# Patient Record
Sex: Male | Born: 1945 | Race: White | Hispanic: No | Marital: Married | Smoking: Former smoker
Health system: Southern US, Community
[De-identification: ages and names within clinical notes are randomized; demographics above are authoritative.]

## PROBLEM LIST (undated history)

## (undated) DIAGNOSIS — I700 Atherosclerosis of aorta: Secondary | ICD-10-CM

## (undated) DIAGNOSIS — I452 Bifascicular block: Secondary | ICD-10-CM

## (undated) DIAGNOSIS — I10 Essential (primary) hypertension: Secondary | ICD-10-CM

## (undated) DIAGNOSIS — Z83518 Family history of other specified eye disorder: Secondary | ICD-10-CM

## (undated) DIAGNOSIS — L719 Rosacea, unspecified: Secondary | ICD-10-CM

## (undated) DIAGNOSIS — Z7982 Long term (current) use of aspirin: Secondary | ICD-10-CM

## (undated) DIAGNOSIS — N529 Male erectile dysfunction, unspecified: Secondary | ICD-10-CM

## (undated) DIAGNOSIS — I4510 Unspecified right bundle-branch block: Secondary | ICD-10-CM

## (undated) DIAGNOSIS — I2510 Atherosclerotic heart disease of native coronary artery without angina pectoris: Secondary | ICD-10-CM

## (undated) DIAGNOSIS — D472 Monoclonal gammopathy: Secondary | ICD-10-CM

## (undated) DIAGNOSIS — N184 Chronic kidney disease, stage 4 (severe): Secondary | ICD-10-CM

## (undated) DIAGNOSIS — I444 Left anterior fascicular block: Secondary | ICD-10-CM

## (undated) DIAGNOSIS — E7800 Pure hypercholesterolemia, unspecified: Secondary | ICD-10-CM

## (undated) DIAGNOSIS — I209 Angina pectoris, unspecified: Secondary | ICD-10-CM

## (undated) DIAGNOSIS — I723 Aneurysm of iliac artery: Secondary | ICD-10-CM

## (undated) DIAGNOSIS — D631 Anemia in chronic kidney disease: Secondary | ICD-10-CM

## (undated) DIAGNOSIS — C679 Malignant neoplasm of bladder, unspecified: Secondary | ICD-10-CM

## (undated) DIAGNOSIS — Z9841 Cataract extraction status, right eye: Secondary | ICD-10-CM

## (undated) DIAGNOSIS — N400 Enlarged prostate without lower urinary tract symptoms: Secondary | ICD-10-CM

## (undated) DIAGNOSIS — N281 Cyst of kidney, acquired: Secondary | ICD-10-CM

## (undated) DIAGNOSIS — I7143 Infrarenal abdominal aortic aneurysm, without rupture: Secondary | ICD-10-CM

## (undated) DIAGNOSIS — D649 Anemia, unspecified: Secondary | ICD-10-CM

## (undated) DIAGNOSIS — I720 Aneurysm of carotid artery: Secondary | ICD-10-CM

## (undated) DIAGNOSIS — C61 Malignant neoplasm of prostate: Secondary | ICD-10-CM

## (undated) DIAGNOSIS — E785 Hyperlipidemia, unspecified: Secondary | ICD-10-CM

## (undated) HISTORY — PX: CORONARY ANGIOPLASTY: SHX604

## (undated) HISTORY — PX: TONSILLECTOMY: SUR1361

## (undated) HISTORY — PX: CARDIAC CATHETERIZATION: SHX172

## (undated) HISTORY — PX: COLONOSCOPY W/ POLYPECTOMY: SHX1380

## (undated) HISTORY — PX: TRANSURETHRAL RESECTION OF PROSTATE: SHX73

## (undated) HISTORY — DX: Bifascicular block: I45.2

## (undated) HISTORY — PX: EYE SURGERY: SHX253

## (undated) HISTORY — PX: OTHER SURGICAL HISTORY: SHX169

---
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
## 2002-04-13 DIAGNOSIS — Z951 Presence of aortocoronary bypass graft: Secondary | ICD-10-CM

## 2002-04-13 HISTORY — PX: CORONARY ARTERY BYPASS GRAFT: SHX141

## 2002-04-13 HISTORY — PX: CARDIAC CATHETERIZATION: SHX172

## 2002-04-13 HISTORY — DX: Presence of aortocoronary bypass graft: Z95.1

## 2004-01-01 ENCOUNTER — Ambulatory Visit: Payer: Self-pay | Admitting: Gastroenterology

## 2007-02-03 ENCOUNTER — Ambulatory Visit: Payer: Self-pay | Admitting: Gastroenterology

## 2007-02-03 DIAGNOSIS — D126 Benign neoplasm of colon, unspecified: Secondary | ICD-10-CM

## 2007-02-03 HISTORY — DX: Benign neoplasm of colon, unspecified: D12.6

## 2008-06-01 ENCOUNTER — Ambulatory Visit: Payer: Self-pay | Admitting: Internal Medicine

## 2008-06-22 ENCOUNTER — Ambulatory Visit: Payer: Self-pay | Admitting: Urology

## 2008-06-28 ENCOUNTER — Ambulatory Visit: Payer: Self-pay | Admitting: Urology

## 2008-09-25 ENCOUNTER — Ambulatory Visit: Payer: Self-pay | Admitting: Urology

## 2008-10-04 ENCOUNTER — Ambulatory Visit: Payer: Self-pay | Admitting: Urology

## 2009-01-24 ENCOUNTER — Ambulatory Visit: Payer: Self-pay | Admitting: Urology

## 2009-08-22 ENCOUNTER — Ambulatory Visit: Payer: Self-pay | Admitting: Urology

## 2009-09-05 ENCOUNTER — Ambulatory Visit: Payer: Self-pay | Admitting: Urology

## 2009-09-07 LAB — PATHOLOGY REPORT

## 2010-01-03 ENCOUNTER — Ambulatory Visit: Payer: Self-pay | Admitting: Gastroenterology

## 2010-01-04 LAB — PATHOLOGY REPORT

## 2011-05-26 ENCOUNTER — Emergency Department: Payer: Self-pay | Admitting: Emergency Medicine

## 2011-07-24 DIAGNOSIS — T782XXA Anaphylactic shock, unspecified, initial encounter: Secondary | ICD-10-CM | POA: Insufficient documentation

## 2011-11-26 DIAGNOSIS — N281 Cyst of kidney, acquired: Secondary | ICD-10-CM | POA: Insufficient documentation

## 2011-11-26 DIAGNOSIS — N400 Enlarged prostate without lower urinary tract symptoms: Secondary | ICD-10-CM | POA: Insufficient documentation

## 2011-11-26 DIAGNOSIS — N529 Male erectile dysfunction, unspecified: Secondary | ICD-10-CM | POA: Insufficient documentation

## 2011-12-10 ENCOUNTER — Ambulatory Visit: Payer: Self-pay | Admitting: Urology

## 2013-01-24 ENCOUNTER — Ambulatory Visit: Payer: Self-pay | Admitting: Urology

## 2013-01-24 LAB — CBC WITH DIFFERENTIAL/PLATELET
Basophil #: 0.1 10*3/uL (ref 0.0–0.1)
Eosinophil %: 1.9 %
HGB: 16.9 g/dL (ref 13.0–18.0)
MCH: 33.8 pg (ref 26.0–34.0)
MCHC: 34.4 g/dL (ref 32.0–36.0)
Monocyte %: 7.6 %
Neutrophil %: 75.4 %
Platelet: 207 10*3/uL (ref 150–440)
RBC: 5.02 10*6/uL (ref 4.40–5.90)
RDW: 12.5 % (ref 11.5–14.5)

## 2013-01-24 LAB — BASIC METABOLIC PANEL
Anion Gap: 6 — ABNORMAL LOW (ref 7–16)
BUN: 15 mg/dL (ref 7–18)
Co2: 27 mmol/L (ref 21–32)
Creatinine: 1.08 mg/dL (ref 0.60–1.30)
EGFR (African American): >60
Sodium: 137 mmol/L (ref 136–145)

## 2013-01-26 ENCOUNTER — Ambulatory Visit: Payer: Self-pay | Admitting: Urology

## 2013-01-27 LAB — PATHOLOGY REPORT

## 2013-02-02 ENCOUNTER — Other Ambulatory Visit: Payer: Self-pay | Admitting: Cardiology

## 2013-02-02 LAB — CK-MB: CK-MB: 4 ng/mL — ABNORMAL HIGH (ref 0.5–3.6)

## 2013-02-02 LAB — TROPONIN I: Troponin-I: 0.09 ng/mL — ABNORMAL HIGH

## 2013-02-03 ENCOUNTER — Ambulatory Visit: Payer: Self-pay | Admitting: Cardiology

## 2013-02-03 HISTORY — PX: CARDIAC CATHETERIZATION: SHX172

## 2013-02-07 DIAGNOSIS — R350 Frequency of micturition: Secondary | ICD-10-CM | POA: Insufficient documentation

## 2013-10-04 DIAGNOSIS — C679 Malignant neoplasm of bladder, unspecified: Secondary | ICD-10-CM | POA: Insufficient documentation

## 2014-01-03 DIAGNOSIS — I519 Heart disease, unspecified: Secondary | ICD-10-CM | POA: Insufficient documentation

## 2014-01-03 DIAGNOSIS — L719 Rosacea, unspecified: Secondary | ICD-10-CM | POA: Insufficient documentation

## 2014-01-03 DIAGNOSIS — Z9889 Other specified postprocedural states: Secondary | ICD-10-CM | POA: Insufficient documentation

## 2014-01-03 DIAGNOSIS — E7800 Pure hypercholesterolemia, unspecified: Secondary | ICD-10-CM | POA: Insufficient documentation

## 2014-01-16 DIAGNOSIS — Z951 Presence of aortocoronary bypass graft: Secondary | ICD-10-CM | POA: Insufficient documentation

## 2014-06-06 NOTE — Op Note (Signed)
PATIENT NAME:  [name], SZCZESNIAK MR#:  [id number] DATE OF BIRTH:  12-21-1945  DATE OF PROCEDURE:  12/10/2011  PREOPERATIVE DIAGNOSIS: History of transitional cell carcinoma of the bladder.   POSTOPERATIVE DIAGNOSIS: History of transitional cell carcinoma of the bladder.   PROCEDURE: Cystoscopy.  SURGEON: John Giovanni, M.D.   ASSISTANT: None.   ANESTHESIA: None.   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 He has had reactions with office cystoscopy and with his last cystoscopy with an anaphylactic type reaction necessitating emergency department visit. He has been tested for numerous allergies and it is felt he is most likely allergic to Cidex. He presents for cystoscopy and gas sterilized flexible cystoscope. He has requested no anesthesia.   DESCRIPTION OF PROCEDURE: The patient was taken to the Operating Room and placed in the supine position. He was then placed in the low lithotomy position. His external genitalia were draped in the usual fashion. Flexible cystoscope was lubricated and inserted under direct vision. The urethra was normal in caliber without stricture. The prostate was remarkable for moderate lateral lobe enlargement. Bladder mucosa was closely inspected and shows no erythema, solid or papillary lesions. On retroflex the anterior bladder was clear. The ureteral orifices were normal appearing with clear efflux. The cystoscope was then removed. The patient was taken to the PAC-U in stable condition. There were no complications. EBL zero.  ____________________________ Ronda Fairly Bernardo Heater, MD scs:slb D: 12/10/2011 11:52:03 ET     T: 12/10/2011 11:57:43 ET        JOB#: [id number] cc: Nicki Reaper C. Bernardo Heater, MD, <Dictator> Abbie Sons MD ELECTRONICALLY SIGNED 12/11/2011 13:56

## 2014-06-09 NOTE — Op Note (Signed)
PATIENT NAME:  [name], [name] MR#:  [id number] DATE OF BIRTH:  [birth year]  DATE OF PROCEDURE:  01/26/2013  PREOPERATIVE DIAGNOSIS: Transitional cell carcinoma of the bladder.   POSTOPERATIVE DIAGNOSIS:  Transitional cell carcinoma of the bladder.   PROCEDURE:  1.  Transurethral resection of bladder tumor (less than 2 cm).  2.  Instillation of intravesical mitomycin.   SURGEON: John Giovanni, M.D.   ASSISTANT: None.   ANESTHESIA: General.   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 He had been approximately 3 years free of recurrence; however, recent cystoscopy was remarkable for a left lateral wall bladder tumor.    DESCRIPTION OF PROCEDURE: The patient was taken to the cystoscopy suite and placed on the table in the supine position where a general anesthetic was administered via an LMA. He was then placed in the low lithotomy position and his external genitalia were prepped and draped in the usual fashion. A timeout was performed per protocol with all in agreement. The urethral meatus would not accept a 27-French continuous-flow resectoscope sheath and then the distal urethra was dilated with Leander Rams sounds from 24 to 30-French. The resectoscope sheath with visual obturator was then placed. There were numerous wide-caliber bulb strictures present. There was moderate lateral lobe enlargement. Panendoscopy was remarkable for an approximately 1 cm papillary tumor on the left lateral wall. No other lesions were noted. This tumor was resected down to superficial muscle with the [facility] resectoscope. Resection site was then cauterized. The specimen was removed. No other lesions were identified. Hemostasis at the resection site was adequate. There was no evidence of perforation. The resectoscope was removed.   A 20-French Foley catheter was placed and was irrigated with return of clear effluent. Mitomycin 40  mg was prepared in 40 mL of sterile water and instilled in the catheter. The catheter was clamped and the mitomycin will remain indwelling for 60 minutes. The patient was taken to the PACU in stable condition. There were no complications.   EBL: Minimal.     ____________________________ Ronda Fairly. Bernardo Heater, MD scs:cs D: 01/26/2013 15:23:05 ET T: 01/26/2013 15:34:17 ET JOB#: [id number]  cc: Nicki Reaper C. Bernardo Heater, MD, <Dictator> Abbie Sons MD ELECTRONICALLY SIGNED 02/02/2013 7:33

## 2015-04-06 ENCOUNTER — Encounter: Payer: Self-pay | Admitting: *Deleted

## 2015-04-09 ENCOUNTER — Ambulatory Visit: Payer: Medicare Other | Admitting: *Deleted

## 2015-04-09 ENCOUNTER — Ambulatory Visit
Admission: RE | Admit: 2015-04-09 | Discharge: 2015-04-09 | Disposition: A | Discharge status: Home Health | Payer: Medicare Other | Source: Ambulatory Visit | Attending: Gastroenterology | Admitting: Gastroenterology

## 2015-04-09 ENCOUNTER — Encounter: Payer: Self-pay | Admitting: *Deleted

## 2015-04-09 ENCOUNTER — Encounter
Admission: RE | Disposition: A | Discharge status: Home Health | Payer: Self-pay | Source: Ambulatory Visit | Attending: Gastroenterology

## 2015-04-09 DIAGNOSIS — N529 Male erectile dysfunction, unspecified: Secondary | ICD-10-CM | POA: Insufficient documentation

## 2015-04-09 DIAGNOSIS — Z86010 Personal history of colonic polyps: Secondary | ICD-10-CM | POA: Insufficient documentation

## 2015-04-09 DIAGNOSIS — Z79899 Other long term (current) drug therapy: Secondary | ICD-10-CM | POA: Diagnosis not present

## 2015-04-09 DIAGNOSIS — Z8551 Personal history of malignant neoplasm of bladder: Secondary | ICD-10-CM | POA: Diagnosis not present

## 2015-04-09 DIAGNOSIS — Z1211 Encounter for screening for malignant neoplasm of colon: Secondary | ICD-10-CM | POA: Insufficient documentation

## 2015-04-09 DIAGNOSIS — Z7982 Long term (current) use of aspirin: Secondary | ICD-10-CM | POA: Diagnosis not present

## 2015-04-09 DIAGNOSIS — L719 Rosacea, unspecified: Secondary | ICD-10-CM | POA: Insufficient documentation

## 2015-04-09 DIAGNOSIS — Z87891 Personal history of nicotine dependence: Secondary | ICD-10-CM | POA: Insufficient documentation

## 2015-04-09 DIAGNOSIS — I2510 Atherosclerotic heart disease of native coronary artery without angina pectoris: Secondary | ICD-10-CM | POA: Insufficient documentation

## 2015-04-09 DIAGNOSIS — I10 Essential (primary) hypertension: Secondary | ICD-10-CM | POA: Insufficient documentation

## 2015-04-09 DIAGNOSIS — Z951 Presence of aortocoronary bypass graft: Secondary | ICD-10-CM | POA: Insufficient documentation

## 2015-04-09 HISTORY — DX: Essential (primary) hypertension: I10

## 2015-04-09 HISTORY — DX: Rosacea, unspecified: L71.9

## 2015-04-09 HISTORY — DX: Atherosclerotic heart disease of native coronary artery without angina pectoris: I25.10

## 2015-04-09 HISTORY — DX: Male erectile dysfunction, unspecified: N52.9

## 2015-04-09 HISTORY — PX: COLONOSCOPY WITH PROPOFOL: SHX5780

## 2015-04-09 HISTORY — DX: Malignant neoplasm of bladder, unspecified: C67.9

## 2015-04-09 SURGERY — COLONOSCOPY WITH PROPOFOL
Anesthesia: General

## 2015-04-09 MED ORDER — PROPOFOL 500 MG/50ML IV EMUL
INTRAVENOUS | Status: DC | PRN
  Administered 2015-04-09: 100 ug/kg/min via INTRAVENOUS

## 2015-04-09 MED ORDER — SODIUM CHLORIDE 0.9 % IV SOLN
INTRAVENOUS | Status: DC
  Administered 2015-04-09 (×2): via INTRAVENOUS

## 2015-04-09 NOTE — H&P (Signed)
Primary Care Physician:  Madelyn Brunner, MD Primary Gastroenterologist:  Dr. Candace Cruise  Pre-Procedure History & Physical: HPI:  [name] is a 70 y.o. male is here for an colonoscopy.  Past Medical History  Diagnosis Date  . Bladder cancer ([place])   . ED (erectile dysfunction)   . Hypertension   . Rosacea     Past Surgical History  Procedure Laterality Date  . Cardiac catheterization    . Heart disease    . Cholesterol    . Colonoscopy w/ polypectomy      Prior to Admission medications   Medication Sig Start Date End Date Taking? Authorizing Provider  aspirin 81 MG tablet Take 81 mg by mouth daily.   Yes Historical Provider, MD  atorvastatin (LIPITOR) 10 MG tablet Take 10 mg by mouth daily.   Yes Historical Provider, MD  b complex vitamins capsule Take 1 capsule by mouth daily.   Yes Historical Provider, MD  calcium & magnesium carbonates (MYLANTA) [id number] MG tablet Take 1 tablet by mouth daily.   Yes Historical Provider, MD  co-enzyme Q-10 30 MG capsule Take 30 mg by mouth 3 (three) times daily.   Yes Historical Provider, MD  folic acid (FOLVITE) 1 MG tablet Take 1 mg by mouth daily.   Yes Historical Provider, MD  isosorbide mononitrate (IMDUR) 30 MG 24 hr tablet Take 30 mg by mouth daily.   Yes Historical Provider, MD  metoprolol tartrate (LOPRESSOR) 25 MG tablet Take 25 mg by mouth 2 (two) times daily.   Yes Historical Provider, MD  metroNIDAZOLE (METROGEL) 0.75 % gel Apply 1 application topically 2 (two) times daily.   Yes Historical Provider, MD  naproxen sodium (ANAPROX) 220 MG tablet Take 220 mg by mouth 2 (two) times daily with a meal.   Yes Historical Provider, MD  nitroGLYCERIN (NITROSTAT) 0.4 MG SL tablet Place 0.4 mg under the tongue every 5 (five) minutes as needed for chest pain.   Yes Historical Provider, MD  omega-3 acid ethyl esters (LOVAZA) 1 g capsule Take by mouth 2 (two) times daily.   Yes Historical Provider, MD  sildenafil (REVATIO) 20 MG tablet Take  20 mg by mouth 3 (three) times daily.   Yes Historical Provider, MD  triamterene-hydrochlorothiazide (MAXZIDE-25) 37.5-25 MG tablet Take 1 tablet by mouth daily.   Yes Historical Provider, MD  vardenafil (LEVITRA) 20 MG tablet Take 20 mg by mouth daily as needed for erectile dysfunction.   Yes Historical Provider, MD  vitamin E 400 UNIT capsule Take 400 Units by mouth daily.   Yes Historical Provider, MD    Allergies as of 04/04/2015  . (Not on File)    History reviewed. No pertinent family history.  Social History   Social History  . Marital Status: Married    Spouse Name: N/A  . Number of Children: N/A  . Years of Education: N/A   Occupational History  . Not on file.   Social History Main Topics  . Smoking status: Former Research scientist (life sciences)  . Smokeless tobacco: Not on file  . Alcohol Use: Not on file  . Drug Use: Not on file  . Sexual Activity: Not on file   Other Topics Concern  . Not on file   Social History Narrative  . No narrative on file    Review of Systems: See HPI, otherwise negative ROS  Physical Exam: There were no vitals taken for this visit. General:   Alert,  pleasant and cooperative in NAD  Head:  Normocephalic and atraumatic. Neck:  Supple; no masses or thyromegaly. Lungs:  Clear throughout to auscultation.    Heart:  Regular rate and rhythm. Abdomen:  Soft, nontender and nondistended. Normal bowel sounds, without guarding, and without rebound.   Neurologic:  Alert and  oriented x4;  grossly normal neurologically.  Impression/Plan: ABUNDIO LEMPERT is here for an colonoscopy to be performed for screening and personal hx of colon polyps  Risks, benefits, limitations, and alternatives regarding  colonoscopy have been reviewed with the patient.  Questions have been answered.  All parties agreeable.   Otelia Hettinger, Lupita Dawn, MD  04/09/2015, 1:11 PM

## 2015-04-09 NOTE — Op Note (Signed)
[facility] Gastroenterology Patient Name: [name] Procedure Date: 04/09/2015 2:01 PM MRN: [id number] Account #: [id number] Date of Birth: 07/31/1945 Admit Type: Outpatient Age: 70 Room: [facility] ENDO ROOM 4 Gender: Male Note Status: Finalized Procedure:            Colonoscopy Indications:          Personal history of colonic polyps Providers:            Lupita Dawn. Candace Cruise, MD Referring MD:         Hewitt Blade. Sarina Ser, MD (Referring MD) Medicines:            Monitored Anesthesia Care Complications:        No immediate complications. Procedure:            Pre-Anesthesia Assessment:                       - Prior to the procedure, a History and Physical was                        performed, and patient medications, allergies and                        sensitivities were reviewed. The patient's tolerance of                        previous anesthesia was reviewed.                       - The risks and benefits of the procedure and the                        sedation options and risks were discussed with the                        patient. All questions were answered and informed                        consent was obtained.                       - After reviewing the risks and benefits, the patient                        was deemed in satisfactory condition to undergo the                        procedure.                       After obtaining informed consent, the colonoscope was                        passed under direct vision. Throughout the procedure,                        the patient's blood pressure, pulse, and oxygen                        saturations were monitored continuously. The  Colonoscope was introduced through the anus and                        advanced to the the cecum, identified by appendiceal                        orifice and ileocecal valve. The colonoscopy was                        performed without difficulty. The patient  tolerated the                        procedure well. The quality of the bowel preparation                        was good. Findings:      The colon (entire examined portion) appeared normal. Impression:           - The entire examined colon is normal.                       - No specimens collected. Recommendation:       - Discharge patient to home.                       - Repeat colonoscopy in 5 years for surveillance.                       - The findings and recommendations were discussed with                        the patient. Procedure Code(s):    --- Professional ---                       [phone], Colonoscopy, flexible; diagnostic, including                        collection of specimen(s) by brushing or washing, when                        performed (separate procedure) Diagnosis Code(s):    --- Professional ---                       Z86.010, Personal history of colonic polyps CPT copyright 2016 American Medical Association. All rights reserved. The codes documented in this report are preliminary and upon coder review may  be revised to meet current compliance requirements. Hulen Luster, MD 04/09/2015 2:15:55 PM This report has been signed electronically. Number of Addenda: 0 Note Initiated On: 04/09/2015 2:01 PM Scope Withdrawal Time: 0 hours 6 minutes 1 second  Total Procedure Duration: 0 hours 8 minutes 31 seconds       [facility]

## 2015-04-09 NOTE — Anesthesia Preprocedure Evaluation (Signed)
Anesthesia Evaluation  Patient identified by MRN, date of birth, ID band Patient awake    Reviewed: Allergy & Precautions, H&P , NPO status , Patient's Chart, lab work & pertinent test results, reviewed documented beta blocker date and time   History of Anesthesia Complications Negative for: history of anesthetic complications  Airway Mallampati: III  TM Distance: >3 FB Neck ROM: full    Dental no notable dental hx. (+) Caps, Poor Dentition   Pulmonary neg pulmonary ROS, former smoker,    Pulmonary exam normal breath sounds clear to auscultation       Cardiovascular Exercise Tolerance: Good hypertension, (-) angina+ CAD and + CABG (4 vessel in 2004)  (-) Past MI and (-) Cardiac Stents Normal cardiovascular exam(-) dysrhythmias (-) Valvular Problems/Murmurs Rhythm:regular Rate:Normal     Neuro/Psych negative neurological ROS  negative psych ROS   GI/Hepatic negative GI ROS, Neg liver ROS,   Endo/Other  negative endocrine ROS  Renal/GU negative Renal ROS  negative genitourinary   Musculoskeletal   Abdominal   Peds  Hematology negative hematology ROS (+)   Anesthesia Other Findings Past Medical History:   Bladder cancer [facility])                                         ED (erectile dysfunction)                                    Hypertension                                                 Rosacea                                                      Coronary artery disease                                      Reproductive/Obstetrics negative OB ROS                             Anesthesia Physical Anesthesia Plan  ASA: III  Anesthesia Plan: General   Post-op Pain Management:    Induction:   Airway Management Planned:   Additional Equipment:   Intra-op Plan:   Post-operative Plan:   Informed Consent: I have reviewed the patients History and Physical, chart, labs and discussed the  procedure including the risks, benefits and alternatives for the proposed anesthesia with the patient or authorized representative who has indicated his/her understanding and acceptance.   Dental Advisory Given  Plan Discussed with: Anesthesiologist, CRNA and Surgeon  Anesthesia Plan Comments:         Anesthesia Quick Evaluation

## 2015-04-09 NOTE — Transfer of Care (Signed)
Immediate Anesthesia Transfer of Care Note  Patient: [name]  Procedure(s) Performed: Procedure(s): COLONOSCOPY WITH PROPOFOL (N/A)  Patient Location: PACU  Anesthesia Type:General  Level of Consciousness: awake, alert  and oriented  Airway & Oxygen Therapy: Patient Spontanous Breathing and Patient connected to nasal cannula oxygen  Post-op Assessment: Report given to RN and Post -op Vital signs reviewed and stable  Post vital signs: Reviewed and stable  Last Vitals:  Filed Vitals:   04/09/15 1322  BP: 132/82  Pulse: 64  Temp: 36.6 C  Resp: 20    Complications: No apparent anesthesia complications

## 2015-04-09 NOTE — Anesthesia Postprocedure Evaluation (Signed)
Anesthesia Post Note  Patient: [name]  Procedure(s) Performed: Procedure(s) (LRB): COLONOSCOPY WITH PROPOFOL (N/A)  Patient location during evaluation: Endoscopy Anesthesia Type: General Level of consciousness: awake and alert Pain management: pain level controlled Vital Signs Assessment: post-procedure vital signs reviewed and stable Respiratory status: spontaneous breathing, nonlabored ventilation, respiratory function stable and patient connected to nasal cannula oxygen Cardiovascular status: blood pressure returned to baseline and stable Postop Assessment: no signs of nausea or vomiting Anesthetic complications: no    Last Vitals:  Filed Vitals:   04/09/15 1438 04/09/15 1450  BP: 128/85 135/85  Pulse: 57 63  Temp:    Resp: 18 17    Last Pain: There were no vitals filed for this visit.               Martha Clan

## 2015-04-10 ENCOUNTER — Encounter: Payer: Self-pay | Admitting: Gastroenterology

## 2016-01-26 ENCOUNTER — Emergency Department: Payer: Medicare Other

## 2016-01-26 ENCOUNTER — Encounter: Payer: Self-pay | Admitting: Emergency Medicine

## 2016-01-26 ENCOUNTER — Emergency Department
Admission: EM | Admit: 2016-01-26 | Discharge: 2016-01-26 | Disposition: A | Discharge status: Home / Self Care | Payer: Medicare Other | Attending: Emergency Medicine | Admitting: Emergency Medicine

## 2016-01-26 DIAGNOSIS — I2510 Atherosclerotic heart disease of native coronary artery without angina pectoris: Secondary | ICD-10-CM | POA: Insufficient documentation

## 2016-01-26 DIAGNOSIS — R0789 Other chest pain: Secondary | ICD-10-CM

## 2016-01-26 DIAGNOSIS — Z87891 Personal history of nicotine dependence: Secondary | ICD-10-CM | POA: Diagnosis not present

## 2016-01-26 DIAGNOSIS — R079 Chest pain, unspecified: Secondary | ICD-10-CM | POA: Insufficient documentation

## 2016-01-26 DIAGNOSIS — Z79899 Other long term (current) drug therapy: Secondary | ICD-10-CM | POA: Insufficient documentation

## 2016-01-26 DIAGNOSIS — I10 Essential (primary) hypertension: Secondary | ICD-10-CM | POA: Diagnosis not present

## 2016-01-26 LAB — BASIC METABOLIC PANEL
ANION GAP: 9 (ref 5–15)
BUN: 23 mg/dL — AB (ref 6–20)
CO2: 25 mmol/L (ref 22–32)
Calcium: 9.5 mg/dL (ref 8.9–10.3)
Chloride: 103 mmol/L (ref 101–111)
Creatinine, Ser: 1.23 mg/dL (ref 0.61–1.24)
GFR calc Af Amer: >60 mL/min (ref >60)
GFR calc non Af Amer: 57 mL/min — ABNORMAL LOW (ref >60)
GLUCOSE: 133 mg/dL — AB (ref 65–99)
POTASSIUM: 4.3 mmol/L (ref 3.5–5.1)
Sodium: 137 mmol/L (ref 135–145)

## 2016-01-26 LAB — CBC WITH DIFFERENTIAL/PLATELET
Basophils Absolute: 0.3 10*3/uL — ABNORMAL HIGH (ref 0–0.1)
Basophils Relative: 2 %
Eosinophils Absolute: 0.2 10*3/uL (ref 0–0.7)
Eosinophils Relative: 2 %
HEMATOCRIT: 53.5 % — AB (ref 40.0–52.0)
Hemoglobin: 18.4 g/dL — ABNORMAL HIGH (ref 13.0–18.0)
LYMPHS ABS: 1.4 10*3/uL (ref 1.0–3.6)
Lymphocytes Relative: 11 %
MCH: 34.4 pg — AB (ref 26.0–34.0)
MCHC: 34.5 g/dL (ref 32.0–36.0)
MCV: 99.8 fL (ref 80.0–100.0)
MONOS PCT: 8 %
Monocytes Absolute: 0.9 10*3/uL (ref 0.2–1.0)
NEUTROS ABS: 9.4 10*3/uL — AB (ref 1.4–6.5)
NEUTROS PCT: 77 %
Platelets: 215 10*3/uL (ref 150–440)
RBC: 5.36 MIL/uL (ref 4.40–5.90)
RDW: 12.2 % (ref 11.5–14.5)
WBC: 12.2 10*3/uL — ABNORMAL HIGH (ref 3.8–10.6)

## 2016-01-26 LAB — TROPONIN I
Troponin I: 0.03 ng/mL (ref <0.03)
Troponin I: <0.03 ng/mL (ref <0.03)

## 2016-01-26 MED ORDER — NITROGLYCERIN 0.4 MG SL SUBL
0.4000 mg | SUBLINGUAL_TABLET | SUBLINGUAL | Status: DC | PRN
  Administered 2016-01-26 (×2): 0.4 mg via SUBLINGUAL
  Filled 2016-01-26: qty 1

## 2016-01-26 MED ORDER — NITROGLYCERIN 0.4 MG SL SUBL: 0.4000 mg | SUBLINGUAL_TABLET | SUBLINGUAL | Status: DC | PRN

## 2016-01-26 NOTE — ED Notes (Signed)
Patient instructed to place nitroglycerin tablet under tongue and let dissolve. Pt swallowed medication. MD [facility] informed.

## 2016-01-26 NOTE — Discharge Instructions (Signed)

## 2016-01-26 NOTE — ED Triage Notes (Signed)
Pt ambulatory to stat desk, report pain to left chest/shoulder, described as discomfort, EMS was at pt's house and recommended pt come in to be evaluated.  Pt in NAD at this time, taken to rm 26.

## 2016-01-26 NOTE — ED Notes (Signed)
Patient reports that he had some relief of chest pain with home dose of nitroglycerin

## 2016-01-26 NOTE — ED Notes (Signed)
Patient transported to X-ray 

## 2016-01-26 NOTE — ED Notes (Signed)
MD Stafford at bedside. 

## 2016-01-26 NOTE — ED Provider Notes (Signed)
Care assumed from Dr. Joni Fears.  Patient with history of coronary disease, having chest pain over the last 24 hours. Now fully relieved after nitroglycerin. Plan of care is to check a second troponin, 9 a.m. This is stable plan to discharge patient is remaining pain-free as he has follow-up set up with his cardiologist Monday.  ----------------------------------------- 9:39 AM on 01/26/2016 -----------------------------------------  Patient resting comfortably. His second troponin is normal, with no evidence of increasing discomfort or pain, and at present reports history comfortable. I reviewed his case, previous history with Dr. Josefa Half who is no some well. Dr. Josefa Half recommends patient be discharged to follow-up on Monday as planned with him in the office. The patient is wife are both agreeable with this plan. I discussed very careful chest pain return precautions with the patient, and he will continue his Imdur and aspirin at home as well as his normal regimen of medications.  Return precautions and treatment recommendations and follow-up discussed with the patient who is agreeable with the plan.    Delman Kitten, MD 01/26/16 [phone]

## 2016-01-26 NOTE — ED Provider Notes (Signed)
[facility] Emergency Department Provider Note  ____________________________________________  Time seen: Approximately 6:23 AM  I have reviewed the triage vital signs and the nursing notes.   HISTORY  Chief Complaint Chest Pain    HPI [name] is a 70 y.o. male reports constant left-sided chest pain described as aching for the past 24 hours. Somewhat waxing and waning, but no aggravating or alleviating factors. Nonradiating. Not pleuritic, not exertional. No shortness of breath diaphoresis or vomiting. No dizziness or syncope. Has been doing his usual cardio exercises recently without significant exertional symptoms. Follows with Dr. Saralyn Pilar, has an appointment in 2 days.  No recent travel, hospitalization surgery. No recent illness.     Past Medical History:  Diagnosis Date  . Bladder cancer ([place])   . Coronary artery disease   . ED (erectile dysfunction)   . Hypertension   . Rosacea      There are no active problems to display for this patient.    Past Surgical History:  Procedure Laterality Date  . CARDIAC CATHETERIZATION    . cholesterol    . COLONOSCOPY W/ POLYPECTOMY    . COLONOSCOPY WITH PROPOFOL N/A 04/09/2015   Procedure: COLONOSCOPY WITH PROPOFOL;  Surgeon: Hulen Luster, MD;  Location: [facility] ENDOSCOPY;  Service: Gastroenterology;  Laterality: N/A;  . CORONARY ANGIOPLASTY    . heart disease    . TONSILLECTOMY       Prior to Admission medications   Medication Sig Start Date End Date Taking? Authorizing Provider  aspirin 81 MG tablet Take 81 mg by mouth daily.    Historical Provider, MD  atorvastatin (LIPITOR) 10 MG tablet Take 10 mg by mouth daily.    Historical Provider, MD  b complex vitamins capsule Take 1 capsule by mouth daily.    Historical Provider, MD  calcium & magnesium carbonates (MYLANTA) [id number] MG tablet Take 1 tablet by mouth daily.    Historical Provider, MD  co-enzyme Q-10 30 MG capsule Take 30 mg by mouth 3  (three) times daily.    Historical Provider, MD  folic acid (FOLVITE) 1 MG tablet Take 1 mg by mouth daily.    Historical Provider, MD  isosorbide mononitrate (IMDUR) 30 MG 24 hr tablet Take 30 mg by mouth daily.    Historical Provider, MD  metoprolol tartrate (LOPRESSOR) 25 MG tablet Take 25 mg by mouth 2 (two) times daily.    Historical Provider, MD  metroNIDAZOLE (METROGEL) 0.75 % gel Apply 1 application topically 2 (two) times daily.    Historical Provider, MD  naproxen sodium (ANAPROX) 220 MG tablet Take 220 mg by mouth 2 (two) times daily with a meal.    Historical Provider, MD  nitroGLYCERIN (NITROSTAT) 0.4 MG SL tablet Place 0.4 mg under the tongue every 5 (five) minutes as needed for chest pain.    Historical Provider, MD  omega-3 acid ethyl esters (LOVAZA) 1 g capsule Take by mouth 2 (two) times daily.    Historical Provider, MD  sildenafil (REVATIO) 20 MG tablet Take 20 mg by mouth 3 (three) times daily.    Historical Provider, MD  triamterene-hydrochlorothiazide (MAXZIDE-25) 37.5-25 MG tablet Take 1 tablet by mouth daily.    Historical Provider, MD  vardenafil (LEVITRA) 20 MG tablet Take 20 mg by mouth daily as needed for erectile dysfunction.    Historical Provider, MD  vitamin E 400 UNIT capsule Take 400 Units by mouth daily.    Historical Provider, MD     Allergies Cidex [glutaral]  History reviewed. No pertinent family history.  Social History Social History  Substance Use Topics  . Smoking status: Former Research scientist (life sciences)  . Smokeless tobacco: Never Used  . Alcohol use Yes    Review of Systems  Constitutional:   No fever or chills.  ENT:   No sore throat. No rhinorrhea. Cardiovascular:   Positive as above chest pain. Respiratory:   No dyspnea or cough. Gastrointestinal:   Negative for abdominal pain, vomiting and diarrhea.  Genitourinary:   Negative for dysuria or difficulty urinating. Musculoskeletal:   Negative for focal pain or swelling Neurological:   Negative for  headaches 10-point ROS otherwise negative.  ____________________________________________   PHYSICAL EXAM:  VITAL SIGNS: ED Triage Vitals  Enc Vitals Group     BP 01/26/16 0609 (!) 158/90     Pulse Rate 01/26/16 0609 68     Resp 01/26/16 0609 19     Temp 01/26/16 0609 97.9 F (36.6 C)     Temp Source 01/26/16 0609 Oral     SpO2 01/26/16 0609 97 %     Weight 01/26/16 0600 203 lb (92.1 kg)     Height 01/26/16 0600 5\' 6"  (1.676 m)     Head Circumference --      Peak Flow --      Pain Score 01/26/16 0600 5     Pain Loc --      Pain Edu? --      Excl. in [place]? --     Vital signs reviewed, nursing assessments reviewed.   Constitutional:   Alert and oriented. Well appearing and in no distress. Eyes:   No scleral icterus. No conjunctival pallor. PERRL. EOMI.  No nystagmus. ENT   Head:   Normocephalic and atraumatic.   Nose:   No congestion/rhinnorhea. No septal hematoma   Mouth/Throat:   MMM, no pharyngeal erythema. No peritonsillar mass.    Neck:   No stridor. No SubQ emphysema. No meningismus. Hematological/Lymphatic/Immunilogical:   No cervical lymphadenopathy. Cardiovascular:   RRR. Symmetric bilateral radial and DP pulses.  No murmurs.  Respiratory:   Normal respiratory effort without tachypnea nor retractions. Breath sounds are clear and equal bilaterally. No wheezes/rales/rhonchi. Chest wall nontender Gastrointestinal:   Soft and nontender. Non distended. There is no CVA tenderness.  No rebound, rigidity, or guarding. Genitourinary:   deferred Musculoskeletal:   Nontender with normal range of motion in all extremities. No joint effusions.  No lower extremity tenderness.  No edema. Neurologic:   Normal speech and language.  CN 2-10 normal. Motor grossly intact. No gross focal neurologic deficits are appreciated.  Skin:    Skin is warm, dry and intact. No rash noted.  No petechiae, purpura, or bullae.  ____________________________________________    LABS  (pertinent positives/negatives) (all labs ordered are listed, but only abnormal results are displayed) Labs Reviewed  BASIC METABOLIC PANEL  TROPONIN I  CBC WITH DIFFERENTIAL/PLATELET   ____________________________________________   EKG  Interpreted by me Sinus rhythm rate of 71, left axis, normal intervals. Right bundle branch block. Inferior Q waves. Normal ST segments and T waves.  ____________________________________________    RADIOLOGY  cxr unremarkable  ____________________________________________   PROCEDURES Procedures  ____________________________________________   INITIAL IMPRESSION / ASSESSMENT AND PLAN / ED COURSE  Pertinent labs & imaging results that were available during my care of the patient were reviewed by me and considered in my medical decision making (see chart for details).  Patient presents with nonspecific chest pain. Unlikely to be cardiac based on duration  of symptoms and lack of exertional component or associated symptoms. Does have risk factors so we'll check troponin 2. If negative and patient rates pain free in the ED fairly suitable for outpatient follow-up at his scheduled appointment in 2 days with Dr. Saralyn Pilar.Considering the patient's symptoms, medical history, and physical examination today, I have low suspicion for ACS, PE, TAD, pneumothorax, carditis, mediastinitis, pneumonia, CHF, or sepsis.       Clinical Course    ____________________________________________   FINAL CLINICAL IMPRESSION(S) / ED DIAGNOSES  Final diagnoses:  None  nonspecific chest pain    New Prescriptions   No medications on file     Portions of this note were generated with dragon dictation software. Dictation errors may occur despite best attempts at proofreading.    Carrie Mew, MD 01/26/16 [phone]

## 2016-01-26 NOTE — ED Notes (Signed)
Pt c/o left and right sided chest pain described as sharp beginning last night and increasing in severity today. PT denies SOB, N/V, dizziness/lightheadedness/weakness, diaphoresis. Pt reports hx of quadruple bypass.   Pt reports taking 2 325 mg Aspirin 90 minutes ago, and daily dose of metoprolol 60 minutes ago

## 2016-12-22 ENCOUNTER — Telehealth: Payer: Self-pay | Admitting: Urology

## 2016-12-22 NOTE — Telephone Encounter (Signed)
That is correct 

## 2016-12-22 NOTE — Telephone Encounter (Signed)
Patient called today and stated that you told him he would need to follow up in January with a cysto and wanted me to double check with you to make sure that was correct??? I made his app for 02-26-16   [facility]

## 2017-02-25 ENCOUNTER — Encounter: Payer: Self-pay | Admitting: Urology

## 2017-02-25 ENCOUNTER — Ambulatory Visit (INDEPENDENT_AMBULATORY_CARE_PROVIDER_SITE_OTHER): Payer: Medicare Other | Admitting: Urology

## 2017-02-25 VITALS — BP 138/82 | HR 68 | Ht 66.0 in | Wt 202.0 lb

## 2017-02-25 DIAGNOSIS — C679 Malignant neoplasm of bladder, unspecified: Secondary | ICD-10-CM | POA: Diagnosis not present

## 2017-02-25 LAB — MICROSCOPIC EXAMINATION
BACTERIA UA: NONE SEEN
Epithelial Cells (non renal): NONE SEEN /hpf (ref 0–10)
WBC UA: NONE SEEN /HPF (ref 0–?)

## 2017-02-25 LAB — URINALYSIS, COMPLETE
BILIRUBIN UA: NEGATIVE
GLUCOSE, UA: NEGATIVE
Ketones, UA: NEGATIVE
Leukocytes, UA: NEGATIVE
Nitrite, UA: NEGATIVE
SPEC GRAV UA: 1.02 (ref 1.005–1.030)
UUROB: 0.2 mg/dL (ref 0.2–1.0)
pH, UA: 7 (ref 5.0–7.5)

## 2017-02-25 MED ORDER — CIPROFLOXACIN HCL 500 MG PO TABS
500.0000 mg | ORAL_TABLET | Freq: Once | ORAL | Status: AC
  Administered 2017-02-25: 500 mg via ORAL

## 2017-02-25 MED ORDER — LIDOCAINE HCL 2 % EX GEL
1.0000 "application " | Freq: Once | CUTANEOUS | Status: AC
  Administered 2017-02-25: 1 via URETHRAL

## 2017-02-25 NOTE — Progress Notes (Signed)
   02/25/17  CC:  Chief Complaint  Patient presents with  . 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 Completed a six-week course of intravesical gemcitabine 10/08/2015.  Blood pressure 138/82, pulse 68, height 5\' 6"  (1.676 m), weight 202 lb (91.6 kg). NED. A&Ox3.    Cystoscopy Procedure Note  Patient identification was confirmed, informed consent was obtained, and patient was prepped using Betadine solution.  Lidocaine jelly was administered per urethral meatus.    Preoperative abx where received prior to procedure.     Pre-Procedure: - Inspection reveals a normal caliber ureteral meatus.  Procedure: The flexible cystoscope was introduced without difficulty - No urethral strictures/lesions are present. - Enlarged prostate, lateral lobe enlargement  - Elevated bladder neck - Bilateral ureteral orifices identified - Bladder mucosa  reveals no ulcers, tumors, or lesions -Prominent hypervascularity - No bladder stones - No trabeculation  Retroflexion shows no abnormalities   Post-Procedure: - Patient tolerated the procedure well  Assessment/ Plan: No evidence of recurrent disease.  Follow-up surveillance cystoscopy 6 months.

## 2017-03-03 ENCOUNTER — Other Ambulatory Visit: Payer: Self-pay | Admitting: Urology

## 2017-03-04 ENCOUNTER — Telehealth: Payer: Self-pay | Admitting: Urology

## 2017-03-04 NOTE — Telephone Encounter (Signed)
Pt would like to know his cytology results.  He is having problems with MyChart and called to report to help desk.  Please give pt a call.

## 2017-03-05 NOTE — Telephone Encounter (Signed)
Pt called back for cytology results again, (he called yesterday) and wants to know if these will be posted on MyChart.

## 2017-03-06 NOTE — Telephone Encounter (Signed)
LMOM for patient to return call.

## 2017-03-06 NOTE — Telephone Encounter (Signed)
Gave patient results for Cytology over the phone and mailed him a copy  Michelle

## 2017-03-06 NOTE — Telephone Encounter (Signed)
Cytology showed mildly atypical cells which based on cystoscopy are not significant.  Since this test is not performed in the [facility] system it will most likely will not be on My Chart.

## 2017-06-11 ENCOUNTER — Encounter: Payer: Self-pay | Admitting: Urology

## 2017-06-16 NOTE — Telephone Encounter (Signed)
Please have patient come by for a urine cytology.

## 2017-06-23 ENCOUNTER — Ambulatory Visit: Payer: Medicare Other

## 2017-06-30 ENCOUNTER — Other Ambulatory Visit: Payer: Self-pay | Admitting: Urology

## 2017-07-01 ENCOUNTER — Telehealth: Payer: Self-pay

## 2017-07-01 NOTE — Telephone Encounter (Signed)
-----   Message from Abbie Sons, MD sent at 07/01/2017  2:34 PM EDT ----- Urine cytology negative for malignant or abnormal appearing cells.  Keep scheduled cystoscopy.

## 2017-07-01 NOTE — Telephone Encounter (Signed)
lmom for pt call back.

## 2017-07-06 NOTE — Telephone Encounter (Signed)
lmom 

## 2017-07-08 ENCOUNTER — Encounter: Payer: Self-pay | Admitting: Urology

## 2017-08-26 ENCOUNTER — Ambulatory Visit (INDEPENDENT_AMBULATORY_CARE_PROVIDER_SITE_OTHER): Payer: Medicare Other | Admitting: Urology

## 2017-08-26 ENCOUNTER — Encounter: Payer: Self-pay | Admitting: Urology

## 2017-08-26 VITALS — BP 113/67 | HR 76 | Resp 16 | Ht 66.0 in | Wt 202.6 lb

## 2017-08-26 DIAGNOSIS — Z8551 Personal history of malignant neoplasm of bladder: Secondary | ICD-10-CM

## 2017-08-26 DIAGNOSIS — C679 Malignant neoplasm of bladder, unspecified: Secondary | ICD-10-CM

## 2017-08-26 LAB — MICROSCOPIC EXAMINATION
Epithelial Cells (non renal): NONE SEEN /hpf (ref 0–10)
WBC UA: NONE SEEN /HPF (ref 0–5)

## 2017-08-26 LAB — URINALYSIS, COMPLETE
Bilirubin, UA: NEGATIVE
Glucose, UA: NEGATIVE
Ketones, UA: NEGATIVE
Leukocytes, UA: NEGATIVE
Nitrite, UA: NEGATIVE
PH UA: 7 (ref 5.0–7.5)
Specific Gravity, UA: 1.02 (ref 1.005–1.030)
UUROB: 0.2 mg/dL (ref 0.2–1.0)

## 2017-08-26 MED ORDER — CIPROFLOXACIN HCL 500 MG PO TABS
500.0000 mg | ORAL_TABLET | Freq: Once | ORAL | Status: AC
  Administered 2017-08-26: 500 mg via ORAL

## 2017-08-26 MED ORDER — LIDOCAINE HCL URETHRAL/MUCOSAL 2 % EX GEL
1.0000 "application " | Freq: Once | CUTANEOUS | Status: AC
  Administered 2017-08-26: 1 via URETHRAL

## 2017-08-28 ENCOUNTER — Encounter: Payer: Self-pay | Admitting: Urology

## 2017-08-28 DIAGNOSIS — Z8551 Personal history of malignant neoplasm of bladder: Secondary | ICD-10-CM | POA: Insufficient documentation

## 2017-08-28 NOTE — Progress Notes (Signed)
   08/28/17  CC:  Chief Complaint  Patient presents with  . 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 Completed a six-week course of intravesical gemcitabine 10/08/2015. He had an isolated episode of hematuria in late April 2019.  A urine cytology was sent in May 2019 and was negative.  He denies recurrent hematuria.   Blood pressure 113/67, pulse 76, resp. rate 16, height 5\' 6"  (1.676 m), weight 202 lb 9.6 oz (91.9 kg), SpO2 96 %. NED. A&Ox3.   No respiratory distress   Abd soft, NT, ND Normal phallus with bilateral descended testicles  Cystoscopy Procedure Note  Patient identification was confirmed, informed consent was obtained, and patient was prepped using Betadine solution.  Lidocaine jelly was administered per urethral meatus.    Preoperative abx where received prior to procedure.     Pre-Procedure: - Inspection reveals a normal caliber ureteral meatus.  Procedure: The flexible cystoscope was introduced without difficulty - No urethral strictures/lesions are present. - Moderate lateral lobe enlargement prostate  - Mild elevation bladder neck - Bilateral ureteral orifices identified - Bladder mucosa  reveals no ulcers, tumors, or lesions.  Much less prominent hypervascularity - No bladder stones - No trabeculation  Retroflexion shows no significant abnormalities   Post-Procedure: - Patient tolerated the procedure well  Assessment/ Plan: No evidence of recurrent tumor.  Follow-up surveillance cystoscopy in 6 months.

## 2017-09-02 DIAGNOSIS — I2510 Atherosclerotic heart disease of native coronary artery without angina pectoris: Secondary | ICD-10-CM | POA: Insufficient documentation

## 2017-09-21 ENCOUNTER — Encounter: Payer: Self-pay | Admitting: Urology

## 2018-01-11 ENCOUNTER — Encounter: Payer: Self-pay | Admitting: Emergency Medicine

## 2018-01-11 ENCOUNTER — Emergency Department
Admission: EM | Admit: 2018-01-11 | Discharge: 2018-01-11 | Disposition: A | Discharge status: Home / Self Care | Payer: Medicare Other | Attending: Emergency Medicine | Admitting: Emergency Medicine

## 2018-01-11 DIAGNOSIS — Y998 Other external cause status: Secondary | ICD-10-CM | POA: Diagnosis not present

## 2018-01-11 DIAGNOSIS — W06XXXA Fall from bed, initial encounter: Secondary | ICD-10-CM | POA: Diagnosis not present

## 2018-01-11 DIAGNOSIS — I10 Essential (primary) hypertension: Secondary | ICD-10-CM | POA: Insufficient documentation

## 2018-01-11 DIAGNOSIS — S0181XA Laceration without foreign body of other part of head, initial encounter: Secondary | ICD-10-CM

## 2018-01-11 DIAGNOSIS — Y92003 Bedroom of unspecified non-institutional (private) residence as the place of occurrence of the external cause: Secondary | ICD-10-CM | POA: Diagnosis not present

## 2018-01-11 DIAGNOSIS — Z8551 Personal history of malignant neoplasm of bladder: Secondary | ICD-10-CM | POA: Insufficient documentation

## 2018-01-11 DIAGNOSIS — Z87891 Personal history of nicotine dependence: Secondary | ICD-10-CM | POA: Insufficient documentation

## 2018-01-11 DIAGNOSIS — I2510 Atherosclerotic heart disease of native coronary artery without angina pectoris: Secondary | ICD-10-CM | POA: Insufficient documentation

## 2018-01-11 DIAGNOSIS — S01511A Laceration without foreign body of lip, initial encounter: Secondary | ICD-10-CM | POA: Diagnosis not present

## 2018-01-11 DIAGNOSIS — Z23 Encounter for immunization: Secondary | ICD-10-CM | POA: Diagnosis not present

## 2018-01-11 DIAGNOSIS — Y939 Activity, unspecified: Secondary | ICD-10-CM | POA: Diagnosis not present

## 2018-01-11 MED ORDER — LIDOCAINE HCL (PF) 1 % IJ SOLN
INTRAMUSCULAR | Status: AC
  Administered 2018-01-11: 5 mL via INTRADERMAL
  Filled 2018-01-11: qty 5

## 2018-01-11 MED ORDER — TETANUS-DIPHTH-ACELL PERTUSSIS 5-2.5-18.5 LF-MCG/0.5 IM SUSP
0.5000 mL | Freq: Once | INTRAMUSCULAR | Status: AC
  Administered 2018-01-11: 0.5 mL via INTRAMUSCULAR
  Filled 2018-01-11: qty 0.5

## 2018-01-11 MED ORDER — LIDOCAINE VISCOUS HCL 2 % MT SOLN
15.0000 mL | Freq: Once | OROMUCOSAL | Status: AC
  Administered 2018-01-11: 15 mL via OROMUCOSAL
  Filled 2018-01-11: qty 15

## 2018-01-11 MED ORDER — LIDOCAINE HCL (PF) 1 % IJ SOLN
5.0000 mL | Freq: Once | INTRAMUSCULAR | Status: AC
  Administered 2018-01-11: 5 mL via INTRADERMAL
  Filled 2018-01-11: qty 5

## 2018-01-11 NOTE — ED Notes (Signed)
NAD noted at time of D/C. Pt denies questions or concerns. Pt ambulatory to the lobby at this time.  

## 2018-01-11 NOTE — ED Notes (Signed)
ED Provider at bedside. 

## 2018-01-11 NOTE — ED Triage Notes (Signed)
Patient states that he rolled out of the bed. Patient with laceration to top lip with bleeding controlled at this time. Patient denies LOC.

## 2018-01-11 NOTE — ED Notes (Addendum)
Pt presents to ED with c/o lip laceration to R upper lip. Bleeding controlled while this RN and EDP at bedside. Pt states rolled out of bed and hit his lip on nightstand, states he cut his lip with his tooth. Laceration is approx 1in long.

## 2018-01-11 NOTE — ED Provider Notes (Addendum)
[facility] Emergency Department Provider Note       Time seen: ----------------------------------------- 7:04 AM on 01/11/2018 -----------------------------------------   I have reviewed the triage vital signs and the nursing notes.  HISTORY   Chief Complaint Laceration    HPI [name] is a 72 y.o. male with a history of bladder cancer, coronary artery disease, erectile dysfunction, hypertension who presents to the ED for lip laceration.  Patient states he rolled out of bed and hit his lip on a nightstand.  He thinks he may have cut his lip with his tooth.  Laceration was noted through the vermilion border.  Past Medical History:  Diagnosis Date  . Bladder cancer ([place])   . Coronary artery disease   . ED (erectile dysfunction)   . Hypertension   . Rosacea     Patient Active Problem List   Diagnosis Date Noted  . Personal history of malignant neoplasm of bladder 08/28/2017    Past Surgical History:  Procedure Laterality Date  . CARDIAC CATHETERIZATION    . cholesterol    . COLONOSCOPY W/ POLYPECTOMY    . COLONOSCOPY WITH PROPOFOL N/A 04/09/2015   Procedure: COLONOSCOPY WITH PROPOFOL;  Surgeon: Hulen Luster, MD;  Location[facility] ENDOSCOPY;  Service: Gastroenterology;  Laterality: N/A;  . CORONARY ANGIOPLASTY    . heart disease    . TONSILLECTOMY    . TRANSURETHRAL RESECTION OF PROSTATE      Allergies Cidex [glutaral]  Social History Social History   Tobacco Use  . Smoking status: Former Research scientist (life sciences)  . Smokeless tobacco: Never Used  Substance Use Topics  . Alcohol use: Yes  . Drug use: No   Review of Systems Constitutional: Negative for fever. ENT: Positive for lip laceration Skin: Positive for lip laceration Neurological: Negative for headaches, focal weakness or numbness.  All systems negative/normal/unremarkable except as stated in the HPI  ____________________________________________   PHYSICAL EXAM:  VITAL SIGNS: ED  Triage Vitals  Enc Vitals Group     BP 01/11/18 0654 (!) 144/82     Pulse Rate 01/11/18 0654 71     Resp 01/11/18 0654 18     Temp 01/11/18 0654 (!) 97.5 F (36.4 C)     Temp Source 01/11/18 0654 Oral     SpO2 01/11/18 0654 95 %     Weight 01/11/18 0654 201 lb (91.2 kg)     Height 01/11/18 0654 5\' 6"  (1.676 m)     Head Circumference --      Peak Flow --      Pain Score 01/11/18 0653 3     Pain Loc --      Pain Edu? --      Excl. in [place]? --    Constitutional: Alert and oriented. Well appearing and in no distress. ENT   Head: Normocephalic and atraumatic.   Nose: No congestion/rhinnorhea.   Mouth/Throat: 2 cm laceration noted through the vermilion border vertically on the right upper lip   Neck: No stridor. Neurologic:  Normal speech and language. No gross focal neurologic deficits are appreciated.  Skin: 2 cm laceration, orbicularis oris muscle is visible does not appear to be injured.  Laceration does involve the vermilion border Psychiatric: Mood and affect are normal. Speech and behavior are normal.  ____________________________________________  ED COURSE:  As part of my medical decision making, I reviewed the following data within the [place] History obtained from family if available, nursing notes, old chart and ekg, as well  as notes from prior ED visits. Patient presented for lip laceration, we will assess with labs and imaging as indicated at this time.   Marland Kitchen.Laceration Repair Date/Time: 01/11/2018 7:11 AM Performed by: Earleen Newport, MD Authorized by: Earleen Newport, MD   Consent:    Consent obtained:  Verbal   Consent given by:  Patient Anesthesia (see MAR for exact dosages):    Anesthesia method:  Local infiltration and nerve block   Local anesthetic:  Lidocaine 1% w/o epi   Block location:  Infraorbital nerve   Block needle gauge:  27 G   Block anesthetic:  Lidocaine 1% w/o epi   Block injection procedure:   Incremental injection   Block outcome:  Anesthesia achieved Laceration details:    Location:  Lip   Lip location:  Upper exterior lip   Length (cm):  2   Depth (mm):  5 Repair type:    Repair type:  Complex Pre-procedure details:    Preparation:  Patient was prepped and draped in usual sterile fashion Exploration:    Contaminated: no   Treatment:    Area cleansed with:  Betadine   Amount of cleaning:  Standard   Irrigation solution:  Sterile saline   Debridement:  None Subcutaneous repair:    Suture size:  6-0   Suture material:  Fast-absorbing gut   Number of sutures:  2 Skin repair:    Repair method:  Sutures   Suture size:  6-0   Number of sutures:  6 Approximation:    Approximation:  Close   V[place] border: well-aligned   Post-procedure details:    Dressing:  Open (no dressing)   Patient tolerance of procedure:  Tolerated well, no immediate complications   ____________________________________________  DIFFERENTIAL DIAGNOSIS   Laceration, contusion  FINAL ASSESSMENT AND PLAN  Lip laceration   Plan: The patient had presented for lip laceration occurred this morning.  Wound was repaired as dictated above with good cosmesis.  He is cleared for outpatient follow-up for suture removal.   Laurence Aly, MD   Note: This note was generated in part or whole with voice recognition software. Voice recognition is usually quite accurate but there are transcription errors that can and very often do occur. I apologize for any typographical errors that were not detected and corrected.     Earleen Newport, MD 01/11/18 8115    Earleen Newport, MD 01/11/18 [phone]

## 2018-01-16 ENCOUNTER — Encounter: Payer: Self-pay | Admitting: Emergency Medicine

## 2018-01-16 ENCOUNTER — Other Ambulatory Visit: Payer: Self-pay

## 2018-01-16 ENCOUNTER — Emergency Department
Admission: EM | Admit: 2018-01-16 | Discharge: 2018-01-16 | Disposition: A | Discharge status: Home / Self Care | Payer: Medicare Other | Attending: Emergency Medicine | Admitting: Emergency Medicine

## 2018-01-16 DIAGNOSIS — W268XXD Contact with other sharp object(s), not elsewhere classified, subsequent encounter: Secondary | ICD-10-CM | POA: Insufficient documentation

## 2018-01-16 DIAGNOSIS — Z7982 Long term (current) use of aspirin: Secondary | ICD-10-CM | POA: Insufficient documentation

## 2018-01-16 DIAGNOSIS — I10 Essential (primary) hypertension: Secondary | ICD-10-CM | POA: Diagnosis not present

## 2018-01-16 DIAGNOSIS — Z8551 Personal history of malignant neoplasm of bladder: Secondary | ICD-10-CM | POA: Diagnosis not present

## 2018-01-16 DIAGNOSIS — Z87891 Personal history of nicotine dependence: Secondary | ICD-10-CM | POA: Insufficient documentation

## 2018-01-16 DIAGNOSIS — Z79899 Other long term (current) drug therapy: Secondary | ICD-10-CM | POA: Diagnosis not present

## 2018-01-16 DIAGNOSIS — I2510 Atherosclerotic heart disease of native coronary artery without angina pectoris: Secondary | ICD-10-CM | POA: Diagnosis not present

## 2018-01-16 DIAGNOSIS — Z4802 Encounter for removal of sutures: Secondary | ICD-10-CM | POA: Diagnosis present

## 2018-01-16 DIAGNOSIS — S01511D Laceration without foreign body of lip, subsequent encounter: Secondary | ICD-10-CM | POA: Insufficient documentation

## 2018-01-16 NOTE — ED Provider Notes (Signed)
[facility] Emergency Department Provider Note ____________________________________________  Time seen: 1040  I have reviewed the triage vital signs and the nursing notes.  HISTORY  Chief Complaint  Suture / Staple Removal  HPI [name] is a 72 y.o. male who presents to the ED for suture removal.  Patient is about 5 days status post suture repair to the upper lip.  He denies any interim complaints.  Past Medical History:  Diagnosis Date  . Bladder cancer ([place])   . Coronary artery disease   . ED (erectile dysfunction)   . Hypertension   . Rosacea     Patient Active Problem List   Diagnosis Date Noted  . Personal history of malignant neoplasm of bladder 08/28/2017    Past Surgical History:  Procedure Laterality Date  . CARDIAC CATHETERIZATION    . cholesterol    . COLONOSCOPY W/ POLYPECTOMY    . COLONOSCOPY WITH PROPOFOL N/A 04/09/2015   Procedure: COLONOSCOPY WITH PROPOFOL;  Surgeon: Hulen Luster, MD;  Location: [facility] ENDOSCOPY;  Service: Gastroenterology;  Laterality: N/A;  . CORONARY ANGIOPLASTY    . heart disease    . TONSILLECTOMY    . TRANSURETHRAL RESECTION OF PROSTATE      Prior to Admission medications   Medication Sig Start Date End Date Taking? Authorizing Provider  aspirin 81 MG tablet Take 81 mg by mouth daily.    [provider]  atorvastatin (LIPITOR) 10 MG tablet Take 10 mg by mouth daily.    [provider]  b complex vitamins capsule Take 1 capsule by mouth daily.    [provider]  calcium & magnesium carbonates (MYLANTA) 294-765 MG tablet Take 1 tablet by mouth daily.    [provider]  Cholecalciferol (VITAMIN D3 PO) Take 900 mg by mouth daily.    [provider]  co-enzyme Q-10 30 MG capsule Take 30 mg by mouth 3 (three) times daily.    [provider]  glucosamine-chondroitin 500-400 MG tablet Take 1 tablet by mouth 2 (two) times daily.    [provider]   isosorbide mononitrate (IMDUR) 30 MG 24 hr tablet Take 30 mg by mouth daily.    [provider]  MAGNESIUM-ZINC PO Take 1 tablet by mouth daily.    [provider]  metoprolol tartrate (LOPRESSOR) 25 MG tablet Take 25 mg by mouth 2 (two) times daily.    [provider]  metroNIDAZOLE (METROGEL) 0.75 % gel Apply 1 application topically 2 (two) times daily.    [provider]  Multiple Vitamin (MULTIVITAMIN) tablet Take 1 tablet by mouth daily.    [provider]  Multiple Vitamins-Minerals (ZINC PO) Take 35 mg by mouth daily.    [provider]  naproxen sodium (ANAPROX) 220 MG tablet Take 220 mg by mouth 2 (two) times daily with a meal.    [provider]  nitroGLYCERIN (NITROSTAT) 0.4 MG SL tablet Place 0.4 mg under the tongue every 5 (five) minutes as needed for chest pain.    [provider]  omega-3 acid ethyl esters (LOVAZA) 1 g capsule Take by mouth 2 (two) times daily.    [provider]  Potassium Gluconate 595 MG CAPS Take 1 capsule by mouth daily. After HT TR exercise    [provider]  Pyridoxine HCl (B-6 PO) Take 53 mg by mouth daily.    [provider]  sildenafil (REVATIO) 20 MG tablet Take 20 mg by mouth 3 (three) times  daily.    [provider]  triamterene-hydrochlorothiazide (MAXZIDE-25) 37.5-25 MG tablet Take 1 tablet by mouth daily.    [provider]  VITAMIN A PO Take 10,500 Units by mouth daily.    [provider]  vitamin C (ASCORBIC ACID) 250 MG tablet Take 250 mg by mouth daily.    [provider]  vitamin E 400 UNIT capsule Take 400 Units by mouth daily.    [provider]    Allergies Cidex [glutaral]  No family history on file.  Social History Social History   Tobacco Use  . Smoking status: Former Research scientist (life sciences)  . Smokeless tobacco: Never Used  Substance Use Topics  . Alcohol use: Yes  . Drug use: No    Review of  Systems  Constitutional: Negative for fever. Eyes: Negative for visual changes. ENT: Negative for sore throat. Cardiovascular: Negative for chest pain. Respiratory: Negative for shortness of breath. Skin: Negative for rash. Upper lip suture repair as above Neurological: Negative for headaches, focal weakness or numbness. ____________________________________________  PHYSICAL EXAM:  VITAL SIGNS: ED Triage Vitals  Enc Vitals Group     BP 01/16/18 0946 (!) 147/80     Pulse Rate 01/16/18 0946 63     Resp 01/16/18 0946 18     Temp 01/16/18 0946 (!) 97.5 F (36.4 C)     Temp Source 01/16/18 0946 Oral     SpO2 01/16/18 0946 96 %     Weight 01/16/18 0945 201 lb 1 oz (91.2 kg)     Height 01/16/18 0945 5\' 6"  (1.676 m)     Head Circumference --      Peak Flow --      Pain Score 01/16/18 0945 0     Pain Loc --      Pain Edu? --      Excl. in [place]? --     Constitutional: Alert and oriented. Well appearing and in no distress. Head: Normocephalic and atraumatic. Eyes: Conjunctivae are normal. Normal extraocular movements Mouth/Throat: Mucous membranes are moist. Upper lip with suture repair. Good wound healing without signs of infection.  Cardiovascular: Normal rate, regular rhythm.  Respiratory: Normal respiratory effort.  Skin:  Skin is warm, dry and intact. No rash noted. ____________________________________________  PROCEDURES  .Suture Removal Date/Time: 01/16/2018 11:21 AM Performed by: Melvenia Needles, PA-C Authorized by: Melvenia Needles, PA-C   Consent:    Consent obtained:  Verbal   Consent given by:  Patient   Risks discussed:  Pain and bleeding Location:    Location:  Mouth   Mouth location:  Upper exterior lip Procedure details:    Wound appearance:  No signs of infection and good wound healing   Number of sutures removed:  6 Post-procedure details:    Post-removal:  No dressing applied   Patient tolerance of procedure:  Tolerated well, no  immediate complications  ____________________________________________  INITIAL IMPRESSION / ASSESSMENT AND PLAN / ED COURSE  Patient with ED evaluation and return visit for suture removal.  Patient denies any interim complaints for suture repair to the upper lip.  6 nylon sutures are removed without difficulty.  Patient is discharged to follow-up with his primary provider as needed. ____________________________________________  FINAL CLINICAL IMPRESSION(S) / ED DIAGNOSES  Final diagnoses:  Visit for suture removal      Carmie End, Dannielle Karvonen, PA-C 01/16/18 1122    Harvest Dark, MD 01/16/18 1456

## 2018-01-16 NOTE — ED Triage Notes (Signed)
Here for suture removal upper lip.

## 2018-01-16 NOTE — ED Triage Notes (Signed)
Patient to ER for stitch removal from upper lip.

## 2018-01-16 NOTE — Discharge Instructions (Signed)
Keep the wound clean and covered with petroleum jelly as needed.

## 2018-01-16 NOTE — ED Notes (Signed)
See triage note  States he is here for suture removal to lip  Healing well

## 2018-02-02 ENCOUNTER — Encounter: Payer: Self-pay | Admitting: Urology

## 2018-02-05 ENCOUNTER — Encounter: Payer: Self-pay | Admitting: Urology

## 2018-02-08 ENCOUNTER — Encounter: Payer: Self-pay | Admitting: Urology

## 2018-02-11 ENCOUNTER — Telehealth: Payer: Self-pay

## 2018-02-11 NOTE — Telephone Encounter (Signed)
Patient called stating that he has experienced two episodes of gross hematuria in the past few days and wanted to know how we would like to proceed.  Patient denies any other symptoms such as fever, chills, dysuria or urgency. He was instructed that with his history of bladder cancer he should keep his follow up cysto appointment next week. If he develops more urinary symptoms he is to call the office for a UA drop off

## 2018-02-20 ENCOUNTER — Encounter: Payer: Self-pay | Admitting: Urology

## 2018-02-24 ENCOUNTER — Encounter: Payer: Self-pay | Admitting: Urology

## 2018-02-24 ENCOUNTER — Ambulatory Visit: Payer: Medicare Other | Admitting: Urology

## 2018-02-24 VITALS — BP 145/74 | HR 64 | Ht 66.0 in | Wt 203.0 lb

## 2018-02-24 DIAGNOSIS — Z8042 Family history of malignant neoplasm of prostate: Secondary | ICD-10-CM

## 2018-02-24 DIAGNOSIS — Z8551 Personal history of malignant neoplasm of bladder: Secondary | ICD-10-CM

## 2018-02-24 LAB — URINALYSIS, COMPLETE
Bilirubin, UA: NEGATIVE
GLUCOSE, UA: NEGATIVE
KETONES UA: NEGATIVE
Nitrite, UA: NEGATIVE
SPEC GRAV UA: 1.025 (ref 1.005–1.030)
Urobilinogen, Ur: 0.2 mg/dL (ref 0.2–1.0)
pH, UA: 6 (ref 5.0–7.5)

## 2018-02-24 LAB — MICROSCOPIC EXAMINATION: Epithelial Cells (non renal): NONE SEEN /hpf (ref 0–10)

## 2018-02-24 NOTE — Progress Notes (Signed)
Patient scheduled for surveillance cystoscopy today.  Urinalysis was cloudy with 11-30 WBCs.  Cystoscopy was canceled and will be rescheduled.  Urine was sent for culture.

## 2018-02-28 ENCOUNTER — Encounter: Payer: Self-pay | Admitting: Urology

## 2018-02-28 LAB — CULTURE, URINE COMPREHENSIVE

## 2018-03-05 ENCOUNTER — Encounter: Payer: Self-pay | Admitting: Urology

## 2018-03-05 ENCOUNTER — Ambulatory Visit (INDEPENDENT_AMBULATORY_CARE_PROVIDER_SITE_OTHER): Payer: Medicare Other | Admitting: Urology

## 2018-03-05 VITALS — BP 119/65 | HR 67 | Ht 66.0 in | Wt 202.4 lb

## 2018-03-05 DIAGNOSIS — Z8551 Personal history of malignant neoplasm of bladder: Secondary | ICD-10-CM

## 2018-03-05 LAB — URINALYSIS, COMPLETE
Bilirubin, UA: NEGATIVE
Glucose, UA: NEGATIVE
Ketones, UA: NEGATIVE
Leukocytes, UA: NEGATIVE
NITRITE UA: NEGATIVE
PH UA: 6.5 (ref 5.0–7.5)
Protein, UA: NEGATIVE
RBC, UA: NEGATIVE
Specific Gravity, UA: 1.02 (ref 1.005–1.030)
UUROB: 0.2 mg/dL (ref 0.2–1.0)

## 2018-03-05 LAB — MICROSCOPIC EXAMINATION
Bacteria, UA: NONE SEEN
Epithelial Cells (non renal): NONE SEEN /hpf (ref 0–10)
WBC UA: NONE SEEN /HPF (ref 0–5)

## 2018-03-05 MED ORDER — TAMSULOSIN HCL 0.4 MG PO CAPS: 0.4000 mg | ORAL_CAPSULE | Freq: Every day | ORAL | 1 refills | Status: DC

## 2018-03-05 NOTE — Progress Notes (Signed)
   03/05/18  CC:  Chief Complaint  Patient presents with  . 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 Completed a six-week course of intravesical gemcitabine 10/08/2015.  No recurrences since that time.  Cystoscopy canceled earlier this month secondary to pyuria.  Urine culture was negative.  Blood pressure 119/65, pulse 67, height 5\' 6"  (1.676 m), weight 202 lb 6.4 oz (91.8 kg). NED. A&Ox3.   No respiratory distress   Abd soft, NT, ND Normal phallus with bilateral descended testicles  Cystoscopy Procedure Note  Patient identification was confirmed, informed consent was obtained, and patient was prepped using Betadine solution.  Lidocaine jelly was administered per urethral meatus.     Pre-Procedure: - Inspection reveals a normal caliber urethral meatus.  Procedure: The flexible cystoscope was introduced without difficulty - No urethral strictures/lesions are present. - Moderate lateral lobe enlargement prostate  - Mild elevation bladder neck - Bilateral ureteral orifices identified - Bladder mucosa  reveals no ulcers, tumors, or lesions.    Mild hypervascularity posterior wall-significantly improved from prior cystoscopies - No bladder stones - No trabeculation  Retroflexion shows no significant abnormalities   Post-Procedure: - Patient tolerated the procedure well  Assessment/ Plan: No evidence of recurrent bladder tumor.  Urine sent for cytology.  His bladder did appear full upon introduction of the cystoscope.  Recommend a nurse visit/bladder scan approximately 4 weeks.  Start on an alpha-blocker if he has incomplete emptying.  Urine cytology sent.  Surveillance cystoscopy 6 months   Abbie Sons, MD

## 2018-03-08 ENCOUNTER — Encounter: Payer: Self-pay | Admitting: Urology

## 2018-03-10 ENCOUNTER — Other Ambulatory Visit: Payer: Self-pay | Admitting: Urology

## 2018-03-11 ENCOUNTER — Other Ambulatory Visit: Payer: Medicare Other | Admitting: Urology

## 2018-03-11 ENCOUNTER — Encounter: Payer: Self-pay | Admitting: Urology

## 2018-03-11 DIAGNOSIS — R972 Elevated prostate specific antigen [PSA]: Secondary | ICD-10-CM | POA: Insufficient documentation

## 2018-03-12 ENCOUNTER — Other Ambulatory Visit: Payer: Self-pay | Admitting: Sports Medicine

## 2018-03-12 ENCOUNTER — Telehealth: Payer: Self-pay | Admitting: Urology

## 2018-03-12 DIAGNOSIS — M76892 Other specified enthesopathies of left lower limb, excluding foot: Secondary | ICD-10-CM

## 2018-03-12 DIAGNOSIS — R972 Elevated prostate specific antigen [PSA]: Secondary | ICD-10-CM

## 2018-03-12 DIAGNOSIS — M25551 Pain in right hip: Secondary | ICD-10-CM

## 2018-03-12 DIAGNOSIS — M25552 Pain in left hip: Secondary | ICD-10-CM

## 2018-03-12 DIAGNOSIS — M25562 Pain in left knee: Principal | ICD-10-CM

## 2018-03-12 DIAGNOSIS — M25561 Pain in right knee: Secondary | ICD-10-CM

## 2018-03-12 DIAGNOSIS — M76891 Other specified enthesopathies of right lower limb, excluding foot: Secondary | ICD-10-CM

## 2018-03-12 MED ORDER — DOXYCYCLINE HYCLATE 100 MG PO CAPS: 100.0000 mg | ORAL_CAPSULE | Freq: Two times a day (BID) | ORAL | 0 refills | Status: AC

## 2018-03-12 NOTE — Progress Notes (Unsigned)
Urine cytology showed no cells suspicious for malignancy.  He was noted to have bacteria and inflammatory cells.  This may also be a cause of his elevated PSA.  In addition to the tamsulosin that was sent would also recommend starting antibiotics.  Rx was sent to pharmacy.  Keep follow-up as scheduled.

## 2018-03-12 NOTE — Telephone Encounter (Signed)
Pt has questions about RX that was sent in for Doxy.

## 2018-03-12 NOTE — Telephone Encounter (Signed)
Pt called office and I read message from [facility].  He will start abx.

## 2018-03-27 ENCOUNTER — Ambulatory Visit
Admission: RE | Admit: 2018-03-27 | Discharge: 2018-03-27 | Disposition: A | Discharge status: Home / Self Care | Payer: Medicare Other | Source: Ambulatory Visit | Attending: Sports Medicine | Admitting: Sports Medicine

## 2018-03-27 DIAGNOSIS — M25552 Pain in left hip: Secondary | ICD-10-CM | POA: Insufficient documentation

## 2018-03-27 DIAGNOSIS — M76891 Other specified enthesopathies of right lower limb, excluding foot: Secondary | ICD-10-CM

## 2018-03-27 DIAGNOSIS — M25561 Pain in right knee: Secondary | ICD-10-CM | POA: Diagnosis present

## 2018-03-27 DIAGNOSIS — M25562 Pain in left knee: Secondary | ICD-10-CM | POA: Diagnosis present

## 2018-03-27 DIAGNOSIS — R972 Elevated prostate specific antigen [PSA]: Secondary | ICD-10-CM | POA: Diagnosis present

## 2018-03-27 DIAGNOSIS — M76892 Other specified enthesopathies of left lower limb, excluding foot: Secondary | ICD-10-CM | POA: Diagnosis present

## 2018-03-27 DIAGNOSIS — M25551 Pain in right hip: Secondary | ICD-10-CM | POA: Diagnosis present

## 2018-03-29 ENCOUNTER — Encounter: Payer: Self-pay | Admitting: Urology

## 2018-04-01 ENCOUNTER — Encounter: Payer: Self-pay | Admitting: Urology

## 2018-04-05 ENCOUNTER — Other Ambulatory Visit: Payer: Self-pay | Admitting: Urology

## 2018-04-05 DIAGNOSIS — R972 Elevated prostate specific antigen [PSA]: Secondary | ICD-10-CM

## 2018-04-05 NOTE — Progress Notes (Signed)
Patient is scheduled for lab visit on 2/19.  If his urinalysis does not show evidence of infection go ahead and repeat the PSA.  Orders were entered.

## 2018-04-07 ENCOUNTER — Other Ambulatory Visit: Payer: Self-pay

## 2018-04-07 ENCOUNTER — Ambulatory Visit (INDEPENDENT_AMBULATORY_CARE_PROVIDER_SITE_OTHER): Payer: Medicare Other

## 2018-04-07 ENCOUNTER — Telehealth: Payer: Self-pay

## 2018-04-07 DIAGNOSIS — R972 Elevated prostate specific antigen [PSA]: Secondary | ICD-10-CM | POA: Diagnosis not present

## 2018-04-07 DIAGNOSIS — Z87440 Personal history of urinary (tract) infections: Secondary | ICD-10-CM

## 2018-04-07 LAB — BLADDER SCAN AMB NON-IMAGING

## 2018-04-07 NOTE — Telephone Encounter (Signed)
Pt questions if he needs to continue taking tamsulosion as his most recent PVR was 52mL. Please advise.

## 2018-04-07 NOTE — Progress Notes (Signed)
Patient presents in clinic today for a follow up PVR. Bladder Scan is 52mL. Allowed pt to leave, will inform provider. Pt to RTC as scheduled.

## 2018-04-08 LAB — PSA: Prostate Specific Ag, Serum: 17.3 ng/mL — ABNORMAL HIGH (ref 0.0–4.0)

## 2018-04-08 NOTE — Telephone Encounter (Signed)
Okay to start tamsulosin.  He may restart if voiding symptoms worsen after discontinuing.

## 2018-04-11 ENCOUNTER — Encounter: Payer: Self-pay | Admitting: Urology

## 2018-04-12 ENCOUNTER — Encounter: Payer: Self-pay | Admitting: Urology

## 2018-04-12 ENCOUNTER — Telehealth: Payer: Self-pay | Admitting: Family Medicine

## 2018-04-12 ENCOUNTER — Telehealth: Payer: Self-pay | Admitting: Urology

## 2018-04-12 NOTE — Telephone Encounter (Signed)
Patient notified and Clearance letter has been sent to stop Aspirin for 7 days. Please schedule the Biopsy

## 2018-04-12 NOTE — Telephone Encounter (Signed)
Wants to know if he should stop taking Isosorbide 7 days prior to surgery, also if he could be rescheduled sooner if possible. He requests call back ASAP.

## 2018-04-12 NOTE — Telephone Encounter (Signed)
See my chart message

## 2018-04-12 NOTE — Telephone Encounter (Signed)
-----   Message from Abbie Sons, MD sent at 04/10/2018 10:02 PM EST ----- PSA was 17.3.  Recc sched prostate bx

## 2018-04-13 NOTE — Telephone Encounter (Signed)
Spoke to patient and informed him the only medication he needs to stop is Aspirin.

## 2018-04-14 ENCOUNTER — Encounter: Payer: Self-pay | Admitting: Urology

## 2018-04-23 ENCOUNTER — Encounter: Payer: Self-pay | Admitting: Urology

## 2018-04-23 ENCOUNTER — Other Ambulatory Visit: Payer: Self-pay | Admitting: Urology

## 2018-04-23 ENCOUNTER — Ambulatory Visit (INDEPENDENT_AMBULATORY_CARE_PROVIDER_SITE_OTHER): Payer: Medicare Other | Admitting: Urology

## 2018-04-23 VITALS — BP 127/71 | HR 64 | Ht 66.0 in | Wt 202.0 lb

## 2018-04-23 DIAGNOSIS — R972 Elevated prostate specific antigen [PSA]: Secondary | ICD-10-CM | POA: Diagnosis not present

## 2018-04-23 MED ORDER — LEVOFLOXACIN 500 MG PO TABS
500.0000 mg | ORAL_TABLET | Freq: Once | ORAL | Status: AC
  Administered 2018-04-23: 500 mg via ORAL

## 2018-04-23 MED ORDER — GENTAMICIN SULFATE 40 MG/ML IJ SOLN
80.0000 mg | Freq: Once | INTRAMUSCULAR | Status: AC
  Administered 2018-04-23: 80 mg via INTRAMUSCULAR

## 2018-04-23 NOTE — Patient Instructions (Signed)

## 2018-04-23 NOTE — Progress Notes (Signed)
Prostate Biopsy Procedure   Informed consent was obtained after discussing risks/benefits of the procedure.  A time out was performed to ensure correct patient identity.  Pre-Procedure: - Last PSA Level: 17.3; 04/07/2018.  DRE benign - Gentamicin given prophylactically - Levaquin 500 mg administered PO -Transrectal Ultrasound performed revealing a 49 gm prostate -No significant hypoechoic or median lobe noted  Procedure: - Prostate block performed using 10 cc 1% lidocaine and biopsies taken from sextant areas, a total of 12 under ultrasound guidance.  Post-Procedure: - Patient tolerated the procedure well - He was counseled to seek immediate medical attention if experiences any severe pain, significant bleeding, or fevers - Return in one week to discuss biopsy results   John Giovanni, MD

## 2018-04-28 LAB — PROSTATE BIOPSIES

## 2018-04-29 ENCOUNTER — Other Ambulatory Visit: Payer: Self-pay | Admitting: Urology

## 2018-05-04 ENCOUNTER — Encounter: Payer: Self-pay | Admitting: Urology

## 2018-05-05 ENCOUNTER — Telehealth: Payer: Self-pay | Admitting: Urology

## 2018-05-05 DIAGNOSIS — C61 Malignant neoplasm of prostate: Secondary | ICD-10-CM

## 2018-05-05 NOTE — Telephone Encounter (Signed)
I contacted Mr. [name] on 05/04/2018 regarding his prostate biopsy results.  He did have 9/12 cores positive for adenocarcinoma the prostate with variable Gleason score ranging from 3+ 4 to 4 +4.  He did have a pelvic MRI which showed no evidence of extracapsular extension or pelvic adenopathy.  He was informed this is considered high risk prostate cancer and I did not recommend surveillance.  Options were discussed including radiation plus ADT and radical prostatectomy.  There is a higher chance he does have microscopic extracapsular extension and radical prostatectomy may not be curative.  Will schedule a bone scan.  We will go ahead and set up appointment with radiation oncology.  If he is interested in discussing radical prostatectomy with 1 of my partners that appointment will also be arranged.

## 2018-05-06 ENCOUNTER — Encounter: Payer: Self-pay | Admitting: Radiation Oncology

## 2018-05-06 ENCOUNTER — Encounter: Payer: Self-pay | Admitting: Urology

## 2018-05-07 ENCOUNTER — Telehealth: Payer: Self-pay | Admitting: Urology

## 2018-05-07 NOTE — Telephone Encounter (Signed)
I spoke with the patient today and explained that we can not just schedule him, that we will try and get  Him scheduled as soon as we can, I have sent an email to the scheduling dept and asked them to call him and try to get him on the schedule for the next available date that we have. I explained that we were doing everything we could do get him taken care of and that we understood his concerns.   Sharyn Lull

## 2018-05-07 NOTE — Telephone Encounter (Signed)
A bone scan cannot be ordered stat.  Sharyn Lull talk to the patient earlier today and the bone scan has been scheduled for next week.

## 2018-05-07 NOTE — Telephone Encounter (Signed)
Pt called office and [place] told him [facility] needed to put in order for a bone scan STAT.  Please call pt to advise.

## 2018-05-12 ENCOUNTER — Other Ambulatory Visit: Payer: Self-pay

## 2018-05-12 ENCOUNTER — Encounter
Admission: RE | Admit: 2018-05-12 | Discharge: 2018-05-12 | Disposition: A | Discharge status: Home / Self Care | Payer: Medicare Other | Source: Ambulatory Visit | Attending: Urology | Admitting: Urology

## 2018-05-12 DIAGNOSIS — C61 Malignant neoplasm of prostate: Secondary | ICD-10-CM | POA: Insufficient documentation

## 2018-05-12 MED ORDER — TECHNETIUM TC 99M MEDRONATE IV KIT
20.0000 | PACK | Freq: Once | INTRAVENOUS | Status: AC | PRN
  Administered 2018-05-12: 24.084 via INTRAVENOUS

## 2018-05-13 ENCOUNTER — Other Ambulatory Visit: Payer: Self-pay

## 2018-05-13 ENCOUNTER — Telehealth: Payer: Self-pay | Admitting: Urology

## 2018-05-13 ENCOUNTER — Encounter: Payer: Self-pay | Admitting: Urology

## 2018-05-13 ENCOUNTER — Telehealth: Payer: Self-pay

## 2018-05-13 NOTE — Telephone Encounter (Signed)
Called patient to do his travel screening and he stated that he had spoken to you in reference to his appointment tomorrow with Dr. Baruch Gouty. He stated that he is still waiting for an answer from Dr. Bernardo Heater if he needed to see Dr. Baruch Gouty or not. Patient wanted me to let you know that if you hear anything, to please call him. Thank you.

## 2018-05-13 NOTE — Telephone Encounter (Signed)
Yes he definitely needs to keep his appointment with Dr. Baruch Gouty in radiation oncology.

## 2018-05-13 NOTE — Telephone Encounter (Signed)
Pt called and would like a call back to clarify if he needs to keep his appt w/ Dr Baruch Gouty tomorrow. Please Advise.

## 2018-05-13 NOTE — Telephone Encounter (Signed)
Informed patient as instructed-patient verbalized understanding.  

## 2018-05-14 ENCOUNTER — Other Ambulatory Visit: Payer: Self-pay

## 2018-05-14 ENCOUNTER — Encounter: Payer: Self-pay | Admitting: Urology

## 2018-05-14 ENCOUNTER — Encounter: Payer: Self-pay | Admitting: Radiation Oncology

## 2018-05-14 ENCOUNTER — Ambulatory Visit
Admission: RE | Admit: 2018-05-14 | Discharge: 2018-05-14 | Disposition: A | Discharge status: Home / Self Care | Payer: Medicare Other | Source: Ambulatory Visit | Attending: Radiation Oncology | Admitting: Radiation Oncology

## 2018-05-14 VITALS — BP 137/78 | HR 65 | Temp 96.5°F | Resp 18 | Wt 200.2 lb

## 2018-05-14 DIAGNOSIS — C61 Malignant neoplasm of prostate: Secondary | ICD-10-CM | POA: Diagnosis present

## 2018-05-14 DIAGNOSIS — Z8042 Family history of malignant neoplasm of prostate: Secondary | ICD-10-CM | POA: Insufficient documentation

## 2018-05-14 DIAGNOSIS — N529 Male erectile dysfunction, unspecified: Secondary | ICD-10-CM | POA: Insufficient documentation

## 2018-05-14 DIAGNOSIS — I2510 Atherosclerotic heart disease of native coronary artery without angina pectoris: Secondary | ICD-10-CM | POA: Diagnosis not present

## 2018-05-14 DIAGNOSIS — Z7982 Long term (current) use of aspirin: Secondary | ICD-10-CM | POA: Insufficient documentation

## 2018-05-14 DIAGNOSIS — Z87891 Personal history of nicotine dependence: Secondary | ICD-10-CM | POA: Insufficient documentation

## 2018-05-14 DIAGNOSIS — Z8551 Personal history of malignant neoplasm of bladder: Secondary | ICD-10-CM | POA: Insufficient documentation

## 2018-05-14 DIAGNOSIS — I10 Essential (primary) hypertension: Secondary | ICD-10-CM | POA: Insufficient documentation

## 2018-05-14 DIAGNOSIS — Z79899 Other long term (current) drug therapy: Secondary | ICD-10-CM | POA: Insufficient documentation

## 2018-05-14 NOTE — Consult Note (Signed)
NEW PATIENT EVALUATION  Name: [name]  MRN: [id number]  Date:   05/14/2018     DOB: May 15, 1945   This 73 y.o. 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  REFERRING PHYSICIAN: Abbie Sons, MD  CHIEF COMPLAINT:  Chief Complaint  Patient presents with  . Prostate Cancer    Initial consultation    DIAGNOSIS: The encounter diagnosis was Malignant neoplasm of prostate ([place]).   PREVIOUS INVESTIGATIONS:  Bone scan evidence MRI scan of pelvis reviewed Pathology reports reviewed Clinical notes reviewed  HPI: patient is a 73 year old male with family history with father with prostate cancerwould not a PSA in several years. Passing of a close friend for prostate cancer prompted a PSA which turned out to be 17.22 March 2018. His digital rectal exam was unremarkable. This prompt transrectal ultrasound-guided biopsy showing a 9 out of 12 cores positive for adenocarcinoma mostly Gleason 7 (4+3). The was 1 Gleason 8 (4+4) core biopsy. Patient had a bone scan which showed no evidence to suggest metastatic disease. MRI scan of his pelvis for a partial tear of his proximal hamstring showed mild prostatomegaly with reduced T2 signal peripheral zone on the left mid gland with no evidence of my review of lymphadenopathy in the pelvis.patient has nocturia 2 nose specific urgency or frequency of urination. He is now referred to radiation oncology for consideration and opinion.  PLANNED TREATMENT REGIMEN: I MRT ration therapy to prostate and pelvic nodes  PAST MEDICAL HISTORY:  has a past medical history of Bladder cancer ([place]), Coronary artery disease, ED (erectile dysfunction), Hypertension, and Rosacea.    PAST SURGICAL HISTORY:  Past Surgical History:  Procedure Laterality Date  . CARDIAC CATHETERIZATION    . cholesterol    . COLONOSCOPY W/ POLYPECTOMY    . COLONOSCOPY WITH PROPOFOL N/A 04/09/2015    Procedure: COLONOSCOPY WITH PROPOFOL;  Surgeon: Hulen Luster, MD;  Location[facility] ENDOSCOPY;  Service: Gastroenterology;  Laterality: N/A;  . CORONARY ANGIOPLASTY    . heart disease    . TONSILLECTOMY    . TRANSURETHRAL RESECTION OF PROSTATE      FAMILY HISTORY: family history includes Cancer in his father; Prostate cancer in his father.  SOCIAL HISTORY:  reports that he has quit smoking. He has never used smokeless tobacco. He reports current alcohol use. He reports that he does not use drugs.  ALLERGIES: Cidex [glutaral]  MEDICATIONS:  Current Outpatient Medications  Medication Sig Dispense Refill  . aspirin 81 MG tablet Take 81 mg by mouth daily.    Marland Kitchen atorvastatin (LIPITOR) 10 MG tablet Take 10 mg by mouth daily.    Marland Kitchen b complex vitamins capsule Take 1 capsule by mouth daily.    . calcium & magnesium carbonates (MYLANTA) 660-630 MG tablet Take 1 tablet by mouth daily.    . Cholecalciferol (VITAMIN D3 PO) Take 900 mg by mouth daily.    Marland Kitchen co-enzyme Q-10 30 MG capsule Take 30 mg by mouth 3 (three) times daily.    Marland Kitchen glucosamine-chondroitin 500-400 MG tablet Take 1 tablet by mouth 2 (two) times daily.    . isosorbide mononitrate (IMDUR) 30 MG 24 hr tablet Take 30 mg by mouth daily.    Marland Kitchen MAGNESIUM-ZINC PO Take 1 tablet by mouth daily.    . metoprolol tartrate (LOPRESSOR) 25 MG tablet Take 25 mg by mouth 2 (two) times daily.    . metroNIDAZOLE (METROGEL) 0.75 %  gel Apply 1 application topically 2 (two) times daily.    . Multiple Vitamin (MULTIVITAMIN) tablet Take 1 tablet by mouth daily.    . Multiple Vitamins-Minerals (ZINC PO) Take 35 mg by mouth daily.    . naproxen sodium (ANAPROX) 220 MG tablet Take 220 mg by mouth 2 (two) times daily with a meal.    . nitroGLYCERIN (NITROSTAT) 0.4 MG SL tablet Place 0.4 mg under the tongue every 5 (five) minutes as needed for chest pain.    Marland Kitchen omega-3 acid ethyl esters (LOVAZA) 1 g capsule Take by mouth 2 (two) times daily.    . Potassium Gluconate 595  MG CAPS Take 1 capsule by mouth daily. After HT TR exercise    . Pyridoxine HCl (B-6 PO) Take 53 mg by mouth daily.    . sildenafil (REVATIO) 20 MG tablet Take 20 mg by mouth 3 (three) times daily.    . tamsulosin (FLOMAX) 0.4 MG CAPS capsule Take 1 capsule (0.4 mg total) by mouth daily. 30 capsule 1  . triamterene-hydrochlorothiazide (MAXZIDE-25) 37.5-25 MG tablet Take 1 tablet by mouth daily.    Marland Kitchen VITAMIN A PO Take 10,500 Units by mouth daily.    . vitamin C (ASCORBIC ACID) 250 MG tablet Take 250 mg by mouth daily.    . vitamin E 400 UNIT capsule Take 400 Units by mouth daily.     No current facility-administered medications for this encounter.     ECOG PERFORMANCE STATUS:  0 - Asymptomatic  REVIEW OF SYSTEMS:  Patient denies any weight loss, fatigue, weakness, fever, chills or night sweats. Patient denies any loss of vision, blurred vision. Patient denies any ringing  of the ears or hearing loss. No irregular heartbeat. Patient denies heart murmur or history of fainting. Patient denies any chest pain or pain radiating to her upper extremities. Patient denies any shortness of breath, difficulty breathing at night, cough or hemoptysis. Patient denies any swelling in the lower legs. Patient denies any nausea vomiting, vomiting of blood, or coffee ground material in the vomitus. Patient denies any stomach pain. Patient states has had normal bowel movements no significant constipation or diarrhea. Patient denies any dysuria, hematuria or significant nocturia. Patient denies any problems walking, swelling in the joints or loss of balance. Patient denies any skin changes, loss of hair or loss of weight. Patient denies any excessive worrying or anxiety or significant depression. Patient denies any problems with insomnia. Patient denies excessive thirst, polyuria, polydipsia. Patient denies any swollen glands, patient denies easy bruising or easy bleeding. Patient denies any recent infections, allergies or  URI. Patient "s visual fields have not changed significantly in recent time.    PHYSICAL EXAM: BP 137/78 (BP Location: Left Arm, Patient Position: Sitting)   Pulse 65   Temp (!) 96.5 F (35.8 C) (Tympanic)   Resp 18   Wt 200 lb 2.8 oz (90.8 kg)   BMI 32.31 kg/m  On rectal exam prostate is smooth without evidence of nodularity or mass. Sulcus is preserved bilaterally no other rectal abnormalities identified. Well-developed well-nourished patient in NAD. HEENT reveals PERLA, EOMI, discs not visualized.  Oral cavity is clear. No oral mucosal lesions are identified. Neck is clear without evidence of cervical or supraclavicular adenopathy. Lungs are clear to A&P. Cardiac examination is essentially unremarkable with regular rate and rhythm without murmur rub or thrill. Abdomen is benign with no organomegaly or masses noted. Motor sensory and DTR levels are equal and symmetric in the upper and lower extremities.  Cranial nerves II through XII are grossly intact. Proprioception is intact. No peripheral adenopathy or edema is identified. No motor or sensory levels are noted. Crude visual fields are within normal range.  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 He has an 82% chance of extracapsular extension based on his parameters as well as a 22% chance of lymph node involvement. Based on his age previous medical history of bypass surgery and stage of disease I believe he would best be served by radiation therapy to both his prostate and pelvic nodes. I would plan on delivering 8000 cGy to his prostate and 5400 cGy to his pelvic nodes using IM RT dose painting technique. Risks and benefits of radiation including increased lower urinary tract symptoms  diarrhea fatigue alteration of blood counts and skin reaction all were reviewed with the patient.I would also like the patient's suppressed with angina deprivation therapy for at least a year and a half to 2 years and of asked Dr. Bernardo Heater office to start that as soon as possible. I personally set up and ordered CT simulation for next week. Patient comprehend my treatment plan well.  I would like to take this opportunity to thank you for allowing me to participate in the care of your patient.[name], MD

## 2018-05-17 ENCOUNTER — Encounter: Payer: Self-pay | Admitting: Urology

## 2018-05-17 ENCOUNTER — Other Ambulatory Visit: Payer: Self-pay

## 2018-05-18 ENCOUNTER — Other Ambulatory Visit: Payer: Self-pay

## 2018-05-18 ENCOUNTER — Ambulatory Visit
Admission: RE | Admit: 2018-05-18 | Discharge: 2018-05-18 | Disposition: A | Discharge status: Home / Self Care | Payer: Medicare Other | Source: Ambulatory Visit | Attending: Radiation Oncology | Admitting: Radiation Oncology

## 2018-05-18 ENCOUNTER — Other Ambulatory Visit: Payer: Self-pay | Admitting: *Deleted

## 2018-05-18 DIAGNOSIS — Z8551 Personal history of malignant neoplasm of bladder: Secondary | ICD-10-CM | POA: Diagnosis not present

## 2018-05-18 DIAGNOSIS — Z510 Encounter for antineoplastic radiation therapy: Secondary | ICD-10-CM | POA: Diagnosis not present

## 2018-05-18 DIAGNOSIS — N529 Male erectile dysfunction, unspecified: Secondary | ICD-10-CM | POA: Diagnosis not present

## 2018-05-18 DIAGNOSIS — I2510 Atherosclerotic heart disease of native coronary artery without angina pectoris: Secondary | ICD-10-CM | POA: Insufficient documentation

## 2018-05-18 DIAGNOSIS — I10 Essential (primary) hypertension: Secondary | ICD-10-CM | POA: Insufficient documentation

## 2018-05-18 DIAGNOSIS — Z8042 Family history of malignant neoplasm of prostate: Secondary | ICD-10-CM | POA: Diagnosis not present

## 2018-05-18 DIAGNOSIS — Z791 Long term (current) use of non-steroidal anti-inflammatories (NSAID): Secondary | ICD-10-CM | POA: Insufficient documentation

## 2018-05-18 DIAGNOSIS — C61 Malignant neoplasm of prostate: Secondary | ICD-10-CM | POA: Insufficient documentation

## 2018-05-18 DIAGNOSIS — Z87891 Personal history of nicotine dependence: Secondary | ICD-10-CM | POA: Diagnosis not present

## 2018-05-18 DIAGNOSIS — Z7982 Long term (current) use of aspirin: Secondary | ICD-10-CM | POA: Diagnosis not present

## 2018-05-18 DIAGNOSIS — Z79899 Other long term (current) drug therapy: Secondary | ICD-10-CM | POA: Insufficient documentation

## 2018-05-19 ENCOUNTER — Other Ambulatory Visit: Payer: Self-pay

## 2018-05-19 DIAGNOSIS — Z791 Long term (current) use of non-steroidal anti-inflammatories (NSAID): Secondary | ICD-10-CM | POA: Insufficient documentation

## 2018-05-19 DIAGNOSIS — Z510 Encounter for antineoplastic radiation therapy: Secondary | ICD-10-CM | POA: Diagnosis present

## 2018-05-19 DIAGNOSIS — N529 Male erectile dysfunction, unspecified: Secondary | ICD-10-CM | POA: Diagnosis not present

## 2018-05-19 DIAGNOSIS — C61 Malignant neoplasm of prostate: Secondary | ICD-10-CM | POA: Insufficient documentation

## 2018-05-19 DIAGNOSIS — Z8551 Personal history of malignant neoplasm of bladder: Secondary | ICD-10-CM | POA: Diagnosis not present

## 2018-05-19 DIAGNOSIS — I2510 Atherosclerotic heart disease of native coronary artery without angina pectoris: Secondary | ICD-10-CM | POA: Diagnosis not present

## 2018-05-19 DIAGNOSIS — Z79899 Other long term (current) drug therapy: Secondary | ICD-10-CM | POA: Insufficient documentation

## 2018-05-19 DIAGNOSIS — Z8042 Family history of malignant neoplasm of prostate: Secondary | ICD-10-CM | POA: Insufficient documentation

## 2018-05-19 DIAGNOSIS — Z7982 Long term (current) use of aspirin: Secondary | ICD-10-CM | POA: Diagnosis not present

## 2018-05-19 DIAGNOSIS — Z87891 Personal history of nicotine dependence: Secondary | ICD-10-CM | POA: Insufficient documentation

## 2018-05-19 DIAGNOSIS — I10 Essential (primary) hypertension: Secondary | ICD-10-CM | POA: Diagnosis not present

## 2018-05-20 ENCOUNTER — Other Ambulatory Visit: Payer: Self-pay

## 2018-05-20 ENCOUNTER — Telehealth: Payer: Self-pay | Admitting: Urology

## 2018-05-20 ENCOUNTER — Other Ambulatory Visit: Payer: Self-pay | Admitting: *Deleted

## 2018-05-20 ENCOUNTER — Telehealth: Payer: Self-pay | Admitting: *Deleted

## 2018-05-20 ENCOUNTER — Inpatient Hospital Stay: Payer: Medicare Other | Attending: Radiation Oncology

## 2018-05-20 DIAGNOSIS — C61 Malignant neoplasm of prostate: Secondary | ICD-10-CM | POA: Diagnosis not present

## 2018-05-20 DIAGNOSIS — Z5111 Encounter for antineoplastic chemotherapy: Secondary | ICD-10-CM | POA: Diagnosis present

## 2018-05-20 MED ORDER — LEUPROLIDE ACETATE (6 MONTH) 45 MG IM KIT
45.0000 mg | PACK | Freq: Once | INTRAMUSCULAR | Status: AC
  Administered 2018-05-20: 12:00:00 45 mg via INTRAMUSCULAR
  Filled 2018-05-20: qty 45

## 2018-05-20 NOTE — Telephone Encounter (Addendum)
Pt. Having significant amount of blood in urine and wants to talk to someone about this issue today if possible and wants advice about this issue. Please call wife Patricia's cell phone [phone]  if you call today 05/20/18.

## 2018-05-20 NOTE — Telephone Encounter (Signed)
Patient reporting blood in his urine today. Denies any burning with urination. No foul smelling of urine. Has not started radiation. Had 1 dose of lupron. He wanted to know if the lupron was the cause. I explained to patient that the lupron would not cause the hematuria. He will contact urology for their input.

## 2018-05-20 NOTE — Telephone Encounter (Signed)
Spoke with patient and states that this could be from his procedure a few weeks ago and he is having no urinary symptoms or problems urinating. Patient was encouraged to increase water intake and call if blood worsens, more urinary symptoms present or if he passes clots that cause him not to be able to void

## 2018-05-24 ENCOUNTER — Telehealth: Payer: Self-pay | Admitting: Urology

## 2018-05-24 NOTE — Telephone Encounter (Signed)
Per Maudie Mercury at the cancer center they will start the patient on his lupron injections. No PA is required   [place]

## 2018-05-26 ENCOUNTER — Other Ambulatory Visit: Payer: Self-pay

## 2018-05-27 ENCOUNTER — Other Ambulatory Visit: Payer: Self-pay

## 2018-05-27 ENCOUNTER — Ambulatory Visit
Admission: RE | Admit: 2018-05-27 | Discharge: 2018-05-27 | Disposition: A | Discharge status: Home / Self Care | Payer: Medicare Other | Source: Ambulatory Visit | Attending: Radiation Oncology | Admitting: Radiation Oncology

## 2018-05-31 ENCOUNTER — Other Ambulatory Visit: Payer: Self-pay

## 2018-05-31 ENCOUNTER — Ambulatory Visit
Admission: RE | Admit: 2018-05-31 | Discharge: 2018-05-31 | Disposition: A | Discharge status: Home / Self Care | Payer: Medicare Other | Source: Ambulatory Visit | Attending: Radiation Oncology | Admitting: Radiation Oncology

## 2018-05-31 DIAGNOSIS — Z510 Encounter for antineoplastic radiation therapy: Secondary | ICD-10-CM | POA: Diagnosis not present

## 2018-06-01 ENCOUNTER — Other Ambulatory Visit: Payer: Self-pay

## 2018-06-01 ENCOUNTER — Ambulatory Visit
Admission: RE | Admit: 2018-06-01 | Discharge: 2018-06-01 | Disposition: A | Discharge status: Home / Self Care | Payer: Medicare Other | Source: Ambulatory Visit | Attending: Radiation Oncology | Admitting: Radiation Oncology

## 2018-06-01 DIAGNOSIS — Z510 Encounter for antineoplastic radiation therapy: Secondary | ICD-10-CM | POA: Diagnosis not present

## 2018-06-02 ENCOUNTER — Ambulatory Visit
Admission: RE | Admit: 2018-06-02 | Discharge: 2018-06-02 | Disposition: A | Discharge status: Home / Self Care | Payer: Medicare Other | Source: Ambulatory Visit | Attending: Radiation Oncology | Admitting: Radiation Oncology

## 2018-06-02 ENCOUNTER — Other Ambulatory Visit: Payer: Self-pay

## 2018-06-02 DIAGNOSIS — Z510 Encounter for antineoplastic radiation therapy: Secondary | ICD-10-CM | POA: Diagnosis not present

## 2018-06-03 ENCOUNTER — Other Ambulatory Visit: Payer: Self-pay

## 2018-06-03 ENCOUNTER — Ambulatory Visit
Admission: RE | Admit: 2018-06-03 | Discharge: 2018-06-03 | Disposition: A | Discharge status: Home / Self Care | Payer: Medicare Other | Source: Ambulatory Visit | Attending: Radiation Oncology | Admitting: Radiation Oncology

## 2018-06-03 ENCOUNTER — Other Ambulatory Visit: Payer: Medicare Other | Admitting: Urology

## 2018-06-03 DIAGNOSIS — Z510 Encounter for antineoplastic radiation therapy: Secondary | ICD-10-CM | POA: Diagnosis not present

## 2018-06-04 ENCOUNTER — Ambulatory Visit
Admission: RE | Admit: 2018-06-04 | Discharge: 2018-06-04 | Disposition: A | Discharge status: Home / Self Care | Payer: Medicare Other | Source: Ambulatory Visit | Attending: Radiation Oncology | Admitting: Radiation Oncology

## 2018-06-04 ENCOUNTER — Other Ambulatory Visit: Payer: Medicare Other | Admitting: Urology

## 2018-06-04 ENCOUNTER — Other Ambulatory Visit: Payer: Self-pay

## 2018-06-04 DIAGNOSIS — Z510 Encounter for antineoplastic radiation therapy: Secondary | ICD-10-CM | POA: Diagnosis not present

## 2018-06-07 ENCOUNTER — Other Ambulatory Visit: Payer: Self-pay

## 2018-06-07 ENCOUNTER — Ambulatory Visit
Admission: RE | Admit: 2018-06-07 | Discharge: 2018-06-07 | Disposition: A | Discharge status: Home / Self Care | Payer: Medicare Other | Source: Ambulatory Visit | Attending: Radiation Oncology | Admitting: Radiation Oncology

## 2018-06-07 DIAGNOSIS — Z510 Encounter for antineoplastic radiation therapy: Secondary | ICD-10-CM | POA: Diagnosis not present

## 2018-06-08 ENCOUNTER — Other Ambulatory Visit: Payer: Self-pay | Admitting: Radiation Oncology

## 2018-06-08 ENCOUNTER — Ambulatory Visit
Admission: RE | Admit: 2018-06-08 | Discharge: 2018-06-08 | Disposition: A | Discharge status: Home / Self Care | Payer: Medicare Other | Source: Ambulatory Visit | Attending: Radiation Oncology | Admitting: Radiation Oncology

## 2018-06-08 ENCOUNTER — Other Ambulatory Visit: Payer: Self-pay

## 2018-06-08 DIAGNOSIS — Z510 Encounter for antineoplastic radiation therapy: Secondary | ICD-10-CM | POA: Diagnosis not present

## 2018-06-09 ENCOUNTER — Other Ambulatory Visit: Payer: Self-pay | Admitting: *Deleted

## 2018-06-09 ENCOUNTER — Other Ambulatory Visit: Payer: Self-pay

## 2018-06-09 ENCOUNTER — Ambulatory Visit
Admission: RE | Admit: 2018-06-09 | Discharge: 2018-06-09 | Disposition: A | Discharge status: Home / Self Care | Payer: Medicare Other | Source: Ambulatory Visit | Attending: Radiation Oncology | Admitting: Radiation Oncology

## 2018-06-09 DIAGNOSIS — Z510 Encounter for antineoplastic radiation therapy: Secondary | ICD-10-CM | POA: Diagnosis not present

## 2018-06-10 ENCOUNTER — Ambulatory Visit
Admission: RE | Admit: 2018-06-10 | Discharge: 2018-06-10 | Disposition: A | Discharge status: Home / Self Care | Payer: Medicare Other | Source: Ambulatory Visit | Attending: Radiation Oncology | Admitting: Radiation Oncology

## 2018-06-10 ENCOUNTER — Other Ambulatory Visit: Payer: Self-pay

## 2018-06-10 DIAGNOSIS — Z510 Encounter for antineoplastic radiation therapy: Secondary | ICD-10-CM | POA: Diagnosis not present

## 2018-06-11 ENCOUNTER — Ambulatory Visit
Admission: RE | Admit: 2018-06-11 | Discharge: 2018-06-11 | Disposition: A | Discharge status: Home / Self Care | Payer: Medicare Other | Source: Ambulatory Visit | Attending: Radiation Oncology | Admitting: Radiation Oncology

## 2018-06-11 ENCOUNTER — Other Ambulatory Visit: Payer: Self-pay

## 2018-06-11 DIAGNOSIS — Z510 Encounter for antineoplastic radiation therapy: Secondary | ICD-10-CM | POA: Diagnosis not present

## 2018-06-14 ENCOUNTER — Other Ambulatory Visit: Payer: Self-pay

## 2018-06-14 ENCOUNTER — Ambulatory Visit
Admission: RE | Admit: 2018-06-14 | Discharge: 2018-06-14 | Disposition: A | Discharge status: Home / Self Care | Payer: Medicare Other | Source: Ambulatory Visit | Attending: Radiation Oncology | Admitting: Radiation Oncology

## 2018-06-14 DIAGNOSIS — Z510 Encounter for antineoplastic radiation therapy: Secondary | ICD-10-CM | POA: Diagnosis not present

## 2018-06-15 ENCOUNTER — Ambulatory Visit
Admission: RE | Admit: 2018-06-15 | Discharge: 2018-06-15 | Disposition: A | Discharge status: Home / Self Care | Payer: Medicare Other | Source: Ambulatory Visit | Attending: Radiation Oncology | Admitting: Radiation Oncology

## 2018-06-15 ENCOUNTER — Other Ambulatory Visit: Payer: Self-pay

## 2018-06-15 DIAGNOSIS — Z510 Encounter for antineoplastic radiation therapy: Secondary | ICD-10-CM | POA: Diagnosis not present

## 2018-06-16 ENCOUNTER — Ambulatory Visit
Admission: RE | Admit: 2018-06-16 | Discharge: 2018-06-16 | Disposition: A | Discharge status: Home / Self Care | Payer: Medicare Other | Source: Ambulatory Visit | Attending: Radiation Oncology | Admitting: Radiation Oncology

## 2018-06-16 ENCOUNTER — Inpatient Hospital Stay: Payer: Medicare Other

## 2018-06-16 ENCOUNTER — Other Ambulatory Visit: Payer: Self-pay

## 2018-06-16 DIAGNOSIS — Z5111 Encounter for antineoplastic chemotherapy: Secondary | ICD-10-CM | POA: Diagnosis not present

## 2018-06-16 DIAGNOSIS — C61 Malignant neoplasm of prostate: Secondary | ICD-10-CM

## 2018-06-16 DIAGNOSIS — Z510 Encounter for antineoplastic radiation therapy: Secondary | ICD-10-CM | POA: Diagnosis not present

## 2018-06-16 LAB — CBC
HCT: 43.6 % (ref 39.0–52.0)
Hemoglobin: 15.2 g/dL (ref 13.0–17.0)
MCH: 34.7 pg — ABNORMAL HIGH (ref 26.0–34.0)
MCHC: 34.9 g/dL (ref 30.0–36.0)
MCV: 99.5 fL (ref 80.0–100.0)
Platelets: 181 10*3/uL (ref 150–400)
RBC: 4.38 MIL/uL (ref 4.22–5.81)
RDW: 11.8 % (ref 11.5–15.5)
WBC: 9.3 10*3/uL (ref 4.0–10.5)
nRBC: 0 % (ref 0.0–0.2)

## 2018-06-17 ENCOUNTER — Other Ambulatory Visit: Payer: Self-pay

## 2018-06-17 ENCOUNTER — Ambulatory Visit
Admission: RE | Admit: 2018-06-17 | Discharge: 2018-06-17 | Disposition: A | Discharge status: Home / Self Care | Payer: Medicare Other | Source: Ambulatory Visit | Attending: Radiation Oncology | Admitting: Radiation Oncology

## 2018-06-17 DIAGNOSIS — Z510 Encounter for antineoplastic radiation therapy: Secondary | ICD-10-CM | POA: Diagnosis not present

## 2018-06-18 ENCOUNTER — Other Ambulatory Visit: Payer: Self-pay

## 2018-06-18 ENCOUNTER — Ambulatory Visit
Admission: RE | Admit: 2018-06-18 | Discharge: 2018-06-18 | Disposition: A | Discharge status: Home / Self Care | Payer: Medicare Other | Source: Ambulatory Visit | Attending: Radiation Oncology | Admitting: Radiation Oncology

## 2018-06-18 DIAGNOSIS — Z791 Long term (current) use of non-steroidal anti-inflammatories (NSAID): Secondary | ICD-10-CM | POA: Insufficient documentation

## 2018-06-18 DIAGNOSIS — C61 Malignant neoplasm of prostate: Secondary | ICD-10-CM | POA: Insufficient documentation

## 2018-06-18 DIAGNOSIS — Z8551 Personal history of malignant neoplasm of bladder: Secondary | ICD-10-CM | POA: Diagnosis not present

## 2018-06-18 DIAGNOSIS — Z8042 Family history of malignant neoplasm of prostate: Secondary | ICD-10-CM | POA: Insufficient documentation

## 2018-06-18 DIAGNOSIS — I2510 Atherosclerotic heart disease of native coronary artery without angina pectoris: Secondary | ICD-10-CM | POA: Insufficient documentation

## 2018-06-18 DIAGNOSIS — Z87891 Personal history of nicotine dependence: Secondary | ICD-10-CM | POA: Insufficient documentation

## 2018-06-18 DIAGNOSIS — Z7982 Long term (current) use of aspirin: Secondary | ICD-10-CM | POA: Diagnosis not present

## 2018-06-18 DIAGNOSIS — I10 Essential (primary) hypertension: Secondary | ICD-10-CM | POA: Insufficient documentation

## 2018-06-18 DIAGNOSIS — Z510 Encounter for antineoplastic radiation therapy: Secondary | ICD-10-CM | POA: Insufficient documentation

## 2018-06-18 DIAGNOSIS — N529 Male erectile dysfunction, unspecified: Secondary | ICD-10-CM | POA: Diagnosis not present

## 2018-06-18 DIAGNOSIS — Z79899 Other long term (current) drug therapy: Secondary | ICD-10-CM | POA: Insufficient documentation

## 2018-06-21 ENCOUNTER — Other Ambulatory Visit: Payer: Self-pay

## 2018-06-21 ENCOUNTER — Ambulatory Visit
Admission: RE | Admit: 2018-06-21 | Discharge: 2018-06-21 | Disposition: A | Discharge status: Home / Self Care | Payer: Medicare Other | Source: Ambulatory Visit | Attending: Radiation Oncology | Admitting: Radiation Oncology

## 2018-06-21 DIAGNOSIS — Z510 Encounter for antineoplastic radiation therapy: Secondary | ICD-10-CM | POA: Diagnosis not present

## 2018-06-22 ENCOUNTER — Other Ambulatory Visit: Payer: Self-pay

## 2018-06-22 ENCOUNTER — Ambulatory Visit
Admission: RE | Admit: 2018-06-22 | Discharge: 2018-06-22 | Disposition: A | Discharge status: Home / Self Care | Payer: Medicare Other | Source: Ambulatory Visit | Attending: Radiation Oncology | Admitting: Radiation Oncology

## 2018-06-22 DIAGNOSIS — Z510 Encounter for antineoplastic radiation therapy: Secondary | ICD-10-CM | POA: Diagnosis not present

## 2018-06-23 ENCOUNTER — Other Ambulatory Visit: Payer: Self-pay

## 2018-06-23 ENCOUNTER — Ambulatory Visit
Admission: RE | Admit: 2018-06-23 | Discharge: 2018-06-23 | Disposition: A | Discharge status: Home / Self Care | Payer: Medicare Other | Source: Ambulatory Visit | Attending: Radiation Oncology | Admitting: Radiation Oncology

## 2018-06-23 DIAGNOSIS — Z510 Encounter for antineoplastic radiation therapy: Secondary | ICD-10-CM | POA: Diagnosis not present

## 2018-06-24 ENCOUNTER — Other Ambulatory Visit: Payer: Self-pay

## 2018-06-24 ENCOUNTER — Ambulatory Visit
Admission: RE | Admit: 2018-06-24 | Discharge: 2018-06-24 | Disposition: A | Discharge status: Home / Self Care | Payer: Medicare Other | Source: Ambulatory Visit | Attending: Radiation Oncology | Admitting: Radiation Oncology

## 2018-06-24 DIAGNOSIS — Z510 Encounter for antineoplastic radiation therapy: Secondary | ICD-10-CM | POA: Diagnosis not present

## 2018-06-25 ENCOUNTER — Other Ambulatory Visit: Payer: Self-pay

## 2018-06-25 ENCOUNTER — Ambulatory Visit
Admission: RE | Admit: 2018-06-25 | Discharge: 2018-06-25 | Disposition: A | Discharge status: Home / Self Care | Payer: Medicare Other | Source: Ambulatory Visit | Attending: Radiation Oncology | Admitting: Radiation Oncology

## 2018-06-25 DIAGNOSIS — Z510 Encounter for antineoplastic radiation therapy: Secondary | ICD-10-CM | POA: Diagnosis not present

## 2018-06-28 ENCOUNTER — Ambulatory Visit
Admission: RE | Admit: 2018-06-28 | Discharge: 2018-06-28 | Disposition: A | Discharge status: Home / Self Care | Payer: Medicare Other | Source: Ambulatory Visit | Attending: Radiation Oncology | Admitting: Radiation Oncology

## 2018-06-28 ENCOUNTER — Other Ambulatory Visit: Payer: Self-pay

## 2018-06-28 DIAGNOSIS — Z510 Encounter for antineoplastic radiation therapy: Secondary | ICD-10-CM | POA: Diagnosis not present

## 2018-06-29 ENCOUNTER — Other Ambulatory Visit: Payer: Self-pay

## 2018-06-29 ENCOUNTER — Ambulatory Visit
Admission: RE | Admit: 2018-06-29 | Discharge: 2018-06-29 | Disposition: A | Discharge status: Home / Self Care | Payer: Medicare Other | Source: Ambulatory Visit | Attending: Radiation Oncology | Admitting: Radiation Oncology

## 2018-06-29 DIAGNOSIS — Z510 Encounter for antineoplastic radiation therapy: Secondary | ICD-10-CM | POA: Diagnosis not present

## 2018-06-30 ENCOUNTER — Inpatient Hospital Stay: Payer: Medicare Other

## 2018-06-30 ENCOUNTER — Other Ambulatory Visit: Payer: Self-pay

## 2018-06-30 ENCOUNTER — Ambulatory Visit
Admission: RE | Admit: 2018-06-30 | Discharge: 2018-06-30 | Disposition: A | Discharge status: Home / Self Care | Payer: Medicare Other | Source: Ambulatory Visit | Attending: Radiation Oncology | Admitting: Radiation Oncology

## 2018-06-30 DIAGNOSIS — C61 Malignant neoplasm of prostate: Secondary | ICD-10-CM | POA: Insufficient documentation

## 2018-06-30 DIAGNOSIS — Z510 Encounter for antineoplastic radiation therapy: Secondary | ICD-10-CM | POA: Diagnosis not present

## 2018-06-30 LAB — CBC
HCT: 44.6 % (ref 39.0–52.0)
Hemoglobin: 15.3 g/dL (ref 13.0–17.0)
MCH: 34.7 pg — ABNORMAL HIGH (ref 26.0–34.0)
MCHC: 34.3 g/dL (ref 30.0–36.0)
MCV: 101.1 fL — ABNORMAL HIGH (ref 80.0–100.0)
Platelets: 154 10*3/uL (ref 150–400)
RBC: 4.41 MIL/uL (ref 4.22–5.81)
RDW: 12.3 % (ref 11.5–15.5)
WBC: 7.8 10*3/uL (ref 4.0–10.5)
nRBC: 0 % (ref 0.0–0.2)

## 2018-07-01 ENCOUNTER — Ambulatory Visit
Admission: RE | Admit: 2018-07-01 | Discharge: 2018-07-01 | Disposition: A | Discharge status: Home / Self Care | Payer: Medicare Other | Source: Ambulatory Visit | Attending: Radiation Oncology | Admitting: Radiation Oncology

## 2018-07-01 ENCOUNTER — Other Ambulatory Visit: Payer: Self-pay

## 2018-07-01 DIAGNOSIS — Z510 Encounter for antineoplastic radiation therapy: Secondary | ICD-10-CM | POA: Diagnosis not present

## 2018-07-02 ENCOUNTER — Other Ambulatory Visit: Payer: Self-pay

## 2018-07-02 ENCOUNTER — Ambulatory Visit
Admission: RE | Admit: 2018-07-02 | Discharge: 2018-07-02 | Disposition: A | Discharge status: Home / Self Care | Payer: Medicare Other | Source: Ambulatory Visit | Attending: Radiation Oncology | Admitting: Radiation Oncology

## 2018-07-02 DIAGNOSIS — Z510 Encounter for antineoplastic radiation therapy: Secondary | ICD-10-CM | POA: Diagnosis not present

## 2018-07-05 ENCOUNTER — Ambulatory Visit
Admission: RE | Admit: 2018-07-05 | Discharge: 2018-07-05 | Disposition: A | Discharge status: Home / Self Care | Payer: Medicare Other | Source: Ambulatory Visit | Attending: Radiation Oncology | Admitting: Radiation Oncology

## 2018-07-05 ENCOUNTER — Other Ambulatory Visit: Payer: Self-pay

## 2018-07-05 DIAGNOSIS — Z510 Encounter for antineoplastic radiation therapy: Secondary | ICD-10-CM | POA: Diagnosis not present

## 2018-07-06 ENCOUNTER — Other Ambulatory Visit: Payer: Self-pay

## 2018-07-06 ENCOUNTER — Ambulatory Visit
Admission: RE | Admit: 2018-07-06 | Discharge: 2018-07-06 | Disposition: A | Discharge status: Home / Self Care | Payer: Medicare Other | Source: Ambulatory Visit | Attending: Radiation Oncology | Admitting: Radiation Oncology

## 2018-07-06 DIAGNOSIS — Z510 Encounter for antineoplastic radiation therapy: Secondary | ICD-10-CM | POA: Diagnosis not present

## 2018-07-07 ENCOUNTER — Other Ambulatory Visit: Payer: Self-pay

## 2018-07-07 ENCOUNTER — Ambulatory Visit
Admission: RE | Admit: 2018-07-07 | Discharge: 2018-07-07 | Disposition: A | Discharge status: Home / Self Care | Payer: Medicare Other | Source: Ambulatory Visit | Attending: Radiation Oncology | Admitting: Radiation Oncology

## 2018-07-07 DIAGNOSIS — Z510 Encounter for antineoplastic radiation therapy: Secondary | ICD-10-CM | POA: Diagnosis not present

## 2018-07-08 ENCOUNTER — Other Ambulatory Visit: Payer: Self-pay

## 2018-07-08 ENCOUNTER — Ambulatory Visit
Admission: RE | Admit: 2018-07-08 | Discharge: 2018-07-08 | Disposition: A | Discharge status: Home / Self Care | Payer: Medicare Other | Source: Ambulatory Visit | Attending: Radiation Oncology | Admitting: Radiation Oncology

## 2018-07-08 DIAGNOSIS — Z510 Encounter for antineoplastic radiation therapy: Secondary | ICD-10-CM | POA: Diagnosis not present

## 2018-07-09 ENCOUNTER — Ambulatory Visit
Admission: RE | Admit: 2018-07-09 | Discharge: 2018-07-09 | Disposition: A | Discharge status: Home / Self Care | Payer: Medicare Other | Source: Ambulatory Visit | Attending: Radiation Oncology | Admitting: Radiation Oncology

## 2018-07-09 ENCOUNTER — Other Ambulatory Visit: Payer: Self-pay

## 2018-07-09 DIAGNOSIS — Z510 Encounter for antineoplastic radiation therapy: Secondary | ICD-10-CM | POA: Diagnosis not present

## 2018-07-13 ENCOUNTER — Other Ambulatory Visit: Payer: Self-pay

## 2018-07-13 ENCOUNTER — Ambulatory Visit
Admission: RE | Admit: 2018-07-13 | Discharge: 2018-07-13 | Disposition: A | Discharge status: Home / Self Care | Payer: Medicare Other | Source: Ambulatory Visit | Attending: Radiation Oncology | Admitting: Radiation Oncology

## 2018-07-13 DIAGNOSIS — Z510 Encounter for antineoplastic radiation therapy: Secondary | ICD-10-CM | POA: Diagnosis not present

## 2018-07-14 ENCOUNTER — Other Ambulatory Visit: Payer: Self-pay

## 2018-07-14 ENCOUNTER — Inpatient Hospital Stay: Payer: Medicare Other

## 2018-07-14 ENCOUNTER — Ambulatory Visit
Admission: RE | Admit: 2018-07-14 | Discharge: 2018-07-14 | Disposition: A | Discharge status: Home / Self Care | Payer: Medicare Other | Source: Ambulatory Visit | Attending: Radiation Oncology | Admitting: Radiation Oncology

## 2018-07-14 DIAGNOSIS — Z510 Encounter for antineoplastic radiation therapy: Secondary | ICD-10-CM | POA: Diagnosis not present

## 2018-07-14 DIAGNOSIS — C61 Malignant neoplasm of prostate: Secondary | ICD-10-CM

## 2018-07-14 LAB — CBC
HCT: 41.5 % (ref 39.0–52.0)
Hemoglobin: 14.4 g/dL (ref 13.0–17.0)
MCH: 35 pg — ABNORMAL HIGH (ref 26.0–34.0)
MCHC: 34.7 g/dL (ref 30.0–36.0)
MCV: 100.7 fL — ABNORMAL HIGH (ref 80.0–100.0)
Platelets: 173 10*3/uL (ref 150–400)
RBC: 4.12 MIL/uL — ABNORMAL LOW (ref 4.22–5.81)
RDW: 12.6 % (ref 11.5–15.5)
WBC: 6.6 10*3/uL (ref 4.0–10.5)
nRBC: 0 % (ref 0.0–0.2)

## 2018-07-15 ENCOUNTER — Ambulatory Visit
Admission: RE | Admit: 2018-07-15 | Discharge: 2018-07-15 | Disposition: A | Discharge status: Home / Self Care | Payer: Medicare Other | Source: Ambulatory Visit | Attending: Radiation Oncology | Admitting: Radiation Oncology

## 2018-07-15 ENCOUNTER — Other Ambulatory Visit: Payer: Self-pay

## 2018-07-15 DIAGNOSIS — Z510 Encounter for antineoplastic radiation therapy: Secondary | ICD-10-CM | POA: Diagnosis not present

## 2018-07-16 ENCOUNTER — Other Ambulatory Visit: Payer: Self-pay

## 2018-07-16 ENCOUNTER — Ambulatory Visit
Admission: RE | Admit: 2018-07-16 | Discharge: 2018-07-16 | Disposition: A | Discharge status: Home / Self Care | Payer: Medicare Other | Source: Ambulatory Visit | Attending: Radiation Oncology | Admitting: Radiation Oncology

## 2018-07-16 DIAGNOSIS — Z510 Encounter for antineoplastic radiation therapy: Secondary | ICD-10-CM | POA: Diagnosis not present

## 2018-07-19 ENCOUNTER — Other Ambulatory Visit: Payer: Self-pay

## 2018-07-19 ENCOUNTER — Ambulatory Visit
Admission: RE | Admit: 2018-07-19 | Discharge: 2018-07-19 | Disposition: A | Discharge status: Home / Self Care | Payer: Medicare Other | Source: Ambulatory Visit | Attending: Radiation Oncology | Admitting: Radiation Oncology

## 2018-07-19 DIAGNOSIS — Z8042 Family history of malignant neoplasm of prostate: Secondary | ICD-10-CM | POA: Diagnosis not present

## 2018-07-19 DIAGNOSIS — C61 Malignant neoplasm of prostate: Secondary | ICD-10-CM | POA: Insufficient documentation

## 2018-07-19 DIAGNOSIS — Z8551 Personal history of malignant neoplasm of bladder: Secondary | ICD-10-CM | POA: Insufficient documentation

## 2018-07-19 DIAGNOSIS — Z87891 Personal history of nicotine dependence: Secondary | ICD-10-CM | POA: Diagnosis not present

## 2018-07-19 DIAGNOSIS — Z7982 Long term (current) use of aspirin: Secondary | ICD-10-CM | POA: Diagnosis not present

## 2018-07-19 DIAGNOSIS — Z79899 Other long term (current) drug therapy: Secondary | ICD-10-CM | POA: Diagnosis not present

## 2018-07-19 DIAGNOSIS — N529 Male erectile dysfunction, unspecified: Secondary | ICD-10-CM | POA: Insufficient documentation

## 2018-07-19 DIAGNOSIS — I2510 Atherosclerotic heart disease of native coronary artery without angina pectoris: Secondary | ICD-10-CM | POA: Insufficient documentation

## 2018-07-19 DIAGNOSIS — Z791 Long term (current) use of non-steroidal anti-inflammatories (NSAID): Secondary | ICD-10-CM | POA: Diagnosis not present

## 2018-07-19 DIAGNOSIS — Z510 Encounter for antineoplastic radiation therapy: Secondary | ICD-10-CM | POA: Diagnosis present

## 2018-07-19 DIAGNOSIS — I10 Essential (primary) hypertension: Secondary | ICD-10-CM | POA: Diagnosis not present

## 2018-07-20 ENCOUNTER — Encounter: Payer: Self-pay | Admitting: Urology

## 2018-07-20 ENCOUNTER — Ambulatory Visit
Admission: RE | Admit: 2018-07-20 | Discharge: 2018-07-20 | Disposition: A | Discharge status: Home / Self Care | Payer: Medicare Other | Source: Ambulatory Visit | Attending: Radiation Oncology | Admitting: Radiation Oncology

## 2018-07-20 ENCOUNTER — Other Ambulatory Visit: Payer: Self-pay

## 2018-07-20 DIAGNOSIS — Z510 Encounter for antineoplastic radiation therapy: Secondary | ICD-10-CM | POA: Diagnosis not present

## 2018-07-21 ENCOUNTER — Other Ambulatory Visit: Payer: Self-pay

## 2018-07-21 ENCOUNTER — Ambulatory Visit
Admission: RE | Admit: 2018-07-21 | Discharge: 2018-07-21 | Disposition: A | Discharge status: Home / Self Care | Payer: Medicare Other | Source: Ambulatory Visit | Attending: Radiation Oncology | Admitting: Radiation Oncology

## 2018-07-21 DIAGNOSIS — Z510 Encounter for antineoplastic radiation therapy: Secondary | ICD-10-CM | POA: Diagnosis not present

## 2018-07-22 ENCOUNTER — Other Ambulatory Visit: Payer: Self-pay

## 2018-07-22 ENCOUNTER — Ambulatory Visit
Admission: RE | Admit: 2018-07-22 | Discharge: 2018-07-22 | Disposition: A | Discharge status: Home / Self Care | Payer: Medicare Other | Source: Ambulatory Visit | Attending: Radiation Oncology | Admitting: Radiation Oncology

## 2018-07-22 DIAGNOSIS — Z510 Encounter for antineoplastic radiation therapy: Secondary | ICD-10-CM | POA: Diagnosis not present

## 2018-07-23 ENCOUNTER — Ambulatory Visit
Admission: RE | Admit: 2018-07-23 | Discharge: 2018-07-23 | Disposition: A | Discharge status: Home / Self Care | Payer: Medicare Other | Source: Ambulatory Visit | Attending: Radiation Oncology | Admitting: Radiation Oncology

## 2018-07-23 ENCOUNTER — Other Ambulatory Visit: Payer: Self-pay

## 2018-07-23 DIAGNOSIS — Z510 Encounter for antineoplastic radiation therapy: Secondary | ICD-10-CM | POA: Diagnosis not present

## 2018-07-26 ENCOUNTER — Other Ambulatory Visit: Payer: Self-pay

## 2018-07-26 ENCOUNTER — Ambulatory Visit
Admission: RE | Admit: 2018-07-26 | Discharge: 2018-07-26 | Disposition: A | Discharge status: Home / Self Care | Payer: Medicare Other | Source: Ambulatory Visit | Attending: Radiation Oncology | Admitting: Radiation Oncology

## 2018-07-26 DIAGNOSIS — Z510 Encounter for antineoplastic radiation therapy: Secondary | ICD-10-CM | POA: Diagnosis not present

## 2018-08-13 ENCOUNTER — Encounter: Payer: Self-pay | Admitting: Urology

## 2018-08-27 ENCOUNTER — Other Ambulatory Visit: Payer: Self-pay

## 2018-08-30 ENCOUNTER — Encounter: Payer: Self-pay | Admitting: Radiation Oncology

## 2018-08-30 ENCOUNTER — Other Ambulatory Visit: Payer: Self-pay | Admitting: *Deleted

## 2018-08-30 ENCOUNTER — Other Ambulatory Visit: Payer: Self-pay

## 2018-08-30 ENCOUNTER — Ambulatory Visit
Admission: RE | Admit: 2018-08-30 | Discharge: 2018-08-30 | Disposition: A | Discharge status: Home / Self Care | Payer: Medicare Other | Source: Ambulatory Visit | Attending: Radiation Oncology | Admitting: Radiation Oncology

## 2018-08-30 VITALS — BP 118/67 | HR 68 | Temp 97.5°F | Resp 16 | Wt 196.4 lb

## 2018-08-30 DIAGNOSIS — Z923 Personal history of irradiation: Secondary | ICD-10-CM | POA: Diagnosis not present

## 2018-08-30 DIAGNOSIS — C61 Malignant neoplasm of prostate: Secondary | ICD-10-CM | POA: Diagnosis present

## 2018-08-30 NOTE — Progress Notes (Signed)
Radiation Oncology Follow up Note  Name: [name]   Date:   08/30/2018 MRN:  [id number] DOB: 01-Oct-1945    This 73 y.o. male presents to the clinic today for 1 month follow-up status post radiation therapy to his prostate for a Gleason 7 (4+3 adenocarcinoma..  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  Seen today in routine follow-up he is doing well he states his frequency and urgency of urination have improved he is having no specific diarrhea.  He is having very little fatigue..  COMPLICATIONS OF TREATMENT: none  FOLLOW UP COMPLIANCE: keeps appointments   PHYSICAL EXAM:  BP 118/67 (BP Location: Left Arm, Patient Position: Sitting)   Pulse 68   Temp (!) 97.5 F (36.4 C) (Tympanic)   Resp 16   Wt 196 lb 6.9 oz (89.1 kg)   BMI 31.70 kg/m  Well-developed well-nourished patient in NAD. HEENT reveals PERLA, EOMI, discs not visualized.  Oral cavity is clear. No oral mucosal lesions are identified. Neck is clear without evidence of cervical or supraclavicular adenopathy. Lungs are clear to A&P. Cardiac examination is essentially unremarkable with regular rate and rhythm without murmur rub or thrill. Abdomen is benign with no organomegaly or masses noted. Motor sensory and DTR levels are equal and symmetric in the upper and lower extremities. Cranial nerves II through XII are grossly intact. Proprioception is intact. No peripheral adenopathy or edema is identified. No motor or sensory levels are noted. Crude visual fields are within normal range.  RADIOLOGY RESULTS: No current films for review  PLAN: Present time he is doing well excellent side effect profile  from IMRT radiation for his prostate cancer.  I have asked to see him back in 3 months with a PSA prior to that visit.  We will also arrange to him to have follow-up androgen deprivation therapy through Dr. Cherrie Gauze office.  He is due in October for that.  Patient knows to call with any concerns.  I would like to take this opportunity to thank you for allowing me to participate in the care of your patient.[name], MD

## 2018-09-08 ENCOUNTER — Other Ambulatory Visit: Payer: Medicare Other | Admitting: Urology

## 2018-09-17 ENCOUNTER — Ambulatory Visit (INDEPENDENT_AMBULATORY_CARE_PROVIDER_SITE_OTHER): Payer: Medicare Other | Admitting: Urology

## 2018-09-17 ENCOUNTER — Other Ambulatory Visit: Payer: Self-pay

## 2018-09-17 ENCOUNTER — Encounter: Payer: Self-pay | Admitting: Urology

## 2018-09-17 VITALS — BP 101/57 | HR 73 | Ht 66.0 in | Wt 195.0 lb

## 2018-09-17 DIAGNOSIS — C679 Malignant neoplasm of bladder, unspecified: Secondary | ICD-10-CM

## 2018-09-17 LAB — MICROSCOPIC EXAMINATION

## 2018-09-17 LAB — URINALYSIS, COMPLETE
Bilirubin, UA: NEGATIVE
Glucose, UA: NEGATIVE
Ketones, UA: NEGATIVE
Leukocytes,UA: NEGATIVE
Nitrite, UA: NEGATIVE
Protein,UA: NEGATIVE
RBC, UA: NEGATIVE
Specific Gravity, UA: 1.02 (ref 1.005–1.030)
Urobilinogen, Ur: 0.2 mg/dL (ref 0.2–1.0)
pH, UA: 6.5 (ref 5.0–7.5)

## 2018-09-17 MED ORDER — LIDOCAINE HCL URETHRAL/MUCOSAL 2 % EX GEL
1.0000 "application " | Freq: Once | CUTANEOUS | Status: AC
  Administered 2018-09-17: 1 via URETHRAL

## 2018-09-17 NOTE — Progress Notes (Signed)
   09/17/18  CC:  Chief Complaint  Patient presents with  . 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 Completed a six-week course of intravesical gemcitabine 10/08/2015.  No recurrences since that time.  He recently completed radiation therapy for intermediate risk prostate cancer.  He is scheduled for follow-up with Dr. Baruch Gouty in 3 months.  There were no vitals taken for this visit. NED. A&Ox3.   No respiratory distress   Abd soft, NT, ND Normal phallus with bilateral descended testicles  Cystoscopy Procedure Note  Patient identification was confirmed, informed consent was obtained, and patient was prepped using Betadine solution.  Lidocaine jelly was administered per urethral meatus.     Pre-Procedure: - Inspection reveals a normal caliber urethral meatus.  Procedure: The flexible cystoscope was introduced without difficulty - No urethral strictures/lesions are present. -Moderate lateral lobe enlargementprostate  -Mild elevationbladder neck - Bilateral ureteral orifices identified - Bladder mucosa reveals no ulcers, tumors, or lesions.Minimal erythema posterior wall - No bladder stones - No trabeculation  Retroflexion showsno significant abnormalities   Post-Procedure: - Patient tolerated the procedure well  Assessment/ Plan: No evidence of recurrent urothelial carcinoma.  Follow-up surveillance cystoscopy 6 months.  PSA prior to cystoscopy.   Abbie Sons, MD

## 2018-09-18 ENCOUNTER — Encounter: Payer: Self-pay | Admitting: Urology

## 2018-09-18 ENCOUNTER — Encounter: Payer: Self-pay | Admitting: Radiation Oncology

## 2018-10-01 ENCOUNTER — Encounter: Payer: Self-pay | Admitting: Urology

## 2018-10-05 ENCOUNTER — Encounter: Payer: Self-pay | Admitting: Radiation Oncology

## 2018-10-05 DIAGNOSIS — Z83518 Family history of other specified eye disorder: Secondary | ICD-10-CM

## 2018-10-05 HISTORY — DX: Family history of other specified eye disorder: Z83.518

## 2018-11-22 ENCOUNTER — Ambulatory Visit: Payer: Self-pay

## 2018-11-25 ENCOUNTER — Inpatient Hospital Stay: Payer: Medicare Other

## 2018-11-25 ENCOUNTER — Inpatient Hospital Stay: Payer: Medicare Other | Attending: Radiation Oncology

## 2018-11-25 ENCOUNTER — Other Ambulatory Visit: Payer: Self-pay

## 2018-11-25 DIAGNOSIS — C61 Malignant neoplasm of prostate: Secondary | ICD-10-CM

## 2018-11-25 DIAGNOSIS — Z5111 Encounter for antineoplastic chemotherapy: Secondary | ICD-10-CM | POA: Insufficient documentation

## 2018-11-25 LAB — PSA: Prostatic Specific Antigen: 0.02 ng/mL (ref 0.00–4.00)

## 2018-11-25 MED ORDER — LEUPROLIDE ACETATE (6 MONTH) 45 MG ~~LOC~~ KIT
45.0000 mg | PACK | Freq: Once | SUBCUTANEOUS | Status: AC
  Administered 2018-11-25: 15:00:00 45 mg via SUBCUTANEOUS
  Filled 2018-11-25: qty 45

## 2018-12-02 ENCOUNTER — Ambulatory Visit: Payer: Medicare Other

## 2018-12-02 ENCOUNTER — Ambulatory Visit
Admission: RE | Admit: 2018-12-02 | Discharge: 2018-12-02 | Disposition: A | Discharge status: Home / Self Care | Payer: Medicare Other | Source: Ambulatory Visit | Attending: Radiation Oncology | Admitting: Radiation Oncology

## 2018-12-02 ENCOUNTER — Encounter: Payer: Self-pay | Admitting: Radiation Oncology

## 2018-12-02 ENCOUNTER — Other Ambulatory Visit: Payer: Self-pay

## 2018-12-02 VITALS — BP 108/60 | HR 70 | Temp 97.3°F | Resp 16 | Wt 202.4 lb

## 2018-12-02 DIAGNOSIS — C61 Malignant neoplasm of prostate: Secondary | ICD-10-CM | POA: Insufficient documentation

## 2018-12-02 DIAGNOSIS — Z923 Personal history of irradiation: Secondary | ICD-10-CM | POA: Insufficient documentation

## 2018-12-02 NOTE — Progress Notes (Signed)
Radiation Oncology Follow up Note  Name: [name]   Date:   12/02/2018 MRN:  [id number] DOB: [birth year]    This 73 y.o. 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  Seen today in routine follow-up he is doing well specifically denies any increased lower urinary tract symptoms diarrhea fatigue.  His most recent PSA is 0.02.  He is currently undergoing androgen deprivation therapy..  COMPLICATIONS OF TREATMENT: none  FOLLOW UP COMPLIANCE: keeps appointments   PHYSICAL EXAM:  BP 108/60 (BP Location: Left Arm, Patient Position: Sitting)   Pulse 70   Temp (!) 97.3 F (36.3 C) (Tympanic)   Resp 16   Wt 202 lb 6.4 oz (91.8 kg)   BMI 32.67 kg/m  Well-developed well-nourished patient in NAD. HEENT reveals PERLA, EOMI, discs not visualized.  Oral cavity is clear. No oral mucosal lesions are identified. Neck is clear without evidence of cervical or supraclavicular adenopathy. Lungs are clear to A&P. Cardiac examination is essentially unremarkable with regular rate and rhythm without murmur rub or thrill. Abdomen is benign with no organomegaly or masses noted. Motor sensory and DTR levels are equal and symmetric in the upper and lower extremities. Cranial nerves II through XII are grossly intact. Proprioception is intact. No peripheral adenopathy or edema is identified. No motor or sensory levels are noted. Crude visual fields are within normal range.  RADIOLOGY RESULTS: No current films to review  PLAN: Present time patient is under excellent biochemical control of his prostate cancer.  He continues on androgen deprivation therapy.  Of  asked to see him back in 6 months for follow-up with a PSA at that time.  Patient knows to call with any concerns.  He continues follow-up care with urology.  Patient knows to call with any concerns.  I would like to take this opportunity to thank you for allowing me to participate in the care of your patient.[name], MD

## 2019-01-20 ENCOUNTER — Encounter: Payer: Self-pay | Admitting: Radiation Oncology

## 2019-02-18 HISTORY — PX: CATARACT EXTRACTION W/ INTRAOCULAR LENS IMPLANT: SHX1309

## 2019-03-21 ENCOUNTER — Other Ambulatory Visit: Payer: Medicare Other | Admitting: Urology

## 2019-03-23 ENCOUNTER — Ambulatory Visit (INDEPENDENT_AMBULATORY_CARE_PROVIDER_SITE_OTHER): Payer: Medicare Other | Admitting: Urology

## 2019-03-23 ENCOUNTER — Other Ambulatory Visit: Payer: Self-pay

## 2019-03-23 ENCOUNTER — Encounter: Payer: Self-pay | Admitting: Urology

## 2019-03-23 VITALS — BP 150/81 | HR 82 | Ht 66.0 in | Wt 204.0 lb

## 2019-03-23 DIAGNOSIS — Z8551 Personal history of malignant neoplasm of bladder: Secondary | ICD-10-CM

## 2019-03-23 NOTE — Progress Notes (Signed)
   03/23/19  CC:  Chief Complaint  Patient presents with  . Cysto    Urologic history: 1.  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  Intermediate risk adenocarcinoma prostate -Treated IMRT+ ADT -PSA 11/2018 0.02   HPI: Mr. Gneiting presents for surveillance cystoscopy.  Urinalysis was unremarkable  Blood pressure (!) 150/81, pulse 82, height 5\' 6"  (1.676 m), weight 204 lb (92.5 kg). NED. A&Ox3.   No respiratory distress   Abd soft, NT, ND Normal phallus with bilateral descended testicles  Cystoscopy Procedure Note  Patient identification was confirmed, informed consent was obtained, and patient was prepped using Betadine solution.  Lidocaine jelly was administered per urethral meatus.     Pre-Procedure: - Inspection reveals a normal caliber urethral meatus.  Procedure: The flexible cystoscope was introduced without difficulty - No urethral strictures/lesions are present. - Moderate lateral lobe enlargement with hypervascularity prostate  - Mild elevation bladder neck - Bilateral ureteral orifices identified - Bladder mucosa  reveals no ulcers, tumors, or lesions -Posterior wall erythema noted -Scattered hypervascularity consistent with radiation change - No bladder stones - No trabeculation  Retroflexion shows no abnormalities   Post-Procedure: - Patient tolerated the procedure well  Assessment/ Plan: -Urine cytology sent -No evidence recurrent urothelial carcinoma  Return in about 6 months (around 09/20/2019) for Cysto.  Abbie Sons, MD

## 2019-03-24 LAB — URINALYSIS, COMPLETE
Bilirubin, UA: NEGATIVE
Ketones, UA: NEGATIVE
Leukocytes,UA: NEGATIVE
Nitrite, UA: NEGATIVE
Protein,UA: NEGATIVE
RBC, UA: NEGATIVE
Specific Gravity, UA: 1.025 (ref 1.005–1.030)
Urobilinogen, Ur: 0.2 mg/dL (ref 0.2–1.0)
pH, UA: 5.5 (ref 5.0–7.5)

## 2019-03-24 LAB — MICROSCOPIC EXAMINATION
Bacteria, UA: NONE SEEN
RBC, Urine: NONE SEEN /hpf (ref 0–2)

## 2019-03-27 ENCOUNTER — Encounter: Payer: Self-pay | Admitting: Urology

## 2019-03-28 ENCOUNTER — Other Ambulatory Visit: Payer: Self-pay | Admitting: Urology

## 2019-04-03 ENCOUNTER — Telehealth: Payer: Self-pay | Admitting: Urology

## 2019-04-03 NOTE — Telephone Encounter (Signed)
Urine cytology showed no abnormal cells 

## 2019-04-04 NOTE — Telephone Encounter (Signed)
Notified patient as instructed,Left message on home number DPR

## 2019-04-14 ENCOUNTER — Encounter: Payer: Self-pay | Admitting: Radiation Oncology

## 2019-06-01 ENCOUNTER — Other Ambulatory Visit: Payer: Self-pay | Admitting: *Deleted

## 2019-06-01 DIAGNOSIS — C61 Malignant neoplasm of prostate: Secondary | ICD-10-CM

## 2019-06-02 ENCOUNTER — Inpatient Hospital Stay: Payer: Medicare Other

## 2019-06-02 ENCOUNTER — Inpatient Hospital Stay: Payer: Medicare Other | Attending: Radiation Oncology

## 2019-06-02 ENCOUNTER — Other Ambulatory Visit: Payer: Self-pay

## 2019-06-02 DIAGNOSIS — Z5111 Encounter for antineoplastic chemotherapy: Secondary | ICD-10-CM | POA: Diagnosis not present

## 2019-06-02 DIAGNOSIS — C61 Malignant neoplasm of prostate: Secondary | ICD-10-CM | POA: Insufficient documentation

## 2019-06-02 LAB — PSA: Prostatic Specific Antigen: <0.01 ng/mL (ref 0.00–4.00)

## 2019-06-02 MED ORDER — LEUPROLIDE ACETATE (6 MONTH) 45 MG ~~LOC~~ KIT
45.0000 mg | PACK | Freq: Once | SUBCUTANEOUS | Status: AC
  Administered 2019-06-02: 45 mg via SUBCUTANEOUS
  Filled 2019-06-02: qty 45

## 2019-06-09 ENCOUNTER — Other Ambulatory Visit: Payer: Self-pay

## 2019-06-09 ENCOUNTER — Ambulatory Visit
Admission: RE | Admit: 2019-06-09 | Discharge: 2019-06-09 | Disposition: A | Discharge status: Home / Self Care | Payer: Medicare Other | Source: Ambulatory Visit | Attending: Radiation Oncology | Admitting: Radiation Oncology

## 2019-06-09 ENCOUNTER — Encounter: Payer: Self-pay | Admitting: Radiation Oncology

## 2019-06-09 VITALS — BP 126/75 | HR 69 | Temp 94.4°F | Wt 207.4 lb

## 2019-06-09 DIAGNOSIS — C61 Malignant neoplasm of prostate: Secondary | ICD-10-CM

## 2019-06-09 NOTE — Progress Notes (Signed)
Radiation Oncology Follow up Note  Name: [name]   Date:   06/09/2019 MRN:  [id number] DOB: 1945/02/28    This 74 y.o. male presents to the clinic today for 49-month follow-up status post IMRT radiation therapy for Gleason 7 (4+3 adenocarcinoma the prostate.  REFERRING PROVIDER: Baxter Hire, MD  HPI: Patient is a 74 year old male now out 10 months having completed IMRT radiation therapy for Gleason 7 adenocarcinoma the prostate.Marland Kitchen  He originally presented with a PSA of 17 seen today in routine follow-up is doing well states his bowels he has about 2 bowel movements a day not diarrhea.  He is taking some probiotics.  He also has some urinary frequency and nocturia I have asked him to start taking his Flomax at night after dinner.  His most current PSA this month is less than 0.01 he had a Eligard injection on 0000000  COMPLICATIONS OF TREATMENT: none  FOLLOW UP COMPLIANCE: keeps appointments   PHYSICAL EXAM:  BP 126/75 (BP Location: Left Arm, Patient Position: Sitting, Cuff Size: Normal)   Pulse 69   Temp (!) 94.4 F (34.7 C) (Tympanic)   Wt 207 lb 6 oz (94.1 kg)   BMI 33.47 kg/m  Well-developed well-nourished patient in NAD. HEENT reveals PERLA, EOMI, discs not visualized.  Oral cavity is clear. No oral mucosal lesions are identified. Neck is clear without evidence of cervical or supraclavicular adenopathy. Lungs are clear to A&P. Cardiac examination is essentially unremarkable with regular rate and rhythm without murmur rub or thrill. Abdomen is benign with no organomegaly or masses noted. Motor sensory and DTR levels are equal and symmetric in the upper and lower extremities. Cranial nerves II through XII are grossly intact. Proprioception is intact. No peripheral adenopathy or edema is identified. No motor or sensory levels are noted. Crude visual fields are within normal range.  RADIOLOGY RESULTS: No current films to review  PLAN: Present time patient is under excellent  biochemical control of his prostate cancer.  I am pleased with his overall progress.  I have asked to see him back in 6 months for follow-up with a PSA prior to the visit.  Patient knows to call with any concerns.  I would like to take this opportunity to thank you for allowing me to participate in the care of your patient.[name], MD

## 2019-06-22 ENCOUNTER — Other Ambulatory Visit: Payer: Self-pay | Admitting: Radiation Oncology

## 2019-07-19 DIAGNOSIS — K644 Residual hemorrhoidal skin tags: Secondary | ICD-10-CM | POA: Insufficient documentation

## 2019-07-21 ENCOUNTER — Other Ambulatory Visit: Payer: Self-pay | Admitting: Internal Medicine

## 2019-07-21 ENCOUNTER — Other Ambulatory Visit: Payer: Self-pay | Admitting: Neurosurgery

## 2019-07-21 ENCOUNTER — Other Ambulatory Visit (HOSPITAL_COMMUNITY): Payer: Self-pay | Admitting: Neurosurgery

## 2019-07-21 DIAGNOSIS — G930 Cerebral cysts: Secondary | ICD-10-CM

## 2019-07-22 ENCOUNTER — Other Ambulatory Visit: Payer: Self-pay | Admitting: Pediatrics

## 2019-07-22 DIAGNOSIS — I714 Abdominal aortic aneurysm, without rupture, unspecified: Secondary | ICD-10-CM

## 2019-07-28 ENCOUNTER — Other Ambulatory Visit: Payer: Self-pay

## 2019-07-28 ENCOUNTER — Ambulatory Visit
Admission: RE | Admit: 2019-07-28 | Discharge: 2019-07-28 | Disposition: A | Discharge status: Home / Self Care | Payer: Medicare Other | Source: Ambulatory Visit | Attending: Pediatrics | Admitting: Pediatrics

## 2019-07-28 DIAGNOSIS — I714 Abdominal aortic aneurysm, without rupture, unspecified: Secondary | ICD-10-CM

## 2019-07-28 LAB — POCT I-STAT CREATININE: Creatinine, Ser: 1 mg/dL (ref 0.61–1.24)

## 2019-07-28 MED ORDER — IOHEXOL 350 MG/ML SOLN
100.0000 mL | Freq: Once | INTRAVENOUS | Status: DC | PRN
  Administered 2019-07-28: 100 mL via INTRAVENOUS

## 2019-07-29 ENCOUNTER — Other Ambulatory Visit: Payer: Self-pay | Admitting: Internal Medicine

## 2019-07-29 DIAGNOSIS — I714 Abdominal aortic aneurysm, without rupture, unspecified: Secondary | ICD-10-CM

## 2019-07-29 MED ORDER — IOHEXOL 350 MG/ML SOLN
100.0000 mL | Freq: Once | INTRAVENOUS | Status: AC | PRN
  Administered 2019-07-28: 100 mL via INTRAVENOUS

## 2019-09-21 ENCOUNTER — Ambulatory Visit (INDEPENDENT_AMBULATORY_CARE_PROVIDER_SITE_OTHER): Payer: Medicare Other | Admitting: Urology

## 2019-09-21 ENCOUNTER — Other Ambulatory Visit: Payer: Self-pay

## 2019-09-21 ENCOUNTER — Encounter: Payer: Self-pay | Admitting: Urology

## 2019-09-21 VITALS — BP 135/80 | HR 72 | Ht 69.0 in | Wt 206.0 lb

## 2019-09-21 DIAGNOSIS — Z8551 Personal history of malignant neoplasm of bladder: Secondary | ICD-10-CM | POA: Diagnosis not present

## 2019-09-21 NOTE — Progress Notes (Signed)
   09/21/19  CC:  Chief Complaint  Patient presents with  . Cysto    Urologic history: 1.  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  Intermediate risk adenocarcinoma prostate -Treated IMRT+ ADT -PSA 05/2019 <0.01  HPI:  Blood pressure 135/80, pulse 72, height 5\' 9"  (1.753 m), weight 206 lb (93.4 kg). NED. A&Ox3.   No respiratory distress   Abd soft, NT, ND Normal phallus with bilateral descended testicles  Cystoscopy Procedure Note  Patient identification was confirmed, informed consent was obtained, and patient was prepped using Betadine solution.  Lidocaine jelly was administered per urethral meatus.     Pre-Procedure: - Inspection reveals a normal caliber urethral meatus.  Procedure: The flexible cystoscope was introduced without difficulty - No urethral strictures/lesions are present. - Moderate lateral lobe enlargement with hypervascularity prostate  - Mild elevation bladder neck - Bilateral ureteral orifices identified - Bladder mucosa  reveals no ulcers, tumors, or lesions -Previously noted area posterior wall erythema not noted -Bladder wall hypervascularity present - No bladder stones - No trabeculation  Retroflexion shows no abnormalities  Post-Procedure: - Patient tolerated the procedure well  Assessment/ Plan:  No evidence recurrent bladder tumor  Last recurrence June/2017  Continue every 6 month surveillance  He did inquire about nocturia treatments.  We did discuss Nocdurna however he wanted to hold off at this time   Abbie Sons, MD

## 2019-09-23 LAB — URINALYSIS, COMPLETE
Bilirubin, UA: NEGATIVE
Glucose, UA: NEGATIVE
Ketones, UA: NEGATIVE
Leukocytes,UA: NEGATIVE
Nitrite, UA: NEGATIVE
Protein,UA: NEGATIVE
Specific Gravity, UA: 1.025 (ref 1.005–1.030)
Urobilinogen, Ur: 0.2 mg/dL (ref 0.2–1.0)
pH, UA: 6.5 (ref 5.0–7.5)

## 2019-09-23 LAB — MICROSCOPIC EXAMINATION: Bacteria, UA: NONE SEEN

## 2019-11-14 ENCOUNTER — Other Ambulatory Visit: Payer: Medicare Other

## 2019-11-14 DIAGNOSIS — Z20822 Contact with and (suspected) exposure to covid-19: Secondary | ICD-10-CM

## 2019-11-15 LAB — SARS-COV-2, NAA 2 DAY TAT

## 2019-11-15 LAB — NOVEL CORONAVIRUS, NAA: SARS-CoV-2, NAA: NOT DETECTED

## 2019-11-25 ENCOUNTER — Encounter: Payer: Self-pay | Admitting: Radiation Oncology

## 2019-12-08 ENCOUNTER — Other Ambulatory Visit: Payer: Medicare Other

## 2019-12-15 ENCOUNTER — Ambulatory Visit: Payer: Medicare Other | Admitting: Radiation Oncology

## 2019-12-19 ENCOUNTER — Encounter: Payer: Self-pay | Admitting: Urology

## 2019-12-22 ENCOUNTER — Inpatient Hospital Stay: Payer: Medicare Other | Attending: Radiation Oncology

## 2019-12-22 DIAGNOSIS — C61 Malignant neoplasm of prostate: Secondary | ICD-10-CM | POA: Insufficient documentation

## 2019-12-24 LAB — PROSTATE-SPECIFIC AG, SERUM (LABCORP): Prostate Specific Ag, Serum: <0.1 ng/mL (ref 0.0–4.0)

## 2019-12-28 ENCOUNTER — Encounter: Payer: Self-pay | Admitting: Radiation Oncology

## 2019-12-29 ENCOUNTER — Encounter: Payer: Self-pay | Admitting: Radiation Oncology

## 2019-12-29 ENCOUNTER — Ambulatory Visit
Admission: RE | Admit: 2019-12-29 | Discharge: 2019-12-29 | Disposition: A | Discharge status: Home / Self Care | Payer: Medicare Other | Source: Ambulatory Visit | Attending: Radiation Oncology | Admitting: Radiation Oncology

## 2019-12-29 VITALS — BP 118/71 | HR 71 | Temp 97.3°F | Wt 205.9 lb

## 2019-12-29 DIAGNOSIS — Z923 Personal history of irradiation: Secondary | ICD-10-CM | POA: Insufficient documentation

## 2019-12-29 DIAGNOSIS — C61 Malignant neoplasm of prostate: Secondary | ICD-10-CM | POA: Insufficient documentation

## 2019-12-29 NOTE — Progress Notes (Signed)
Radiation Oncology Follow up Note  Name: [name]   Date:   12/29/2019 MRN:  [id number] DOB: January 16, 1946    This 74 y.o. male presents to the clinic today for 1-month follow-up status post IMRT radiation therapy for Gleason 7 (4+3) adenocarcinoma the prostate.  REFERRING PROVIDER: Baxter Hire, MD  HPI: Patient is a 74 year old male now out 18 months having completed IMRT radiation therapy to his prostate for Gleason 7 adenocarcinoma originally presenting with a PSA of 17 he is seen today in routine follow-up and is doing fairly well he still has urinary frequency urgency and nocturia x4.  He had been taking Flomax although has discontinued that.  He states he does take medication which causes urinary frequency.Marland Kitchen  He is also being followed by Dr. 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  Multiple repeat cystoscopies have been negative.  His most current PSA is less than 0.1  COMPLICATIONS OF TREATMENT: none  FOLLOW UP COMPLIANCE: keeps appointments   PHYSICAL EXAM:  BP 118/71 (BP Location: Left Arm, Patient Position: Sitting, Cuff Size: Large)   Pulse 71   Temp (!) 97.3 F (36.3 C) (Tympanic)   Wt 205 lb 14.4 oz (93.4 kg)   BMI 30.41 kg/m  Well-developed well-nourished patient in NAD. HEENT reveals PERLA, EOMI, discs not visualized.  Oral cavity is clear. No oral mucosal lesions are identified. Neck is clear without evidence of cervical or supraclavicular adenopathy. Lungs are clear to A&P. Cardiac examination is essentially unremarkable with regular rate and rhythm without murmur rub or thrill. Abdomen is benign with no organomegaly or masses noted. Motor sensory and DTR levels are equal and symmetric in the upper and lower extremities. Cranial nerves II through XII are grossly intact. Proprioception is intact. No peripheral adenopathy or edema is identified. No motor or sensory levels are noted. Crude visual fields are within normal  range.  RADIOLOGY RESULTS: Recent CT scan which I have reviewed shows a 3 cm infrarenal abdominal aortic aneurysm he also has left common iliac artery aneurysms.  Also has left SFA origin stenosis.  PLAN: Present time patient is doing well under excellent biochemical control of his prostate cancer.  His last Eligard was back in April.  I believe we are now close to 2 years out based on his cardiothoracic history he does have stents further Eligard would not be indicated at this time.  I have asked to see him back in 1 year for follow-up.  He continues close follow-up care both for his prostate cancer and early stage bladder cancer with Dr. Bernardo Heater.  Patient knows to call with any concerns.  I would like to take this opportunity to thank you for allowing me to participate in the care of your patient.[name], MD

## 2020-01-26 ENCOUNTER — Encounter: Payer: Self-pay | Admitting: Urology

## 2020-02-01 ENCOUNTER — Other Ambulatory Visit: Payer: Self-pay

## 2020-02-01 ENCOUNTER — Other Ambulatory Visit: Payer: Medicare Other

## 2020-02-01 ENCOUNTER — Telehealth: Payer: Self-pay | Admitting: Family Medicine

## 2020-02-01 DIAGNOSIS — Z87440 Personal history of urinary (tract) infections: Secondary | ICD-10-CM

## 2020-02-01 NOTE — Telephone Encounter (Signed)
Patient called and received the Mychart message about taking tamsulosin. He will start the medication today. He also scheduled a lab appointment for a UA per [facility].

## 2020-02-02 ENCOUNTER — Telehealth: Payer: Self-pay | Admitting: *Deleted

## 2020-02-02 ENCOUNTER — Encounter: Payer: Self-pay | Admitting: Urology

## 2020-02-02 ENCOUNTER — Other Ambulatory Visit: Payer: Self-pay | Admitting: *Deleted

## 2020-02-02 LAB — URINALYSIS, COMPLETE
Bilirubin, UA: NEGATIVE
Glucose, UA: NEGATIVE
Ketones, UA: NEGATIVE
Leukocytes,UA: NEGATIVE
Nitrite, UA: NEGATIVE
Protein,UA: NEGATIVE
Specific Gravity, UA: 1.025 (ref 1.005–1.030)
Urobilinogen, Ur: 0.2 mg/dL (ref 0.2–1.0)
pH, UA: 7 (ref 5.0–7.5)

## 2020-02-02 LAB — MICROSCOPIC EXAMINATION: Bacteria, UA: NONE SEEN

## 2020-02-02 NOTE — Telephone Encounter (Signed)
Notified patient as instructed, patient pleased. Discussed follow-up appointments, patient agrees  

## 2020-02-02 NOTE — Telephone Encounter (Signed)
-----   Message from Abbie Sons, MD sent at 02/02/2020  8:39 AM EST ----- Urinalysis showed a small amount of blood but no evidence of infection

## 2020-02-02 NOTE — Telephone Encounter (Signed)
Patient want to try Tamsulosin again . He will call back and let us know where to sent it too.

## 2020-02-07 ENCOUNTER — Encounter: Payer: Self-pay | Admitting: Urology

## 2020-02-11 LAB — CULTURE, URINE COMPREHENSIVE

## 2020-02-12 ENCOUNTER — Encounter: Payer: Self-pay | Admitting: Urology

## 2020-02-13 ENCOUNTER — Telehealth: Payer: Self-pay

## 2020-02-13 ENCOUNTER — Other Ambulatory Visit: Payer: Self-pay | Admitting: Urology

## 2020-02-13 MED ORDER — CEPHALEXIN 500 MG PO CAPS: 500.0000 mg | ORAL_CAPSULE | Freq: Two times a day (BID) | ORAL | 0 refills | Status: DC

## 2020-02-13 NOTE — Telephone Encounter (Signed)
Pt calls triage line and states he is inquiring about results he received on mychart. Reiterated result note to pt, advised him to pick up RX for antibiotics. Pt voiced understanding.

## 2020-02-14 ENCOUNTER — Encounter: Payer: Self-pay | Admitting: Urology

## 2020-02-16 ENCOUNTER — Telehealth: Payer: Self-pay

## 2020-02-16 NOTE — Telephone Encounter (Signed)
Patient is having diarrhea. Advised patient to take antibiotic on a full stomach and add a probiotic. Pt states he is taking imodium.. Advised patient to DC the imodium . Pt verbalized understanding . Pt states his bowels have had a foul smell.[name]

## 2020-02-20 ENCOUNTER — Encounter: Payer: Self-pay | Admitting: Urology

## 2020-02-22 ENCOUNTER — Encounter: Payer: Self-pay | Admitting: Urology

## 2020-03-02 ENCOUNTER — Other Ambulatory Visit: Payer: Self-pay | Admitting: Family Medicine

## 2020-03-02 ENCOUNTER — Encounter: Payer: Self-pay | Admitting: Urology

## 2020-03-02 MED ORDER — TAMSULOSIN HCL 0.4 MG PO CAPS: 0.4000 mg | ORAL_CAPSULE | Freq: Every day | ORAL | 3 refills | Status: DC

## 2020-03-15 ENCOUNTER — Encounter: Payer: Self-pay | Admitting: Urology

## 2020-03-19 ENCOUNTER — Encounter: Payer: Self-pay | Admitting: Urology

## 2020-03-23 ENCOUNTER — Other Ambulatory Visit: Payer: Self-pay | Admitting: Urology

## 2020-03-29 ENCOUNTER — Other Ambulatory Visit: Payer: Medicare Other | Admitting: Urology

## 2020-03-29 DIAGNOSIS — Z8616 Personal history of COVID-19: Secondary | ICD-10-CM

## 2020-03-29 HISTORY — DX: Personal history of COVID-19: Z86.16

## 2020-03-30 ENCOUNTER — Encounter: Payer: Self-pay | Admitting: Urology

## 2020-04-12 ENCOUNTER — Encounter: Payer: Self-pay | Admitting: Urology

## 2020-04-12 ENCOUNTER — Other Ambulatory Visit: Payer: Self-pay

## 2020-04-12 ENCOUNTER — Ambulatory Visit (INDEPENDENT_AMBULATORY_CARE_PROVIDER_SITE_OTHER): Payer: Medicare Other | Admitting: Urology

## 2020-04-12 VITALS — BP 114/74 | HR 84 | Ht 66.0 in | Wt 206.0 lb

## 2020-04-12 DIAGNOSIS — C679 Malignant neoplasm of bladder, unspecified: Secondary | ICD-10-CM

## 2020-04-12 NOTE — Progress Notes (Signed)
   04/12/20  CC:  Chief Complaint  Patient presents with  . 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 male presents for surveillance cystoscopy.  Post radiation he has developed bothersome storage related voiding symptoms.  Mild improvement on tamsulosin.  Blood pressure 114/74, pulse 84, height 5\' 6"  (1.676 m), weight 206 lb (93.4 kg). NED. A&Ox3.   No respiratory distress   Abd soft, NT, ND Normal phallus with bilateral descended testicles  Cystoscopy Procedure Note  Patient identification was confirmed, informed consent was obtained, and patient was prepped using Betadine solution.  Lidocaine jelly was administered per urethral meatus.     Pre-Procedure: - Inspection reveals a normal caliber ureteral meatus.  Procedure: The flexible cystoscope was introduced without difficulty - No urethral strictures/lesions are present. - Moderate lateral lobe enlargement with hypervascularity prostate  - Mild elevation bladder neck - Bilateral ureteral orifices identified - Bladder mucosa  reveals area of erythema left lateral wall near bladder neck - No bladder stones - No trabeculation  Retroflexion shows no abnormalities   Post-Procedure: - Patient tolerated the procedure well  Assessment/ Plan:  Mild bladder wall erythema which may be secondary to radiation change.  Urine cytology sent and if negative follow-up surveillance cystoscopy 6 months  Add Myrbetriq 50 mg daily to tamsulosin-samples given  Call back regarding efficacy   Abbie Sons, MD

## 2020-04-13 ENCOUNTER — Encounter: Payer: Self-pay | Admitting: Urology

## 2020-04-13 LAB — URINALYSIS, COMPLETE
Bilirubin, UA: NEGATIVE
Glucose, UA: NEGATIVE
Ketones, UA: NEGATIVE
Leukocytes,UA: NEGATIVE
Nitrite, UA: NEGATIVE
Protein,UA: NEGATIVE
Specific Gravity, UA: 1.02 (ref 1.005–1.030)
Urobilinogen, Ur: 0.2 mg/dL (ref 0.2–1.0)
pH, UA: 7 (ref 5.0–7.5)

## 2020-04-13 LAB — MICROSCOPIC EXAMINATION
Bacteria, UA: NONE SEEN
RBC, Urine: >30 /hpf — AB (ref 0–2)

## 2020-04-14 ENCOUNTER — Encounter: Payer: Self-pay | Admitting: Urology

## 2020-04-16 LAB — CYTOLOGY - NON PAP

## 2020-04-17 ENCOUNTER — Encounter: Payer: Self-pay | Admitting: Urology

## 2020-04-19 ENCOUNTER — Encounter: Payer: Self-pay | Admitting: Urology

## 2020-04-20 ENCOUNTER — Encounter: Payer: Self-pay | Admitting: Urology

## 2020-04-21 ENCOUNTER — Encounter: Payer: Self-pay | Admitting: Urology

## 2020-05-29 ENCOUNTER — Encounter: Payer: Self-pay | Admitting: Urology

## 2020-06-21 ENCOUNTER — Encounter: Payer: Self-pay | Admitting: Urology

## 2020-06-25 ENCOUNTER — Ambulatory Visit: Payer: Self-pay | Admitting: *Deleted

## 2020-06-25 NOTE — Telephone Encounter (Signed)
Patient and his wife called to confirm 2nd booster vaccination for tomorrow at 11:30 at [facility]. Patient reports he and his wife received a text message to confirm appt but have not received email at this time. Patient reports text message gave # to call and a link  for questions and this NT received the call. Unable to see confirmed appt in patient's chart that he is scheduled for tomorrow for vaccination. Instructed patient to go to scheduled time tomorrow due to receiving text confirmation. . Patient reports he has had multiple issues with his my chart information. Patient verbalized understanding of going to get booster vaccination tomorrow at 11:30 at [facility] .

## 2020-07-11 ENCOUNTER — Other Ambulatory Visit: Payer: Self-pay | Admitting: Urology

## 2020-08-17 IMAGING — MR MR PELVIS W/O CM
4 of 5 series · 27 of 48 positions shown · non-contrast
Comparison: 06/22/2008

CLINICAL DATA: Buttock and groin pain, left greater than right.

EXAM:
MRI PELVIS WITHOUT CONTRAST
TECHNIQUE: Multiplanar multisequence MR imaging of the pelvis was performed. No
intravenous contrast was administered.

[Series 11: STIR · coronal · 4.0mm · 1.25mm/px · 7 of 42 slices shown]
[im 1/42]
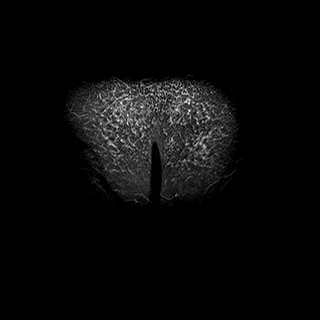
[im 7/42]
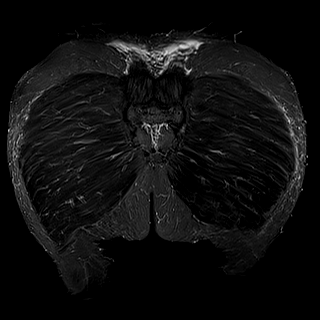
[im 14/42]
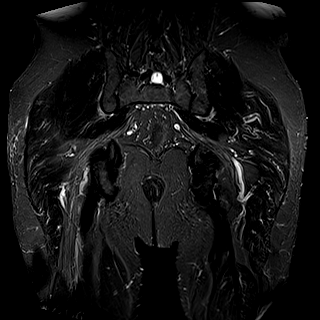
[im 21/42]
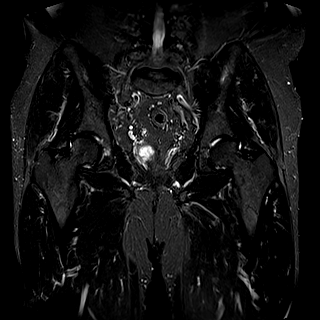
[im 28/42]
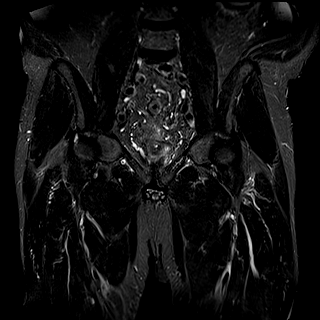
[im 35/42]
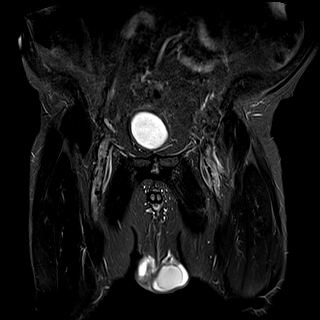
[im 42/42]
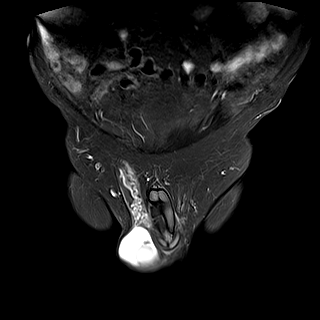

[Series 12: T1 · axial · 4.0mm · 0.74mm/px · z∈[-94,+161]mm · 8 of 52 slices shown (1 of 2)]
[im 1/52]
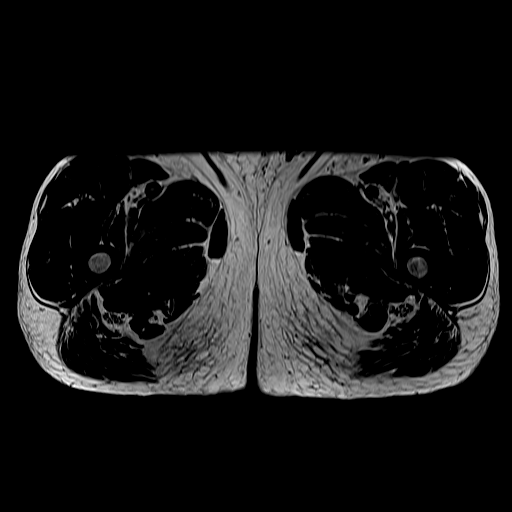
[im 6/52]
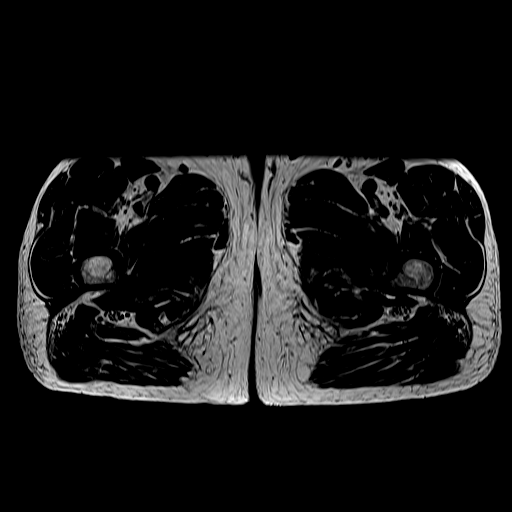
[im 18/52]
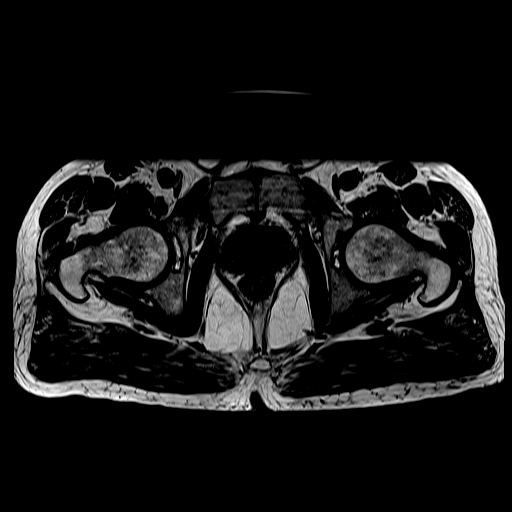
[im 23/52]
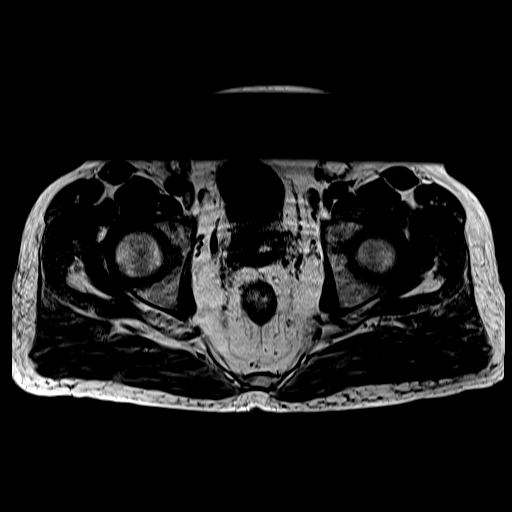
[im 29/52]
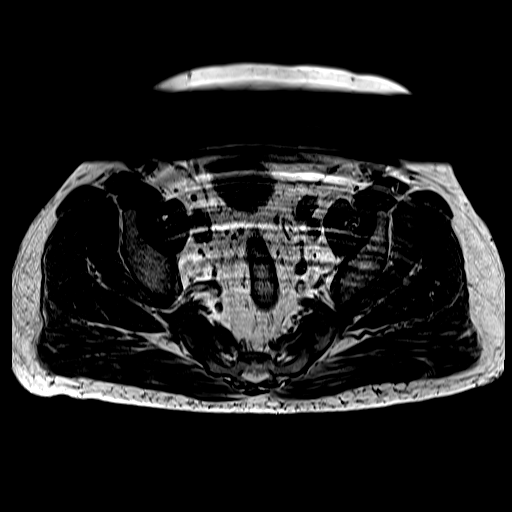
[im 35/52]
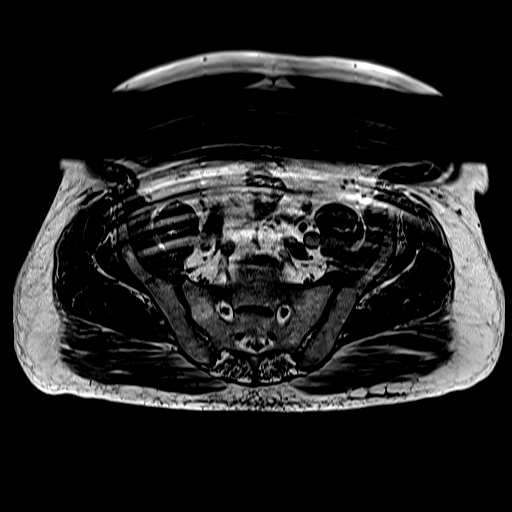
[im 46/52]
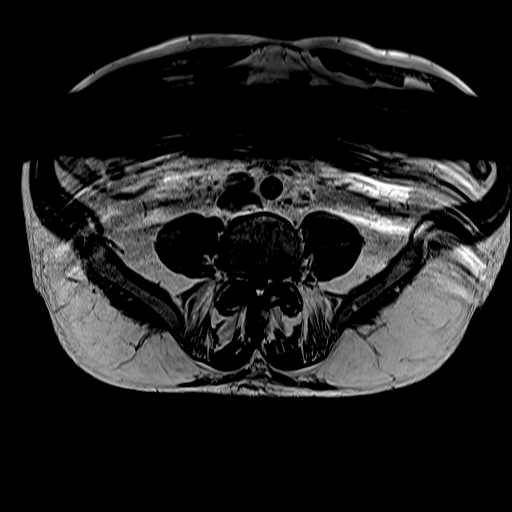
[im 52/52]
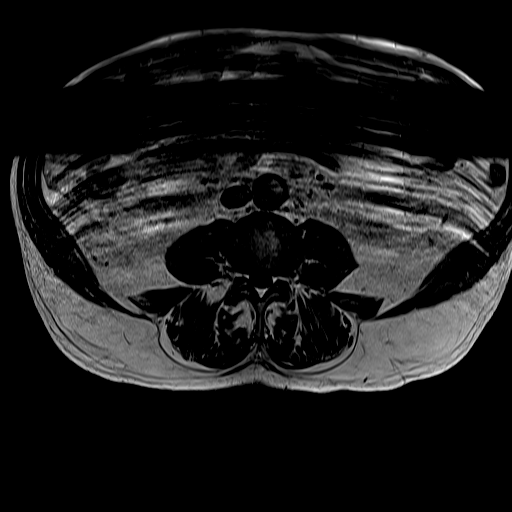

[Series 13: T1 · coronal · 4.0mm · 1.25mm/px · 8 of 42 slices shown (2 of 2)]
[im 1/42]
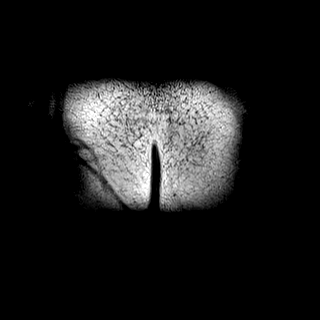
[im 6/42]
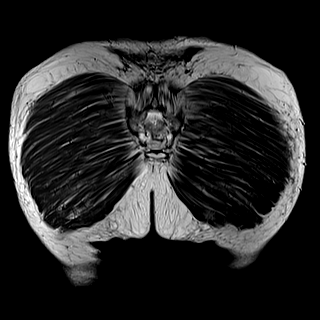
[im 12/42]
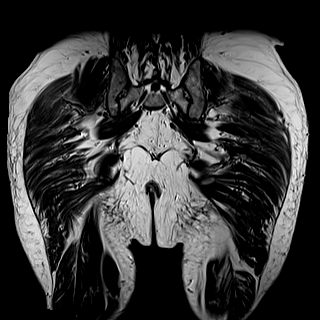
[im 18/42]
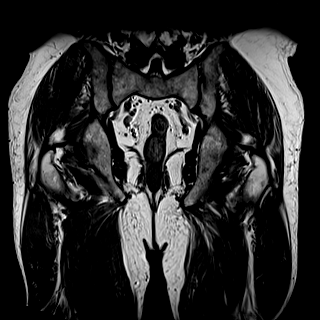
[im 24/42]
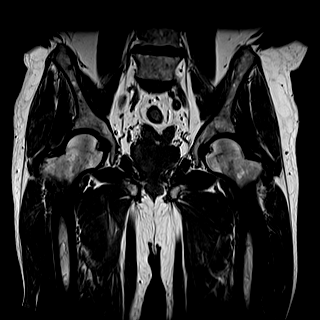
[im 30/42]
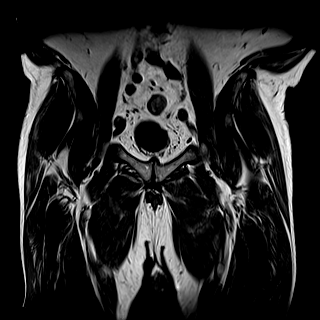
[im 36/42]
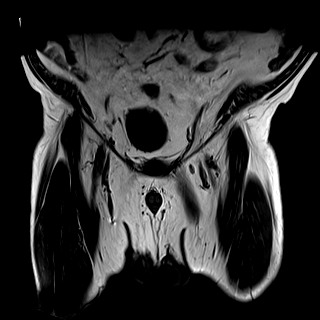
[im 42/42]
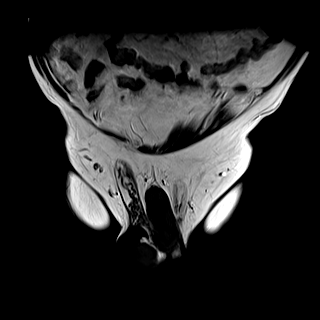

[Series 14: T2 fat-sat · axial · 4.0mm · 0.74mm/px · z∈[-94,+131]mm · 4 of 52 slices shown]
[im 1/52]
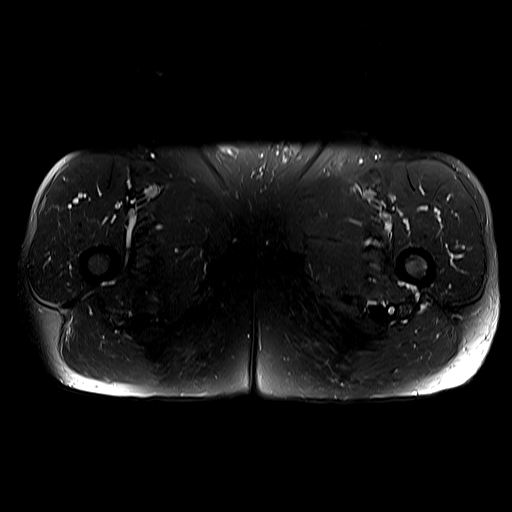
[im 6/52]
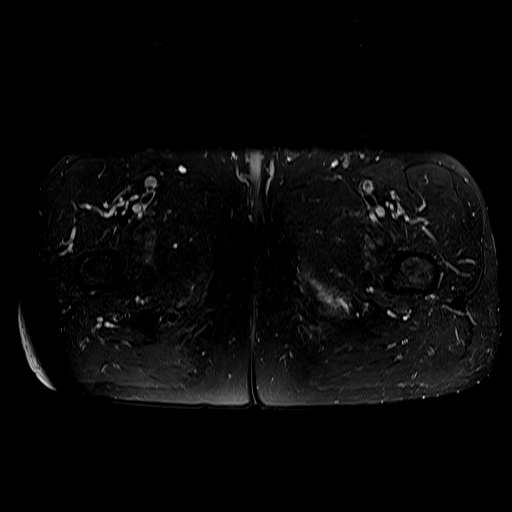
[im 29/52]
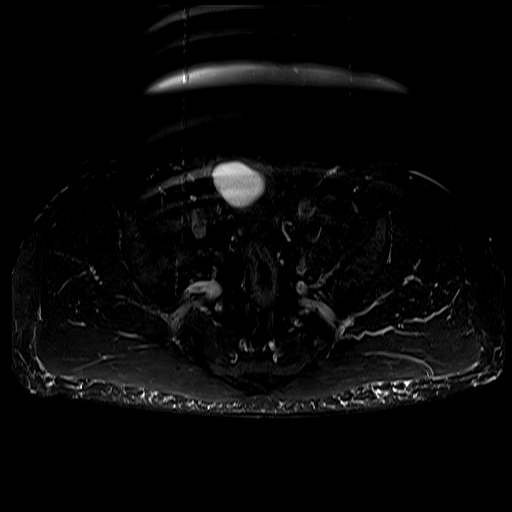
[im 46/52]
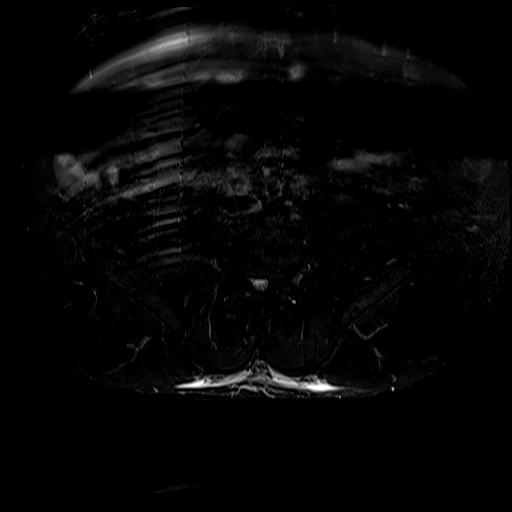

[27 of 48 positions shown; findings below may reference images not displayed]

FINDINGS: Osseous structures: There is evidence of lumbar degenerative disc
disease. Small subcortical cyst or mild degenerative subcortical
marrow edema anteriorly along the right acetabular roof. Mildly
accentuated T2 signal along the anterior pubic symphysis. Lower
lumbar degenerative facet arthropathy. Suspected foraminal
impingement bilaterally at L4-5.

Musculotendinous: Asymmetric partial tearing of the left hamstring
tendon with fluid signal in this vicinity as shown on images 14
through 18 of series 11. There is likely a small chronic ossicle
along the proximal semimembranosus tendon. No asymmetric muscular
atrophy.

Joints: No joint effusion.

Other: Mild prostatomegaly. There is some reduced T2 signal in the
posteromedial peripheral zone in left mid gland and in the lateral
peripheral zone of the left apex which is nonspecific. No regional
bursitis.
IMPRESSION: 1. Asymmetric partial tearing of the proximal hamstring tendons,
significantly greater on the left than the right.
2. Lower lumbar facet arthropathy and degenerative disc disease
likely with bilateral foraminal impingement at L4-5.
3. Mild prostatomegaly, with reduced T2 signal in the peripheral
zone of the left mid gland and apex which is nonspecific but which
could conceivably represent prostate cancer. Correlate with PSA
level in determining need for further workup.

## 2020-08-29 ENCOUNTER — Encounter: Payer: Self-pay | Admitting: Urology

## 2020-08-29 ENCOUNTER — Other Ambulatory Visit: Payer: Self-pay | Admitting: *Deleted

## 2020-08-29 MED ORDER — TAMSULOSIN HCL 0.4 MG PO CAPS: 0.4000 mg | ORAL_CAPSULE | Freq: Every day | ORAL | 3 refills | Status: DC

## 2020-09-18 DIAGNOSIS — I714 Abdominal aortic aneurysm, without rupture, unspecified: Secondary | ICD-10-CM | POA: Insufficient documentation

## 2020-10-01 ENCOUNTER — Encounter: Payer: Self-pay | Admitting: Urology

## 2020-10-02 ENCOUNTER — Encounter: Payer: Self-pay | Admitting: Urology

## 2020-10-03 ENCOUNTER — Other Ambulatory Visit: Payer: Medicare Other

## 2020-10-03 ENCOUNTER — Other Ambulatory Visit: Payer: Self-pay

## 2020-10-03 DIAGNOSIS — C679 Malignant neoplasm of bladder, unspecified: Secondary | ICD-10-CM

## 2020-10-04 ENCOUNTER — Encounter: Payer: Self-pay | Admitting: Urology

## 2020-10-04 LAB — URINALYSIS, COMPLETE
Bilirubin, UA: NEGATIVE
Glucose, UA: NEGATIVE
Ketones, UA: NEGATIVE
Leukocytes,UA: NEGATIVE
Nitrite, UA: NEGATIVE
Specific Gravity, UA: 1.025 (ref 1.005–1.030)
Urobilinogen, Ur: 0.2 mg/dL (ref 0.2–1.0)
pH, UA: 7 (ref 5.0–7.5)

## 2020-10-04 LAB — MICROSCOPIC EXAMINATION
Bacteria, UA: NONE SEEN
RBC, Urine: >30 /hpf — AB (ref 0–2)

## 2020-10-06 ENCOUNTER — Encounter: Payer: Self-pay | Admitting: Urology

## 2020-10-06 LAB — CULTURE, URINE COMPREHENSIVE

## 2020-10-08 ENCOUNTER — Telehealth: Payer: Self-pay | Admitting: *Deleted

## 2020-10-08 MED ORDER — SULFAMETHOXAZOLE-TRIMETHOPRIM 800-160 MG PO TABS: 1.0000 | ORAL_TABLET | Freq: Two times a day (BID) | ORAL | 0 refills | Status: DC

## 2020-10-08 NOTE — Telephone Encounter (Signed)
Notified patient as instructed, patient pleased. Discussed follow-up appointments, patient agrees  

## 2020-10-08 NOTE — Telephone Encounter (Signed)
-----   Message from Nori Riis, PA-C sent at 10/08/2020  8:58 AM EDT ----- Please let [name] know that his urine culture was positive and he needs to start Septra DS, twice daily for seven days.

## 2020-10-11 ENCOUNTER — Other Ambulatory Visit: Payer: Self-pay | Admitting: Urology

## 2020-10-17 ENCOUNTER — Other Ambulatory Visit: Payer: Self-pay

## 2020-10-17 ENCOUNTER — Encounter: Payer: Self-pay | Admitting: Urology

## 2020-10-17 ENCOUNTER — Ambulatory Visit (INDEPENDENT_AMBULATORY_CARE_PROVIDER_SITE_OTHER): Payer: Medicare Other | Admitting: Urology

## 2020-10-17 VITALS — BP 125/70 | HR 74 | Ht 66.0 in | Wt 204.0 lb

## 2020-10-17 DIAGNOSIS — C679 Malignant neoplasm of bladder, unspecified: Secondary | ICD-10-CM

## 2020-10-17 DIAGNOSIS — Z87440 Personal history of urinary (tract) infections: Secondary | ICD-10-CM

## 2020-10-18 ENCOUNTER — Encounter: Payer: Self-pay | Admitting: Urology

## 2020-10-18 NOTE — H&P (View-Only) (Signed)
   10/18/20  CC:  Chief Complaint  Patient presents with   Cysto    Urologic history: 1.  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  Intermediate risk adenocarcinoma prostate -Treated IMRT+ ADT -PSA 05/2019 <0.01   HPI: 75 y.o. presents for surveillance cystoscopy.  Earlier this month he developed gross hematuria.  Urine culture + E. coli and he completed a course of Septra DS with resolution of his hematuria  Blood pressure 125/70, pulse 74, height '5\' 6"'$  (1.676 m), weight 204 lb (92.5 kg). NED. A&Ox3.   No respiratory distress   Abd soft, NT, ND Normal phallus with bilateral descended testicles  Cystoscopy Procedure Note  Patient identification was confirmed, informed consent was obtained, and patient was prepped using Betadine solution.  Lidocaine jelly was administered per urethral meatus.     Pre-Procedure: - Inspection reveals a normal caliber urethral meatus.  Procedure: The flexible cystoscope was introduced without difficulty - No urethral strictures/lesions are present. -Moderate lateral lobe enlargement prostate  -Mild elevation bladder neck - Bilateral ureteral orifices identified - Bladder mucosa  reveals ~ 1 cm papillary lesion posterior wall with several adjacent satellite lesions - No bladder stones - No trabeculation  Retroflexion shows no abnormalities   Post-Procedure: - Patient tolerated the procedure well  Assessment/ Plan: Recurrent bladder tumor endoscopically consistent with urothelial carcinoma Findings were discussed and recommend TURBT The procedure was discussed including potential risks of bleeding, infection and bladder injury All questions were answered and he desires to schedule    Abbie Sons, MD

## 2020-10-18 NOTE — Progress Notes (Signed)
   10/18/20  CC:  Chief Complaint  Patient presents with   Cysto    Urologic history: 1.  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  Intermediate risk adenocarcinoma prostate -Treated IMRT+ ADT -PSA 05/2019 <0.01   HPI: 75 y.o. presents for surveillance cystoscopy.  Earlier this month he developed gross hematuria.  Urine culture + E. coli and he completed a course of Septra DS with resolution of his hematuria  Blood pressure 125/70, pulse 74, height '5\' 6"'$  (1.676 m), weight 204 lb (92.5 kg). NED. A&Ox3.   No respiratory distress   Abd soft, NT, ND Normal phallus with bilateral descended testicles  Cystoscopy Procedure Note  Patient identification was confirmed, informed consent was obtained, and patient was prepped using Betadine solution.  Lidocaine jelly was administered per urethral meatus.     Pre-Procedure: - Inspection reveals a normal caliber urethral meatus.  Procedure: The flexible cystoscope was introduced without difficulty - No urethral strictures/lesions are present. -Moderate lateral lobe enlargement prostate  -Mild elevation bladder neck - Bilateral ureteral orifices identified - Bladder mucosa  reveals ~ 1 cm papillary lesion posterior wall with several adjacent satellite lesions - No bladder stones - No trabeculation  Retroflexion shows no abnormalities   Post-Procedure: - Patient tolerated the procedure well  Assessment/ Plan: Recurrent bladder tumor endoscopically consistent with urothelial carcinoma Findings were discussed and recommend TURBT The procedure was discussed including potential risks of bleeding, infection and bladder injury All questions were answered and he desires to schedule    Abbie Sons, MD

## 2020-10-19 ENCOUNTER — Encounter: Payer: Self-pay | Admitting: Urology

## 2020-10-19 LAB — URINALYSIS, COMPLETE
Bilirubin, UA: NEGATIVE
Glucose, UA: NEGATIVE
Ketones, UA: NEGATIVE
Leukocytes,UA: NEGATIVE
Nitrite, UA: NEGATIVE
Specific Gravity, UA: 1.025 (ref 1.005–1.030)
Urobilinogen, Ur: 0.2 mg/dL (ref 0.2–1.0)
pH, UA: 7 (ref 5.0–7.5)

## 2020-10-19 LAB — MICROSCOPIC EXAMINATION
Bacteria, UA: NONE SEEN
RBC, Urine: >30 /hpf — AB (ref 0–2)

## 2020-10-24 ENCOUNTER — Encounter: Payer: Self-pay | Admitting: Urology

## 2020-10-29 ENCOUNTER — Telehealth: Payer: Self-pay | Admitting: Urology

## 2020-10-29 NOTE — Telephone Encounter (Signed)
LMOM FOR PT. TO CALL ME BACK TO DISCUSS SURGERY DATE.

## 2020-10-29 NOTE — Telephone Encounter (Signed)
-----  Message from Abbie Sons, MD sent at 10/28/2020 10:01 PM EDT ----- Regarding: Surgery scheduling Surgical Physician Order Form  #Length of Case: 45 min  #Medical Clearance if needed:    PCP     []       Cardiology    []        Pulmonology []   #MD to give Clearance: n/a  #Anticoagulation: Hold all anticoagulation [No]  May continue aspirin [No-hold aspirin x3 days]          May continue all anticoagulants [No]  #Post-op visit Date/Instructions: 4 weeks        KUB prior []     RUS prior   []     Voiding trial  []     Cysto stent removal  []    Follow up with: MD   #Diagnosis: Bladder tumor <2 cm  #Procedure: TURBT <2 cm  Admit type: Outpatient  Yes     Anesthesia: Choice   Labs:  Per Anesthesia  [YES]    UA&Urine Culture [Yes]  CBC  []    B MET   []    PT/INR   []   aPTT  []            Urine Pregnancy []    Urine Drug Screen []     Type & Screen  []   Crossmatch: ____ units  Other: ________   Tests:  EKG/Chest x-ray per Anesthesia [YES]         Chest X-ray  []   EKG  []   KUB day of procedure  []    Medications:   Ancef 1g IV  []       Ancef 2g IV  [X]   Ciprofloxacin 400mg  IV  []   Vancomycin 1g IV  []   Ceftriaxone (Rocephin) 1g IV  []     Clindamycin 600mg  IV  []   Clindamycin 900mg  IV  []   Ampicillin 1g IV  []   Keflex 500mg  PO  []   Gentamicin __ mg IV or per pharmacy []   Gemcitabine 2000mg  bladder instillation []      Diflucan 100mg  IV  []    Botox injection []  units ____  Magnesium citrate 1 bottle PO - when?___     Fleets enema - when?__  Other:__________     VTE Prophylaxis:     SCD's  [X]       TED Hose  []     Heparin 5000 units sq  []         Other: __________

## 2020-10-30 ENCOUNTER — Other Ambulatory Visit: Payer: Self-pay

## 2020-10-30 ENCOUNTER — Encounter: Payer: Self-pay | Admitting: Urology

## 2020-10-30 ENCOUNTER — Telehealth: Payer: Self-pay | Admitting: Urology

## 2020-10-30 DIAGNOSIS — D494 Neoplasm of unspecified behavior of bladder: Secondary | ICD-10-CM

## 2020-10-30 NOTE — Telephone Encounter (Signed)
-----  Message from Abbie Sons, MD sent at 10/28/2020 10:01 PM EDT ----- Regarding: Surgery scheduling Surgical Physician Order Form  #Length of Case: 45 min  #Medical Clearance if needed:    PCP     []       Cardiology    []        Pulmonology []   #MD to give Clearance: n/a  #Anticoagulation: Hold all anticoagulation [No]  May continue aspirin [No-hold aspirin x3 days]          May continue all anticoagulants [No]  #Post-op visit Date/Instructions: 4 weeks        KUB prior []     RUS prior   []     Voiding trial  []     Cysto stent removal  []    Follow up with: MD   #Diagnosis: Bladder tumor <2 cm  #Procedure: TURBT <2 cm  Admit type: Outpatient  Yes     Anesthesia: Choice   Labs:  Per Anesthesia  [YES]    UA&Urine Culture [Yes]  CBC  []    B MET   []    PT/INR   []   aPTT  []            Urine Pregnancy []    Urine Drug Screen []     Type & Screen  []   Crossmatch: ____ units  Other: ________   Tests:  EKG/Chest x-ray per Anesthesia [YES]         Chest X-ray  []   EKG  []   KUB day of procedure  []    Medications:   Ancef 1g IV  []       Ancef 2g IV  [X]   Ciprofloxacin 400mg  IV  []   Vancomycin 1g IV  []   Ceftriaxone (Rocephin) 1g IV  []     Clindamycin 600mg  IV  []   Clindamycin 900mg  IV  []   Ampicillin 1g IV  []   Keflex 500mg  PO  []   Gentamicin __ mg IV or per pharmacy []   Gemcitabine 2000mg  bladder instillation []      Diflucan 100mg  IV  []    Botox injection []  units ____  Magnesium citrate 1 bottle PO - when?___     Fleets enema - when?__  Other:__________     VTE Prophylaxis:     SCD's  [X]       TED Hose  []     Heparin 5000 units sq  []         Other: __________

## 2020-10-30 NOTE — Telephone Encounter (Signed)
Per Dr. Bernardo Heater Patient is to be scheduled for TURBT  Mr. Krumholz was contacted and possible surgical dates were discussed, 11/13/20 was agreed upon for surgery. Patient was instructed that Dr. Bernardo Heater will require them to provide a pre-op UA & CX prior to surgery. This was ordered and scheduled drop off appointment was made for 11/01/20 @ 11:00am.    Patient was directed to call [phone] between 1-3pm the day before surgery to find out surgical arrival time.  Instructions were given not to eat or drink from midnight on the night before surgery and have a driver for the day of surgery. On the surgery day patient was instructed to enter through the [facility] entrance of [facility] report the Same Day Surgery desk.   Pre-Admit Testing will be in contact via phone to set up an interview with the anesthesia team to review your history and medications prior to surgery.   Reminder of this information was sent via [facility] to the patient.    Currently taking ASA 81 mg Dr.Stoioff requested pt. to stop 3 days prior to surgery, pt. aware and expressed understanding.

## 2020-10-30 NOTE — Telephone Encounter (Signed)
Orders placed. 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 Clearance initiated for Cardiology by Honor Loh, NP with Pre-Admit Testing

## 2020-10-30 NOTE — Progress Notes (Signed)
[facility] Posting Form   Surgery Date/Time: Date: 11/13/2020  Surgeon: Dr. John Giovanni  Surgery Location: Day Surgery  Inpt ( No  )   Outpt (Yes)   Obs ( No  )   Diagnosis: D49.4 Bladder Tumor  -CPT: [id number]   Surgery: TURBT <2cm  Stop Anticoagulations: Yes, Hold Aspirin x 3 days  Cardiac/Medical/Pulmonary Clearance needed: NO  *Orders entered into EPIC  Date: 10/30/20   *Case booked in EPIC  Date: 10/30/20  *Notified pt of Surgery: Date: 10/30/20  PRE-OP UA & CX: 11/01/2020  *Placed into Prior Authorization Work Fabio Bering Date: 10/30/20   Assistant/laser/rep:No

## 2020-11-01 ENCOUNTER — Other Ambulatory Visit: Payer: Self-pay

## 2020-11-01 ENCOUNTER — Other Ambulatory Visit: Payer: Medicare Other

## 2020-11-01 DIAGNOSIS — D494 Neoplasm of unspecified behavior of bladder: Secondary | ICD-10-CM

## 2020-11-02 ENCOUNTER — Encounter: Payer: Self-pay | Admitting: Urology

## 2020-11-02 LAB — URINALYSIS, COMPLETE
Bilirubin, UA: NEGATIVE
Glucose, UA: NEGATIVE
Ketones, UA: NEGATIVE
Leukocytes,UA: NEGATIVE
Nitrite, UA: NEGATIVE
Specific Gravity, UA: 1.025 (ref 1.005–1.030)
Urobilinogen, Ur: 0.2 mg/dL (ref 0.2–1.0)
pH, UA: 6 (ref 5.0–7.5)

## 2020-11-02 LAB — MICROSCOPIC EXAMINATION: RBC, Urine: >30 /HPF — AB (ref 0–2)

## 2020-11-02 NOTE — Telephone Encounter (Signed)
Pt calling asking for results for UA before he goes out of town. Please advise

## 2020-11-06 ENCOUNTER — Encounter: Payer: Self-pay | Admitting: Urology

## 2020-11-06 NOTE — Progress Notes (Signed)
Perioperative Services  Pre-Admission/Anesthesia Testing Clinical Review  Date: 11/09/20  Patient Demographics:  Name: [name] DOB:   Aug 23, 1945 MRN:   [id number]  Planned Surgical Procedure(s):    Case: [id number] Date/Time: 11/13/20 0854   Procedure: TRANSURETHRAL RESECTION OF BLADDER TUMOR (TURBT)   Anesthesia type: General   Pre-op diagnosis: BLADDER TUMOR   Location: [facility] OR ROOM 10 / [facility]S FOR ANESTHESIA GROUP   Surgeons: Abbie Sons, MD     NOTE: Available PAT nursing documentation and vital signs have been reviewed. Clinical nursing staff has updated patient's PMH/PSHx, current medication list, and drug allergies/intolerances to ensure comprehensive history available to assist in medical decision making as it pertains to the aforementioned surgical procedure and anticipated anesthetic course. Extensive review of available clinical information performed. [place] PMH and PSHx updated with any diagnoses/procedures that  may have been inadvertently omitted during his intake with the pre-admission testing department's nursing staff.  Clinical Discussion:  [name] is a 75 y.o. male who is submitted for pre-surgical anesthesia review and clearance prior to him undergoing the above procedure. Patient is a Former Smoker (45 pack years; quit 02/2006). Pertinent PMH includes: CAD (s/p CABG), RBBB, LAFB, HTN, HLD, bladder tumor.  Patient is followed by cardiology Saralyn Pilar, MD). He was last seen in the cardiology clinic on 09/24/2020; notes reviewed.  At the time of his clinic visit, patient doing well overall from a cardiovascular perspective.  He denied any episodes of chest pain, shortness breath, PND, orthopnea, palpitations, significant peripheral edema, vertiginous symptoms, or presyncope/syncope.  PMH significant for cardiovascular diagnoses.  Patient underwent diagnostic left heart catheterization on 04/13/2002 revealed an LVEF of 42%.  Study demonstrated  multivessel CAD; 100% proximal RCA, 50% distal RCA, 80% LM, 50% proximal LCx, 80% OM1, 90% mid LAD, 50% D1, and 90% D2.  Given degree and complexity of patient's disease, recommendations were for consultation with CVTS for CABG procedure.  Patient underwent a four-vessel CABG on 04/13/2002.  LIMA-LAD, SVG-OM1, SVG-D1, and SVG-PDA bypass grafts were placed.  Repeat diagnostic left heart catheterization was performed on 02/03/2013 revealing a normal LVEF of 50%.  Multivessel CAD once again demonstrated; 80% stenosis mid LM, 80% stenosis of the proximal LAD, 80% stenosis of OM1, and 100% stenosis proximal RCA.  Patient with patent LIMA-LAD and SVG-OM1 grafts.  SVG-PDA and SVG-DG 1 grafts were occluded with collaterals to PDA and DG 1 from the LAD.  Myocardial perfusion imaging study performed on 09/22/2018 revealed an LVEF of 45%.  There was inferior wall hypokinesis noted.  Left ventricular cavity size was enlarged.  There was evidence of a moderate inferior scar without evidence for ischemia.  Blood pressure reasonably controlled at 132/74 on currently prescribed diuretic, beta-blocker, and nitrate therapies.  Patient is on a statin for his HLD.  He is not diabetic. Functional capacity, as defined by DASI, is documented as being >/= 4 METS.  No changes were made to patient's medication regimen.  Patient to follow-up with outpatient cardiology in 6 months or sooner if needed.  [name] is scheduled for an TRANSURETHRAL RESECTION OF BLADDER TUMOR (TURBT) on 11/13/2020 with Dr. John Giovanni, MD.  Given patient's past medical history significant for cardiovascular diagnoses, presurgical cardiac clearance was sought by the PAT team. Per cardiology, "this patient is optimized for surgery and may proceed with the planned procedural course with a LOW risk of significant perioperative cardiovascular complications".  This patient is on daily antiplatelet therapy. He has been instructed on  recommendations for  holding his daily low-dose ASA for 5 days prior to his procedure with plans to restart as soon as postoperative bleeding risk felt to be minimized by his attending surgeon. The patient has been instructed that his last dose of his anticoagulant will be on 11/07/2020.  Patient denies previous perioperative complications with anesthesia in the past. In review of the available records, it is noted that patient underwent a MAC anesthetic course at [facility] (ASA III) in 01/2019 without documented complications.   Vitals with BMI 11/08/2020 10/17/2020 04/12/2020  Height _0  _1  _2   Weight 204 lbs 204 lbs 206 lbs  BMI 32.94 03.88 82.80  Systolic 034 917 915  Diastolic 72 70 74  Pulse 61 74 84    Providers/Specialists:   NOTE: Primary physician provider listed below. Patient may have been seen by APP or partner within same practice.   PROVIDER ROLE / SPECIALTY LAST Claud Kelp, MD Urology (Surgeon) 10/17/2020  Baxter Hire, MD Primary Care Provider 09/18/2020  Isaias Cowman, MD Cardiology 09/24/2020   Allergies:  Cidex [glutaral]  Current Home Medications:   No current facility-administered medications for this encounter.    Alpha Lipoic Acid 200 MG CAPS   aspirin 81 MG tablet   atorvastatin (LIPITOR) 10 MG tablet   b complex vitamins capsule   calcium & magnesium carbonates (MYLANTA) 056-979 MG tablet   cetirizine (ZYRTEC) 10 MG tablet   Cholecalciferol (VITAMIN D3 PO)   Coenzyme Q10 100 MG capsule   fluticasone (FLONASE) 50 MCG/ACT nasal spray   GLUCOSAMINE-CHONDROITIN PO   isosorbide mononitrate (IMDUR) 30 MG 24 hr tablet   Magnesium 200 MG TABS   metoprolol tartrate (LOPRESSOR) 25 MG tablet   metroNIDAZOLE (METROGEL) 0.75 % gel   Multiple Vitamin (MULTIVITAMIN) tablet   naproxen sodium (ANAPROX) 220 MG tablet   nitroGLYCERIN (NITROSTAT) 0.4 MG SL tablet   Omega-3 Fatty Acids (FISH OIL) 1000 MG CAPS   Potassium Gluconate 595 MG  CAPS   Probiotic Product (MEGA PROBIOTIC) CAPS   tamsulosin (FLOMAX) 0.4 MG CAPS capsule   triamterene-hydrochlorothiazide (MAXZIDE-25) 37.5-25 MG tablet   Vitamin A 2400 MCG (8000 UT) CAPS   vitamin B-12 (CYANOCOBALAMIN) 1000 MCG tablet   vitamin C (ASCORBIC ACID) 250 MG tablet   zinc gluconate 50 MG tablet   History:   Past Medical History:  Diagnosis Date   Bladder cancer (HCC)    Coronary artery disease    a.) LHC 04/12/2002 --> LVEF 42%; severe 3v CAD. 4v CABG on 04/13/2002. b.) LHC 02/03/2013 --> LVEF 50%; severe 3v CAD with patent LIMA-LAD and SVG-OM1 grafts; SVG-PDA and SVG-DG1 occluded with collaterals to PDA and DG1 from LAD.   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   a.) LIMA-LAD, SVG-OM1, SVG-D1, SVG-PDA   Tubular adenoma of colon 02/03/2007   Past Surgical History:  Procedure Laterality Date   CARDIAC CATHETERIZATION Left 04/13/2002   Procedure: CARDIAC CATHETERIZATION; Location: [facility]; Surgeon; Isaias Cowman, MD   CARDIAC CATHETERIZATION Left 02/03/2013   Procedure: CARDIAC CATHETERIZATION; Location: [facility]; Surgeon: Isaias Cowman, MD   COLONOSCOPY W/ POLYPECTOMY     COLONOSCOPY  WITH PROPOFOL N/A 04/09/2015   Procedure: COLONOSCOPY WITH PROPOFOL;  Surgeon: Hulen Luster, MD;  Location: [facility] ENDOSCOPY;  Service: Gastroenterology;  Laterality: N/A;   CORONARY ANGIOPLASTY     CORONARY ARTERY BYPASS GRAFT N/A 04/13/2002   Procedure: 4v CABG (LIMA-LAD, SVG-OM1, SVG-D1, SVG-PDA); Location: [facility]; Surgeon: Corinna Capra, MD   TONSILLECTOMY     TRANSURETHRAL RESECTION OF PROSTATE     Family History  Problem Relation Age of Onset   Prostate cancer Father        Metastatic   Cancer Father     Social History   Tobacco Use   Smoking status: Former    Packs/day: 0.75    Years: 60.00    Pack years: 45.00    Types: Cigarettes    Quit date: 2008    Years since quitting: 14.7   Smokeless tobacco: Never  Substance Use Topics   Alcohol use: Yes    Alcohol/week: 2.0 standard drinks    Types: 2 Standard drinks or equivalent per week   Drug use: No    Pertinent Clinical Results:  LABS: Labs reviewed: Acceptable for surgery.  Hospital Outpatient Visit on 11/09/2020  Component Date Value Ref Range Status   Sodium 11/09/2020 138  135 - 145 mmol/L Final   Potassium 11/09/2020 4.0  3.5 - 5.1 mmol/L Final   Chloride 11/09/2020 103  98 - 111 mmol/L Final   CO2 11/09/2020 25  22 - 32 mmol/L Final   Glucose, Bld 11/09/2020 139 (A) 70 - 99 mg/dL Final   Glucose reference range applies only to samples taken after fasting for at least 8 hours.   BUN 11/09/2020 17  8 - 23 mg/dL Final   Creatinine, Ser 11/09/2020 0.86  0.61 - 1.24 mg/dL Final   Calcium 11/09/2020 8.7 (A) 8.9 - 10.3 mg/dL Final   GFR, Estimated 11/09/2020 >60  >60 mL/min Final   Comment: (NOTE) Calculated using the CKD-EPI Creatinine Equation (2021)    Anion gap 11/09/2020 10  5 - 15 Final   Performed at [facility], [address] [place], [place] [address]  Admission on 11/08/2020, Discharged on 11/08/2020  Component Date Value Ref Range Status   SARS Coronavirus 2 11/08/2020 NEGATIVE  NEGATIVE Final   Comment: (NOTE) SARS-CoV-2 target nucleic acids are NOT DETECTED.  The SARS-CoV-2 RNA is generally detectable in upper and lower respiratory specimens during the acute phase of infection. Negative results do not preclude SARS-CoV-2 infection, do not rule out co-infections with other pathogens, and should not be used as the sole basis for treatment or other patient management decisions. Negative results must be combined with clinical observations, patient history, and epidemiological information. The  expected result is Negative.  Fact Sheet for Patients: SugarRoll.be  Fact Sheet for Healthcare Providers: https://www.woods-mathews.com/  This test is not yet approved or cleared by the [place] FDA and  has been authorized for detection and/or diagnosis of SARS-CoV-2 by FDA under an Emergency Use Authorization (EUA). This EUA will remain  in effect (meaning this test can be used) for the duration of the COVID-19 declaration under Se                          ction 564(b)(1) of the Act, 21 U.S.C. section 360bbb-3(b)(1), unless the authorization is terminated or revoked sooner.  Performed at [facility], [place] [address] [place], [place] [address]    ECG: Date: 11/09/2020 Time ECG obtained: 1219 PM Rate:  62 bpm Rhythm: normal sinus; RBBB; LAFB Axis (leads I and aVF): Normal Intervals: PR 174 ms. QRS 148 ms. QTc 468 ms. ST segment and T wave changes: No evidence of acute ST segment elevation or depression. Evidence of an age undetermined inferior infarct.  Comparison: Similar to previous tracing obtained on 01/27/2016  IMAGING / PROCEDURES: LEXISCAN performed on 09/22/2018 LVEF 45% Inferior wall hypokinesis Left ventricular cavity size enlarged No artifacts noted Moderate inferior scar without evidence of ischemia  LEFT HEART CATHETERIZATION AND CORONARY ANGIOGRAPHY performed on 02/03/2013 LVEF 50% Right dominant coronary circulation CAD 80% stenosis of the mid LM 80% stenosis of the proximal LAD 80% stenosis OM1 100% stenosis proximal RCA Distal RCA supplied by moderate collaterals from the distal LAD and proximal RCA Previously placed bypass grafts LIMA-LAD patent SVG-OM1 patent SVG-PDA occluded SVG-DG1 occluded Collaterals from PDA and DG1 from LAD  [place] performed on 04/13/2002 Four-vessel CABG procedure LIMA-LAD SVG-OM1 SVG-D1 SVG-PDA  LEFT HEART CATHETERIZATION AND CORONARY  ANGIOGRAPHY performed on 04/13/2002 LVEF 42% Multivessel CAD 100% stenosis proximal RCA 50% stenosis x 2 distal RCA 80% stenosis LM 50% stenosis proximal LCx 80% and 50% stenoses OM1 90 and 50% stenosis mid LAD 50% stenosis D1 100% stenosis D2 Recommendations: consult CVTS for consideration of CABG procedure  Impression and Plan:  [name] has been referred for pre-anesthesia review and clearance prior to him undergoing the planned anesthetic and procedural courses. Available labs, pertinent testing, and imaging results were personally reviewed by me. This patient has been appropriately cleared by cardiology with an overall LOW risk of significant perioperative cardiovascular complications.  Based on clinical review performed today (11/09/20), barring any significant acute changes in the patient's overall condition, it is anticipated that he will be able to proceed with the planned surgical intervention. Any acute changes in clinical condition may necessitate his procedure being postponed and/or cancelled. Patient will meet with anesthesia team (MD and/or CRNA) on the day of his procedure for preoperative evaluation/assessment. Questions regarding anesthetic course will be fielded at that time.   Pre-surgical instructions were reviewed with the patient during his PAT appointment and questions were fielded by PAT clinical staff. Patient was advised that if any questions or concerns arise prior to his procedure then he should return a call to PAT and/or his surgeon's office to discuss.  Honor Loh, MSN, APRN, FNP-C, CEN [facility]  Peri-operative Services Nurse Practitioner Phone: [phone] Fax: [phone] 11/09/20 2:18 PM  NOTE: This note has been prepared using Dragon dictation software. Despite my best ability to proofread, there is always the potential that unintentional transcriptional errors may still occur from this process.

## 2020-11-07 ENCOUNTER — Other Ambulatory Visit: Payer: Medicare Other

## 2020-11-08 ENCOUNTER — Ambulatory Visit
Admission: EM | Admit: 2020-11-08 | Discharge: 2020-11-08 | Disposition: A | Discharge status: Home / Self Care | Payer: Medicare Other | Attending: Emergency Medicine | Admitting: Emergency Medicine

## 2020-11-08 ENCOUNTER — Encounter: Payer: Self-pay | Admitting: Emergency Medicine

## 2020-11-08 ENCOUNTER — Other Ambulatory Visit: Payer: Self-pay

## 2020-11-08 ENCOUNTER — Encounter
Admission: RE | Admit: 2020-11-08 | Discharge: 2020-11-08 | Disposition: A | Discharge status: Home / Self Care | Payer: Medicare Other | Source: Ambulatory Visit | Attending: Urology | Admitting: Urology

## 2020-11-08 DIAGNOSIS — Z7982 Long term (current) use of aspirin: Secondary | ICD-10-CM | POA: Diagnosis not present

## 2020-11-08 DIAGNOSIS — Z20822 Contact with and (suspected) exposure to covid-19: Secondary | ICD-10-CM | POA: Insufficient documentation

## 2020-11-08 DIAGNOSIS — R490 Dysphonia: Secondary | ICD-10-CM

## 2020-11-08 DIAGNOSIS — Z87891 Personal history of nicotine dependence: Secondary | ICD-10-CM | POA: Insufficient documentation

## 2020-11-08 DIAGNOSIS — Z91048 Other nonmedicinal substance allergy status: Secondary | ICD-10-CM | POA: Diagnosis not present

## 2020-11-08 DIAGNOSIS — Z79899 Other long term (current) drug therapy: Secondary | ICD-10-CM | POA: Insufficient documentation

## 2020-11-08 HISTORY — DX: Malignant neoplasm of prostate: C61

## 2020-11-08 NOTE — ED Triage Notes (Signed)
Pt oxygen level is 92% on Room Air. He agrees to be seen by provider.

## 2020-11-08 NOTE — ED Triage Notes (Signed)
Pt is here to get Covid test only. He is having a TURP on Tuesday and was told to get Covid test.

## 2020-11-08 NOTE — ED Provider Notes (Signed)
[facility]    CSN: [id number] Arrival date & time: 11/08/20  1522      History   Chief Complaint Chief Complaint  Patient presents with  . Shortness of Breath    HPI [name] is a 75 y.o. male.   Patient presents with hoarseness present for 5 days.  Endorses that it has improved with time.  Relates symptoms to outdoor concert over the weekend where he had to loudly speak for a few hours.  Denies fever, chills, URI symptoms, difficulty swallowing, pain with swallowing.  Able to tolerate food and liquids.  Has upcoming TURP procedure this Tuesday, physicians requiring COVID testing prior to due to symptoms.  Nurse concern with O2 saturation at 92% on room air.  Patient denies shortness of breath, chest pain or tightness, wheezing.  Past Medical History:  Diagnosis Date  . Bladder cancer ([place])   . Coronary artery disease    a.) LHC 04/12/2002 --> LVEF 42%; severe 3v CAD. 4v CABG on 04/13/2002. b.) LHC 02/03/2013 --> LVEF 50%; severe 3v CAD with patent LIMA-LAD and SVG-OM1 grafts; SVG-PDA and SVG-DG1 occluded with collaterals to PDA and DG1 from LAD.  . ED (erectile dysfunction)   . Family history of macular degeneration 10/05/2018  . History of 2019 novel coronavirus disease (COVID-19) 03/29/2020  . HLD (hyperlipidemia)   . Hypertension   . Prostate cancer ([place])   . Rosacea   . 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 Tubular adenoma of colon 02/03/2007    Patient Active Problem List   Diagnosis Date Noted  . FH: macular degeneration 10/05/2018  . Elevated PSA 03/11/2018  . Family history of prostate cancer in father 02/24/2018  . Coronary artery disease involving native heart without angina pectoris 09/02/2017  . Personal history of malignant neoplasm of bladder 08/28/2017  . Presence of aortocoronary bypass graft 01/16/2014  . Rosacea 01/03/2014  . Cardiac disease 01/03/2014  . Pure hypercholesterolemia 01/03/2014  .  History of cardiac catheterization 01/03/2014  . Malignant neoplasm of bladder, unspecified ([place]) 10/04/2013  . Increased frequency of urination 02/07/2013  . ED (erectile dysfunction) of organic origin 11/26/2011  . Benign prostatic hyperplasia without lower urinary tract symptoms 11/26/2011  . Anaphylaxis 07/24/2011    Past Surgical History:  Procedure Laterality Date  . CARDIAC CATHETERIZATION Left 04/13/2002   Procedure: CARDIAC CATHETERIZATION; Location: [facility]; Surgeon; Isaias Cowman, MD  . CARDIAC CATHETERIZATION Left 02/03/2013   Procedure: CARDIAC CATHETERIZATION; Location: [facility]; Surgeon: Isaias Cowman, MD  . COLONOSCOPY W/ POLYPECTOMY    . COLONOSCOPY WITH PROPOFOL N/A 04/09/2015   Procedure: COLONOSCOPY WITH PROPOFOL;  Surgeon: Hulen Luster, MD;  Location: [facility] END[facility]COPY;  Service: Gastroenterology;  Laterality: N/A;  . CORONARY ANGIOPLASTY    . CORONARY ARTERY BYPASS GRAFT N/A 04/13/2002   Procedure: 4v CABG (LIMA-LAD, SVG-OM1, SVG-D1, SVG-PDA); Location: [facility]; Surgeon: Corinna Capra, [facility] [employer] [facility]    . TRANSURETHRAL RESECTION OF PROSTATE         Home Medications    Prior to Admission medications   Medication Sig Start Date End Date Taking? Authorizing Provider  Alpha Lipoic Acid 200 MG CAPS Take 200 mg by mouth daily.    [provider]  aspirin 81 MG tablet Take 81 mg by mouth daily.    [provider]  atorvastatin (LIPITOR) 10 MG tablet Take 10 mg by mouth daily.    [provider]  b complex vitamins capsule Take 1 capsule  by mouth daily.    [provider]  calcium & magnesium carbonates (MYLANTA) 119-147 MG tablet Take 1 tablet by mouth daily as needed for heartburn.    [provider]  cetirizine (ZYRTEC) 10 MG tablet Take 10 mg by mouth at bedtime.    [provider]  Cholecalciferol (VITAMIN D3 PO) Take 1,000 Units by mouth daily.    [provider]  Coenzyme Q10 100 MG capsule Take 100 mg  by mouth daily.     [provider]  fluticasone (FLONASE) 50 MCG/ACT nasal spray Place 1 spray into both nostrils at bedtime.    [provider]  GLUCOSAMINE-CHONDROITIN PO Take 1 tablet by mouth 2 (two) times daily.    [provider]  isosorbide mononitrate (IMDUR) 30 MG 24 hr tablet Take 30 mg by mouth every morning.    [provider]  Magnesium 200 MG TABS Take 200 mg by mouth 2 (two) times daily.    [provider]  metoprolol tartrate (LOPRESSOR) 25 MG tablet Take 25 mg by mouth 2 (two) times daily.    [provider]  metroNIDAZOLE (METROGEL) 0.75 % gel Apply 1 application topically 2 (two) times daily.    [provider]  Multiple Vitamin (MULTIVITAMIN) tablet Take 1 tablet by mouth daily.    [provider]  naproxen sodium (ANAPROX) 220 MG tablet Take 220 mg by mouth daily as needed (pain).    [provider]  nitroGLYCERIN (NITROSTAT) 0.4 MG SL tablet Place 0.4 mg under the tongue every 5 (five) minutes as needed for chest pain.    [provider]  Omega-3 Fatty Acids (FISH OIL) 1000 MG CAPS Take 1,000 mg by mouth daily.    [provider]  Potassium Gluconate 595 MG CAPS Take 1 capsule by mouth daily as needed (After HT TR exercise).    [provider]  Probiotic Product (MEGA PROBIOTIC) CAPS Take 1 capsule by mouth daily. 07/20/19   [provider]  tamsulosin (FLOMAX) 0.4 MG CAPS capsule Take 1 capsule (0.4 mg total) by mouth daily. 08/29/20   Stoioff, Ronda Fairly, MD  triamterene-hydrochlorothiazide (MAXZIDE-25) 37.5-25 MG tablet Take 1 tablet by mouth daily.    [provider]  Vitamin A 2400 MCG (8000 UT) CAPS Take 8,000 Units by mouth daily.    [provider]  vitamin B-12 (CYANOCOBALAMIN) 1000 MCG tablet Take 1,000 mcg by mouth daily.    [provider]  vitamin C (ASCORBIC ACID) 250 MG tablet Take 250 mg by mouth 2 (two) times daily.     [provider]  zinc gluconate 50 MG tablet Take 50 mg by mouth daily.    [provider]    Family History Family History  Problem Relation Age of Onset  . Prostate cancer Father        Metestatic  . Cancer Father     Social History Social History   Tobacco Use  . Smoking status: Former    Packs/day: 0.75    Years: 60.00    Pack years: 45.00    Types: Cigarettes    Quit date: 2008    Years since quitting: 14.7  . Smokeless tobacco: Never  Substance Use Topics  . Alcohol use: Yes    Alcohol/week: 2.0 standard drinks    Types: 2 Standard drinks or equivalent per week  . Drug use: No     Allergies   Cidex [glutaral]   Review of Systems Review of Systems  Constitutional: Negative.   HENT: Negative.    Respiratory: Negative.    Cardiovascular: Negative.   Skin: Negative.   Neurological: Negative.     Physical Exam Triage Vital Signs ED Triage Vitals  Enc Vitals Group     BP 11/08/20 1549 135/72     Pulse Rate 11/08/20 1549 61     Resp 11/08/20 1549 18     Temp 11/08/20 1549 98.3 F (36.8 C)     Temp src --      SpO2 11/08/20 1549 93 %     Weight --      Height --      Head Circumference --      Peak Flow --      Pain Score 11/08/20 1546 0     Pain Loc --      Pain Edu? --      Excl. in [place]? --    No data found.  Updated Vital Signs BP 135/72 (BP Location: Left Arm)   Pulse 61   Temp 98.3 F (36.8 C)   Resp 18   SpO2 93%   Visual Acuity Right Eye Distance:   Left Eye Distance:   Bilateral Distance:    Right Eye Near:   Left Eye Near:    Bilateral Near:     Physical Exam Constitutional:      Appearance: Normal appearance.  HENT:     Head: Normocephalic.     Right Ear: Tympanic membrane, ear canal and external ear normal.     Left Ear: Tympanic membrane, ear canal and external ear normal.     Nose: Nose normal.     Mouth/Throat:     Mouth: Mucous membranes are moist.     Pharynx: Oropharynx is clear.  Eyes:      Extraocular Movements: Extraocular movements intact.  Cardiovascular:     Rate and Rhythm: Normal rate and regular rhythm.     Pulses: Normal pulses.     Heart sounds: Normal heart sounds.  Pulmonary:     Effort: Pulmonary effort is normal.     Breath sounds: Normal breath sounds.  Skin:    General: Skin is warm and dry.  Neurological:     Mental Status: He is alert and oriented to person, place, and time. Mental status is at baseline.  Psychiatric:        Mood and Affect: Mood normal.        Behavior: Behavior normal.     UC Treatments / Results  Labs (all labs ordered are listed, but only abnormal results are displayed) Labs Reviewed  SARS CORONAVIRUS 2 (TAT 6-24 HRS)    EKG   Radiology No results found.  Procedures Procedures (including critical care time)  Medications Ordered in UC Medications - No data to display  Initial Impression / Assessment and Plan / UC Course  I have reviewed the triage vital signs and the nursing notes.  Pertinent labs & imaging results that were available during my care of the patient were reviewed by me and considered in my medical decision making (see chart for details).  Hoarseness  O2 saturation remaining approximately 93%, did not exceed 95% during examination, patient on room air which is his baseline, lungs clear, vital signs stable, discussed with patient that hoarseness could be related to concert or could possibly of viral etiology, COVID test is pending, to notify if positive, patient is stable at this time and we will defer chest x-ray, patient given incentive spirometer  to use twice a day to help with lung expansion, explained how to use device, verbalized understanding, patient has preop appointment tomorrow and will follow-up provider completing procedure Final Clinical Impressions(s) / UC Diagnoses   Final diagnoses:  Hoarseness     Discharge Instructions      Your oxygen level appears stable today stable and your  lungs are clear  He can complete 10 rounds of incentive spirometry twice a day until your procedure to help strengthen your lungs and to promote deep breathing  Your COVID test is pending, you will be contacted if positive, if positive your quarantine ends tomorrow 11/09/2016 and you may resume normal activities on Saturday   ED Prescriptions   None    PDMP not reviewed this encounter.   Hans Eden, NP 11/08/20 1729

## 2020-11-08 NOTE — Patient Instructions (Addendum)
Your procedure is scheduled ID:[id number] 11/13/20 Report to the Registration Desk on the 1st floor of the [facility]. To find out your arrival time, please call [phone] between 1PM - 3PM on: Monday 11/12/20  REMEMBER: Instructions that are not followed completely may result in serious medical risk, up to and including death; or upon the discretion of your surgeon and anesthesiologist your surgery may need to be rescheduled.  Do not eat food after midnight the night before surgery.  No gum chewing, lozengers or hard candies.   TAKE THESE MEDICATIONS THE MORNING OF SURGERY WITH A SIP OF WATER: Metoprolol Imdur Flomax nitroGLYCERIN (NITROSTAT) 0.4 MG SL tablet if needed  Take last dose of Aspirin on Friday 11/09/20 for preparation of surgery.  Take these medicines Monday 11/12/20:  atorvastatin (LIPITOR) 10 MG tablet cetirizine (ZYRTEC) 10 MG tablet fluticasone (FLONASE) 50 MCG/ACT nasal spray triamterene-hydrochlorothiazide (MAXZIDE-25) 37.5-25 MG tablet Mussionex  Stop taking these medicines Thursday 11/08/20 prior to surgery: Alpha Lipoic Acid 200 MG CAPS b complex vitamins capsule Cholecalciferol (VITAMIN D3 PO) Coenzyme Q10 100 MG capsule GLUCOSAMINE-CHONDROITIN PO Magnesium 200 MG TABS Multiple Vitamin (MULTIVITAMIN) tablet naproxen sodium (ANAPROX) 220 MG tablet Omega-3 Fatty Acids (FISH OIL) 1000 MG CAPS Potassium Gluconate 595 MG CAPS Probiotic Product (MEGA PROBIOTIC) CAPS Vitamin A 2400 MCG (8000 UT) CAPS vitamin B-12 (CYANOCOBALAMIN) 1000 MCG tablet vitamin C (ASCORBIC ACID) 250 MG tablet zinc gluconate 50 MG tablet   Stop taking Stop Anti-inflammatories (NSAIDS) such as Advil, Aleve, Ibuprofen, Motrin, Naproxen, Naprosyn and Aspirin based products such as Excedrin, Goodys Powder, BC Powder.  You may however, continue to take Tylenol if needed for pain up until the day of surgery.  No Alcohol for 24 hours before or after surgery.  On the morning of surgery  brush your teeth with toothpaste and water, you may rinse your mouth with mouthwash if you wish. Do not swallow any toothpaste or mouthwash.  Do not wear jewelry, make-up, hairpins, clips or nail polish.  Do not wear lotions, powders, or perfumes.   Do not shave body from the neck down 48 hours prior to surgery just in case you cut yourself which could leave a site for infection.  Also, freshly shaved skin may become irritated if using the CHG soap.  Do not bring valuables to the hospital. [facility] is not responsible for any missing/lost belongings or valuables.   Notify your doctor if there is any change in your medical condition (cold, fever, infection).  Wear comfortable clothing (specific to your surgery type) to the hospital.  After surgery, you can help prevent lung complications by doing breathing exercises.  Take deep breaths and cough every 1-2 hours.   If you are being discharged the day of surgery, you will not be allowed to drive home. You will need a responsible adult (18 years or older) to drive you home and stay with you that night.   If you are taking public transportation, you will need to have a responsible adult (18 years or older) with you. Please confirm with your physician that it is acceptable to use public transportation.   Please call the [place] [facility] at [phone] if you have any questions about these instructions.  Surgery Visitation Policy:  Patients undergoing a surgery or procedure may have one family member or support person with them as long as that person is not COVID-19 positive or experiencing its symptoms.  That person may remain in the waiting area during the procedure and  may rotate out with other people.  Inpatient Visitation:    Visiting hours are 7 a.m. to 8 p.m. Up to two visitors ages 16+ are allowed at one time in a patient room. The visitors may rotate out with other people during the day. Visitors must check out  when they leave, or other visitors will not be allowed. One designated support person may remain overnight. The visitor must pass COVID-19 screenings, use hand sanitizer when entering and exiting the patient's room and wear a mask at all times, including in the patient's room. Patients must also wear a mask when staff or their visitor are in the room. Masking is required regardless of vaccination status.

## 2020-11-08 NOTE — Discharge Instructions (Addendum)
Your oxygen level appears stable today stable and your lungs are clear  He can complete 10 rounds of incentive spirometry twice a day until your procedure to help strengthen your lungs and to promote deep breathing  Your COVID test is pending, you will be contacted if positive, if positive your quarantine ends tomorrow 11/09/2016 and you may resume normal activities on Saturday

## 2020-11-09 ENCOUNTER — Telehealth: Payer: Self-pay | Admitting: *Deleted

## 2020-11-09 ENCOUNTER — Encounter: Payer: Self-pay | Admitting: *Deleted

## 2020-11-09 ENCOUNTER — Encounter
Admission: RE | Admit: 2020-11-09 | Discharge: 2020-11-09 | Disposition: A | Discharge status: Home / Self Care | Payer: Medicare Other | Source: Ambulatory Visit | Attending: Urology | Admitting: Urology

## 2020-11-09 ENCOUNTER — Encounter: Payer: Self-pay | Admitting: Urology

## 2020-11-09 ENCOUNTER — Other Ambulatory Visit: Payer: Self-pay | Admitting: Urology

## 2020-11-09 DIAGNOSIS — Z01818 Encounter for other preprocedural examination: Secondary | ICD-10-CM | POA: Diagnosis not present

## 2020-11-09 LAB — BASIC METABOLIC PANEL
Anion gap: 10 (ref 5–15)
BUN: 17 mg/dL (ref 8–23)
CO2: 25 mmol/L (ref 22–32)
Calcium: 8.7 mg/dL — ABNORMAL LOW (ref 8.9–10.3)
Chloride: 103 mmol/L (ref 98–111)
Creatinine, Ser: 0.86 mg/dL (ref 0.61–1.24)
GFR, Estimated: >60 mL/min (ref >60)
Glucose, Bld: 139 mg/dL — ABNORMAL HIGH (ref 70–99)
Potassium: 4 mmol/L (ref 3.5–5.1)
Sodium: 138 mmol/L (ref 135–145)

## 2020-11-09 LAB — CULTURE, URINE COMPREHENSIVE

## 2020-11-09 LAB — SARS CORONAVIRUS 2 (TAT 6-24 HRS): SARS Coronavirus 2: NEGATIVE

## 2020-11-09 MED ORDER — CEFUROXIME AXETIL 250 MG PO TABS: 250.0000 mg | ORAL_TABLET | Freq: Two times a day (BID) | ORAL | 0 refills | Status: AC

## 2020-11-09 NOTE — Telephone Encounter (Signed)
-----   Message from Abbie Sons, MD sent at 11/09/2020  2:23 PM EDT ----- Urine culture growing a low level of bacteria.  I sent an antibiotic to [facility].  Please have him start taking this tomorrow at the latest

## 2020-11-09 NOTE — Telephone Encounter (Signed)
Pt called office and I read message from [place].

## 2020-11-13 ENCOUNTER — Ambulatory Visit: Payer: Medicare Other | Admitting: Urgent Care

## 2020-11-13 ENCOUNTER — Other Ambulatory Visit: Payer: Self-pay

## 2020-11-13 ENCOUNTER — Ambulatory Visit
Admission: RE | Admit: 2020-11-13 | Discharge: 2020-11-13 | Disposition: A | Discharge status: Home / Self Care | Payer: Medicare Other | Source: Ambulatory Visit | Attending: Urology | Admitting: Urology

## 2020-11-13 ENCOUNTER — Encounter: Payer: Self-pay | Admitting: Urology

## 2020-11-13 ENCOUNTER — Encounter
Admission: RE | Disposition: A | Discharge status: Home / Self Care | Payer: Self-pay | Source: Ambulatory Visit | Attending: Urology

## 2020-11-13 DIAGNOSIS — E785 Hyperlipidemia, unspecified: Secondary | ICD-10-CM | POA: Diagnosis not present

## 2020-11-13 DIAGNOSIS — D494 Neoplasm of unspecified behavior of bladder: Secondary | ICD-10-CM | POA: Diagnosis present

## 2020-11-13 DIAGNOSIS — C679 Malignant neoplasm of bladder, unspecified: Secondary | ICD-10-CM | POA: Insufficient documentation

## 2020-11-13 DIAGNOSIS — I2510 Atherosclerotic heart disease of native coronary artery without angina pectoris: Secondary | ICD-10-CM | POA: Insufficient documentation

## 2020-11-13 DIAGNOSIS — I10 Essential (primary) hypertension: Secondary | ICD-10-CM | POA: Diagnosis not present

## 2020-11-13 DIAGNOSIS — Z951 Presence of aortocoronary bypass graft: Secondary | ICD-10-CM | POA: Diagnosis not present

## 2020-11-13 DIAGNOSIS — C674 Malignant neoplasm of posterior wall of bladder: Secondary | ICD-10-CM | POA: Diagnosis not present

## 2020-11-13 HISTORY — DX: Left anterior fascicular block: I44.4

## 2020-11-13 HISTORY — DX: Unspecified right bundle-branch block: I45.10

## 2020-11-13 HISTORY — PX: TRANSURETHRAL RESECTION OF BLADDER TUMOR: SHX2575

## 2020-11-13 HISTORY — DX: Hyperlipidemia, unspecified: E78.5

## 2020-11-13 SURGERY — TURBT (TRANSURETHRAL RESECTION OF BLADDER TUMOR)
Anesthesia: General

## 2020-11-13 MED ORDER — PROPOFOL 10 MG/ML IV BOLUS
INTRAVENOUS | Status: DC | PRN
  Administered 2020-11-13: 20 mg via INTRAVENOUS
  Administered 2020-11-13: 100 mg via INTRAVENOUS

## 2020-11-13 MED ORDER — EPHEDRINE SULFATE 50 MG/ML IJ SOLN
INTRAMUSCULAR | Status: DC | PRN
  Administered 2020-11-13: 5 mg via INTRAVENOUS
  Administered 2020-11-13: 10 mg via INTRAVENOUS

## 2020-11-13 MED ORDER — OXYCODONE HCL 5 MG PO TABS: 5.0000 mg | ORAL_TABLET | Freq: Once | ORAL | Status: DC | PRN

## 2020-11-13 MED ORDER — BELLADONNA ALKALOIDS-OPIUM 16.2-60 MG RE SUPP
RECTAL | Status: AC
  Filled 2020-11-13: qty 1

## 2020-11-13 MED ORDER — ONDANSETRON HCL 4 MG/2ML IJ SOLN
INTRAMUSCULAR | Status: DC | PRN
  Administered 2020-11-13: 4 mg via INTRAVENOUS

## 2020-11-13 MED ORDER — FENTANYL CITRATE (PF) 100 MCG/2ML IJ SOLN
INTRAMUSCULAR | Status: DC | PRN
  Administered 2020-11-13: 50 ug via INTRAVENOUS
  Administered 2020-11-13 (×2): 25 ug via INTRAVENOUS

## 2020-11-13 MED ORDER — CHLORHEXIDINE GLUCONATE 0.12 % MT SOLN
OROMUCOSAL | Status: AC
  Administered 2020-11-13: 15 mL via OROMUCOSAL
  Filled 2020-11-13: qty 15

## 2020-11-13 MED ORDER — FAMOTIDINE 20 MG PO TABS: 20.0000 mg | ORAL_TABLET | Freq: Once | ORAL | Status: AC

## 2020-11-13 MED ORDER — ONDANSETRON HCL 4 MG/2ML IJ SOLN: 4.0000 mg | Freq: Once | INTRAMUSCULAR | Status: DC | PRN

## 2020-11-13 MED ORDER — ORAL CARE MOUTH RINSE
15.0000 mL | Freq: Once | OROMUCOSAL | Status: AC
Start: 2020-11-13 — End: 2020-11-13

## 2020-11-13 MED ORDER — LACTATED RINGERS IV SOLN: INTRAVENOUS | Status: DC

## 2020-11-13 MED ORDER — LIDOCAINE HCL (CARDIAC) PF 100 MG/5ML IV SOSY
PREFILLED_SYRINGE | INTRAVENOUS | Status: DC | PRN
  Administered 2020-11-13: 100 mg via INTRAVENOUS

## 2020-11-13 MED ORDER — CHLORHEXIDINE GLUCONATE 0.12 % MT SOLN: 15.0000 mL | Freq: Once | OROMUCOSAL | Status: AC

## 2020-11-13 MED ORDER — SEVOFLURANE IN SOLN
RESPIRATORY_TRACT | Status: AC
  Filled 2020-11-13: qty 250

## 2020-11-13 MED ORDER — FAMOTIDINE 20 MG PO TABS
ORAL_TABLET | ORAL | Status: AC
  Administered 2020-11-13: 20 mg via ORAL
  Filled 2020-11-13: qty 1

## 2020-11-13 MED ORDER — FENTANYL CITRATE (PF) 100 MCG/2ML IJ SOLN: 25.0000 ug | INTRAMUSCULAR | Status: DC | PRN

## 2020-11-13 MED ORDER — CEFAZOLIN SODIUM-DEXTROSE 2-4 GM/100ML-% IV SOLN
2.0000 g | INTRAVENOUS | Status: AC
  Administered 2020-11-13: 2 g via INTRAVENOUS

## 2020-11-13 MED ORDER — PROPOFOL 10 MG/ML IV BOLUS
INTRAVENOUS | Status: AC
  Filled 2020-11-13: qty 40

## 2020-11-13 MED ORDER — PHENYLEPHRINE HCL (PRESSORS) 10 MG/ML IV SOLN
INTRAVENOUS | Status: AC
  Filled 2020-11-13: qty 1

## 2020-11-13 MED ORDER — SODIUM CHLORIDE 0.9 % IR SOLN
Status: DC | PRN
  Administered 2020-11-13: 6000 mL

## 2020-11-13 MED ORDER — DEXAMETHASONE SODIUM PHOSPHATE 10 MG/ML IJ SOLN
INTRAMUSCULAR | Status: DC | PRN
  Administered 2020-11-13: 8 mg via INTRAVENOUS

## 2020-11-13 MED ORDER — ACETAMINOPHEN 10 MG/ML IV SOLN: 1000.0000 mg | Freq: Once | INTRAVENOUS | Status: DC | PRN

## 2020-11-13 MED ORDER — CEFAZOLIN SODIUM-DEXTROSE 2-4 GM/100ML-% IV SOLN
INTRAVENOUS | Status: AC
  Filled 2020-11-13: qty 100

## 2020-11-13 MED ORDER — MIDAZOLAM HCL 2 MG/2ML IJ SOLN
INTRAMUSCULAR | Status: DC | PRN
Start: 2020-11-13 — End: 2020-11-13
  Administered 2020-11-13: 1 mg via INTRAVENOUS

## 2020-11-13 MED ORDER — PHENYLEPHRINE HCL (PRESSORS) 10 MG/ML IV SOLN
INTRAVENOUS | Status: DC | PRN
  Administered 2020-11-13: 300 ug via INTRAVENOUS

## 2020-11-13 MED ORDER — FENTANYL CITRATE (PF) 100 MCG/2ML IJ SOLN
INTRAMUSCULAR | Status: AC
  Filled 2020-11-13: qty 2

## 2020-11-13 MED ORDER — STERILE WATER FOR IRRIGATION IR SOLN
Status: DC | PRN
  Administered 2020-11-13: 3000 mL

## 2020-11-13 MED ORDER — GLYCOPYRROLATE 0.2 MG/ML IJ SOLN
INTRAMUSCULAR | Status: DC | PRN
  Administered 2020-11-13: .2 mg via INTRAVENOUS

## 2020-11-13 MED ORDER — OXYCODONE HCL 5 MG/5ML PO SOLN: 5.0000 mg | Freq: Once | ORAL | Status: DC | PRN

## 2020-11-13 MED ORDER — MIDAZOLAM HCL 2 MG/2ML IJ SOLN
INTRAMUSCULAR | Status: AC
  Filled 2020-11-13: qty 2

## 2020-11-13 MED ORDER — BELLADONNA ALKALOIDS-OPIUM 16.2-60 MG RE SUPP
RECTAL | Status: DC | PRN
  Administered 2020-11-13: 1 via RECTAL

## 2020-11-13 SURGICAL SUPPLY — 28 items
BAG DRAIN CYSTO-URO LG1000N (MISCELLANEOUS) ×2 IMPLANT
BAG DRN RND TRDRP ANRFLXCHMBR (UROLOGICAL SUPPLIES) ×1
BAG URINE DRAIN 2000ML AR STRL (UROLOGICAL SUPPLIES) ×2 IMPLANT
CATH FOLEY 2WAY 18X30 (CATHETERS) IMPLANT
CATH FOLEY 2WAY SIL 18X30 (CATHETERS)
DRAPE UTILITY 15X26 TOWEL STRL (DRAPES) ×2 IMPLANT
DRSG TELFA 4X3 1S NADH ST (GAUZE/BANDAGES/DRESSINGS) ×2 IMPLANT
ELECT LOOP 22F BIPOLAR SML (ELECTROSURGICAL)
ELECT REM PT RETURN 9FT ADLT (ELECTROSURGICAL) ×2
ELECTRODE LOOP 22F BIPOLAR SML (ELECTROSURGICAL) IMPLANT
ELECTRODE REM PT RTRN 9FT ADLT (ELECTROSURGICAL) ×1 IMPLANT
GAUZE 4X4 16PLY ~~LOC~~+RFID DBL (SPONGE) ×2 IMPLANT
GLOVE SURG UNDER POLY LF SZ7.5 (GLOVE) ×2 IMPLANT
GOWN STRL REUS W/ TWL LRG LVL3 (GOWN DISPOSABLE) ×1 IMPLANT
GOWN STRL REUS W/TWL LRG LVL3 (GOWN DISPOSABLE) ×2
GOWN STRL REUS W/TWL XL LVL4 (GOWN DISPOSABLE) ×2 IMPLANT
IV NS IRRIG 3000ML ARTHROMATIC (IV SOLUTION) ×4 IMPLANT
KIT TURNOVER CYSTO (KITS) ×2 IMPLANT
LOOP CUT BIPOLAR 24F LRG (ELECTROSURGICAL) ×2 IMPLANT
MANIFOLD NEPTUNE II (INSTRUMENTS) IMPLANT
PACK CYSTO AR (MISCELLANEOUS) ×2 IMPLANT
SET IRRIG Y TYPE TUR BLADDER L (SET/KITS/TRAYS/PACK) ×2 IMPLANT
SURGILUBE 2OZ TUBE FLIPTOP (MISCELLANEOUS) ×2 IMPLANT
SYR TOOMEY IRRIG 70ML (MISCELLANEOUS) ×2
SYRINGE TOOMEY IRRIG 70ML (MISCELLANEOUS) ×1 IMPLANT
WATER STERILE IRR 1000ML POUR (IV SOLUTION) ×2 IMPLANT
WATER STERILE IRR 3000ML UROMA (IV SOLUTION) ×2 IMPLANT
WATER STERILE IRR 500ML POUR (IV SOLUTION) ×2 IMPLANT

## 2020-11-13 NOTE — Discharge Instructions (Addendum)

## 2020-11-13 NOTE — Anesthesia Preprocedure Evaluation (Addendum)
Anesthesia Evaluation  Patient identified by MRN, date of birth, ID band Patient awake    Reviewed: Allergy & Precautions, NPO status , Patient's Chart, lab work & pertinent test results  History of Anesthesia Complications Negative for: history of anesthetic complications  Airway Mallampati: III  TM Distance: <3 FB Neck ROM: Full    Dental  (+) Teeth Intact   Pulmonary neg pulmonary ROS, neg sleep apnea, neg COPD, Patient abstained from smoking.Not current smoker, former smoker,    Pulmonary exam normal breath sounds clear to auscultation       Cardiovascular Exercise Tolerance: Good METShypertension, + CAD and + CABG  (-) Past MI + dysrhythmias  Rhythm:Regular Rate:Normal - Systolic murmurs ? Patient underwent diagnostic left heart catheterization on 04/13/2002 revealed an LVEF of 42%.  Study demonstrated multivessel CAD; 100% proximal RCA, 50% distal RCA, 80% LM, 50% proximal LCx, 80% OM1, 90% mid LAD, 50% D1, and 90% D2.  Given degree and complexity of patient's disease, recommendations were for consultation with CVTS for CABG procedure.  ? Patient underwent a four-vessel CABG on 04/13/2002.  LIMA-LAD, SVG-OM1, SVG-D1, and SVG-PDA bypass grafts were placed.  ? Repeat diagnostic left heart catheterization was performed on 02/03/2013 revealing a normal LVEF of 50%.  Multivessel CAD once again demonstrated; 80% stenosis mid LM, 80% stenosis of the proximal LAD, 80% stenosis of OM1, and 100% stenosis proximal RCA.  Patient with patent LIMA-LAD and SVG-OM1 grafts.  SVG-PDA and SVG-DG 1 grafts were occluded with collaterals to PDA and DG 1 from the LAD.  ? Myocardial perfusion imaging study performed on 09/22/2018 revealed an LVEF of 45%.  There was inferior wall hypokinesis noted.  Left ventricular cavity size was enlarged.  There was evidence of a moderate inferior scar without evidence for ischemia.   Neuro/Psych negative  neurological ROS  negative psych ROS   GI/Hepatic neg GERD  ,(+)     (-) substance abuse  ,   Endo/Other  neg diabetes  Renal/GU negative Renal ROS     Musculoskeletal   Abdominal   Peds  Hematology   Anesthesia Other Findings Past Medical History: No date: Bladder cancer ([place]) No date: Coronary artery disease     Comment:  a.) LHC 04/12/2002 --> LVEF 42%; severe 3v CAD. 4v CABG               on 04/13/2002. b.) LHC 02/03/2013 --> LVEF 50%; severe 3v              CAD with patent LIMA-LAD and SVG-OM1 grafts; SVG-PDA and               SVG-DG1 occluded with collaterals to PDA and DG1 from               LAD. No date: ED (erectile dysfunction) 10/05/2018: Family history of macular degeneration 03/29/2020: History of 2019 novel coronavirus disease (COVID-19) No date: HLD (hyperlipidemia) No date: Hypertension No date: LAFB (left anterior fascicular block) No date: Prostate cancer ([place]) No date: RBBB (right bundle branch block) No date: Rosacea 04/13/2002: S/P CABG x 4     Comment:  a.) LIMA-LAD, SVG-OM1, SVG-D1, SVG-PDA 02/03/2007: Tubular adenoma of colon  Reproductive/Obstetrics  Anesthesia Evaluation  Patient identified by MRN, date of birth, ID band Patient awake    Reviewed: Allergy & Precautions, H&P , NPO status , Patient's Chart, lab work & pertinent test results, reviewed documented beta blocker date and time   History of Anesthesia Complications Negative for: history of anesthetic complications  Airway Mallampati: III  TM Distance: >3 FB Neck ROM: full    Dental no notable dental hx. (+) Caps, Poor Dentition   Pulmonary neg pulmonary ROS, former smoker,    Pulmonary exam normal breath sounds clear to auscultation       Cardiovascular Exercise Tolerance: Good hypertension, (-) angina+ CAD and + CABG (4 vessel in 2004)  (-) Past MI and (-) Cardiac Stents  Normal cardiovascular exam(-) dysrhythmias (-) Valvular Problems/Murmurs Rhythm:regular Rate:Normal     Neuro/Psych negative neurological ROS  negative psych ROS   GI/Hepatic negative GI ROS, Neg liver ROS,   Endo/Other  negative endocrine ROS  Renal/GU negative Renal ROS  negative genitourinary   Musculoskeletal   Abdominal   Peds  Hematology negative hematology ROS (+)   Anesthesia Other Findings Past Medical History:   Bladder cancer [facility])                                         ED (erectile dysfunction)                                    Hypertension                                                 Rosacea                                                      Coronary artery disease                                      Reproductive/Obstetrics negative OB ROS                             Anesthesia Physical Anesthesia Plan  ASA: III  Anesthesia Plan: General   Post-op Pain Management:    Induction:   Airway Management Planned:   Additional Equipment:   Intra-op Plan:   Post-operative Plan:   Informed Consent: I have reviewed the patients History and Physical, chart, labs and discussed the procedure including the risks, benefits and alternatives for the proposed anesthesia with the patient or authorized representative who has indicated his/her understanding and acceptance.   Dental Advisory Given  Plan Discussed with: Anesthesiologist, CRNA and Surgeon  Anesthesia Plan Comments:         Anesthesia Quick Evaluation  Anesthesia Physical Anesthesia Plan  ASA: 3  Anesthesia Plan: General   Post-op Pain Management:    Induction: Intravenous  PONV Risk Score and Plan: 3 and Ondansetron, Dexamethasone and Treatment may vary due to age or medical condition  Airway Management Planned: LMA  Additional Equipment: None  Intra-op Plan:   Post-operative Plan: Extubation in OR  Informed Consent: I have reviewed the  patients History and Physical, chart, labs and discussed the procedure including the risks, benefits and alternatives for the proposed anesthesia with the patient or authorized representative who has indicated his/her understanding and acceptance.     Dental advisory given  Plan Discussed with: CRNA and Surgeon  Anesthesia Plan Comments: (Discussed risks of anesthesia with patient, including PONV, sore throat, lip/dental damage. Rare risks discussed as well, such as cardiorespiratory and neurological sequelae, and allergic reactions. Patient understands.)        Anesthesia Quick Evaluation

## 2020-11-13 NOTE — Interval H&P Note (Signed)
History and Physical Interval Note: CV: RRR Lungs: Clear  11/13/2020 8:53 AM  [name]  has presented today for surgery, with the diagnosis of BLADDER TUMOR.  The various methods of treatment have been discussed with the patient and family. After consideration of risks, benefits and other options for treatment, the patient has consented to  Procedure(s): TRANSURETHRAL RESECTION OF BLADDER TUMOR (TURBT) (N/A) as a surgical intervention.  The patient's history has been reviewed, patient examined, no change in status, stable for surgery.  I have reviewed the patient's chart and labs.  Questions were answered to the patient's satisfaction.     [place]

## 2020-11-13 NOTE — Anesthesia Procedure Notes (Signed)
Procedure Name: LMA Insertion Date/Time: 11/13/2020 9:11 AM Performed by: Gayland Curry, CRNA Pre-anesthesia Checklist: Patient identified, Emergency Drugs available, Suction available and Patient being monitored Oxygen Delivery Method: Circle system utilized Preoxygenation: Pre-oxygenation with 100% oxygen Induction Type: IV induction LMA: LMA inserted LMA Size: 5.0 Number of attempts: 1 Placement Confirmation: positive ETCO2 and breath sounds checked- equal and bilateral Tube secured with: Tape Dental Injury: Teeth and Oropharynx as per pre-operative assessment

## 2020-11-13 NOTE — Anesthesia Postprocedure Evaluation (Signed)
Anesthesia Post Note  Patient: [name]  Procedure(s) Performed: TRANSURETHRAL RESECTION OF BLADDER TUMOR (TURBT)  Patient location during evaluation: PACU Anesthesia Type: General Level of consciousness: awake and alert Pain management: pain level controlled Vital Signs Assessment: post-procedure vital signs reviewed and stable Respiratory status: spontaneous breathing, nonlabored ventilation, respiratory function stable and patient connected to nasal cannula oxygen Cardiovascular status: blood pressure returned to baseline and stable Postop Assessment: no apparent nausea or vomiting Anesthetic complications: no   No notable events documented.   Last Vitals:  Vitals:   11/13/20 1030 11/13/20 1057  BP: 107/63 126/78  Pulse: 71 69  Resp: 14 16  Temp:  (!) 36.1 C  SpO2: 93% 97%    Last Pain:  Vitals:   11/13/20 1057  TempSrc: Oral  PainSc: 2                  Arita Miss

## 2020-11-13 NOTE — Op Note (Signed)
Preoperative diagnosis:  Bladder tumor  Postoperative diagnosis:  Bladder tumor  Procedure: Cystoscopy with bladder biopsy/fulguration   Surgeon: Abbie Sons, MD  Anesthesia: General  Complications: None  Intraoperative findings:  ~ 1 cm papillary tumor upper right posterior wall with scattered satellite lesions  EBL: Minimal  Specimens: Bladder biopsies  Indication: [name] is a 75 y.o. 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  Last recurrence was in 2017 however on recent surveillance cystoscopy he was found to have a posterior wall papillary tumor.  After reviewing the management options for treatment, he elected to proceed with the above surgical procedure(s). We have discussed the potential benefits and risks of the procedure, side effects of the proposed treatment, the likelihood of the patient achieving the goals of the procedure, and any potential problems that might occur during the procedure or recuperation. Informed consent has been obtained.  Description of procedure:  The patient was taken to the operating room and general anesthesia was induced.  The patient was placed in the dorsal lithotomy position, prepped and draped in the usual sterile fashion, and preoperative antibiotics were administered. A preoperative time-out was performed.   A 24 French continuous-flow resectoscope sheath with visual obturator was lubricated and passed per urethra.  Urethra normal in caliber without stricture.  Moderate lateral lobe enlargement was noted with marked bladder neck elevation with some difficulty advancing the resectoscope into the bladder.  Due to the bladder neck elevation and posterior location of the tumor was unable to advance the resectoscope to allow for resection of the tumor.  The resectoscope was removed and a 21 French cystoscope was passed per urethra.  Using manual abdominal pressure the tumor was able to be  visualized and multiple cold biopsies were performed.  The biopsy site and surrounding mucosa was then fulgurated with a Bugbee electrode.  The satellite lesions were also fulgurated with the Bugbee electrode.  There were some areas of radiation change in the region of the bladder neck with hypervascularity which were also fulgurated.  At the completion of the procedure no visible tumor was noted.  Hemostasis was noted to be adequate.  The resectoscope was removed.  A Foley catheter was not placed.  Plan: Will contact patient with pathology report and further recommendations at that time    Abbie Sons, M.D.

## 2020-11-13 NOTE — Transfer of Care (Signed)
Immediate Anesthesia Transfer of Care Note  Patient: [name]  Procedure(s) Performed: TRANSURETHRAL RESECTION OF BLADDER TUMOR (TURBT)  Patient Location: PACU  Anesthesia Type:General  Level of Consciousness: awake, alert  and patient cooperative  Airway & Oxygen Therapy: Patient Spontanous Breathing and Patient connected to face mask oxygen  Post-op Assessment: Report given to RN and Post -op Vital signs reviewed and stable  Post vital signs: Reviewed and stable  Last Vitals:  Vitals Value Taken Time  BP 114/91 11/13/20 1000  Temp    Pulse 82 11/13/20 1001  Resp 20 11/13/20 1001  SpO2 99 % 11/13/20 1001  Vitals shown include unvalidated device data.  Last Pain:  Vitals:   11/13/20 0805  TempSrc: Tympanic  PainSc: 0-No pain         Complications: No notable events documented.

## 2020-11-14 ENCOUNTER — Telehealth: Payer: Self-pay | Admitting: Urology

## 2020-11-14 LAB — SURGICAL PATHOLOGY

## 2020-11-14 NOTE — Telephone Encounter (Signed)
Patient called in having very frequent urination and he would like a call back today,as he is not happy about having to wait. He would like to know what he can take over the counter to slow down his trips to the bathroom. His callback number is [phone]. Again, he would like a call back TODAY, as he is not happy about having to wait for someone to call him back.

## 2020-11-14 NOTE — Telephone Encounter (Signed)
Called patient back and let him know it is normal to have frequent after surgery . He will call back with any other questions.

## 2020-11-16 ENCOUNTER — Telehealth: Payer: Self-pay | Admitting: Urology

## 2020-11-16 ENCOUNTER — Encounter: Payer: Self-pay | Admitting: Urology

## 2020-11-16 DIAGNOSIS — C679 Malignant neoplasm of bladder, unspecified: Secondary | ICD-10-CM

## 2020-11-16 NOTE — Telephone Encounter (Signed)
I contacted [name] to discuss his bladder pathology results.  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  There was a focus of tumor which was suggestive of high-grade urothelial carcinoma.  Muscle was present and uninvolved.  Based on his pathology report I would recommend a 6-week course of intravesical chemotherapy.  Since this was predominantly low-grade would recommend intravesical gemcitabine.  We discussed due to guidelines this would have to be performed in the oncology clinic and a referral was placed.  Will schedule cystoscopy 8 weeks after he completes his 6-week course.

## 2020-11-21 ENCOUNTER — Inpatient Hospital Stay: Payer: Medicare Other

## 2020-11-21 ENCOUNTER — Inpatient Hospital Stay: Payer: Medicare Other | Attending: Oncology | Admitting: Oncology

## 2020-11-21 VITALS — BP 101/62 | HR 70 | Temp 98.0°F | Resp 20 | Wt 206.6 lb

## 2020-11-21 DIAGNOSIS — C674 Malignant neoplasm of posterior wall of bladder: Secondary | ICD-10-CM | POA: Diagnosis present

## 2020-11-21 DIAGNOSIS — Z87891 Personal history of nicotine dependence: Secondary | ICD-10-CM | POA: Insufficient documentation

## 2020-11-21 DIAGNOSIS — C61 Malignant neoplasm of prostate: Secondary | ICD-10-CM | POA: Diagnosis not present

## 2020-11-21 DIAGNOSIS — R350 Frequency of micturition: Secondary | ICD-10-CM | POA: Diagnosis not present

## 2020-11-21 DIAGNOSIS — Z8042 Family history of malignant neoplasm of prostate: Secondary | ICD-10-CM | POA: Insufficient documentation

## 2020-11-22 ENCOUNTER — Encounter: Payer: Self-pay | Admitting: Urology

## 2020-11-22 ENCOUNTER — Encounter: Payer: Self-pay | Admitting: Oncology

## 2020-11-23 ENCOUNTER — Other Ambulatory Visit: Payer: Self-pay | Admitting: *Deleted

## 2020-11-23 ENCOUNTER — Encounter: Payer: Self-pay | Admitting: Urology

## 2020-11-23 DIAGNOSIS — C61 Malignant neoplasm of prostate: Secondary | ICD-10-CM

## 2020-11-23 DIAGNOSIS — C689 Malignant neoplasm of urinary organ, unspecified: Secondary | ICD-10-CM

## 2020-11-23 NOTE — Progress Notes (Unsigned)
Orders

## 2020-11-24 ENCOUNTER — Other Ambulatory Visit: Payer: Self-pay | Admitting: Urology

## 2020-11-26 ENCOUNTER — Inpatient Hospital Stay: Payer: Medicare Other

## 2020-11-26 ENCOUNTER — Other Ambulatory Visit: Payer: Self-pay

## 2020-11-26 VITALS — BP 127/84 | HR 65 | Temp 96.2°F | Resp 18

## 2020-11-26 DIAGNOSIS — C679 Malignant neoplasm of bladder, unspecified: Secondary | ICD-10-CM

## 2020-11-26 DIAGNOSIS — C674 Malignant neoplasm of posterior wall of bladder: Secondary | ICD-10-CM | POA: Diagnosis not present

## 2020-11-26 DIAGNOSIS — C689 Malignant neoplasm of urinary organ, unspecified: Secondary | ICD-10-CM

## 2020-11-26 DIAGNOSIS — C61 Malignant neoplasm of prostate: Secondary | ICD-10-CM

## 2020-11-26 LAB — BASIC METABOLIC PANEL
Anion gap: 6 (ref 5–15)
BUN: 25 mg/dL — ABNORMAL HIGH (ref 8–23)
CO2: 30 mmol/L (ref 22–32)
Calcium: 9 mg/dL (ref 8.9–10.3)
Chloride: 101 mmol/L (ref 98–111)
Creatinine, Ser: 1.2 mg/dL (ref 0.61–1.24)
GFR, Estimated: >60 mL/min (ref >60)
Glucose, Bld: 115 mg/dL — ABNORMAL HIGH (ref 70–99)
Potassium: 4.2 mmol/L (ref 3.5–5.1)
Sodium: 137 mmol/L (ref 135–145)

## 2020-11-26 LAB — URINALYSIS, COMPLETE (UACMP) WITH MICROSCOPIC
Bacteria, UA: NONE SEEN
Bilirubin Urine: NEGATIVE
Glucose, UA: NEGATIVE mg/dL
Ketones, ur: NEGATIVE mg/dL
Nitrite: NEGATIVE
Protein, ur: 100 mg/dL — AB
RBC / HPF: >50 RBC/hpf — ABNORMAL HIGH (ref 0–5)
Specific Gravity, Urine: 1.019 (ref 1.005–1.030)
pH: 6 (ref 5.0–8.0)

## 2020-11-26 LAB — CBC WITH DIFFERENTIAL/PLATELET
Abs Immature Granulocytes: 0.07 10*3/uL (ref 0.00–0.07)
Basophils Absolute: 0.1 10*3/uL (ref 0.0–0.1)
Basophils Relative: 1 %
Eosinophils Absolute: 0.5 10*3/uL (ref 0.0–0.5)
Eosinophils Relative: 4 %
HCT: 49.4 % (ref 39.0–52.0)
Hemoglobin: 16.7 g/dL (ref 13.0–17.0)
Immature Granulocytes: 1 %
Lymphocytes Relative: 9 %
Lymphs Abs: 1.2 10*3/uL (ref 0.7–4.0)
MCH: 33.9 pg (ref 26.0–34.0)
MCHC: 33.8 g/dL (ref 30.0–36.0)
MCV: 100.2 fL — ABNORMAL HIGH (ref 80.0–100.0)
Monocytes Absolute: 1 10*3/uL (ref 0.1–1.0)
Monocytes Relative: 8 %
Neutro Abs: 9.9 10*3/uL — ABNORMAL HIGH (ref 1.7–7.7)
Neutrophils Relative %: 77 %
Platelets: 221 10*3/uL (ref 150–400)
RBC: 4.93 MIL/uL (ref 4.22–5.81)
RDW: 12.6 % (ref 11.5–15.5)
WBC: 12.8 10*3/uL — ABNORMAL HIGH (ref 4.0–10.5)
nRBC: 0 % (ref 0.0–0.2)

## 2020-11-26 MED ORDER — GEMCITABINE CHEMO FOR BLADDER INSTILLATION 2000 MG
2000.0000 mg | Freq: Once | INTRAVENOUS | Status: AC
  Administered 2020-11-26: 2000 mg via INTRAVESICAL
  Filled 2020-11-26: qty 52.6

## 2020-11-26 MED ORDER — PROCHLORPERAZINE MALEATE 10 MG PO TABS
10.0000 mg | ORAL_TABLET | Freq: Once | ORAL | Status: AC
  Administered 2020-11-26: 10 mg via ORAL
  Filled 2020-11-26: qty 1

## 2020-11-26 MED ORDER — OXYBUTYNIN CHLORIDE 5 MG PO TABS
5.0000 mg | ORAL_TABLET | Freq: Once | ORAL | Status: AC | PRN
  Administered 2020-11-26: 5 mg via ORAL
  Filled 2020-11-26: qty 1

## 2020-11-26 NOTE — Patient Instructions (Addendum)
[facility] ONCOLOGY  Discharge Instructions: Thank you for choosing [facility] to provide your oncology and hematology care.  If you have a lab appointment with the [facility], please go directly to the [facility] and check in at the registration area.  Wear comfortable clothing and clothing appropriate for easy access to any Portacath or PICC line.   We strive to give you quality time with your provider. You may need to reschedule your appointment if you arrive late (15 or more minutes).  Arriving late affects you and other patients whose appointments are after yours.  Also, if you miss three or more appointments without notifying the office, you may be dismissed from the clinic at the provider's discretion.      For prescription refill requests, have your pharmacy contact our office and allow 72 hours for refills to be completed.    Today you received the following chemotherapy and/or immunotherapy agents : Intravesical Gemzar   To help prevent nausea and vomiting after your treatment, we encourage you to take your nausea medication as directed.  BELOW ARE SYMPTOMS THAT SHOULD BE REPORTED IMMEDIATELY: *FEVER GREATER THAN 100.4 F (38 C) OR HIGHER *CHILLS OR SWEATING *NAUSEA AND VOMITING THAT IS NOT CONTROLLED WITH YOUR NAUSEA MEDICATION *UNUSUAL SHORTNESS OF BREATH *UNUSUAL BRUISING OR BLEEDING *URINARY PROBLEMS (pain or burning when urinating, or frequent urination) *BOWEL PROBLEMS (unusual diarrhea, constipation, pain near the anus) TENDERNESS IN MOUTH AND THROAT WITH OR WITHOUT PRESENCE OF ULCERS (sore throat, sores in mouth, or a toothache) UNUSUAL RASH, SWELLING OR PAIN  UNUSUAL VAGINAL DISCHARGE OR ITCHING   Items with * indicate a potential emergency and should be followed up as soon as possible or go to the Emergency Department if any problems should occur.  Please show the CHEMOTHERAPY ALERT CARD or IMMUNOTHERAPY ALERT CARD at  check-in to the Emergency Department and triage nurse.  Should you have questions after your visit or need to cancel or reschedule your appointment, please contact [facility]  [phone] and follow the prompts.  Office hours are 8:00 a.m. to 4:30 p.m. Monday - Friday. Please note that voicemails left after 4:00 p.m. may not be returned until the following business day.  We are closed weekends and major holidays. You have access to a nurse at all times for urgent questions. Please call the main number to the clinic [phone] and follow the prompts.  For any non-urgent questions, you may also contact your provider using MyChart. We now offer e-Visits for anyone 60 and older to request care online for non-urgent symptoms. For details visit [email or web address].   Also download the MyChart app! Go to the app store, search "MyChart", open the app, select [facility], and log in with your MyChart username and password.  Due to Covid, a mask is required upon entering the hospital/clinic. If you do not have a mask, one will be given to you upon arrival. For doctor visits, patients may have 1 support person aged 9 or older with them. For treatment visits, patients cannot have anyone with them due to current Covid guidelines and our immunocompromised population.

## 2020-11-26 NOTE — Progress Notes (Signed)
Pt had TURBT on 9/27. Per Dr Janese Banks & Dr Bernardo Heater- ok to receive intravesical gemzar today. MD reviewed labs/ urinalysis from today- ok to treat. Foley catheter inserted by Haywood Pao RN. Clear yellow urine drained into bag. Used clamp on foley catheter. Slowly instilled Gemzar. Once finished instilling Gemzar- pt immediately complained of spasms. Pt appeared very uncomfortable. Gave pt Ditropan 5 mg.  After 15 mins- pt had minimal relief. Pt refused [place] opium supp. Per Dr Bernardo Heater- he will send [place] to pt pharmacy to premedicate before he comes for treatment each week. Pt aware. Pt was able to hold Gemzar for 45 mins. Pt Asked this nurse to unclamp. Once unclamp- urine was blood tinged- with small clots. Pt states had relief once unclamped. Once drained- deflated balloon and withdrew foley catheter. Pt was discharged stable

## 2020-11-26 NOTE — Progress Notes (Signed)
Hematology/Oncology Consult note [facility] Telephone:([phone] Fax:([phone]) [phone]  Patient Care Team: Baxter Hire, MD as PCP - General (Internal Medicine) [name], MD as Radiation Oncologist (Radiation Oncology)   Name of the patient: [name]  [id number]  08/07/1945    Reason for referral-superficial bladder cancer   Referring physician-Dr. Bernardo Heater  Date of visit: 11/26/20   History of presenting illness- Patient is a 75 year old male with a past medical history significant for low-grade intermediate risk T1 a urothelial bladder cancer which recurred in June 2017.  Patient received 6 weekly courses of intravesical gemcitabine which she completed in August 2017.He has been undergoing surveillance cystoscopy with Dr. Bernardo Heater and underwent repeat cystoscopy which showed new tumor in the posterior wall of the bladder.  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  There was a small focus of high-grade histology seen comprising less than 5% of the tumor.  Muscularis propria was present in the specimen and uninvolved.  Patient referred to oncology for consideration of intravesical gemcitabine.  ECOG PS- 1  Pain scale- 0   Review of systems- Review of Systems  Constitutional:  Negative for chills, fever, malaise/fatigue and weight loss.  HENT:  Negative for congestion, ear discharge and nosebleeds.   Eyes:  Negative for blurred vision.  Respiratory:  Negative for cough, hemoptysis, sputum production, shortness of breath and wheezing.   Cardiovascular:  Negative for chest pain, palpitations, orthopnea and claudication.  Gastrointestinal:  Negative for abdominal pain, blood in stool, constipation, diarrhea, heartburn, melena, nausea and vomiting.  Genitourinary:  Positive for frequency. Negative for dysuria, flank pain, hematuria and urgency.  Musculoskeletal:  Negative for back  pain, joint pain and myalgias.  Skin:  Negative for rash.  Neurological:  Negative for dizziness, tingling, focal weakness, seizures, weakness and headaches.  Endo/Heme/Allergies:  Does not bruise/bleed easily.  Psychiatric/Behavioral:  Negative for depression and suicidal ideas. The patient does not have insomnia.    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 4v CABG on 04/13/2002. b.) LHC 02/03/2013 --> LVEF 50%; severe 3v CAD with patent LIMA-LAD and SVG-OM1 grafts; SVG-PDA and SVG-DG1 occluded with collaterals to PDA and DG1 from LAD.   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   a.) LIMA-LAD, SVG-OM1, SVG-D1, SVG-PDA   Tubular adenoma of colon 02/03/2007      Past Surgical History:  Procedure Laterality Date   CARDIAC CATHETERIZATION Left 04/13/2002   Procedure: CARDIAC CATHETERIZATION; Location: [facility]; Surgeon; Isaias Cowman, MD   CARDIAC CATHETERIZATION Left 02/03/2013   Procedure: CARDIAC CATHETERIZATION; Location: [facility]; Surgeon: Isaias Cowman, MD   COLONOSCOPY W/ POLYPECTOMY     COLONOSCOPY WITH PROPOFOL N/A 04/09/2015   Procedure: COLONOSCOPY WITH PROPOFOL;  Surgeon: Hulen Luster, MD;  Location: [facility] ENDOSCOPY;  Service: Gastroenterology;  Laterality: N/A;   CORONARY ANGIOPLASTY     CORONARY ARTERY BYPASS GRAFT N/A 04/13/2002   Procedure: 4v CABG (LIMA-LAD, SVG-OM1, SVG-D1, SVG-PDA); Location: [facility]; Surgeon: Corinna Capra, MD   TONSILLECTOMY     TRANSURETHRAL RESECTION OF BLADDER TUMOR N/A 11/13/2020   Procedure: TRANSURETHRAL RESECTION OF BLADDER TUMOR (TURBT);  Surgeon: Abbie Sons, MD;  Location: [facility] ORS;  Service: Urology;  Laterality: N/A;   TRANSURETHRAL RESECTION OF PROSTATE      Social History   Socioeconomic History   Marital status: Married    Spouse name: Not on file   Number of children: Not on file   Years of education: Not on file   Highest education level: Not on file  Occupational History   Not on file  Tobacco Use   Smoking status: Former    Packs/day: 0.75    Years: 60.00    Pack years: 45.00    Types: Cigarettes    Quit date: 2008    Years since quitting: 14.7   Smokeless tobacco: Never  Substance and Sexual Activity   Alcohol use: Yes    Alcohol/week: 2.0 standard drinks    Types: 2 Standard drinks or equivalent per week   Drug use: No   Sexual activity: Not on file  Other Topics Concern   Not on file  Social History Narrative   Not on file   Social Determinants of Health   Financial Resource Strain: Not on file  Food Insecurity: Not on file  Transportation Needs: Not on file  Physical Activity: Not on file  Stress: Not on file  Social Connections: Not on file  Intimate Partner  Violence: Not on file     Family History  Problem Relation Age of Onset   Prostate cancer Father        Metastatic   Cancer Father      Current Outpatient Medications:    Alpha Lipoic Acid 200 MG CAPS, Take 200 mg by mouth daily., Disp: , Rfl:    aspirin 81 MG tablet, Take 81 mg by mouth daily., Disp: , Rfl:    atorvastatin (LIPITOR) 10 MG tablet, Take 10 mg by mouth daily., Disp: , Rfl:    calcium & magnesium carbonates (MYLANTA) 311-232 MG tablet, Take 1 tablet by mouth daily as needed for heartburn., Disp: , Rfl:    cetirizine (ZYRTEC) 10 MG tablet, Take 10 mg by mouth at bedtime., Disp: , Rfl:    Cholecalciferol (VITAMIN D3 PO), Take 1,000 Units by mouth daily., Disp: , Rfl:    Coenzyme Q10 100 MG capsule, Take 100 mg by mouth daily. , Disp: , Rfl:    fluticasone (FLONASE) 50 MCG/ACT nasal spray, Place 1 spray into both nostrils at bedtime., Disp: , Rfl:    GLUCOSAMINE-CHONDROITIN PO, Take 1 tablet by mouth 2 (two) times daily., Disp: , Rfl:    isosorbide mononitrate (IMDUR) 30 MG 24 hr tablet, Take 30  mg by mouth every morning., Disp: , Rfl:    Magnesium 200 MG TABS, Take 200 mg by mouth 2 (two) times daily., Disp: , Rfl:    metoprolol tartrate (LOPRESSOR) 25 MG tablet, Take 25 mg by mouth 2 (two) times daily., Disp: , Rfl:    metroNIDAZOLE (METROGEL) 0.75 % gel, Apply 1 application topically 2 (two) times daily., Disp: , Rfl:    Multiple Vitamin (MULTIVITAMIN) tablet, Take 1 tablet by mouth daily., Disp: , Rfl:    naproxen sodium (ANAPROX) 220 MG tablet, Take 220 mg by mouth daily as needed (pain)., Disp: , Rfl:    Omega-3 Fatty Acids (FISH OIL) 1000 MG CAPS, Take 1,000 mg by mouth daily., Disp: , Rfl:    Probiotic Product (MEGA PROBIOTIC) CAPS, Take 1 capsule by mouth daily., Disp: , Rfl:    tamsulosin (FLOMAX) 0.4 MG CAPS capsule, Take 1 capsule (0.4 mg total) by mouth daily., Disp: 30 capsule, Rfl: 3   triamterene-hydrochlorothiazide (MAXZIDE-25) 37.5-25 MG tablet, Take 1  tablet by mouth daily., Disp: , Rfl:    Vitamin A 2400 MCG (8000 UT) CAPS, Take 8,000 Units by mouth daily., Disp: , Rfl:    vitamin B-12 (CYANOCOBALAMIN) 1000 MCG tablet, Take 1,000 mcg by mouth daily., Disp: , Rfl:    vitamin C (ASCORBIC ACID) 250 MG tablet, Take 250 mg by mouth 2 (two) times daily., Disp: , Rfl:    zinc gluconate 50 MG tablet, Take 50 mg by mouth daily., Disp: , Rfl:    b complex vitamins capsule, Take 1 capsule by mouth daily. (Patient not taking: Reported on 11/21/2020), Disp: , Rfl:    nitroGLYCERIN (NITROSTAT) 0.4 MG SL tablet, Place 0.4 mg under the tongue every 5 (five) minutes as needed for chest pain. (Patient not taking: Reported on 11/21/2020), Disp: , Rfl:    Potassium Gluconate 595 MG CAPS, Take 1 capsule by mouth daily as needed (After HT TR exercise). (Patient not taking: Reported on 11/21/2020), Disp: , Rfl:    Physical exam:  Vitals:   11/21/20 1343  BP: 101/62  Pulse: 70  Resp: 20  Temp: 98 F (36.7 C)  SpO2: 93%  Weight: 206 lb 9.6 oz (93.7 kg)   Physical Exam Cardiovascular:     Rate and Rhythm: Normal rate and regular rhythm.     Heart sounds: Normal heart sounds.  Pulmonary:     Effort: Pulmonary effort is normal.     Breath sounds: Normal breath sounds.  Abdominal:     General: Bowel sounds are normal.     Palpations: Abdomen is soft.  Skin:    General: Skin is warm and dry.  Neurological:     Mental Status: He is alert and oriented to person, place, and time.       CMP Latest Ref Rng & Units 11/09/2020  Glucose 70 - 99 mg/dL 139(H)  BUN 8 - 23 mg/dL 17  Creatinine 0.61 - 1.24 mg/dL 0.86  Sodium 135 - 145 mmol/L 138  Potassium 3.5 - 5.1 mmol/L 4.0  Chloride 98 - 111 mmol/L 103  CO2 22 - 32 mmol/L 25  Calcium 8.9 - 10.3 mg/dL 8.7(L)   CBC Latest Ref Rng & Units 07/14/2018  WBC 4.0 - 10.5 K/uL 6.6  Hemoglobin 13.0 - 17.0 g/dL 14.4  Hematocrit 39.0 - 52.0 % 41.5  Platelets 150 - 400 K/uL 173   Assessment and plan- Patient is a  75 y.o. male with noninvasive low-grade superficial bladder cancer referred for consideration of  intravesical gemcitabine  Patient has met with Dr. Bernardo Heater and has discussed recent pathology findings which showed recurrent low-grade superficial bladder cancer and there was a small focus of high-grade urothelial carcinoma less than 5% in the specimen.  Muscularis propria was uninvolved.  Dr. Bernardo Heater recommends intravesical gemcitabine and patient will receive weekly gemcitabine at a dose of 2000 mg x 6 doses.  Discussed that systemic side effects from gemcitabine are typically minimal.  Patient does have some symptoms of urinary frequency and urgency which can be post TURBT.   Thank you for this kind referral and the opportunity to participate in the care of this patient   Visit Diagnosis 1. Malignant neoplasm of posterior wall of urinary bladder (HCC)   2. Malignant neoplasm of prostate [facility])     Dr. Randa Evens, MD, MPH [facility] [phone] 11/26/2020

## 2020-11-27 ENCOUNTER — Other Ambulatory Visit: Payer: Self-pay | Admitting: Urology

## 2020-11-27 ENCOUNTER — Encounter: Payer: Self-pay | Admitting: Urology

## 2020-11-27 MED ORDER — HYOSCYAMINE SULFATE 0.125 MG SL SUBL: SUBLINGUAL_TABLET | SUBLINGUAL | 0 refills | Status: DC

## 2020-11-27 MED ORDER — OXYBUTYNIN CHLORIDE 5 MG PO TABS: ORAL_TABLET | ORAL | 0 refills | Status: DC

## 2020-12-02 ENCOUNTER — Encounter: Payer: Self-pay | Admitting: Oncology

## 2020-12-03 ENCOUNTER — Other Ambulatory Visit: Payer: Self-pay

## 2020-12-03 ENCOUNTER — Inpatient Hospital Stay: Payer: Medicare Other

## 2020-12-03 ENCOUNTER — Other Ambulatory Visit: Payer: Self-pay | Admitting: Oncology

## 2020-12-03 DIAGNOSIS — C679 Malignant neoplasm of bladder, unspecified: Secondary | ICD-10-CM

## 2020-12-03 DIAGNOSIS — R350 Frequency of micturition: Secondary | ICD-10-CM

## 2020-12-03 DIAGNOSIS — C61 Malignant neoplasm of prostate: Secondary | ICD-10-CM

## 2020-12-03 DIAGNOSIS — C674 Malignant neoplasm of posterior wall of bladder: Secondary | ICD-10-CM | POA: Diagnosis not present

## 2020-12-03 LAB — BASIC METABOLIC PANEL
Anion gap: 5 (ref 5–15)
BUN: 19 mg/dL (ref 8–23)
CO2: 27 mmol/L (ref 22–32)
Calcium: 8.9 mg/dL (ref 8.9–10.3)
Chloride: 104 mmol/L (ref 98–111)
Creatinine, Ser: 0.99 mg/dL (ref 0.61–1.24)
GFR, Estimated: >60 mL/min (ref >60)
Glucose, Bld: 136 mg/dL — ABNORMAL HIGH (ref 70–99)
Potassium: 4 mmol/L (ref 3.5–5.1)
Sodium: 136 mmol/L (ref 135–145)

## 2020-12-03 LAB — CBC WITH DIFFERENTIAL/PLATELET
Abs Immature Granulocytes: 0.02 10*3/uL (ref 0.00–0.07)
Basophils Absolute: 0.1 10*3/uL (ref 0.0–0.1)
Basophils Relative: 1 %
Eosinophils Absolute: 0.2 10*3/uL (ref 0.0–0.5)
Eosinophils Relative: 3 %
HCT: 45.7 % (ref 39.0–52.0)
Hemoglobin: 15.3 g/dL (ref 13.0–17.0)
Immature Granulocytes: 0 %
Lymphocytes Relative: 15 %
Lymphs Abs: 1 10*3/uL (ref 0.7–4.0)
MCH: 33.6 pg (ref 26.0–34.0)
MCHC: 33.5 g/dL (ref 30.0–36.0)
MCV: 100.2 fL — ABNORMAL HIGH (ref 80.0–100.0)
Monocytes Absolute: 0.6 10*3/uL (ref 0.1–1.0)
Monocytes Relative: 8 %
Neutro Abs: 4.8 10*3/uL (ref 1.7–7.7)
Neutrophils Relative %: 73 %
Platelets: 192 10*3/uL (ref 150–400)
RBC: 4.56 MIL/uL (ref 4.22–5.81)
RDW: 12.4 % (ref 11.5–15.5)
WBC: 6.6 10*3/uL (ref 4.0–10.5)
nRBC: 0 % (ref 0.0–0.2)

## 2020-12-03 LAB — URINALYSIS, COMPLETE (UACMP) WITH MICROSCOPIC
Bilirubin Urine: NEGATIVE
Glucose, UA: NEGATIVE mg/dL
Ketones, ur: NEGATIVE mg/dL
Nitrite: NEGATIVE
Protein, ur: 100 mg/dL — AB
RBC / HPF: >50 RBC/hpf — ABNORMAL HIGH (ref 0–5)
Specific Gravity, Urine: 1.018 (ref 1.005–1.030)
WBC, UA: >50 WBC/hpf — ABNORMAL HIGH (ref 0–5)
pH: 6 (ref 5.0–8.0)

## 2020-12-03 MED ORDER — CEFUROXIME AXETIL 250 MG PO TABS: 250.0000 mg | ORAL_TABLET | Freq: Two times a day (BID) | ORAL | 0 refills | Status: AC

## 2020-12-03 NOTE — Progress Notes (Signed)
Per Dr. Janese Banks and Jake Hoff, PA, intravesical Gemzar should not be given today after reviewing his urinalysis that indicated a UTI. Patient is complaining of urine frequency. Ordered to start oral antibiotics and add a urine culture. [name] made aware that he will need to start the oral antibiotic today and to call if he has any questions. Patient verbalized understanding and stable at discharge.

## 2020-12-03 NOTE — Progress Notes (Signed)
ro

## 2020-12-04 ENCOUNTER — Other Ambulatory Visit: Payer: Self-pay

## 2020-12-04 ENCOUNTER — Encounter: Payer: Self-pay | Admitting: Oncology

## 2020-12-04 LAB — URINE CULTURE: Culture: <10000 — AB

## 2020-12-04 MED ORDER — OXYBUTYNIN CHLORIDE 5 MG PO TABS
ORAL_TABLET | ORAL | 0 refills | Status: DC
Start: 2020-12-04 — End: 2021-03-02

## 2020-12-04 NOTE — Telephone Encounter (Signed)
Ok to refill oxybutynin. He has received 1 treatment so far and will need 5 more treatments

## 2020-12-05 ENCOUNTER — Inpatient Hospital Stay (HOSPITAL_BASED_OUTPATIENT_CLINIC_OR_DEPARTMENT_OTHER): Payer: Medicare Other | Admitting: Hospice and Palliative Medicine

## 2020-12-05 DIAGNOSIS — C674 Malignant neoplasm of posterior wall of bladder: Secondary | ICD-10-CM

## 2020-12-05 NOTE — Progress Notes (Signed)
Multidisciplinary Oncology Council Documentation  [name] was presented by our [facility] on 12/05/2020, which included representatives from:  Palliative Care Dietitian  Physical/Occupational Therapist Nurse Navigator Genetics Speech Therapist Social work Survivorship RN Financial Navigator Research RN   [name] currently presents with history of bladder cancer  We reviewed previous medical and familial history, history of present illness, and recent lab results along with all available histopathologic and imaging studies. The [place] considered available treatment options and made the following recommendations/referrals:  Recommend genetics  The MOC is a meeting of clinicians from various specialty areas who evaluate and discuss patients for whom a multidisciplinary approach is being considered. Final determinations in the plan of care are those of the provider(s).   Today's extended care, comprehensive team conference, [name] was not present for the discussion and was not examined.

## 2020-12-06 ENCOUNTER — Other Ambulatory Visit: Payer: Self-pay

## 2020-12-06 DIAGNOSIS — C674 Malignant neoplasm of posterior wall of bladder: Secondary | ICD-10-CM

## 2020-12-10 ENCOUNTER — Other Ambulatory Visit: Payer: Self-pay | Admitting: Internal Medicine

## 2020-12-10 ENCOUNTER — Encounter: Payer: Self-pay | Admitting: Urology

## 2020-12-10 ENCOUNTER — Inpatient Hospital Stay: Payer: Medicare Other

## 2020-12-10 ENCOUNTER — Telehealth: Payer: Self-pay | Admitting: *Deleted

## 2020-12-10 ENCOUNTER — Other Ambulatory Visit: Payer: Self-pay

## 2020-12-10 ENCOUNTER — Encounter: Payer: Self-pay | Admitting: Oncology

## 2020-12-10 VITALS — BP 116/74 | HR 57 | Temp 96.1°F | Resp 18 | Wt 205.2 lb

## 2020-12-10 DIAGNOSIS — C674 Malignant neoplasm of posterior wall of bladder: Secondary | ICD-10-CM

## 2020-12-10 DIAGNOSIS — C679 Malignant neoplasm of bladder, unspecified: Secondary | ICD-10-CM

## 2020-12-10 LAB — BASIC METABOLIC PANEL
Anion gap: 7 (ref 5–15)
BUN: 23 mg/dL (ref 8–23)
CO2: 28 mmol/L (ref 22–32)
Calcium: 9.1 mg/dL (ref 8.9–10.3)
Chloride: 101 mmol/L (ref 98–111)
Creatinine, Ser: 1.06 mg/dL (ref 0.61–1.24)
GFR, Estimated: >60 mL/min (ref >60)
Glucose, Bld: 139 mg/dL — ABNORMAL HIGH (ref 70–99)
Potassium: 4 mmol/L (ref 3.5–5.1)
Sodium: 136 mmol/L (ref 135–145)

## 2020-12-10 LAB — CBC WITH DIFFERENTIAL/PLATELET
Abs Immature Granulocytes: 0.03 10*3/uL (ref 0.00–0.07)
Basophils Absolute: 0.1 10*3/uL (ref 0.0–0.1)
Basophils Relative: 1 %
Eosinophils Absolute: 0.6 10*3/uL — ABNORMAL HIGH (ref 0.0–0.5)
Eosinophils Relative: 7 %
HCT: 45.5 % (ref 39.0–52.0)
Hemoglobin: 15.6 g/dL (ref 13.0–17.0)
Immature Granulocytes: 0 %
Lymphocytes Relative: 14 %
Lymphs Abs: 1.1 10*3/uL (ref 0.7–4.0)
MCH: 34.2 pg — ABNORMAL HIGH (ref 26.0–34.0)
MCHC: 34.3 g/dL (ref 30.0–36.0)
MCV: 99.8 fL (ref 80.0–100.0)
Monocytes Absolute: 0.7 10*3/uL (ref 0.1–1.0)
Monocytes Relative: 9 %
Neutro Abs: 5.6 10*3/uL (ref 1.7–7.7)
Neutrophils Relative %: 69 %
Platelets: 191 10*3/uL (ref 150–400)
RBC: 4.56 MIL/uL (ref 4.22–5.81)
RDW: 12.9 % (ref 11.5–15.5)
WBC: 8.2 10*3/uL (ref 4.0–10.5)
nRBC: 0 % (ref 0.0–0.2)

## 2020-12-10 LAB — URINALYSIS, COMPLETE (UACMP) WITH MICROSCOPIC
Bilirubin Urine: NEGATIVE
Glucose, UA: NEGATIVE mg/dL
Ketones, ur: 5 mg/dL — AB
Nitrite: NEGATIVE
Protein, ur: 100 mg/dL — AB
RBC / HPF: >50 RBC/hpf — ABNORMAL HIGH (ref 0–5)
Specific Gravity, Urine: 1.02 (ref 1.005–1.030)
WBC, UA: >50 WBC/hpf — ABNORMAL HIGH (ref 0–5)
pH: 5 (ref 5.0–8.0)

## 2020-12-10 MED ORDER — GEMCITABINE CHEMO FOR BLADDER INSTILLATION 2000 MG
2000.0000 mg | Freq: Once | INTRAVENOUS | Status: AC
  Administered 2020-12-10: 2000 mg via INTRAVESICAL
  Filled 2020-12-10: qty 52.6

## 2020-12-10 MED ORDER — PROCHLORPERAZINE MALEATE 10 MG PO TABS
10.0000 mg | ORAL_TABLET | Freq: Once | ORAL | Status: AC
  Administered 2020-12-10: 10 mg via ORAL
  Filled 2020-12-10: qty 1

## 2020-12-10 NOTE — Telephone Encounter (Signed)
Patient wanting to speak with someone regarding the fact that he cannot last the full 60 minutes with his Gemzar and what to do and who he should ask if he needs to have his spasm medicine changed [place] or urologist.

## 2020-12-10 NOTE — Progress Notes (Signed)
Per Beckey Rutter NP, she spoke with pt's urologist and ok to proceed with treatment today. Pt completed 7 days of abx yesterday. Pt states he took ditropan prior to arrival . Pt took levsin once arrived to cancer center infusion. Foley catheter inserted and drained cloudy yellow urine. Gemzar slowly instilled. Clamped foley catheter. Pt complained of some spasms still- but states not as bad as first time. Pt was able to hold for 50 mins and states pain / spasms too uncomfortable . Unclamped and drained urine. Deflated balloon - pt states pain/ spasms better. Removed catheter. Pt discharged stable

## 2020-12-11 ENCOUNTER — Encounter: Payer: Self-pay | Admitting: Oncology

## 2020-12-11 NOTE — Telephone Encounter (Signed)
Per chart, patient has reached out to Dr [facility] office regarding this matter via My Chart message.

## 2020-12-11 NOTE — Telephone Encounter (Signed)
I called patient back and got voice mail, I left message for him to return my call

## 2020-12-11 NOTE — Telephone Encounter (Signed)
He needs to reach out to urology Dr. Dagoberto Reef team

## 2020-12-13 ENCOUNTER — Other Ambulatory Visit: Payer: Self-pay | Admitting: *Deleted

## 2020-12-14 ENCOUNTER — Telehealth: Payer: Self-pay | Admitting: *Deleted

## 2020-12-14 ENCOUNTER — Encounter: Payer: Self-pay | Admitting: *Deleted

## 2020-12-14 NOTE — Telephone Encounter (Signed)
I called patient to let him know that he needed to contact Dr Bernardo Heater regarding B & O suppositry use. We ended up having a 20 minute discussion about B & O and his previous experiences with his treatment  years ago and recently with other medications as well as him wanting assurance that he would not have to stay in hospital over night if B & O was used. I advised him that I could not assure him that he would not have to stay due to not knowing how he will react to it as he has never had drug before. He agree to contact Dr Bernardo Heater again

## 2020-12-14 NOTE — Telephone Encounter (Signed)
Patient called stating that a nurse and his urologist both mentioned possibly using a B & O suppository for his bladder spasms and he is asking if we have those on hand how long it would take it to take effect and to please let him know how this would work. Please advise

## 2020-12-14 NOTE — Telephone Encounter (Signed)
Patient called regarding B&O suppository. Would like something to be ordered to have on hand at time of appointment on Monday. He does NOT want to spend overnight in the hospital if it is administered. Please advise

## 2020-12-14 NOTE — Telephone Encounter (Signed)
Please ask him to reach out to urology for this

## 2020-12-14 NOTE — Telephone Encounter (Signed)
The suppositories not available in outside pharmacies and would have to be special ordered.  I have never had a patient who had a suppository placed in the hospital who has had to stay overnight so the chances of that are extremely low

## 2020-12-14 NOTE — Telephone Encounter (Signed)
I was reading his chart and he has discussed this with Dr Bernardo Heater. I will tell him to call him to let him know to order this for [place] to use.   Jake Sons, MD to [name]"     4:04 PM Good afternoon, the best medication we have for bladder spasms is a medication in suppository form which has belladonna and opium (B&O suppository).  It would work better than the oral medications however due to the narcotic component it is possible you may have a little drowsiness.  If you are interested in trying I can get in touch with the oncology folks to have this ordered.  If you utilize the suppository you would not need the oral medication.  Thanks, scs  Last read by [name] at 11:01 AM on 12/14/2020.

## 2020-12-17 ENCOUNTER — Encounter: Payer: Self-pay | Admitting: Oncology

## 2020-12-17 ENCOUNTER — Inpatient Hospital Stay (HOSPITAL_BASED_OUTPATIENT_CLINIC_OR_DEPARTMENT_OTHER): Payer: Medicare Other | Admitting: Oncology

## 2020-12-17 ENCOUNTER — Inpatient Hospital Stay: Payer: Medicare Other

## 2020-12-17 ENCOUNTER — Encounter: Payer: Self-pay | Admitting: Urology

## 2020-12-17 ENCOUNTER — Other Ambulatory Visit: Payer: Self-pay

## 2020-12-17 VITALS — BP 137/71 | HR 62 | Resp 20

## 2020-12-17 VITALS — BP 117/63 | HR 71 | Temp 97.2°F | Wt 205.0 lb

## 2020-12-17 DIAGNOSIS — Z5111 Encounter for antineoplastic chemotherapy: Secondary | ICD-10-CM

## 2020-12-17 DIAGNOSIS — C671 Malignant neoplasm of dome of bladder: Secondary | ICD-10-CM | POA: Diagnosis not present

## 2020-12-17 DIAGNOSIS — C679 Malignant neoplasm of bladder, unspecified: Secondary | ICD-10-CM

## 2020-12-17 DIAGNOSIS — C61 Malignant neoplasm of prostate: Secondary | ICD-10-CM

## 2020-12-17 DIAGNOSIS — C674 Malignant neoplasm of posterior wall of bladder: Secondary | ICD-10-CM | POA: Diagnosis not present

## 2020-12-17 LAB — URINALYSIS, COMPLETE (UACMP) WITH MICROSCOPIC
Bilirubin Urine: NEGATIVE
Glucose, UA: NEGATIVE mg/dL
Ketones, ur: NEGATIVE mg/dL
Nitrite: NEGATIVE
Protein, ur: 100 mg/dL — AB
RBC / HPF: >50 RBC/hpf — ABNORMAL HIGH (ref 0–5)
Specific Gravity, Urine: 1.023 (ref 1.005–1.030)
Squamous Epithelial / HPF: NONE SEEN (ref 0–5)
WBC, UA: >50 WBC/hpf — ABNORMAL HIGH (ref 0–5)
pH: 5 (ref 5.0–8.0)

## 2020-12-17 LAB — CBC WITH DIFFERENTIAL/PLATELET
Abs Immature Granulocytes: 0.03 10*3/uL (ref 0.00–0.07)
Basophils Absolute: 0.1 10*3/uL (ref 0.0–0.1)
Basophils Relative: 1 %
Eosinophils Absolute: 0.2 10*3/uL (ref 0.0–0.5)
Eosinophils Relative: 4 %
HCT: 45.2 % (ref 39.0–52.0)
Hemoglobin: 15.5 g/dL (ref 13.0–17.0)
Immature Granulocytes: 1 %
Lymphocytes Relative: 17 %
Lymphs Abs: 1 10*3/uL (ref 0.7–4.0)
MCH: 34.2 pg — ABNORMAL HIGH (ref 26.0–34.0)
MCHC: 34.3 g/dL (ref 30.0–36.0)
MCV: 99.8 fL (ref 80.0–100.0)
Monocytes Absolute: 0.6 10*3/uL (ref 0.1–1.0)
Monocytes Relative: 10 %
Neutro Abs: 4 10*3/uL (ref 1.7–7.7)
Neutrophils Relative %: 67 %
Platelets: 208 10*3/uL (ref 150–400)
RBC: 4.53 MIL/uL (ref 4.22–5.81)
RDW: 13.2 % (ref 11.5–15.5)
WBC: 5.9 10*3/uL (ref 4.0–10.5)
nRBC: 0 % (ref 0.0–0.2)

## 2020-12-17 LAB — BASIC METABOLIC PANEL
Anion gap: 7 (ref 5–15)
BUN: 19 mg/dL (ref 8–23)
CO2: 27 mmol/L (ref 22–32)
Calcium: 8.8 mg/dL — ABNORMAL LOW (ref 8.9–10.3)
Chloride: 102 mmol/L (ref 98–111)
Creatinine, Ser: 0.96 mg/dL (ref 0.61–1.24)
GFR, Estimated: >60 mL/min (ref >60)
Glucose, Bld: 154 mg/dL — ABNORMAL HIGH (ref 70–99)
Potassium: 3.8 mmol/L (ref 3.5–5.1)
Sodium: 136 mmol/L (ref 135–145)

## 2020-12-17 MED ORDER — GEMCITABINE CHEMO FOR BLADDER INSTILLATION 2000 MG
2000.0000 mg | Freq: Once | INTRAVENOUS | Status: AC
  Administered 2020-12-17: 2000 mg via INTRAVESICAL
  Filled 2020-12-17: qty 52.6

## 2020-12-17 MED ORDER — PROCHLORPERAZINE MALEATE 10 MG PO TABS
10.0000 mg | ORAL_TABLET | Freq: Once | ORAL | Status: AC
  Administered 2020-12-17: 10 mg via ORAL
  Filled 2020-12-17: qty 1

## 2020-12-17 NOTE — Progress Notes (Signed)
Pt has concerns regarding bladder spasms. Pt has questions about B&O suppository.

## 2020-12-17 NOTE — Progress Notes (Signed)
Proceed with treatment today per Dr. Janese Banks.  She is aware of all lab results and urinalysis.

## 2020-12-17 NOTE — Patient Instructions (Signed)
[facility]  Discharge Instructions: Thank you for choosing [facility] to provide your oncology and hematology care.  If you have a lab appointment with the Cancer Center, please go directly to the Cancer Center and check in at the registration area.  Wear comfortable clothing and clothing appropriate for easy access to any Portacath or PICC line.   We strive to give you quality time with your provider. You may need to reschedule your appointment if you arrive late (15 or more minutes).  Arriving late affects you and other patients whose appointments are after yours.  Also, if you miss three or more appointments without notifying the office, you may be dismissed from the clinic at the provider's discretion.      For prescription refill requests, have your pharmacy contact our office and allow 72 hours for refills to be completed.    Today you received the following chemotherapy and/or immunotherapy agents       To help prevent nausea and vomiting after your treatment, we encourage you to take your nausea medication as directed.  BELOW ARE SYMPTOMS THAT SHOULD BE REPORTED IMMEDIATELY: *FEVER GREATER THAN 100.4 F (38 C) OR HIGHER *CHILLS OR SWEATING *NAUSEA AND VOMITING THAT IS NOT CONTROLLED WITH YOUR NAUSEA MEDICATION *UNUSUAL SHORTNESS OF BREATH *UNUSUAL BRUISING OR BLEEDING *URINARY PROBLEMS (pain or burning when urinating, or frequent urination) *BOWEL PROBLEMS (unusual diarrhea, constipation, pain near the anus) TENDERNESS IN MOUTH AND THROAT WITH OR WITHOUT PRESENCE OF ULCERS (sore throat, sores in mouth, or a toothache) UNUSUAL RASH, SWELLING OR PAIN  UNUSUAL VAGINAL DISCHARGE OR ITCHING   Items with * indicate a potential emergency and should be followed up as soon as possible or go to the Emergency Department if any problems should occur.  Please show the CHEMOTHERAPY ALERT CARD or IMMUNOTHERAPY ALERT CARD at check-in to the  Emergency Department and triage nurse.  Should you have questions after your visit or need to cancel or reschedule your appointment, please contact [facility]  [phone] and follow the prompts.  Office hours are 8:00 a.m. to 4:30 p.m. Monday - Friday. Please note that voicemails left after 4:00 p.m. may not be returned until the following business day.  We are closed weekends and major holidays. You have access to a nurse at all times for urgent questions. Please call the main number to the clinic [phone] and follow the prompts.  For any non-urgent questions, you may also contact your provider using MyChart. We now offer e-Visits for anyone 18 and older to request care online for non-urgent symptoms. For details visit [email or web address].   Also download the MyChart app! Go to the app store, search "MyChart", open the app, select [facility], and log in with your MyChart username and password.  Due to Covid, a mask is required upon entering the hospital/clinic. If you do not have a mask, one will be given to you upon arrival. For doctor visits, patients may have 1 support person aged 18 or older with them. For treatment visits, patients cannot have anyone with them due to current Covid guidelines and our immunocompromised population.  

## 2020-12-17 NOTE — Progress Notes (Signed)
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  Patient received 6 weekly courses of intravesical gemcitabine which she completed in August 2017.He has been undergoing surveillance cystoscopy with Dr. Bernardo Heater and underwent repeat cystoscopy which showed new tumor in the posterior wall of the bladder.  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  There was a small focus of high-grade histology seen comprising less than 5% of the tumor.  Muscularis propria was present in the specimen and uninvolved.  Patient referred to oncology for consideration of intravesical gemcitabine.    Interval history-patient does get significant bladder spasms with each treatment.  He has been using oxybutynin right before he comes for his treatments.  Also reports urinary frequency and urgency which has been the case since he he got his TURBT done.  Denies any flank pain fever or dysuria.  ECOG PS- 1 Pain scale- 0   Review of systems- Review of Systems  Constitutional:  Negative for chills, fever, malaise/fatigue and weight loss.  HENT:  Negative for congestion, ear discharge and nosebleeds.   Eyes:  Negative for blurred vision.  Respiratory:  Negative for cough, hemoptysis, sputum production, shortness of breath and  wheezing.   Cardiovascular:  Negative for chest pain, palpitations, orthopnea and claudication.  Gastrointestinal:  Negative for abdominal pain, blood in stool, constipation, diarrhea, heartburn, melena, nausea and vomiting.  Genitourinary:  Positive for frequency and urgency. Negative for dysuria, flank pain and hematuria.  Musculoskeletal:  Negative for back pain, joint pain and myalgias.  Skin:  Negative for rash.  Neurological:  Negative for dizziness, tingling, focal weakness, seizures, weakness and headaches.  Endo/Heme/Allergies:  Does not bruise/bleed easily.  Psychiatric/Behavioral:  Negative for depression and suicidal ideas. The patient does not have insomnia.      Allergies  Allergen Reactions   Cidex [Glutaral] Anaphylaxis     Past Medical History:  Diagnosis Date   Bladder cancer ([place])    Coronary artery disease    a.) LHC 04/12/2002 --> LVEF 42%; severe 3v CAD. 4v CABG on 04/13/2002. b.) LHC 02/03/2013 --> LVEF 50%; severe 3v CAD with patent LIMA-LAD and SVG-OM1 grafts; SVG-PDA and SVG-DG1 occluded with collaterals to PDA and DG1 from LAD.   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   a.) LIMA-LAD, SVG-OM1, SVG-D1, SVG-PDA   Tubular adenoma of colon 02/03/2007     Past Surgical History:  Procedure Laterality Date   CARDIAC CATHETERIZATION Left 04/13/2002   Procedure: CARDIAC CATHETERIZATION; Location: [facility]; Surgeon; Isaias Cowman, MD   CARDIAC CATHETERIZATION Left 02/03/2013   Procedure: CARDIAC CATHETERIZATION; Location: [facility];  Surgeon: Isaias Cowman, MD   COLONOSCOPY W/ POLYPECTOMY     COLONOSCOPY WITH PROPOFOL N/A 04/09/2015   Procedure: COLONOSCOPY WITH PROPOFOL;   Surgeon: Hulen Luster, MD;  Location: [facility] ENDOSCOPY;  Service: Gastroenterology;  Laterality: N/A;   CORONARY ANGIOPLASTY     CORONARY ARTERY BYPASS GRAFT N/A 04/13/2002   Procedure: 4v CABG (LIMA-LAD, SVG-OM1, SVG-D1, SVG-PDA); Location: [facility]; Surgeon: Corinna Capra, MD   TONSILLECTOMY     TRANSURETHRAL RESECTION OF BLADDER TUMOR N/A 11/13/2020   Procedure: TRANSURETHRAL RESECTION OF BLADDER TUMOR (TURBT);  Surgeon: Abbie Sons, MD;  Location: [facility] ORS;  Service: Urology;  Laterality: N/A;   TRANSURETHRAL RESECTION OF PROSTATE      Social History   Socioeconomic History   Marital status: Married    Spouse name: Not on file   Number of children: Not on file   Years of education: Not on file   Highest education level: Not on file  Occupational History   Not on file  Tobacco Use   Smoking status: Former    Packs/day: 0.75    Years: 60.00    Pack years: 45.00    Types: Cigarettes    Quit date: 2008    Years since quitting: 14.8   Smokeless tobacco: Never  Substance and Sexual Activity   Alcohol use: Yes    Alcohol/week: 2.0 standard drinks    Types: 2 Standard drinks or equivalent per week   Drug use: No   Sexual activity: Not on file  Other Topics Concern   Not on file  Social History Narrative   Not on file   Social Determinants of Health   Financial Resource Strain: Not on file  Food Insecurity: Not on file  Transportation Needs: Not on file  Physical Activity: Not on file  Stress: Not on file  Social Connections: Not on file  Intimate Partner Violence: Not on file    Family History  Problem Relation Age of Onset   Prostate cancer Father        Metastatic   Cancer Father      Current Outpatient Medications:    Alpha Lipoic Acid 200 MG CAPS, Take 200 mg by mouth daily., Disp: , Rfl:    aspirin 81 MG tablet, Take 81 mg by mouth daily., Disp: , Rfl:    atorvastatin (LIPITOR) 10 MG tablet, Take 10 mg by mouth daily., Disp: , Rfl:    calcium & magnesium carbonates  (MYLANTA) 311-232 MG tablet, Take 1 tablet by mouth daily as needed for heartburn., Disp: , Rfl:    cetirizine (ZYRTEC) 10 MG tablet, Take 10 mg by mouth at bedtime., Disp: , Rfl:    Cholecalciferol (VITAMIN D3 PO), Take 1,000 Units by mouth daily., Disp: , Rfl:    Coenzyme Q10 100 MG capsule, Take 100 mg by mouth daily. , Disp: , Rfl:    fluticasone (FLONASE) 50 MCG/ACT nasal spray, Place 1 spray into both nostrils at bedtime., Disp: , Rfl:    GLUCOSAMINE-CHONDROITIN PO, Take 1 tablet by mouth 2 (two) times daily., Disp: , Rfl:    hyoscyamine (LEVSIN SL) 0.125 MG SL tablet, 2 tabs dissolved under tongue just prior to catheter placement, Disp: 10 tablet, Rfl: 0   isosorbide mononitrate (IMDUR) 30 MG 24 hr tablet, Take 30 mg by mouth every morning., Disp: , Rfl:    Magnesium 200 MG TABS, Take 200 mg by mouth 2 (two) times daily., Disp: , Rfl:    metoprolol tartrate (LOPRESSOR) 25 MG tablet, Take  25 mg by mouth 2 (two) times daily., Disp: , Rfl:    metroNIDAZOLE (METROGEL) 0.75 % gel, Apply 1 application topically 2 (two) times daily., Disp: , Rfl:    Multiple Vitamin (MULTIVITAMIN) tablet, Take 1 tablet by mouth daily., Disp: , Rfl:    naproxen sodium (ANAPROX) 220 MG tablet, Take 220 mg by mouth daily as needed (pain)., Disp: , Rfl:    Omega-3 Fatty Acids (FISH OIL) 1000 MG CAPS, Take 1,000 mg by mouth daily., Disp: , Rfl:    oxybutynin (DITROPAN) 5 MG tablet, 1 tab p.o. 1 hour prior to bladder treatment, Disp: 5 tablet, Rfl: 0   Probiotic Product (MEGA PROBIOTIC) CAPS, Take 1 capsule by mouth daily., Disp: , Rfl:    tamsulosin (FLOMAX) 0.4 MG CAPS capsule, Take 1 capsule (0.4 mg total) by mouth daily., Disp: 30 capsule, Rfl: 3   triamterene-hydrochlorothiazide (MAXZIDE-25) 37.5-25 MG tablet, Take 1 tablet by mouth daily., Disp: , Rfl:    Vitamin A 2400 MCG (8000 UT) CAPS, Take 8,000 Units by mouth daily., Disp: , Rfl:    vitamin B-12 (CYANOCOBALAMIN) 1000 MCG tablet, Take 1,000 mcg by mouth  daily., Disp: , Rfl:    vitamin C (ASCORBIC ACID) 250 MG tablet, Take 250 mg by mouth 2 (two) times daily., Disp: , Rfl:    zinc gluconate 50 MG tablet, Take 50 mg by mouth daily., Disp: , Rfl:    b complex vitamins capsule, Take 1 capsule by mouth daily. (Patient not taking: No sig reported), Disp: , Rfl:    nitroGLYCERIN (NITROSTAT) 0.4 MG SL tablet, Place 0.4 mg under the tongue every 5 (five) minutes as needed for chest pain. (Patient not taking: No sig reported), Disp: , Rfl:    Potassium Gluconate 595 MG CAPS, Take 1 capsule by mouth daily as needed (After HT TR exercise). (Patient not taking: No sig reported), Disp: , Rfl:   Physical exam:  Vitals:   12/17/20 1012  BP: 117/63  Pulse: 71  Temp: (!) 97.2 F (36.2 C)  SpO2: 98%  Weight: 205 lb (93 kg)   Physical Exam Constitutional:      General: He is not in acute distress. Cardiovascular:     Rate and Rhythm: Normal rate and regular rhythm.     Heart sounds: Normal heart sounds.  Pulmonary:     Effort: Pulmonary effort is normal.     Breath sounds: Normal breath sounds.  Abdominal:     General: Bowel sounds are normal.     Palpations: Abdomen is soft.  Skin:    General: Skin is warm and dry.  Neurological:     Mental Status: He is alert and oriented to person, place, and time.     CMP Latest Ref Rng & Units 12/17/2020  Glucose 70 - 99 mg/dL 154(H)  BUN 8 - 23 mg/dL 19  Creatinine 0.61 - 1.24 mg/dL 0.96  Sodium 135 - 145 mmol/L 136  Potassium 3.5 - 5.1 mmol/L 3.8  Chloride 98 - 111 mmol/L 102  CO2 22 - 32 mmol/L 27  Calcium 8.9 - 10.3 mg/dL 8.8(L)   CBC Latest Ref Rng & Units 12/17/2020  WBC 4.0 - 10.5 K/uL 5.9  Hemoglobin 13.0 - 17.0 g/dL 15.5  Hematocrit 39.0 - 52.0 % 45.2  Platelets 150 - 400 K/uL 208    Assessment and plan- Patient is a 75 y.o. male with superficial bladder cancer here for on treatment assessment prior to cycle 3 of weekly gemcitabine chemotherapy intravesical  Patient  did not receive  his treatment 2 weeks ago as there was concern for possible UTI based on his urinalysis and patient was given a week's course of cefdinir.  Patient has had chronic frequency and urgency since he underwent TURBT but reports no new complaints at this time.  His urinalysis is always shown bacteria and WBCs.  Given no new symptoms I am not inclined to treat him with empiric antibiotics again or check a urine culture at this time.  He has had C. difficile in the past due to antibiotic use as well.  He will proceed with cycle 3 of weekly gemcitabine intravesically today and for the next 2 weeks and I will see him back in 3 weeks for cycle 6.  He does have the option to take B&O suppository prior to his intravesical instillation to better control his bladder spasms.  He is also on Myrbetriq   Visit Diagnosis 1. Encounter for antineoplastic chemotherapy   2. 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 Randa Evens, MD, MPH [facility] at [facility] [phone] 12/17/2020 12:46 PM

## 2020-12-24 ENCOUNTER — Other Ambulatory Visit: Payer: Self-pay

## 2020-12-24 ENCOUNTER — Inpatient Hospital Stay: Payer: Medicare Other | Attending: Radiation Oncology

## 2020-12-24 ENCOUNTER — Telehealth: Payer: Self-pay | Admitting: *Deleted

## 2020-12-24 ENCOUNTER — Inpatient Hospital Stay: Payer: Medicare Other

## 2020-12-24 VITALS — BP 130/79 | HR 66 | Temp 99.3°F | Resp 20

## 2020-12-24 DIAGNOSIS — G893 Neoplasm related pain (acute) (chronic): Secondary | ICD-10-CM

## 2020-12-24 DIAGNOSIS — C61 Malignant neoplasm of prostate: Secondary | ICD-10-CM

## 2020-12-24 DIAGNOSIS — Z87891 Personal history of nicotine dependence: Secondary | ICD-10-CM | POA: Insufficient documentation

## 2020-12-24 DIAGNOSIS — C679 Malignant neoplasm of bladder, unspecified: Secondary | ICD-10-CM

## 2020-12-24 DIAGNOSIS — R3915 Urgency of urination: Secondary | ICD-10-CM | POA: Insufficient documentation

## 2020-12-24 DIAGNOSIS — C674 Malignant neoplasm of posterior wall of bladder: Secondary | ICD-10-CM | POA: Diagnosis present

## 2020-12-24 DIAGNOSIS — Z79899 Other long term (current) drug therapy: Secondary | ICD-10-CM | POA: Diagnosis not present

## 2020-12-24 DIAGNOSIS — Z7982 Long term (current) use of aspirin: Secondary | ICD-10-CM | POA: Diagnosis not present

## 2020-12-24 DIAGNOSIS — Z5111 Encounter for antineoplastic chemotherapy: Secondary | ICD-10-CM | POA: Insufficient documentation

## 2020-12-24 LAB — URINALYSIS, COMPLETE (UACMP) WITH MICROSCOPIC
Bilirubin Urine: NEGATIVE
Glucose, UA: NEGATIVE mg/dL
Ketones, ur: NEGATIVE mg/dL
Nitrite: NEGATIVE
Protein, ur: 100 mg/dL — AB
Specific Gravity, Urine: >1.03 — ABNORMAL HIGH (ref 1.005–1.030)
pH: 6 (ref 5.0–8.0)

## 2020-12-24 LAB — PSA: Prostatic Specific Antigen: 0.03 ng/mL (ref 0.00–4.00)

## 2020-12-24 MED ORDER — OXYBUTYNIN CHLORIDE 5 MG PO TABS: 5.0000 mg | ORAL_TABLET | Freq: Once | ORAL | Status: DC | PRN

## 2020-12-24 MED ORDER — GEMCITABINE CHEMO FOR BLADDER INSTILLATION 2000 MG
2000.0000 mg | Freq: Once | INTRAVENOUS | Status: AC
  Administered 2020-12-24: 2000 mg via INTRAVESICAL
  Filled 2020-12-24: qty 52.6

## 2020-12-24 MED ORDER — LORAZEPAM 2 MG/ML IJ SOLN
0.5000 mg | Freq: Once | INTRAMUSCULAR | Status: AC
  Administered 2020-12-24: 0.5 mg via INTRAVENOUS
  Filled 2020-12-24: qty 1

## 2020-12-24 MED ORDER — BELLADONNA ALKALOIDS-OPIUM 16.2-60 MG RE SUPP
1.0000 | Freq: Once | RECTAL | Status: AC | PRN
  Administered 2020-12-24: 1 via RECTAL
  Filled 2020-12-24: qty 1

## 2020-12-24 MED ORDER — PROCHLORPERAZINE MALEATE 10 MG PO TABS
10.0000 mg | ORAL_TABLET | Freq: Once | ORAL | Status: AC
  Administered 2020-12-24: 10 mg via ORAL
  Filled 2020-12-24: qty 1

## 2020-12-24 MED ORDER — BELLADONNA ALKALOIDS-OPIUM 16.2-60 MG RE SUPP: 1.0000 | Freq: Once | RECTAL | Status: DC | PRN

## 2020-12-24 NOTE — Progress Notes (Signed)
16 Fr. Foley catheter inserted without difficulty. B&O suppository given as ordered. Bladder spasms started within a couple of minutes after Gemzar instillation. Patient complained of continued bladder spasms and asked for something else to be given. Per Dr. Elroy Channel verbal order, gave patient Ativan 0.5 mg IV. Patient reported some relief although bladder spasms continued throughout the instillation. Patient held the medication in his bladder for 59 minutes. Foley catheter removed without difficulty.

## 2020-12-24 NOTE — Telephone Encounter (Signed)
Patient called to request that he receive the same drug regimen with his next two treatments That he received today he reports that his discomfort was greatly reduced today. He also would like to know if he can have a glass of wine tonight to celebrate being half way through treatment.

## 2020-12-24 NOTE — Telephone Encounter (Signed)
Called patient to advise him that Dr. Janese Banks had received his message regarding his next treatment and she gave the ok for one glass of wine.

## 2020-12-24 NOTE — Telephone Encounter (Signed)
Yes he can have a glass of wine

## 2020-12-25 ENCOUNTER — Encounter: Payer: Self-pay | Admitting: Oncology

## 2020-12-26 ENCOUNTER — Other Ambulatory Visit: Payer: Self-pay | Admitting: Oncology

## 2020-12-31 ENCOUNTER — Other Ambulatory Visit: Payer: Self-pay

## 2020-12-31 ENCOUNTER — Inpatient Hospital Stay: Payer: Medicare Other

## 2020-12-31 ENCOUNTER — Encounter: Payer: Self-pay | Admitting: Oncology

## 2020-12-31 ENCOUNTER — Ambulatory Visit: Payer: Medicare Other | Admitting: Radiation Oncology

## 2020-12-31 VITALS — BP 134/63 | HR 68 | Temp 97.8°F | Resp 22 | Wt 205.0 lb

## 2020-12-31 DIAGNOSIS — N3289 Other specified disorders of bladder: Secondary | ICD-10-CM

## 2020-12-31 DIAGNOSIS — G893 Neoplasm related pain (acute) (chronic): Secondary | ICD-10-CM

## 2020-12-31 DIAGNOSIS — C679 Malignant neoplasm of bladder, unspecified: Secondary | ICD-10-CM

## 2020-12-31 DIAGNOSIS — Z5111 Encounter for antineoplastic chemotherapy: Secondary | ICD-10-CM | POA: Diagnosis not present

## 2020-12-31 DIAGNOSIS — C61 Malignant neoplasm of prostate: Secondary | ICD-10-CM

## 2020-12-31 LAB — URINALYSIS, COMPLETE (UACMP) WITH MICROSCOPIC
Bilirubin Urine: NEGATIVE
Glucose, UA: NEGATIVE mg/dL
Ketones, ur: NEGATIVE mg/dL
Nitrite: NEGATIVE
Protein, ur: 100 mg/dL — AB
RBC / HPF: >50 RBC/hpf — ABNORMAL HIGH (ref 0–5)
Specific Gravity, Urine: 1.023 (ref 1.005–1.030)
WBC, UA: >50 WBC/hpf — ABNORMAL HIGH (ref 0–5)
pH: 5 (ref 5.0–8.0)

## 2020-12-31 LAB — PSA: Prostatic Specific Antigen: 0.03 ng/mL (ref 0.00–4.00)

## 2020-12-31 MED ORDER — LORAZEPAM 2 MG/ML IJ SOLN
0.5000 mg | Freq: Once | INTRAMUSCULAR | Status: AC
  Administered 2020-12-31: 0.5 mg via INTRAVENOUS
  Filled 2020-12-31: qty 1

## 2020-12-31 MED ORDER — GEMCITABINE CHEMO FOR BLADDER INSTILLATION 2000 MG
2000.0000 mg | Freq: Once | INTRAVENOUS | Status: AC
  Administered 2020-12-31: 2000 mg via INTRAVESICAL
  Filled 2020-12-31: qty 52.6

## 2020-12-31 MED ORDER — PROCHLORPERAZINE MALEATE 10 MG PO TABS
10.0000 mg | ORAL_TABLET | Freq: Once | ORAL | Status: AC
  Administered 2020-12-31: 10 mg via ORAL
  Filled 2020-12-31: qty 1

## 2020-12-31 MED ORDER — SODIUM CHLORIDE 0.9 % IV SOLN
Freq: Once | INTRAVENOUS | Status: DC
  Filled 2020-12-31: qty 250

## 2020-12-31 MED ORDER — LIDOCAINE HCL URETHRAL/MUCOSAL 2 % EX GEL: 1.0000 "application " | Freq: Once | CUTANEOUS | Status: DC

## 2020-12-31 MED ORDER — BELLADONNA ALKALOIDS-OPIUM 16.2-60 MG RE SUPP
1.0000 | Freq: Once | RECTAL | Status: AC | PRN
  Administered 2020-12-31: 1 via RECTAL
  Filled 2020-12-31: qty 1

## 2020-12-31 NOTE — Progress Notes (Addendum)
IV started in R arm. Patient given Ativan 0.5 mg IV prior to foley insertion. 16 FR Foley catheter inserted without difficulty. B and O suppository given 30 minutes prior to chemotherapy instillation. Additional Ativan requested by patient half way through treatment because of bladder spasms. Per Dr. Janese Banks, gave 2nd dose of Ativan 0.5 mg IV. Patient held the chemotherapy in his bladder for 60 minutes. Foley discontinued, peripheral IV discontinued.Patient discharged in stable condition.

## 2021-01-03 ENCOUNTER — Other Ambulatory Visit: Payer: Self-pay

## 2021-01-03 ENCOUNTER — Encounter: Payer: Self-pay | Admitting: Radiation Oncology

## 2021-01-03 ENCOUNTER — Ambulatory Visit
Admission: RE | Admit: 2021-01-03 | Discharge: 2021-01-03 | Disposition: A | Discharge status: Home / Self Care | Payer: Medicare Other | Source: Ambulatory Visit | Attending: Radiation Oncology | Admitting: Radiation Oncology

## 2021-01-03 VITALS — BP 126/78 | HR 72 | Temp 98.0°F | Resp 18 | Wt 204.0 lb

## 2021-01-03 DIAGNOSIS — C61 Malignant neoplasm of prostate: Secondary | ICD-10-CM | POA: Insufficient documentation

## 2021-01-03 DIAGNOSIS — Z923 Personal history of irradiation: Secondary | ICD-10-CM | POA: Diagnosis not present

## 2021-01-03 NOTE — Progress Notes (Signed)
Radiation Oncology Follow up Note  Name: [name]   Date:   01/03/2021 MRN:  [id number] DOB: January 17, 1946    This 75 y.o. male presents to the clinic today for 2-year follow-up status post IMRT radiation therapy for Gleason 7 adenocarcinoma the prostate presenting with a PSA of 17.  REFERRING PROVIDER: Baxter Hire, MD  HPI: Patient is a 75 year old male now out 2 years having completed IMRT radiation therapy for Gleason 7 (4+3) adenocarcinoma the prostate presenting with a PSA of 17.  He is seen today in routine follow-up he continues to have some problems with urgency and frequency.  He is under treatment with intravesical gemcitabine for a T1 a urothelial bladder cancer which recurred in June 2017 he had been undergoing surveillance cystoscopy and a new small noninvasive papillary urothelial carcinoma with a small focus of high-grade histology was seen.  He is been seeing medical oncology for intravesical gemcitabine which may be the reason behind some of his urgency and frequency issues.  His most recent PSA is less than 7.34  COMPLICATIONS OF TREATMENT: none  FOLLOW UP COMPLIANCE: keeps appointments   PHYSICAL EXAM:  BP 126/78 (BP Location: Left Arm, Patient Position: Sitting)   Pulse 72   Temp 98 F (36.7 C) (Tympanic)   Resp 18   Wt 204 lb (92.5 kg)   BMI 32.93 kg/m  Well-developed well-nourished patient in NAD. HEENT reveals PERLA, EOMI, discs not visualized.  Oral cavity is clear. No oral mucosal lesions are identified. Neck is clear without evidence of cervical or supraclavicular adenopathy. Lungs are clear to A&P. Cardiac examination is essentially unremarkable with regular rate and rhythm without murmur rub or thrill. Abdomen is benign with no organomegaly or masses noted. Motor sensory and DTR levels are equal and symmetric in the upper and lower extremities. Cranial nerves II through XII are grossly intact. Proprioception is intact. No peripheral adenopathy or edema  is identified. No motor or sensory levels are noted. Crude visual fields are within normal range.  RADIOLOGY RESULTS: No current films for review  PLAN: Present time patient is under excellent biochemical control of his prostate cancer currently under treatment with intravesical gemcitabine for urothelial carcinoma.  I have asked to see him back in 1 year for follow-up.  Patient knows to call with any concerns.  I would like to take this opportunity to thank you for allowing me to participate in the care of your patient.[name], MD

## 2021-01-07 ENCOUNTER — Encounter: Payer: Self-pay | Admitting: Urology

## 2021-01-07 ENCOUNTER — Inpatient Hospital Stay: Payer: Medicare Other

## 2021-01-07 ENCOUNTER — Other Ambulatory Visit: Payer: Self-pay

## 2021-01-07 ENCOUNTER — Encounter: Payer: Self-pay | Admitting: Oncology

## 2021-01-07 ENCOUNTER — Inpatient Hospital Stay (HOSPITAL_BASED_OUTPATIENT_CLINIC_OR_DEPARTMENT_OTHER): Payer: Medicare Other | Admitting: Oncology

## 2021-01-07 VITALS — BP 111/71 | HR 59 | Temp 97.0°F | Resp 18 | Wt 200.8 lb

## 2021-01-07 VITALS — BP 157/79 | HR 72

## 2021-01-07 DIAGNOSIS — C674 Malignant neoplasm of posterior wall of bladder: Secondary | ICD-10-CM | POA: Diagnosis not present

## 2021-01-07 DIAGNOSIS — Z5111 Encounter for antineoplastic chemotherapy: Secondary | ICD-10-CM

## 2021-01-07 DIAGNOSIS — C679 Malignant neoplasm of bladder, unspecified: Secondary | ICD-10-CM

## 2021-01-07 LAB — URINALYSIS, COMPLETE (UACMP) WITH MICROSCOPIC
Bilirubin Urine: NEGATIVE
Glucose, UA: NEGATIVE mg/dL
Ketones, ur: NEGATIVE mg/dL
Nitrite: NEGATIVE
Protein, ur: 30 mg/dL — AB
Specific Gravity, Urine: 1.02 (ref 1.005–1.030)
WBC, UA: >50 WBC/hpf — ABNORMAL HIGH (ref 0–5)
pH: 5 (ref 5.0–8.0)

## 2021-01-07 LAB — BASIC METABOLIC PANEL
Anion gap: 9 (ref 5–15)
BUN: 22 mg/dL (ref 8–23)
CO2: 26 mmol/L (ref 22–32)
Calcium: 9.1 mg/dL (ref 8.9–10.3)
Chloride: 100 mmol/L (ref 98–111)
Creatinine, Ser: 1.12 mg/dL (ref 0.61–1.24)
GFR, Estimated: >60 mL/min (ref >60)
Glucose, Bld: 140 mg/dL — ABNORMAL HIGH (ref 70–99)
Potassium: 4.1 mmol/L (ref 3.5–5.1)
Sodium: 135 mmol/L (ref 135–145)

## 2021-01-07 LAB — CBC WITH DIFFERENTIAL/PLATELET
Abs Immature Granulocytes: 0.02 10*3/uL (ref 0.00–0.07)
Basophils Absolute: 0.1 10*3/uL (ref 0.0–0.1)
Basophils Relative: 1 %
Eosinophils Absolute: 0.1 10*3/uL (ref 0.0–0.5)
Eosinophils Relative: 2 %
HCT: 43.4 % (ref 39.0–52.0)
Hemoglobin: 14.9 g/dL (ref 13.0–17.0)
Immature Granulocytes: 0 %
Lymphocytes Relative: 19 %
Lymphs Abs: 1.1 10*3/uL (ref 0.7–4.0)
MCH: 35.5 pg — ABNORMAL HIGH (ref 26.0–34.0)
MCHC: 34.3 g/dL (ref 30.0–36.0)
MCV: 103.3 fL — ABNORMAL HIGH (ref 80.0–100.0)
Monocytes Absolute: 0.8 10*3/uL (ref 0.1–1.0)
Monocytes Relative: 13 %
Neutro Abs: 3.7 10*3/uL (ref 1.7–7.7)
Neutrophils Relative %: 65 %
Platelets: 238 10*3/uL (ref 150–400)
RBC: 4.2 MIL/uL — ABNORMAL LOW (ref 4.22–5.81)
RDW: 15.6 % — ABNORMAL HIGH (ref 11.5–15.5)
WBC: 5.8 10*3/uL (ref 4.0–10.5)
nRBC: 0 % (ref 0.0–0.2)

## 2021-01-07 MED ORDER — PROCHLORPERAZINE MALEATE 10 MG PO TABS
10.0000 mg | ORAL_TABLET | Freq: Once | ORAL | Status: AC
  Administered 2021-01-07: 10 mg via ORAL
  Filled 2021-01-07: qty 1

## 2021-01-07 MED ORDER — GEMCITABINE CHEMO FOR BLADDER INSTILLATION 2000 MG
2000.0000 mg | Freq: Once | INTRAVENOUS | Status: AC
  Administered 2021-01-07: 2000 mg via INTRAVESICAL
  Filled 2021-01-07: qty 52.6

## 2021-01-07 MED ORDER — BELLADONNA ALKALOIDS-OPIUM 16.2-60 MG RE SUPP
1.0000 | Freq: Once | RECTAL | Status: AC | PRN
  Administered 2021-01-07: 1 via RECTAL
  Filled 2021-01-07: qty 1

## 2021-01-07 MED ORDER — LORAZEPAM 2 MG/ML IJ SOLN
0.5000 mg | Freq: Once | INTRAMUSCULAR | Status: AC
  Administered 2021-01-07: 0.5 mg via INTRAVENOUS
  Filled 2021-01-07: qty 1

## 2021-01-07 NOTE — Progress Notes (Signed)
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  Patient received 6 weekly courses of intravesical gemcitabine which she completed in August 2017.He has been undergoing surveillance cystoscopy with Dr. Bernardo Heater and underwent repeat cystoscopy which showed new tumor in the posterior wall of the bladder.  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  There was a small focus of high-grade histology seen comprising less than 5% of the tumor.  Muscularis propria was present in the specimen and uninvolved.  Patient completed 6 weekly cycles of intravesical gemcitabine chemotherapy on 01/07/2021.      Interval history-patient states that after he received B&O suppository as well as 2 doses of Ativan with last treatment he could not urinate for about 3 hours.  He continues to have chronic issues with urinary urgency which has remained unchanged.  ECOG PS- 1 Pain scale- 0   Review of systems- Review of Systems  Constitutional:  Negative for chills, fever, malaise/fatigue and weight loss.  HENT:  Negative for congestion, ear discharge and nosebleeds.   Eyes:  Negative for blurred vision.  Respiratory:  Negative for cough, hemoptysis, sputum production, shortness of breath and wheezing.   Cardiovascular:  Negative for chest  pain, palpitations, orthopnea and claudication.  Gastrointestinal:  Negative for abdominal pain, blood in stool, constipation, diarrhea, heartburn, melena, nausea and vomiting.  Genitourinary:  Positive for urgency. Negative for dysuria, flank pain, frequency and hematuria.  Musculoskeletal:  Negative for back pain, joint pain and myalgias.  Skin:  Negative for rash.  Neurological:  Negative for dizziness, tingling, focal weakness, seizures, weakness and headaches.  Endo/Heme/Allergies:  Does not bruise/bleed easily.  Psychiatric/Behavioral:  Negative for depression and suicidal ideas. The patient does not have insomnia.      Allergies  Allergen Reactions   Cidex [Glutaral] Anaphylaxis     Past Medical History:  Diagnosis Date   Bladder cancer ([place])    Coronary artery disease    a.) LHC 04/12/2002 --> LVEF 42%; severe 3v CAD. 4v CABG on 04/13/2002. b.) LHC 02/03/2013 --> LVEF 50%; severe 3v CAD with patent LIMA-LAD and SVG-OM1 grafts; SVG-PDA and SVG-DG1 occluded with collaterals to PDA and DG1 from LAD.   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 ENDOSCOPY;   Service: Gastroenterology;  Laterality: N/A;   CORONARY ANGIOPLASTY     CORONARY ARTERY BYPASS GRAFT N/A 04/13/2002   Procedure: 4v CABG (LIMA-LAD, SVG-OM1, SVG-D1, SVG-PDA); Location: [facility]; Surgeon: Corinna Capra, MD   TONSILLECTOMY     TRANSURETHRAL RESECTION OF BLADDER TUMOR N/A 11/13/2020   Procedure: TRANSURETHRAL RESECTION OF BLADDER TUMOR (TURBT);  Surgeon: Abbie Sons, MD;  Location: [facility] ORS;  Service: Urology;  Laterality: N/A;   TRANSURETHRAL RESECTION OF PROSTATE      Social History   Socioeconomic History   Marital status: Married    Spouse name: Not on file   Number of children: Not on file   Years of education: Not on file   Highest education level: Not on file  Occupational History   Not on file  Tobacco Use   Smoking status: Former    Packs/day: 0.75    Years: 60.00    Pack years: 45.00    Types: Cigarettes    Quit date: 2008    Years since quitting: 14.8   Smokeless tobacco: Never  Substance and Sexual Activity   Alcohol use: Yes    Alcohol/week: 2.0 standard drinks    Types: 2 Standard drinks or equivalent per week   Drug use: No   Sexual activity: Not on file  Other Topics Concern   Not on file  Social History Narrative   Not on file   Social Determinants of Health   Financial Resource Strain: Not on file  Food Insecurity: Not on file  Transportation Needs: Not on file  Physical Activity: Not on file  Stress: Not on file  Social Connections: Not on file  Intimate Partner Violence: Not on file    Family History  Problem Relation Age of Onset   Prostate cancer Father        Metastatic   Cancer Father      Current Outpatient Medications:    aspirin 81 MG tablet, Take 81 mg by mouth daily., Disp: , Rfl:    atorvastatin (LIPITOR) 10 MG tablet, Take 10 mg by mouth daily., Disp: , Rfl:    b complex vitamins capsule, Take 1 capsule by mouth daily., Disp: , Rfl:    cetirizine (ZYRTEC) 10 MG tablet, Take 10 mg by mouth at bedtime., Disp: , Rfl:     Cholecalciferol (VITAMIN D3 PO), Take 1,000 Units by mouth daily., Disp: , Rfl:    Coenzyme Q10 100 MG capsule, Take 100 mg by mouth daily. , Disp: , Rfl:    fluticasone (FLONASE) 50 MCG/ACT nasal spray, Place 1 spray into both nostrils at bedtime., Disp: , Rfl:    GLUCOSAMINE-CHONDROITIN PO, Take 1 tablet by mouth 2 (two) times daily., Disp: , Rfl:    hyoscyamine (LEVSIN SL) 0.125 MG SL tablet, 2 tabs dissolved under tongue just prior to catheter placement, Disp: 10 tablet, Rfl: 0   isosorbide mononitrate (IMDUR) 30 MG 24 hr tablet, Take 30 mg by mouth every morning., Disp: , Rfl:    Magnesium 200 MG TABS, Take 200 mg by mouth 2 (two) times daily., Disp: , Rfl:    metoprolol tartrate (LOPRESSOR) 25 MG tablet, Take 25 mg by mouth 2 (two) times daily., Disp: , Rfl:    metroNIDAZOLE (METROGEL) 0.75 % gel, Apply 1 application topically 2 (two) times daily., Disp: , Rfl:    Multiple  Vitamin (MULTIVITAMIN) tablet, Take 1 tablet by mouth daily., Disp: , Rfl:    naproxen sodium (ANAPROX) 220 MG tablet, Take 220 mg by mouth daily as needed (pain)., Disp: , Rfl:    Omega-3 Fatty Acids (FISH OIL) 1000 MG CAPS, Take 1,000 mg by mouth daily., Disp: , Rfl:    oxybutynin (DITROPAN) 5 MG tablet, 1 tab p.o. 1 hour prior to bladder treatment, Disp: 5 tablet, Rfl: 0   Potassium Gluconate 595 MG CAPS, Take 1 capsule by mouth daily as needed (After HT TR exercise)., Disp: , Rfl:    Probiotic Product (MEGA PROBIOTIC) CAPS, Take 1 capsule by mouth daily., Disp: , Rfl:    tamsulosin (FLOMAX) 0.4 MG CAPS capsule, Take 1 capsule (0.4 mg total) by mouth daily., Disp: 30 capsule, Rfl: 3   triamterene-hydrochlorothiazide (MAXZIDE-25) 37.5-25 MG tablet, Take 1 tablet by mouth daily., Disp: , Rfl:    Vitamin A 2400 MCG (8000 UT) CAPS, Take 8,000 Units by mouth daily., Disp: , Rfl:    vitamin B-12 (CYANOCOBALAMIN) 1000 MCG tablet, Take 1,000 mcg by mouth daily., Disp: , Rfl:    vitamin C (ASCORBIC ACID) 250 MG tablet, Take 250  mg by mouth 2 (two) times daily., Disp: , Rfl:    zinc gluconate 50 MG tablet, Take 50 mg by mouth daily., Disp: , Rfl:    Alpha Lipoic Acid 200 MG CAPS, Take 200 mg by mouth daily. (Patient not taking: Reported on 01/07/2021), Disp: , Rfl:    calcium & magnesium carbonates (MYLANTA) 311-232 MG tablet, Take 1 tablet by mouth daily as needed for heartburn. (Patient not taking: Reported on 01/07/2021), Disp: , Rfl:    nitroGLYCERIN (NITROSTAT) 0.4 MG SL tablet, Place 0.4 mg under the tongue every 5 (five) minutes as needed for chest pain. (Patient not taking: Reported on 11/21/2020), Disp: , Rfl:   Physical exam:  Vitals:   01/07/21 0840  BP: 111/71  Pulse: (!) 59  Resp: 18  Temp: (!) 97 F (36.1 C)  SpO2: 97%  Weight: 200 lb 12.8 oz (91.1 kg)   Physical Exam Constitutional:      General: He is not in acute distress. Cardiovascular:     Rate and Rhythm: Normal rate and regular rhythm.     Heart sounds: Normal heart sounds.  Pulmonary:     Effort: Pulmonary effort is normal.     Breath sounds: Normal breath sounds.  Skin:    General: Skin is warm and dry.  Neurological:     Mental Status: He is alert and oriented to person, place, and time.     CMP Latest Ref Rng & Units 01/07/2021  Glucose 70 - 99 mg/dL 140(H)  BUN 8 - 23 mg/dL 22  Creatinine 0.61 - 1.24 mg/dL 1.12  Sodium 135 - 145 mmol/L 135  Potassium 3.5 - 5.1 mmol/L 4.1  Chloride 98 - 111 mmol/L 100  CO2 22 - 32 mmol/L 26  Calcium 8.9 - 10.3 mg/dL 9.1   CBC Latest Ref Rng & Units 01/07/2021  WBC 4.0 - 10.5 K/uL 5.8  Hemoglobin 13.0 - 17.0 g/dL 14.9  Hematocrit 39.0 - 52.0 % 43.4  Platelets 150 - 400 K/uL 238    Assessment and plan- Patient is a 75 y.o. male with superficial bladder cancer here for on treatment assessment prior to cycle 6 of weekly intravesical gemcitabine chemotherapy  Counts are overall okay to proceed with cycle 6 of weekly gemcitabine intravesical chemotherapy today which will be his last  treatment.  He does not require a follow-up with me in the future.  With regards to his symptoms of urinary urgency I have asked him to follow-up with Dr. Bernardo Heater and his team closely to see if his medications need to be tweaked.  He can be referred to [place] in the future if questions or concerns arise   Visit Diagnosis 1. Encounter for antineoplastic chemotherapy   2. Malignant neoplasm of posterior wall of urinary bladder (HCC)      Dr. Randa Evens, MD, MPH [facility] at [facility] [phone] 01/07/2021 12:18 PM

## 2021-01-07 NOTE — Progress Notes (Signed)
Pt states that the TRBT has been interferring with his tx; has a urgency and frequncy urination issue which he was aware of: will last about 5-6 weeks. States that when sitting he urinates every hour, when actively oving he has to go every 20 minutes. He will like to discuss taking Mirabegron.

## 2021-01-07 NOTE — Patient Instructions (Signed)
[facility] [place] [facility]  Discharge Instructions: Thank you for choosing [facility] to provide your oncology and hematology care.  If you have a lab appointment with the Cancer Center, please go directly to the Cancer Center and check in at the registration area.  Wear comfortable clothing and clothing appropriate for easy access to any Portacath or PICC line.   We strive to give you quality time with your provider. You may need to reschedule your appointment if you arrive late (15 or more minutes).  Arriving late affects you and other patients whose appointments are after yours.  Also, if you miss three or more appointments without notifying the office, you may be dismissed from the clinic at the provider's discretion.      For prescription refill requests, have your pharmacy contact our office and allow 72 hours for refills to be completed.    Today you received the following chemotherapy and/or immunotherapy agents       To help prevent nausea and vomiting after your treatment, we encourage you to take your nausea medication as directed.  BELOW ARE SYMPTOMS THAT SHOULD BE REPORTED IMMEDIATELY: *FEVER GREATER THAN 100.4 F (38 C) OR HIGHER *CHILLS OR SWEATING *NAUSEA AND VOMITING THAT IS NOT CONTROLLED WITH YOUR NAUSEA MEDICATION *UNUSUAL SHORTNESS OF BREATH *UNUSUAL BRUISING OR BLEEDING *URINARY PROBLEMS (pain or burning when urinating, or frequent urination) *BOWEL PROBLEMS (unusual diarrhea, constipation, pain near the anus) TENDERNESS IN MOUTH AND THROAT WITH OR WITHOUT PRESENCE OF ULCERS (sore throat, sores in mouth, or a toothache) UNUSUAL RASH, SWELLING OR PAIN  UNUSUAL VAGINAL DISCHARGE OR ITCHING   Items with * indicate a potential emergency and should be followed up as soon as possible or go to the Emergency Department if any problems should occur.  Please show the CHEMOTHERAPY ALERT CARD or IMMUNOTHERAPY ALERT CARD at check-in to the  Emergency Department and triage nurse.  Should you have questions after your visit or need to cancel or reschedule your appointment, please contact [facility] [place] [facility]  [phone] and follow the prompts.  Office hours are 8:00 a.m. to 4:30 p.m. Monday - Friday. Please note that voicemails left after 4:00 p.m. may not be returned until the following business day.  We are closed weekends and major holidays. You have access to a nurse at all times for urgent questions. Please call the main number to the clinic [phone] and follow the prompts.  For any non-urgent questions, you may also contact your provider using MyChart. We now offer e-Visits for anyone 18 and older to request care online for non-urgent symptoms. For details visit [email or web address].   Also download the MyChart app! Go to the app store, search "MyChart", open the app, select [facility], and log in with your MyChart username and password.  Due to Covid, a mask is required upon entering the hospital/clinic. If you do not have a mask, one will be given to you upon arrival. For doctor visits, patients may have 1 support person aged 18 or older with them. For treatment visits, patients cannot have anyone with them due to current Covid guidelines and our immunocompromised population.  

## 2021-01-08 ENCOUNTER — Encounter: Payer: Self-pay | Admitting: Urology

## 2021-01-13 ENCOUNTER — Encounter: Payer: Self-pay | Admitting: Urology

## 2021-01-13 ENCOUNTER — Encounter: Payer: Self-pay | Admitting: Oncology

## 2021-01-14 NOTE — Telephone Encounter (Signed)
Pt would like to come by and pick up samples of Myrbetriq 50 mg this Wednesday afternoon.  He would like for you to send him a mychart message about any potential problems the meds might cause prior to starting.

## 2021-01-17 ENCOUNTER — Encounter: Payer: Self-pay | Admitting: Urology

## 2021-02-14 ENCOUNTER — Encounter: Payer: Self-pay | Admitting: Urology

## 2021-02-14 ENCOUNTER — Encounter: Payer: Self-pay | Admitting: Family Medicine

## 2021-02-28 ENCOUNTER — Ambulatory Visit (INDEPENDENT_AMBULATORY_CARE_PROVIDER_SITE_OTHER): Payer: Medicare Other | Admitting: Urology

## 2021-02-28 ENCOUNTER — Encounter: Payer: Self-pay | Admitting: Urology

## 2021-02-28 ENCOUNTER — Other Ambulatory Visit: Payer: Self-pay

## 2021-02-28 VITALS — BP 147/74 | HR 87 | Ht 70.0 in | Wt 200.0 lb

## 2021-02-28 DIAGNOSIS — D494 Neoplasm of unspecified behavior of bladder: Secondary | ICD-10-CM

## 2021-02-28 DIAGNOSIS — C679 Malignant neoplasm of bladder, unspecified: Secondary | ICD-10-CM

## 2021-03-01 LAB — URINALYSIS, COMPLETE
Bilirubin, UA: NEGATIVE
Glucose, UA: NEGATIVE
Ketones, UA: NEGATIVE
Leukocytes,UA: NEGATIVE
Nitrite, UA: NEGATIVE
Specific Gravity, UA: 1.025 (ref 1.005–1.030)
Urobilinogen, Ur: 0.2 mg/dL (ref 0.2–1.0)
pH, UA: 6.5 (ref 5.0–7.5)

## 2021-03-01 LAB — MICROSCOPIC EXAMINATION: Bacteria, UA: NONE SEEN

## 2021-03-02 ENCOUNTER — Encounter: Payer: Self-pay | Admitting: Urology

## 2021-03-02 NOTE — Progress Notes (Signed)
° °  03/02/21  CC:  Chief Complaint  Patient presents with   Cysto    Urologic history: 1.  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  Intermediate risk adenocarcinoma prostate -Treated IMRT+ ADT -Last PSA 12/24/2020 0.03   HPI: Presents for for cystoscopy post gemcitabine completion.  Fairly significant storage related voiding symptoms during his treatment which have persisted  Blood pressure (!) 147/74, pulse 87, height 5\' 10"  (1.778 m), weight 200 lb (90.7 kg). NED. A&Ox3.   No respiratory distress   Abd soft, NT, ND Normal phallus with bilateral descended testicles  Cystoscopy Procedure Note  Patient identification was confirmed, informed consent was obtained, and patient was prepped using Betadine solution.  Lidocaine jelly was administered per urethral meatus.     Pre-Procedure: - Inspection reveals a normal caliber urethral meatus.  Procedure: The flexible cystoscope was introduced without difficulty - No urethral strictures/lesions are present. -Moderate lateral lobe enlargement prostate  - Elevated bladder neck - Bilateral ureteral orifices identified - Bladder mucosa  reveals no tumors or significant mucosal abnormalities.  Some residual exudate prior resection site without tumor - No bladder stones -Moderate trabeculation  Retroflexion shows no abnormalities   Post-Procedure: - Patient tolerated the procedure well  Assessment/ Plan: No evidence recurrent urothelial carcinoma Follow-up surveillance cystoscopy 3 months Persistent storage related voiding symptoms without significant improvement on Myrbetriq We are presently  out of Gemtesa samples however he will be notified once they are received for a trial Interested in an appointment with Dr. Matilde Sprang to discuss second line options including Botox   Abbie Sons, MD

## 2021-03-07 ENCOUNTER — Other Ambulatory Visit: Payer: Medicare Other | Admitting: Urology

## 2021-03-23 ENCOUNTER — Encounter: Payer: Self-pay | Admitting: Urology

## 2021-04-08 ENCOUNTER — Ambulatory Visit: Payer: Medicare Other | Admitting: Urology

## 2021-04-08 NOTE — Progress Notes (Incomplete)
04/08/21 2:11 PM   [name] September 13, 1945 [id number]  Referring provider:  Baxter Hire, MD [place],  [place] [address] No chief complaint on file.    HPI: [name] is a 76 y.o.male with a personal history of recurrent Ta urothelial carcinoma bladder and intermediate risk adenocarcinoma prostate, who presents for Botox discussed.   He is treated with IMRT + ADT his last PSA 12/31/2020   Ta Urothelial carcinoma trend: TURBT July 2011; low-grade Ta TURBT December 2014; low-grade Ta; postresection mitomycin TURBT 07/2015; low-grade Ta; intravesical gemcitabine x6 weeks TURBT 10/2020;Ta low-grade urothelial carcinoma with small focus suggestive of high-grade features; repeat intravesical gemcitabine 6 completed 01/07/2021        PMH: Past Medical History:  Diagnosis Date   Bladder cancer ([place])    Coronary artery disease    a.) LHC 04/12/2002 --> LVEF 42%; severe 3v CAD. 4v CABG on 04/13/2002. b.) LHC 02/03/2013 --> LVEF 50%; severe 3v CAD with patent LIMA-LAD and SVG-OM1 grafts; SVG-PDA and SVG-DG1 occluded with collaterals to PDA and DG1 from LAD.   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   a.) LIMA-LAD, SVG-OM1, SVG-D1, SVG-PDA   Tubular adenoma of colon 02/03/2007    Surgical History: Past Surgical History:  Procedure Laterality Date   CARDIAC CATHETERIZATION Left 04/13/2002   Procedure: CARDIAC CATHETERIZATION; Location: [place]; Surgeon; Isaias Cowman, MD   CARDIAC CATHETERIZATION Left 02/03/2013   Procedure: CARDIAC CATHETERIZATION; Location: [place]; Surgeon: Isaias Cowman, MD   COLONOSCOPY W/ POLYPECTOMY     COLONOSCOPY WITH PROPOFOL N/A  04/09/2015   Procedure: COLONOSCOPY WITH PROPOFOL;  Surgeon: Hulen Luster, MD;  Location: [facility] ENDOSCOPY;  Service: Gastroenterology;  Laterality: N/A;   CORONARY ANGIOPLASTY     CORONARY ARTERY BYPASS GRAFT N/A 04/13/2002   Procedure: 4v CABG (LIMA-LAD, SVG-OM1, SVG-D1, SVG-PDA); Location: [facility]; Surgeon: Corinna Capra, MD   TONSILLECTOMY     TRANSURETHRAL RESECTION OF BLADDER TUMOR N/A 11/13/2020   Procedure: TRANSURETHRAL RESECTION OF BLADDER TUMOR (TURBT);  Surgeon: Abbie Sons, MD;  Location: [facility] ORS;  Service: Urology;  Laterality: N/A;   TRANSURETHRAL RESECTION OF PROSTATE      Home Medications:  Allergies as of 04/09/2021       Reactions   Cidex [glutaral] Anaphylaxis        Medication List        Accurate as of April 08, 2021  2:11 PM. If you have any questions, ask your nurse or doctor.          aspirin 81 MG tablet Take 81 mg by mouth daily.   atorvastatin 10 MG tablet Commonly known as: LIPITOR Take 10 mg by mouth daily.   b complex vitamins capsule Take 1 capsule by mouth daily.   cetirizine 10 MG tablet Commonly known as: ZYRTEC Take 10 mg by mouth at bedtime.   Coenzyme Q10 100 MG capsule Take 100 mg by mouth daily.   Fish Oil 1000 MG Caps Take 1,000 mg by mouth daily.   fluticasone 50 MCG/ACT nasal spray Commonly known as: FLONASE Place 1 spray into both nostrils at bedtime.   GLUCOSAMINE-CHONDROITIN PO Take 1 tablet by  mouth 2 (two) times daily.   isosorbide mononitrate 30 MG 24 hr tablet Commonly known as: IMDUR Take 30 mg by mouth every morning.   Magnesium 200 MG Tabs Take 200 mg by mouth 2 (two) times daily.   Mega Probiotic Caps Take 1 capsule by mouth daily.   metoprolol tartrate 25 MG tablet Commonly known as: LOPRESSOR Take 25 mg by mouth 2 (two) times daily.   metroNIDAZOLE 0.75 % gel Commonly known as: METROGEL Apply 1 application topically 2 (two) times daily.   multivitamin tablet Take 1 tablet by mouth daily.    naproxen sodium 220 MG tablet Commonly known as: ALEVE Take 220 mg by mouth daily as needed (pain).   Potassium Gluconate 595 MG Caps Take 1 capsule by mouth daily as needed (After HT TR exercise).   tamsulosin 0.4 MG Caps capsule Commonly known as: FLOMAX Take 1 capsule (0.4 mg total) by mouth daily.   triamterene-hydrochlorothiazide 37.5-25 MG tablet Commonly known as: MAXZIDE-25 Take 1 tablet by mouth daily.   Vitamin A 2400 MCG (8000 UT) Caps Take 8,000 Units by mouth daily.   vitamin B-12 1000 MCG tablet Commonly known as: CYANOCOBALAMIN Take 1,000 mcg by mouth daily.   vitamin C 250 MG tablet Commonly known as: ASCORBIC ACID Take 250 mg by mouth 2 (two) times daily.   VITAMIN D3 PO Take 1,000 Units by mouth daily.   zinc gluconate 50 MG tablet Take 50 mg by mouth daily.        Allergies:  Allergies  Allergen Reactions   Cidex [Glutaral] Anaphylaxis    Family History: Family History  Problem Relation Age of Onset   Prostate cancer Father        Metastatic   Cancer Father     Social History:  reports that he quit smoking about 15 years ago. His smoking use included cigarettes. He has a 45.00 pack-year smoking history. He has never used smokeless tobacco. He reports current alcohol use of about 2.0 standard drinks per week. He reports that he does not use drugs.   Physical Exam: There were no vitals taken for this visit.  Constitutional:  Alert and oriented, No acute distress. HEENT: [place] AT, moist mucus membranes.  Trachea midline, no masses. Cardiovascular: No clubbing, cyanosis, or edema. Respiratory: Normal respiratory effort, no increased work of breathing. Skin: No rashes, bruises or suspicious lesions. Neurologic: Grossly intact, no focal deficits, moving all 4 extremities. Psychiatric: Normal mood and affect.  Laboratory Data: Lab Results  Component Value Date   CREATININE 1.12 01/07/2021    Urinalysis   Pertinent  Imaging:   Assessment & Plan:     No follow-ups on file.  I,Kailey Littlejohn,acting as a Education administrator for Hollice Espy, MD.,have documented all relevant documentation on the behalf of Hollice Espy, MD,as directed by  Hollice Espy, MD while in the presence of Hollice Espy, [place] [address] [place], [place] [address] [phone]

## 2021-04-09 ENCOUNTER — Ambulatory Visit (INDEPENDENT_AMBULATORY_CARE_PROVIDER_SITE_OTHER): Payer: Medicare Other | Admitting: Urology

## 2021-04-09 ENCOUNTER — Other Ambulatory Visit: Payer: Self-pay

## 2021-04-09 VITALS — BP 132/72 | HR 65 | Ht 66.0 in | Wt 206.0 lb

## 2021-04-09 DIAGNOSIS — C679 Malignant neoplasm of bladder, unspecified: Secondary | ICD-10-CM

## 2021-04-10 NOTE — Progress Notes (Signed)
Due to a scheduling error, the patient was inadvertently scheduled in my clinic instead of Dr. Matilde Sprang (complex voiding dysfunction).  Apologized to patient for miscommunication and error, rescheduled appropriately.  Hollice Espy, MD

## 2021-04-15 ENCOUNTER — Ambulatory Visit (INDEPENDENT_AMBULATORY_CARE_PROVIDER_SITE_OTHER): Payer: Medicare Other | Admitting: Urology

## 2021-04-15 ENCOUNTER — Other Ambulatory Visit: Payer: Self-pay

## 2021-04-15 ENCOUNTER — Encounter: Payer: Self-pay | Admitting: Urology

## 2021-04-15 VITALS — BP 135/63 | HR 69 | Ht 66.0 in | Wt 206.0 lb

## 2021-04-15 DIAGNOSIS — R350 Frequency of micturition: Secondary | ICD-10-CM

## 2021-04-15 NOTE — Progress Notes (Signed)
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  Cystoscopy February 28, 2021 normal. He failed Myrbetriq.  His last low-grade recurrence with bladder chemotherapy was in November 2022.   Patient voids every 1-2 hours worse when he is walking.  He gets up 3-4 times a night.  No ankle edema  He will wear a pad for confidence when he goes out in public but has very rare urge incontinence.  Some of the details were more challenging.  I do not feel he ever got Gemtesa samples.  He may have had oxybutynin after his bladder chemotherapy.  He thinks the chemotherapy has aggravated this frequency.  His PSA is doing very well       PMH: Past Medical History:  Diagnosis Date   Bladder cancer ([place])    Coronary artery disease    a.) LHC 04/12/2002 --> LVEF 42%; severe 3v CAD. 4v CABG on 04/13/2002. b.) LHC 02/03/2013 --> LVEF 50%; severe 3v CAD with patent LIMA-LAD and SVG-OM1 grafts; SVG-PDA and SVG-DG1 occluded with collaterals to PDA and DG1 from LAD.   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   a.) LIMA-LAD, SVG-OM1, SVG-D1, SVG-PDA   Tubular adenoma of colon 02/03/2007    Surgical History: Past Surgical History:  Procedure Laterality Date   CARDIAC CATHETERIZATION Left 04/13/2002   Procedure: CARDIAC CATHETERIZATION; Location: [facility]; Surgeon; Isaias Cowman, MD   CARDIAC CATHETERIZATION Left 02/03/2013    Procedure: CARDIAC CATHETERIZATION; Location: [facility]; Surgeon: Isaias Cowman, MD   COLONOSCOPY W/ POLYPECTOMY     COLONOSCOPY WITH PROPOFOL N/A 04/09/2015   Procedure: COLONOSCOPY WITH PROPOFOL;  Surgeon: Hulen Luster, MD;  Location: [facility] ENDOSCOPY;  Service: Gastroenterology;  Laterality: N/A;   CORONARY ANGIOPLASTY     CORONARY ARTERY BYPASS GRAFT N/A 04/13/2002   Procedure: 4v CABG (LIMA-LAD, SVG-OM1, SVG-D1, SVG-PDA); Location: [facility]; Surgeon: Corinna Capra, MD   TONSILLECTOMY     TRANSURETHRAL RESECTION OF BLADDER TUMOR N/A 11/13/2020   Procedure: TRANSURETHRAL RESECTION OF BLADDER TUMOR (TURBT);  Surgeon: Abbie Sons, MD;  Location: [facility] ORS;  Service: Urology;  Laterality: N/A;   TRANSURETHRAL RESECTION OF PROSTATE      Home Medications:  Allergies as of 04/15/2021       Reactions   Cidex [glutaral] Anaphylaxis        Medication List        Accurate as of April 15, 2021 11:14 AM. If you have any questions, ask your nurse or doctor.          STOP taking these medications    tamsulosin 0.4 MG Caps capsule Commonly known as: FLOMAX Stopped by: Reece Packer, MD       TAKE these medications    aspirin 81 MG tablet Take 81 mg by mouth daily.   atorvastatin 10 MG tablet Commonly known as: LIPITOR Take 1 tablet by mouth daily.   b  complex vitamins capsule Take 1 capsule by mouth daily.   cetirizine 10 MG tablet Commonly known as: ZYRTEC Take 10 mg by mouth at bedtime.   Coenzyme Q10 100 MG capsule Take 100 mg by mouth daily.   Fish Oil 1000 MG Caps Take 1,000 mg by mouth daily.   fluticasone 50 MCG/ACT nasal spray Commonly known as: FLONASE Place 1 spray into both nostrils at bedtime.   GLUCOSAMINE-CHONDROITIN PO Take 1 tablet by mouth 2 (two) times daily.   isosorbide mononitrate 30 MG 24 hr tablet Commonly known as: IMDUR Take 30 mg by mouth every morning.   Magnesium 200 MG Tabs Take 200 mg by mouth 2 (two) times daily.   Mega  Probiotic Caps Take 1 capsule by mouth daily.   metoprolol tartrate 25 MG tablet Commonly known as: LOPRESSOR Take 25 mg by mouth 2 (two) times daily.   metroNIDAZOLE 0.75 % gel Commonly known as: METROGEL Apply 1 application topically 2 (two) times daily.   multivitamin tablet Take 1 tablet by mouth daily.   naproxen sodium 220 MG tablet Commonly known as: ALEVE Take 220 mg by mouth daily as needed (pain).   Potassium Gluconate 595 MG Caps Take 1 capsule by mouth daily as needed (After HT TR exercise).   triamterene-hydrochlorothiazide 37.5-25 MG tablet Commonly known as: MAXZIDE-25 Take 1 tablet by mouth daily.   Vitamin A 2400 MCG (8000 UT) Caps Take 8,000 Units by mouth daily.   vitamin B-12 1000 MCG tablet Commonly known as: CYANOCOBALAMIN Take 1,000 mcg by mouth daily.   vitamin C 250 MG tablet Commonly known as: ASCORBIC ACID Take 250 mg by mouth 2 (two) times daily.   VITAMIN D3 PO Take 1,000 Units by mouth daily.   zinc gluconate 50 MG tablet Take 50 mg by mouth daily.        Allergies:  Allergies  Allergen Reactions   Cidex [Glutaral] Anaphylaxis    Family History: Family History  Problem Relation Age of Onset   Prostate cancer Father        Metastatic   Cancer Father     Social History:  reports that he quit smoking about 15 years ago. His smoking use included cigarettes. He has a 45.00 pack-year smoking history. He has never used smokeless tobacco. He reports current alcohol use of about 2.0 standard drinks per week. He reports that he does not use drugs.  ROS:                                        Physical Exam: BP 135/63    Pulse 69    Ht 5\' 6"  (1.676 m)    Wt 93.4 kg    BMI 33.25 kg/m    Laboratory Data: Lab Results  Component Value Date   WBC 5.8 01/07/2021   HGB 14.9 01/07/2021   HCT 43.4 01/07/2021   MCV 103.3 (H) 01/07/2021   PLT 238 01/07/2021    Lab Results  Component Value Date   CREATININE  1.12 01/07/2021    No results found for: PSA  No results found for: TESTOSTERONE  No results found for: HGBA1C  Urinalysis    Component Value Date/Time   COLORURINE AMBER (A) 01/07/2021 0819   APPEARANCEUR Clear 02/28/2021 0852   LABSPEC 1.020 01/07/2021 0819   PHURINE 5.0 01/07/2021 0819   GLUCOSEU Negative 02/28/2021 0852   HGBUR SMALL (A) 01/07/2021  House Negative 02/28/2021 [place] 01/07/2021 0819   PROTEINUR 2+ (A) 02/28/2021 0852   PROTEINUR 30 (A) 01/07/2021 0819   NITRITE Negative 02/28/2021 0852   NITRITE NEGATIVE 01/07/2021 0819   LEUKOCYTESUR Negative 02/28/2021 0852   LEUKOCYTESUR TRACE (A) 01/07/2021 0819    Pertinent Imaging:   Assessment & Plan: Patient has an overactive bladder.  He should not have Botox in the face of recurrent bladder cancer.  Percutaneous tibial nerve stimulation an option.  Based upon severity of symptoms I do not think InterStim would be appropriate to offer.  I will have him follow-up in 6 weeks on Gemtesa samples.  Overall I think he is doing quite well in the face of having radiation and bladder chemotherapy.  Patient will come back for cystoscopy and report on the Gemtesa by Dr. Bernardo Heater and he can always order percutaneous tibial nerve stimulation.  There is no question his urgency is improving and he should not have Botox or InterStim.   There are no diagnoses linked to this encounter.  No follow-ups on file.  Reece Packer, MD  [facility] [address], [place], [place] [address] [phone]

## 2021-04-17 ENCOUNTER — Encounter: Payer: Self-pay | Admitting: Urology

## 2021-04-18 ENCOUNTER — Telehealth: Payer: Self-pay | Admitting: Urology

## 2021-05-13 ENCOUNTER — Ambulatory Visit: Payer: Medicare Other | Admitting: Urology

## 2021-06-28 ENCOUNTER — Encounter: Payer: Self-pay | Admitting: Urology

## 2021-07-02 ENCOUNTER — Encounter: Payer: Self-pay | Admitting: Urology

## 2021-07-04 ENCOUNTER — Encounter: Payer: Self-pay | Admitting: Urology

## 2021-07-04 ENCOUNTER — Other Ambulatory Visit: Payer: Self-pay | Admitting: *Deleted

## 2021-07-04 ENCOUNTER — Other Ambulatory Visit: Payer: Medicare Other

## 2021-07-04 DIAGNOSIS — R350 Frequency of micturition: Secondary | ICD-10-CM

## 2021-07-04 LAB — URINALYSIS, COMPLETE
Bilirubin, UA: NEGATIVE
Glucose, UA: NEGATIVE
Leukocytes,UA: NEGATIVE
Nitrite, UA: NEGATIVE
Specific Gravity, UA: 1.02 (ref 1.005–1.030)
Urobilinogen, Ur: 1 mg/dL (ref 0.2–1.0)
pH, UA: 6 (ref 5.0–7.5)

## 2021-07-04 LAB — MICROSCOPIC EXAMINATION
Bacteria, UA: NONE SEEN
RBC, Urine: >30 /hpf — AB (ref 0–2)

## 2021-07-05 ENCOUNTER — Encounter: Payer: Self-pay | Admitting: Urology

## 2021-07-08 ENCOUNTER — Encounter: Payer: Self-pay | Admitting: Urology

## 2021-07-10 LAB — CULTURE, URINE COMPREHENSIVE

## 2021-07-11 ENCOUNTER — Other Ambulatory Visit: Payer: Self-pay | Admitting: *Deleted

## 2021-07-11 ENCOUNTER — Telehealth: Payer: Self-pay | Admitting: *Deleted

## 2021-07-11 ENCOUNTER — Encounter: Payer: Self-pay | Admitting: *Deleted

## 2021-07-11 MED ORDER — CEFUROXIME AXETIL 250 MG PO TABS: 250.0000 mg | ORAL_TABLET | Freq: Two times a day (BID) | ORAL | 0 refills | Status: AC

## 2021-07-11 NOTE — Telephone Encounter (Signed)
-----   Message from Abbie Sons, MD sent at 07/11/2021 12:39 PM EDT ----- Urine culture showed a low level of bacteria.  Please send in Rx cefuroxime 250 mg twice daily x5 days.  Have him start at least a dose tonight and in a.m. prior to cystoscopy

## 2021-07-11 NOTE — Telephone Encounter (Signed)
Notified patient wife as instructed, patient pleased. Discussed follow-up appointments, patient agrees  

## 2021-07-12 ENCOUNTER — Encounter: Payer: Self-pay | Admitting: Urology

## 2021-07-12 ENCOUNTER — Ambulatory Visit (INDEPENDENT_AMBULATORY_CARE_PROVIDER_SITE_OTHER): Payer: Medicare Other | Admitting: Urology

## 2021-07-12 VITALS — BP 128/66 | HR 67 | Ht 66.0 in | Wt 206.0 lb

## 2021-07-12 DIAGNOSIS — R310 Gross hematuria: Secondary | ICD-10-CM

## 2021-07-12 DIAGNOSIS — Z8551 Personal history of malignant neoplasm of bladder: Secondary | ICD-10-CM

## 2021-07-13 LAB — HEMATOCRIT: Hematocrit: 46.2 % (ref 37.5–51.0)

## 2021-07-14 ENCOUNTER — Other Ambulatory Visit: Payer: Self-pay | Admitting: Urology

## 2021-07-14 DIAGNOSIS — Z8551 Personal history of malignant neoplasm of bladder: Secondary | ICD-10-CM

## 2021-07-14 DIAGNOSIS — R310 Gross hematuria: Secondary | ICD-10-CM

## 2021-07-14 NOTE — Progress Notes (Signed)
   07/14/21  CC:  Chief Complaint  Patient presents with   Cysto    Urologic history: 1.  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  Intermediate risk adenocarcinoma prostate -Treated IMRT+ ADT -Last PSA 12/24/2020 0.03   HPI: Developed recurrent gross hematuria approximately 2 weeks ago after increasing exercise regimen (sit ups and leg lifts).  UA was significant microhematuria.  Urine culture grew 5000 colonies of E. coli.  Felt this most likely represents colonization and he was started on cefuroxime yesterday in anticipation of cystoscopy which was moved up from June  Blood pressure (!) 147/74, pulse 87, height '5\' 10"'$  (1.778 m), weight 200 lb (90.7 kg). NED. A&Ox3.   No respiratory distress   Abd soft, NT, ND Normal phallus with bilateral descended testicles  Cystoscopy Procedure Note  Patient identification was confirmed, informed consent was obtained, and patient was prepped using Betadine solution.  Lidocaine jelly was administered per urethral meatus.     Pre-Procedure: - Inspection reveals a normal caliber urethral meatus.  Procedure: The flexible cystoscope was introduced without difficulty - No urethral strictures/lesions are present. -Moderate lateral lobe enlargement prostate  - Elevated bladder neck - Suboptimal visualization due to hematuria which was mild though enough to affect adequate endoscopy.  No gross tumor was noted.  Retroflexion shows no gross abnormalities   Post-Procedure: - Patient tolerated the procedure well  Assessment/ Plan: Source of hematuria not identified on today's cystoscopy due to suboptimal  visualization Recommend cystoscopy under anesthesia with possible bladder biopsy, fulguration or TURBT if tumor identified.  The procedure was discussed and he desires to proceed    Abbie Sons, MD

## 2021-07-14 NOTE — H&P (View-Only) (Signed)
   07/14/21  CC:  Chief Complaint  Patient presents with   Cysto    Urologic history: 1.  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  Intermediate risk adenocarcinoma prostate -Treated IMRT+ ADT -Last PSA 12/24/2020 0.03   HPI: Developed recurrent gross hematuria approximately 2 weeks ago after increasing exercise regimen (sit ups and leg lifts).  UA was significant microhematuria.  Urine culture grew 5000 colonies of E. coli.  Felt this most likely represents colonization and he was started on cefuroxime yesterday in anticipation of cystoscopy which was moved up from June  Blood pressure (!) 147/74, pulse 87, height '5\' 10"'$  (1.778 m), weight 200 lb (90.7 kg). NED. A&Ox3.   No respiratory distress   Abd soft, NT, ND Normal phallus with bilateral descended testicles  Cystoscopy Procedure Note  Patient identification was confirmed, informed consent was obtained, and patient was prepped using Betadine solution.  Lidocaine jelly was administered per urethral meatus.     Pre-Procedure: - Inspection reveals a normal caliber urethral meatus.  Procedure: The flexible cystoscope was introduced without difficulty - No urethral strictures/lesions are present. -Moderate lateral lobe enlargement prostate  - Elevated bladder neck - Suboptimal visualization due to hematuria which was mild though enough to affect adequate endoscopy.  No gross tumor was noted.  Retroflexion shows no gross abnormalities   Post-Procedure: - Patient tolerated the procedure well  Assessment/ Plan: Source of hematuria not identified on today's cystoscopy due to suboptimal  visualization Recommend cystoscopy under anesthesia with possible bladder biopsy, fulguration or TURBT if tumor identified.  The procedure was discussed and he desires to proceed    Abbie Sons, MD

## 2021-07-14 NOTE — Progress Notes (Unsigned)
Surgical Physician Order Form [facility] [place]  * Scheduling expectation :  Any availability to add patient on this week?  *Length of Case: 30 minutes  *Clearance needed: no  *Anticoagulation Instructions: N/A  *Aspirin Instructions: Ok to continue Aspirin  *Post-op visit Date/Instructions:  1-2 week with pathology review  *Diagnosis:  Gross hematuria, history of bladder cancer  *Procedure:   Cystoscopy, possible bladder biopsy/TURBT/fulguration   Additional orders: N/A  -Admit type: OUTpatient  -Anesthesia: General  -VTE Prophylaxis Standing Order SCD's       Other:   -Standing Lab Orders Per Anesthesia    Lab other: None  -Standing Test orders EKG/Chest x-ray per Anesthesia       Test other:   - Medications:  Ancef 2gm IV  -Other orders:  N/A

## 2021-07-16 ENCOUNTER — Telehealth: Payer: Self-pay

## 2021-07-16 NOTE — Progress Notes (Signed)
[facility] Posting Form   Surgery Date/Time: Date: 07/17/2021  Surgeon: Dr. John Giovanni, MD  Surgery Location: Day Surgery  Inpt ( No  )   Outpt (Yes)   Obs ( No  )   Diagnosis: Gross Hematuria R31.0, History of Bladder Cancer Z85.51  -CPT: 52000, 16109,60454, 09811  Surgery: Cystoscopy with possible bladder biopsy, possible transurethral resection of Bladder Tumor and possible fulguration  Stop Anticoagulations: No  Cardiac/Medical/Pulmonary Clearance needed: no  *Orders entered into EPIC  Date: 07/16/21   *Case booked in EPIC  Date: 07/16/21  *Notified pt of Surgery: Date: 07/16/21  PRE-OP UA & CX: no  *Placed into Prior Authorization Work Que Date: 07/16/21   Assistant/laser/rep:No

## 2021-07-16 NOTE — Telephone Encounter (Signed)
I spoke with [name]. We have discussed possible surgery dates and Tuesday May 31st, 2023 was agreed upon by all parties. Patient given information about surgery date, what to expect pre-operatively and post operatively.  We discussed that a Pre-Admission Testing office will be calling to set up the pre-op visit that will take place prior to surgery, and that these appointments are typically done over the phone with a Pre-Admissions RN.  Informed patient that our office will communicate any additional care to be provided after surgery. Patients questions or concerns were discussed during our call. Advised to call our office should there be any additional information, questions or concerns that arise. Patient verbalized understanding.

## 2021-07-17 ENCOUNTER — Encounter: Payer: Self-pay | Admitting: Urology

## 2021-07-17 ENCOUNTER — Encounter
Admission: RE | Disposition: A | Discharge status: Home / Self Care | Payer: Self-pay | Source: Ambulatory Visit | Attending: Urology

## 2021-07-17 ENCOUNTER — Ambulatory Visit
Admission: RE | Admit: 2021-07-17 | Discharge: 2021-07-17 | Disposition: A | Discharge status: Home / Self Care | Payer: Medicare Other | Source: Ambulatory Visit | Attending: Urology | Admitting: Urology

## 2021-07-17 ENCOUNTER — Ambulatory Visit: Payer: Medicare Other | Admitting: Anesthesiology

## 2021-07-17 ENCOUNTER — Other Ambulatory Visit: Payer: Self-pay

## 2021-07-17 DIAGNOSIS — Z87891 Personal history of nicotine dependence: Secondary | ICD-10-CM | POA: Insufficient documentation

## 2021-07-17 DIAGNOSIS — I10 Essential (primary) hypertension: Secondary | ICD-10-CM | POA: Diagnosis not present

## 2021-07-17 DIAGNOSIS — I2510 Atherosclerotic heart disease of native coronary artery without angina pectoris: Secondary | ICD-10-CM | POA: Diagnosis not present

## 2021-07-17 DIAGNOSIS — Z951 Presence of aortocoronary bypass graft: Secondary | ICD-10-CM | POA: Insufficient documentation

## 2021-07-17 DIAGNOSIS — Z8546 Personal history of malignant neoplasm of prostate: Secondary | ICD-10-CM | POA: Insufficient documentation

## 2021-07-17 DIAGNOSIS — R310 Gross hematuria: Secondary | ICD-10-CM

## 2021-07-17 DIAGNOSIS — E669 Obesity, unspecified: Secondary | ICD-10-CM | POA: Diagnosis not present

## 2021-07-17 DIAGNOSIS — C679 Malignant neoplasm of bladder, unspecified: Secondary | ICD-10-CM | POA: Insufficient documentation

## 2021-07-17 DIAGNOSIS — Z8551 Personal history of malignant neoplasm of bladder: Secondary | ICD-10-CM

## 2021-07-17 DIAGNOSIS — C671 Malignant neoplasm of dome of bladder: Secondary | ICD-10-CM

## 2021-07-17 HISTORY — PX: CYSTOSCOPY WITH FULGERATION: SHX6638

## 2021-07-17 HISTORY — PX: CYSTOSCOPY WITH BIOPSY: SHX5122

## 2021-07-17 HISTORY — PX: CYSTOSCOPY: SHX5120

## 2021-07-17 SURGERY — CYSTOSCOPY
Anesthesia: General

## 2021-07-17 MED ORDER — ONDANSETRON HCL 4 MG/2ML IJ SOLN
INTRAMUSCULAR | Status: AC
  Filled 2021-07-17: qty 2

## 2021-07-17 MED ORDER — ONDANSETRON HCL 4 MG/2ML IJ SOLN
INTRAMUSCULAR | Status: DC | PRN
  Administered 2021-07-17: 4 mg via INTRAVENOUS

## 2021-07-17 MED ORDER — LACTATED RINGERS IV SOLN
INTRAVENOUS | Status: DC
Start: 2021-07-17 — End: 2021-07-17

## 2021-07-17 MED ORDER — OXYCODONE HCL 5 MG PO TABS: 5.0000 mg | ORAL_TABLET | Freq: Once | ORAL | Status: DC | PRN

## 2021-07-17 MED ORDER — DEXAMETHASONE SODIUM PHOSPHATE 10 MG/ML IJ SOLN
INTRAMUSCULAR | Status: AC
  Filled 2021-07-17: qty 1

## 2021-07-17 MED ORDER — LIDOCAINE HCL (PF) 2 % IJ SOLN
INTRAMUSCULAR | Status: AC
  Filled 2021-07-17: qty 5

## 2021-07-17 MED ORDER — DEXAMETHASONE SODIUM PHOSPHATE 10 MG/ML IJ SOLN
INTRAMUSCULAR | Status: DC | PRN
  Administered 2021-07-17: 10 mg via INTRAVENOUS

## 2021-07-17 MED ORDER — PROPOFOL 10 MG/ML IV BOLUS
INTRAVENOUS | Status: DC | PRN
  Administered 2021-07-17: 150 mg via INTRAVENOUS

## 2021-07-17 MED ORDER — ACETAMINOPHEN 10 MG/ML IV SOLN
INTRAVENOUS | Status: DC | PRN
  Administered 2021-07-17: 1000 mg via INTRAVENOUS

## 2021-07-17 MED ORDER — ACETAMINOPHEN 10 MG/ML IV SOLN: 1000.0000 mg | Freq: Once | INTRAVENOUS | Status: DC | PRN

## 2021-07-17 MED ORDER — PHENYLEPHRINE 80 MCG/ML (10ML) SYRINGE FOR IV PUSH (FOR BLOOD PRESSURE SUPPORT)
PREFILLED_SYRINGE | INTRAVENOUS | Status: DC | PRN
  Administered 2021-07-17 (×4): 80 ug via INTRAVENOUS
  Administered 2021-07-17: 160 ug via INTRAVENOUS

## 2021-07-17 MED ORDER — FENTANYL CITRATE (PF) 100 MCG/2ML IJ SOLN: 25.0000 ug | INTRAMUSCULAR | Status: DC | PRN

## 2021-07-17 MED ORDER — LIDOCAINE HCL (CARDIAC) PF 100 MG/5ML IV SOSY
PREFILLED_SYRINGE | INTRAVENOUS | Status: DC | PRN
  Administered 2021-07-17: 100 mg via INTRAVENOUS

## 2021-07-17 MED ORDER — FENTANYL CITRATE (PF) 100 MCG/2ML IJ SOLN
INTRAMUSCULAR | Status: AC
  Filled 2021-07-17: qty 2

## 2021-07-17 MED ORDER — LACTATED RINGERS IV SOLN: INTRAVENOUS | Status: DC | PRN

## 2021-07-17 MED ORDER — ORAL CARE MOUTH RINSE: 15.0000 mL | Freq: Once | OROMUCOSAL | Status: DC

## 2021-07-17 MED ORDER — CEFAZOLIN SODIUM-DEXTROSE 2-4 GM/100ML-% IV SOLN
INTRAVENOUS | Status: AC
  Filled 2021-07-17: qty 100

## 2021-07-17 MED ORDER — DEXMEDETOMIDINE (PRECEDEX) IN NS 20 MCG/5ML (4 MCG/ML) IV SYRINGE
PREFILLED_SYRINGE | INTRAVENOUS | Status: DC | PRN
  Administered 2021-07-17 (×2): 4 ug via INTRAVENOUS

## 2021-07-17 MED ORDER — EPHEDRINE SULFATE (PRESSORS) 50 MG/ML IJ SOLN
INTRAMUSCULAR | Status: DC | PRN
  Administered 2021-07-17: 10 mg via INTRAVENOUS

## 2021-07-17 MED ORDER — PROPOFOL 10 MG/ML IV BOLUS
INTRAVENOUS | Status: AC
  Filled 2021-07-17: qty 20

## 2021-07-17 MED ORDER — FENTANYL CITRATE (PF) 100 MCG/2ML IJ SOLN
INTRAMUSCULAR | Status: DC | PRN
  Administered 2021-07-17 (×2): 50 ug via INTRAVENOUS

## 2021-07-17 MED ORDER — ONDANSETRON HCL 4 MG/2ML IJ SOLN: 4.0000 mg | Freq: Once | INTRAMUSCULAR | Status: DC | PRN

## 2021-07-17 MED ORDER — CHLORHEXIDINE GLUCONATE 0.12 % MT SOLN: 15.0000 mL | Freq: Once | OROMUCOSAL | Status: DC

## 2021-07-17 MED ORDER — SUGAMMADEX SODIUM 200 MG/2ML IV SOLN
INTRAVENOUS | Status: DC | PRN
  Administered 2021-07-17: 200 mg via INTRAVENOUS

## 2021-07-17 MED ORDER — SODIUM CHLORIDE 0.9 % IR SOLN
Status: DC | PRN
  Administered 2021-07-17: 6000 mL via INTRAVESICAL

## 2021-07-17 MED ORDER — MIDAZOLAM HCL 2 MG/2ML IJ SOLN
INTRAMUSCULAR | Status: AC
  Filled 2021-07-17: qty 2

## 2021-07-17 MED ORDER — CHLORHEXIDINE GLUCONATE 0.12 % MT SOLN
OROMUCOSAL | Status: AC
  Filled 2021-07-17: qty 15

## 2021-07-17 MED ORDER — ROCURONIUM BROMIDE 100 MG/10ML IV SOLN
INTRAVENOUS | Status: DC | PRN
  Administered 2021-07-17: 50 mg via INTRAVENOUS

## 2021-07-17 MED ORDER — CEFAZOLIN SODIUM-DEXTROSE 2-4 GM/100ML-% IV SOLN
2.0000 g | INTRAVENOUS | Status: AC
  Administered 2021-07-17: 2 g via INTRAVENOUS

## 2021-07-17 MED ORDER — ACETAMINOPHEN 10 MG/ML IV SOLN
INTRAVENOUS | Status: AC
  Filled 2021-07-17: qty 100

## 2021-07-17 MED ORDER — OXYCODONE HCL 5 MG/5ML PO SOLN: 5.0000 mg | Freq: Once | ORAL | Status: DC | PRN

## 2021-07-17 SURGICAL SUPPLY — 39 items
BAG DRAIN CYSTO-URO LG1000N (MISCELLANEOUS) ×1 IMPLANT
BAG URINE DRAIN 2000ML AR STRL (UROLOGICAL SUPPLIES) ×1 IMPLANT
BRUSH SCRUB EZ 1% IODOPHOR (MISCELLANEOUS) ×1 IMPLANT
CATH FOLEY 2WAY 18X30 (CATHETERS) IMPLANT
CATH FOLEY 2WAY SIL 18X30 (CATHETERS)
CATH URET FLEX-TIP 2 LUMEN 10F (CATHETERS) ×2 IMPLANT
DRAPE UTILITY 15X26 TOWEL STRL (DRAPES) ×2 IMPLANT
DRSG TELFA 4X3 1S NADH ST (GAUZE/BANDAGES/DRESSINGS) ×2 IMPLANT
ELECT BIVAP BIPO 22/24 DONUT (ELECTROSURGICAL) ×2
ELECT LOOP 22F BIPOLAR SML (ELECTROSURGICAL) ×2
ELECT REM PT RETURN 9FT ADLT (ELECTROSURGICAL)
ELECTRD BIVAP BIPO 22/24 DONUT (ELECTROSURGICAL) IMPLANT
ELECTRODE LOOP 22F BIPOLAR SML (ELECTROSURGICAL) IMPLANT
ELECTRODE REM PT RTRN 9FT ADLT (ELECTROSURGICAL) ×1 IMPLANT
GAUZE 4X4 16PLY ~~LOC~~+RFID DBL (SPONGE) ×4 IMPLANT
GLOVE SURG UNDER POLY LF SZ7.5 (GLOVE) ×2 IMPLANT
GOWN STRL REUS W/ TWL LRG LVL3 (GOWN DISPOSABLE) ×2 IMPLANT
GOWN STRL REUS W/ TWL XL LVL3 (GOWN DISPOSABLE) ×1 IMPLANT
GOWN STRL REUS W/TWL LRG LVL3 (GOWN DISPOSABLE) ×2
GOWN STRL REUS W/TWL XL LVL3 (GOWN DISPOSABLE) ×1
GOWN STRL REUS W/TWL XL LVL4 (GOWN DISPOSABLE) ×2 IMPLANT
GUIDEWIRE STR DUAL SENSOR (WIRE) ×3 IMPLANT
IV NS IRRIG 3000ML ARTHROMATIC (IV SOLUTION) ×4 IMPLANT
KIT TURNOVER CYSTO (KITS) ×2 IMPLANT
LOOP CUT BIPOLAR 24F LRG (ELECTROSURGICAL) IMPLANT
MANIFOLD NEPTUNE II (INSTRUMENTS) ×1 IMPLANT
NDL SAFETY ECLIPSE 18X1.5 (NEEDLE) ×1 IMPLANT
NEEDLE HYPO 18GX1.5 SHARP (NEEDLE) ×1
PACK CYSTO AR (MISCELLANEOUS) ×2 IMPLANT
SET CYSTO W/LG BORE CLAMP LF (SET/KITS/TRAYS/PACK) ×2 IMPLANT
SET IRRIG Y TYPE TUR BLADDER L (SET/KITS/TRAYS/PACK) ×2 IMPLANT
SHEATH NAVIGATOR HD 12/14X36 (SHEATH) ×1 IMPLANT
SURGILUBE 2OZ TUBE FLIPTOP (MISCELLANEOUS) ×2 IMPLANT
SYR 10ML LL (SYRINGE) ×2 IMPLANT
SYR TOOMEY IRRIG 70ML (MISCELLANEOUS) ×2
SYRINGE TOOMEY IRRIG 70ML (MISCELLANEOUS) ×1 IMPLANT
WATER STERILE IRR 1000ML POUR (IV SOLUTION) ×2 IMPLANT
WATER STERILE IRR 3000ML UROMA (IV SOLUTION) ×2 IMPLANT
WATER STERILE IRR 500ML POUR (IV SOLUTION) ×2 IMPLANT

## 2021-07-17 NOTE — Discharge Instructions (Addendum)
Bladder Biopsy   General instructions:     Your recent bladder surgery requires very little post hospital care but some definite precautions.  Despite the fact that no skin incisions were used, the area around the bladder incisions are raw and covered with scabs to promote healing and prevent bleeding. Certain precautions are needed to insure that the scabs are not disturbed over the next 2-4 weeks while the healing proceeds.  Because the raw surface inside your bladder and the irritating effects of urine you may expect frequency of urination and/or urgency (a stronger desire to urinate) and perhaps even getting up at night more often. This will usually resolve or improve slowly over the healing period. You may see some blood in your urine over the first 6 weeks. Do not be alarmed, even if the urine was clear for a while. Get off your feet and drink lots of fluids until clearing occurs. If you start to pass clots or don't improve call us.  Diet:  You may return to your normal diet immediately. Because of the raw surface of your bladder, alcohol, spicy foods, foods high in acid and drinks with caffeine may cause irritation or frequency and should be used in moderation. To keep your urine flowing freely and avoid constipation, drink plenty of fluids during the day (8-10 glasses). Tip: Avoid cranberry juice because it is very acidic.  Activity:  Your physical activity doesn't need to be restricted. However, if you are very active, you may see some blood in the urine. We suggest that you reduce your activity under the circumstances until the bleeding has stopped.  Bowels:  It is important to keep your bowels regular during the postoperative period. Straining with bowel movements can cause bleeding. A bowel movement every other day is reasonable. Use a mild laxative if needed, such as milk of magnesia 2-3 tablespoons, or 2 Dulcolax tablets. Call if you continue to have problems. If you had been  taking narcotics for pain, before, during or after your surgery, you may be constipated. Take a laxative if necessary.    Medication:  You should resume your pre-surgery medications unless told not to. In addition you may be given an antibiotic to prevent or treat infection. Antibiotics are not always necessary. All medication should be taken as prescribed until the bottles are finished unless you are having an unusual reaction to one of the drugs.  AZO for burning obtain over-the-counter can be utilized for discomfort with urination.   [facility] [address], [place], [place] [address] [phone]  AMBULATORY SURGERY  DISCHARGE INSTRUCTIONS   The drugs that you were given will stay in your system until tomorrow so for the next 24 hours you should not:  Drive an automobile Make any legal decisions Drink any alcoholic beverage   You may resume regular meals tomorrow.  Today it is better to start with liquids and gradually work up to solid foods.  You may eat anything you prefer, but it is better to start with liquids, then soup and crackers, and gradually work up to solid foods.   Please notify your doctor immediately if you have any unusual bleeding, trouble breathing, redness and pain at the surgery site, drainage, fever, or pain not relieved by medication.    Additional Instructions:        Please contact your physician with any problems or Same Day Surgery at [phone], Monday through Friday 6 am to 4 pm, or [place] at [facility]  Main number at [phone].

## 2021-07-17 NOTE — Transfer of Care (Signed)
Immediate Anesthesia Transfer of Care Note  Patient: [name]  Procedure(s) Performed: CYSTOSCOPY CYSTOSCOPY WITH BIOPSY CYSTOSCOPY WITH FULGERATION  Patient Location: PACU  Anesthesia Type:General  Level of Consciousness: awake and patient cooperative  Airway & Oxygen Therapy: Patient Spontanous Breathing and Patient connected to face mask  Post-op Assessment: Report given to RN and Post -op Vital signs reviewed and stable  Post vital signs: Reviewed and stable  Last Vitals:  Vitals Value Taken Time  BP 141/71 07/17/21 0830  Temp 36.1 C 07/17/21 0830  Pulse 79 07/17/21 0838  Resp 12 07/17/21 0838  SpO2 98 % 07/17/21 0838  Vitals shown include unvalidated device data.  Last Pain:  Vitals:   07/17/21 0830  TempSrc:   PainSc: 0-No pain         Complications: No notable events documented.

## 2021-07-17 NOTE — Op Note (Signed)
Preoperative diagnosis:  Gross hematuria History urothelial carcinoma bladder History prostate cancer status post radiation therapy  Postoperative diagnosis:  Gross hematuria History urothelial carcinoma bladder History prostate cancer status post radiation therapy Bladder lesions   Procedure: Cystoscopy Bladder fulguration Bladder biopsies x3  Surgeon: Abbie Sons, MD  Anesthesia: General  Complications: None  Intraoperative findings:  Urethra normal in caliber without stricture Prostate nonocclusive with bladder neck elevation Bladder mucosa with scattered hypervascularity consistent with radiation change Oozing vessel bladder dome which was cauterized Lesions x3 dome of bladder ~ 5 mm in diameter which were edematous with early papillary change UOs normal-appearing with clear efflux  EBL: Minimal  Specimens: Bladder biopsies x3  Indication: [name] is a 76 y.o. patient a history of urothelial carcinoma the bladder and prostate cancer status post radiation who recently developed gross hematuria.  Office cystoscopy performed last week with suboptimal visualization.  After reviewing the management options for treatment, he elected to proceed with the above surgical procedure(s). We have discussed the potential benefits and risks of the procedure, side effects of the proposed treatment, the likelihood of the patient achieving the goals of the procedure, and any potential problems that might occur during the procedure or recuperation. Informed consent has been obtained.  Description of procedure:  The patient was taken to the operating room and general anesthesia was induced.  The patient was placed in the dorsal lithotomy position, prepped and draped in the usual sterile fashion, and preoperative antibiotics were administered. A preoperative time-out was performed.   A 21 French cystoscope was lubricated, passed per urethra and advanced into the bladder under  direct vision.  The bladder was emptied with medium blood-tinged urine and one small clot obtained.  There appeared to be blood coming from the bladder dome however rigid cystoscopy visualization was suboptimal.  The cystoscope was removed and a 24 French continuous-flow resectoscope sheath with visual obturator was lubricated, inserted per urethra and advanced proximally under direct vision.  An Iglesias resectoscope with button electrode was placed into the sheath.  Visualization was much improved with the continuous-flow resectoscope and findings were noted as described above.  The oozing vessel was cauterized.  The button electrode was exchanged for a small loop and the abnormal bladder mucosa was resected x3.  Biopsy sites were then fulgurated with the button electrode.  There is a prominent hypervascularity in the bladder dome which were not actively bleeding were also fulgurated.  At the completion procedure with the irrigant on low flow no bleeding was noted.  No other abnormal appearing mucosal lesions were identified.  The bladder was emptied and the resectoscope was removed.  A Foley catheter was not placed.  Plan: He will be discharged home Will contact with pathology results   Abbie Sons, M.D.

## 2021-07-17 NOTE — Anesthesia Preprocedure Evaluation (Addendum)
Anesthesia Evaluation  Patient identified by MRN, date of birth, ID band Patient awake    Reviewed: Allergy & Precautions, NPO status , Patient's Chart, lab work & pertinent test results  History of Anesthesia Complications Negative for: history of anesthetic complications  Airway Mallampati: IV   Neck ROM: Full    Dental  (+) Missing   Pulmonary former smoker (quit 2008),    Pulmonary exam normal breath sounds clear to auscultation       Cardiovascular hypertension, + CAD (s/p CABG 2004)  Normal cardiovascular exam Rhythm:Regular Rate:Normal  ECG 11/20/20: NSR, LBBB, LAFB, bifascicular block; unchanged from prior  Myocardial perfusion 09/22/18:  1. Gated ETT  2. Mild reduced left ventricular function  3. Inferior wall hypokinesis  4. Moderate inferior scar without evidence for ischemia   Neuro/Psych negative neurological ROS     GI/Hepatic negative GI ROS,   Endo/Other  Obesity   Renal/GU negative Renal ROS   Prostate CA; Bladder CA    Musculoskeletal   Abdominal   Peds  Hematology negative hematology ROS (+)   Anesthesia Other Findings Cardiology note 04/02/21:  76 year old gentleman with known coronary disease, status post CABG, without exertional chest pain. ETT sestamibi study 04/11/2016 revealed mildly reduced left ventricular function, with moderate inferior scar without ischemia, which was unchanged from previous study. The patient has essential hypertension, blood pressure low today on current BP medications. The patient has hyperlipidemia, with excellent control of LDL cholesterol on atorvastatin.  Plan   1. Continue current medications 2. Recommend low sodium diet 3. Continue atorvastatin for hyperlipidemia management  4. Heart healthy diet printed instructions given to the patient 5. Continue regular exercise 6. Return to clinic for follow-up in 6 months  No orders of the defined types were  placed in this encounter.  Return in about 6 months (around 09/30/2021).    Reproductive/Obstetrics                            Anesthesia Physical Anesthesia Plan  ASA: 3  Anesthesia Plan: General   Post-op Pain Management:    Induction: Intravenous  PONV Risk Score and Plan: 2 and Ondansetron, Dexamethasone and Treatment may vary due to age or medical condition  Airway Management Planned: Oral ETT  Additional Equipment:   Intra-op Plan:   Post-operative Plan: Extubation in OR  Informed Consent: I have reviewed the patients History and Physical, chart, labs and discussed the procedure including the risks, benefits and alternatives for the proposed anesthesia with the patient or authorized representative who has indicated his/her understanding and acceptance.     Dental advisory given  Plan Discussed with: CRNA  Anesthesia Plan Comments: (Patient consented for risks of anesthesia including but not limited to:  - adverse reactions to medications - damage to eyes, teeth, lips or other oral mucosa - nerve damage due to positioning  - sore throat or hoarseness - damage to heart, brain, nerves, lungs, other parts of body or loss of life  Informed patient about role of CRNA in peri- and intra-operative care.  Patient voiced understanding.)        Anesthesia Quick Evaluation

## 2021-07-17 NOTE — Interval H&P Note (Signed)
History and Physical Interval Note:  CV:RRR Lungs:clear  07/17/2021 7:09 AM  [name]  has presented today for surgery, with the diagnosis of Gross Hematuria, History of Bladder Cancer.  The various methods of treatment have been discussed with the patient and family. After consideration of risks, benefits and other options for treatment, the patient has consented to  Procedure(s): CYSTOSCOPY (N/A) CYSTOSCOPY WITH BIOPSY (N/A) TRANSURETHRAL RESECTION OF BLADDER TUMOR (TURBT) (N/A) CYSTOSCOPY WITH FULGERATION (N/A) as a surgical intervention.  The patient's history has been reviewed, patient examined, no change in status, stable for surgery.  I have reviewed the patient's chart and labs.  Questions were answered to the patient's satisfaction.     [place]

## 2021-07-17 NOTE — Anesthesia Procedure Notes (Signed)
Procedure Name: Intubation Date/Time: 07/17/2021 7:37 AM Performed by: Loletha Grayer, CRNA Pre-anesthesia Checklist: Patient identified, Patient being monitored, Timeout performed, Emergency Drugs available and Suction available Patient Re-evaluated:Patient Re-evaluated prior to induction Oxygen Delivery Method: Circle system utilized Preoxygenation: Pre-oxygenation with 100% oxygen Induction Type: IV induction Ventilation: Mask ventilation without difficulty and Oral airway inserted - appropriate to patient size Laryngoscope Size: 3 and McGraph Grade View: Grade I Tube type: Oral Tube size: 7.5 mm Number of attempts: 1 Airway Equipment and Method: Stylet Placement Confirmation: ETT inserted through vocal cords under direct vision, positive ETCO2 and breath sounds checked- equal and bilateral Secured at: 22 cm Tube secured with: Tape Dental Injury: Teeth and Oropharynx as per pre-operative assessment

## 2021-07-17 NOTE — Anesthesia Postprocedure Evaluation (Signed)
Anesthesia Post Note  Patient: [name]  Procedure(s) Performed: CYSTOSCOPY CYSTOSCOPY WITH BIOPSY CYSTOSCOPY WITH [place]  Patient location during evaluation: PACU Anesthesia Type: General Level of consciousness: awake and alert, oriented and patient cooperative Pain management: pain level controlled Vital Signs Assessment: post-procedure vital signs reviewed and stable Respiratory status: spontaneous breathing, nonlabored ventilation and respiratory function stable Cardiovascular status: blood pressure returned to baseline and stable Postop Assessment: adequate PO intake Anesthetic complications: no   No notable events documented.   Last Vitals:  Vitals:   07/17/21 0845 07/17/21 0906  BP: 135/69 (!) 150/73  Pulse: 74 75  Resp: 11 16  Temp: (!) 36.1 C (!) 36.3 C  SpO2: 94% 95%    Last Pain:  Vitals:   07/17/21 0906  TempSrc: Temporal  PainSc: 0-No pain                 Darrin Nipper

## 2021-07-18 ENCOUNTER — Encounter: Payer: Self-pay | Admitting: Urology

## 2021-07-18 ENCOUNTER — Other Ambulatory Visit: Payer: Medicare Other | Admitting: Urology

## 2021-07-18 LAB — CYTOLOGY - NON PAP

## 2021-07-18 LAB — SURGICAL PATHOLOGY

## 2021-07-19 ENCOUNTER — Encounter: Payer: Self-pay | Admitting: Urology

## 2021-07-25 ENCOUNTER — Encounter: Payer: Self-pay | Admitting: Urology

## 2021-07-25 ENCOUNTER — Ambulatory Visit (INDEPENDENT_AMBULATORY_CARE_PROVIDER_SITE_OTHER): Payer: Medicare Other | Admitting: Urology

## 2021-07-25 VITALS — BP 93/53 | HR 80 | Ht 66.0 in | Wt 205.0 lb

## 2021-07-25 DIAGNOSIS — C679 Malignant neoplasm of bladder, unspecified: Secondary | ICD-10-CM | POA: Diagnosis not present

## 2021-07-25 DIAGNOSIS — R3915 Urgency of urination: Secondary | ICD-10-CM | POA: Diagnosis not present

## 2021-07-25 DIAGNOSIS — R350 Frequency of micturition: Secondary | ICD-10-CM

## 2021-07-25 MED ORDER — GEMTESA 75 MG PO TABS: 75.0000 mg | ORAL_TABLET | Freq: Every day | ORAL | 0 refills | Status: DC

## 2021-07-25 NOTE — Progress Notes (Signed)
07/25/2021 12:58 PM   [name] Oct 29, 1945 [id number]  Referring provider: Baxter Hire, MD [place],  [place] [address]  Chief Complaint  Patient presents with   Follow-up    HPI: 76 y.o. male presents for postop follow-up.  Cystoscopy under anesthesia performed last week for persistent gross hematuria as office cystoscopy was suboptimal Intraoperative findings remarkable for oozing vessel in dome region and 3 areas of abnormal appearing bladder mucosa with early papillary change which were resected; all showing low-grade urothelial carcinoma the bladder Fairly significant frequency and urgency postoperatively though last night he noted significant improvement He has had 1 episode where he noted a trace amount of blood in the urine but otherwise no recurrent hematuria He had significant problems with pain and bladder spasms with previous 6-week course of gemcitabine.  Based on location of tumors we discussed possibility of poor contact of the chemotherapy to the area of recurrence   PMH: Past Medical History:  Diagnosis Date   Bladder cancer ([place])    Coronary artery disease    a.) LHC 04/12/2002 --> LVEF 42%; severe 3v CAD. 4v CABG on 04/13/2002. b.) LHC 02/03/2013 --> LVEF 50%; severe 3v CAD with patent LIMA-LAD and SVG-OM1 grafts; SVG-PDA and SVG-DG1 occluded with collaterals to PDA and DG1 from LAD.   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   a.) LIMA-LAD, SVG-OM1, SVG-D1, SVG-PDA   Tubular adenoma of colon 02/03/2007    Surgical History: Past Surgical History:  Procedure Laterality Date   CARDIAC CATHETERIZATION Left 04/13/2002   Procedure: CARDIAC  CATHETERIZATION; Location: [place]; Surgeon; Isaias Cowman, MD   CARDIAC CATHETERIZATION Left 02/03/2013   Procedure: CARDIAC CATHETERIZATION; Location: [place]; Surgeon: Isaias Cowman, MD   COLONOSCOPY W/ POLYPECTOMY     COLONOSCOPY WITH PROPOFOL N/A 04/09/2015   Procedure: COLONOSCOPY WITH PROPOFOL;  Surgeon: Hulen Luster, MD;  [facility] END[facility];  Service: Gastroenterology;  Laterality: N/A;   CORONARY ANGIOPLASTY     CORONARY ARTERY BYPASS GRAFT N/A 04/13/2002   Procedure: 4v CABG (LIMA-LAD, SVG-OM1, SVG-D1, SVG-PDA); Location: [facility]; Surgeon: Corinna Capra, MD   CYSTOSCOPY N/A 07/17/2021   Procedure: Consuela Mimes;  Surgeon: Abbie Sons, MD;  Location: [facility] ORS;  Service: Urology;  Laterality: N/A;   CYSTOSCOPY WITH BIOPSY N/A 07/17/2021   Procedure: CYSTOSCOPY WITH BIOPSY;  Surgeon: Abbie Sons, MD;  Location: [facility] ORS;  Service: Urology;  Laterality: N/A;   CYSTOSCOPY WITH FULGERATION N/A 07/17/2021   Procedure: CYSTOSCOPY WITH FULGERATION;  Surgeon: Abbie Sons, MD;  Location: [facility] ORS;  Service: Urology;  Laterality: N/A;   TONSILLECTOMY     TRANSURETHRAL RESECTION OF BLADDER TUMOR N/A 11/13/2020   Procedure: TRANSURETHRAL RESECTION OF BLADDER TUMOR (TURBT);  Surgeon: Abbie Sons, MD;  Location: [facility] ORS;  Service: Urology;  Laterality: N/A;   TRANSURETHRAL RESECTION OF PROSTATE      Home Medications:  Allergies as of 07/25/2021       Reactions   Cidex [glutaral] Anaphylaxis        Medication List        Accurate as of July 25, 2021 12:58 PM. If you have any questions, ask your  nurse or doctor.          acetaminophen 500 MG tablet Commonly known as: TYLENOL Take 500 mg by mouth every 6 (six) hours as needed.   aspirin 81 MG tablet Take 81 mg by mouth daily.   atorvastatin 10 MG tablet Commonly known as: LIPITOR Take 1 tablet by mouth daily.   b complex vitamins capsule Take 1 capsule by mouth daily.   cetirizine 10 MG tablet Commonly known as:  ZYRTEC Take 10 mg by mouth at bedtime.   Coenzyme Q10 100 MG capsule Take 100 mg by mouth daily.   Fish Oil 1000 MG Caps Take 1,000 mg by mouth daily.   fluticasone 50 MCG/ACT nasal spray Commonly known as: FLONASE Place 1 spray into both nostrils at bedtime.   Gemtesa 75 MG Tabs Generic drug: Vibegron Take 75 mg by mouth daily. Started by: Abbie Sons, MD   GLUCOSAMINE-CHONDROITIN PO Take 1 tablet by mouth 2 (two) times daily.   Magnesium 200 MG Tabs Take 200 mg by mouth 2 (two) times daily.   Mega Probiotic Caps Take 1 capsule by mouth daily.   metoprolol tartrate 25 MG tablet Commonly known as: LOPRESSOR Take 25 mg by mouth 2 (two) times daily.   metroNIDAZOLE 0.75 % gel Commonly known as: METROGEL Apply 1 application topically 2 (two) times daily.   multivitamin tablet Take 1 tablet by mouth daily.   naproxen sodium 220 MG tablet Commonly known as: ALEVE Take 220 mg by mouth daily as needed (pain).   Potassium Gluconate 595 MG Caps Take 1 capsule by mouth daily as needed (After HT TR exercise).   sildenafil 100 MG tablet Commonly known as: VIAGRA Take 100 mg by mouth daily as needed.   triamterene-hydrochlorothiazide 37.5-25 MG tablet Commonly known as: MAXZIDE-25 Take 1 tablet by mouth daily.   Vitamin A 2400 MCG (8000 UT) Caps Take 8,000 Units by mouth daily.   vitamin B-12 1000 MCG tablet Commonly known as: CYANOCOBALAMIN Take 1,000 mcg by mouth daily.   vitamin C 250 MG tablet Commonly known as: ASCORBIC ACID Take 250 mg by mouth 2 (two) times daily.   VITAMIN D3 PO Take 1,000 Units by mouth daily.   zinc gluconate 50 MG tablet Take 50 mg by mouth daily.        Allergies:  Allergies  Allergen Reactions   Cidex [Glutaral] Anaphylaxis    Family History: Family History  Problem Relation Age of Onset   Prostate cancer Father        Metastatic   Cancer Father     Social History:  reports that he quit smoking about 15 years  ago. His smoking use included cigarettes. He has a 45.00 pack-year smoking history. He has never used smokeless tobacco. He reports current alcohol use of about 2.0 standard drinks of alcohol per week. He reports that he does not use drugs.   Physical Exam: BP (!) 93/53   Pulse 80   Ht '5\' 6"'$  (1.676 m)   Wt 205 lb (93 kg)   BMI 33.09 kg/m   Constitutional:  Alert and oriented, No acute distress. Psychiatric: Normal mood and affect.   Assessment & Plan:    1.  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 regarding any possible clinical trials  2.  Lower urinary tract symptoms Postop irritative voiding symptoms are improving  Samples Gemtesa 75 mg daily x2 weeks  I spent 30 total minutes on the day of the encounter including pre-visit review of the medical record, face-to-face time with the patient, and post visit ordering of labs/imaging/tests.   Abbie Sons, [place] [address] [place], [place] [address] [phone]

## 2021-07-26 ENCOUNTER — Encounter: Payer: Self-pay | Admitting: Urology

## 2021-08-06 ENCOUNTER — Encounter: Payer: Self-pay | Admitting: Urology

## 2021-08-08 ENCOUNTER — Other Ambulatory Visit: Payer: Self-pay | Admitting: Family Medicine

## 2021-08-08 MED ORDER — TAMSULOSIN HCL 0.4 MG PO CAPS: 0.8000 mg | ORAL_CAPSULE | Freq: Every day | ORAL | 3 refills | Status: DC

## 2021-08-15 ENCOUNTER — Encounter: Payer: Self-pay | Admitting: Urology

## 2021-08-15 ENCOUNTER — Other Ambulatory Visit: Payer: Self-pay | Admitting: *Deleted

## 2021-08-15 MED ORDER — TAMSULOSIN HCL 0.4 MG PO CAPS: 0.8000 mg | ORAL_CAPSULE | Freq: Every day | ORAL | 3 refills | Status: DC

## 2021-08-17 ENCOUNTER — Encounter: Payer: Self-pay | Admitting: Urology

## 2021-08-21 ENCOUNTER — Other Ambulatory Visit: Payer: Self-pay | Admitting: Urology

## 2021-08-21 MED ORDER — TAMSULOSIN HCL 0.4 MG PO CAPS: 0.8000 mg | ORAL_CAPSULE | Freq: Every day | ORAL | 3 refills | Status: DC

## 2021-08-22 ENCOUNTER — Other Ambulatory Visit: Payer: Self-pay | Admitting: Urology

## 2021-08-29 ENCOUNTER — Other Ambulatory Visit: Payer: Self-pay | Admitting: Urology

## 2021-09-09 ENCOUNTER — Other Ambulatory Visit: Payer: Self-pay

## 2021-09-13 ENCOUNTER — Encounter: Payer: Self-pay | Admitting: Urology

## 2021-09-17 ENCOUNTER — Encounter: Payer: Self-pay | Admitting: Urology

## 2021-09-18 ENCOUNTER — Other Ambulatory Visit: Payer: Medicare Other

## 2021-09-18 ENCOUNTER — Other Ambulatory Visit: Payer: Self-pay

## 2021-09-18 DIAGNOSIS — Z87440 Personal history of urinary (tract) infections: Secondary | ICD-10-CM

## 2021-09-18 LAB — URINALYSIS, COMPLETE
Bilirubin, UA: NEGATIVE
Glucose, UA: NEGATIVE
Ketones, UA: NEGATIVE
Nitrite, UA: NEGATIVE
Specific Gravity, UA: 1.02 (ref 1.005–1.030)
Urobilinogen, Ur: 0.2 mg/dL (ref 0.2–1.0)
pH, UA: 5.5 (ref 5.0–7.5)

## 2021-09-18 LAB — MICROSCOPIC EXAMINATION
RBC, Urine: >30 /hpf — AB (ref 0–2)
WBC, UA: >30 /hpf — AB (ref 0–5)

## 2021-09-22 LAB — CULTURE, URINE COMPREHENSIVE

## 2021-09-23 ENCOUNTER — Encounter: Payer: Self-pay | Admitting: *Deleted

## 2021-09-23 ENCOUNTER — Other Ambulatory Visit: Payer: Self-pay | Admitting: *Deleted

## 2021-09-23 DIAGNOSIS — C61 Malignant neoplasm of prostate: Secondary | ICD-10-CM

## 2021-10-01 ENCOUNTER — Telehealth: Payer: Self-pay | Admitting: Urology

## 2021-10-01 ENCOUNTER — Encounter: Payer: Self-pay | Admitting: *Deleted

## 2021-10-01 NOTE — Telephone Encounter (Signed)
[name] has a history of recurrent low-grade Ta urothelial carcinoma the bladder.  He underwent a 6-week course of gemcitabine and had significant bladder spasms and discomfort with each treatment and subsequently had a recurrence at his second posttreatment cystoscopy with low-grade Ta.  I spoke with one of the urologic oncologist at [facility] regarding his difficulty undergoing intravesical therapy.  He stated this is a problem they also occur and for patients that cannot tolerate intravesical therapy they typically do biopsy/fulguration for recurrences of low-grade tumors.  I discussed with [name] the option of repeat intravesical therapy versus surveillance and he has elected the latter.  We will schedule a follow-up cystoscopy late September 2023.

## 2021-10-03 ENCOUNTER — Encounter: Payer: Self-pay | Admitting: Urology

## 2021-10-09 ENCOUNTER — Other Ambulatory Visit: Payer: Self-pay | Admitting: *Deleted

## 2021-10-09 ENCOUNTER — Encounter: Payer: Self-pay | Admitting: Urology

## 2021-10-09 MED ORDER — GEMTESA 75 MG PO TABS: 75.0000 mg | ORAL_TABLET | Freq: Every day | ORAL | 12 refills | Status: DC

## 2021-10-10 ENCOUNTER — Encounter: Payer: Self-pay | Admitting: Urology

## 2021-10-10 ENCOUNTER — Other Ambulatory Visit: Payer: Self-pay | Admitting: *Deleted

## 2021-10-10 MED ORDER — GEMTESA 75 MG PO TABS: 75.0000 mg | ORAL_TABLET | Freq: Every day | ORAL | 12 refills | Status: DC

## 2021-10-11 ENCOUNTER — Encounter: Payer: Self-pay | Admitting: Urology

## 2021-10-11 ENCOUNTER — Other Ambulatory Visit: Payer: Self-pay | Admitting: Family Medicine

## 2021-10-11 MED ORDER — GEMTESA 75 MG PO TABS: 75.0000 mg | ORAL_TABLET | Freq: Every day | ORAL | 12 refills | Status: DC

## 2021-10-15 ENCOUNTER — Telehealth: Payer: Self-pay | Admitting: Family Medicine

## 2021-10-15 MED ORDER — TROSPIUM CHLORIDE 20 MG PO TABS: 20.0000 mg | ORAL_TABLET | Freq: Two times a day (BID) | ORAL | 2 refills | Status: DC

## 2021-10-15 NOTE — Telephone Encounter (Signed)
Left message informing patient [name] has been denied by the insurance company. He will need to try other medications in the tier. He has already been on Myrbetriq and Oxybutynin. They are requesting he try Vesicare, Lisbeth Ply, Detrol La, Trospium, Enablex. Please advise

## 2021-10-15 NOTE — Telephone Encounter (Signed)
Patient notified. RX sent to [facility].

## 2021-10-15 NOTE — Telephone Encounter (Signed)
Okay to send in Rx trospium 20 mg twice daily.  I pended an order because not sure what pharmacy he wanted it sent.

## 2021-10-16 ENCOUNTER — Encounter: Payer: Self-pay | Admitting: Urology

## 2021-10-22 ENCOUNTER — Encounter: Payer: Self-pay | Admitting: Urology

## 2021-11-08 ENCOUNTER — Ambulatory Visit (INDEPENDENT_AMBULATORY_CARE_PROVIDER_SITE_OTHER): Payer: Medicare Other | Admitting: Urology

## 2021-11-08 ENCOUNTER — Encounter: Payer: Self-pay | Admitting: Urology

## 2021-11-08 VITALS — BP 150/70 | HR 74 | Ht 66.0 in | Wt 206.0 lb

## 2021-11-08 DIAGNOSIS — D494 Neoplasm of unspecified behavior of bladder: Secondary | ICD-10-CM

## 2021-11-08 DIAGNOSIS — Z8551 Personal history of malignant neoplasm of bladder: Secondary | ICD-10-CM | POA: Diagnosis not present

## 2021-11-08 DIAGNOSIS — R310 Gross hematuria: Secondary | ICD-10-CM

## 2021-11-08 DIAGNOSIS — C679 Malignant neoplasm of bladder, unspecified: Secondary | ICD-10-CM

## 2021-11-08 LAB — URINALYSIS, COMPLETE
Bilirubin, UA: NEGATIVE
Glucose, UA: NEGATIVE
Ketones, UA: NEGATIVE
Nitrite, UA: NEGATIVE
Specific Gravity, UA: 1.025 (ref 1.005–1.030)
Urobilinogen, Ur: 0.2 mg/dL (ref 0.2–1.0)
pH, UA: 5.5 (ref 5.0–7.5)

## 2021-11-08 LAB — MICROSCOPIC EXAMINATION: WBC, UA: >30 /hpf — AB (ref 0–5)

## 2021-11-08 NOTE — Progress Notes (Unsigned)
   11/08/21  CC:  Chief Complaint  Patient presents with   Cysto   ***update Urologic history: 1.  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  Intermediate risk adenocarcinoma prostate -Treated IMRT+ ADT -Last PSA 12/24/2020 0.03  HPI: Storage related voiding symptoms after last TURBT, improving on trospium  Blood pressure (!) 150/70, pulse 74, height '5\' 6"'$  (1.676 m), weight 206 lb (93.4 kg). NED. A&Ox3.   No respiratory distress   Abd soft, NT, ND Normal phallus with bilateral descended testicles  Cystoscopy Procedure Note  Patient identification was confirmed, informed consent was obtained, and patient was prepped using Betadine solution.  Lidocaine jelly was administered per urethral meatus.     Pre-Procedure: - Inspection reveals a normal caliber ureteral meatus.  Procedure: The flexible cystoscope was introduced without difficulty - No urethral strictures/lesions are present. - Moderate lateral lobe enlargement prostate  - Normal bladder neck - Bilateral ureteral orifices identified - Bladder mucosa  reveals no recurrent solid or papillary lesions.  Some fibrinous exudate/old clot noted posterosuperior bladder wall - No bladder stones -Mild trabeculation  Retroflexion shows no abnormalities   Post-Procedure: - Patient tolerated the procedure well  Assessment/ Plan: No evidence recurrent tumor Follow-up surveillance cystoscopy 3 months    Abbie Sons, MD

## 2021-11-10 ENCOUNTER — Encounter: Payer: Self-pay | Admitting: Urology

## 2021-11-15 ENCOUNTER — Encounter: Payer: Self-pay | Admitting: Urology

## 2021-11-29 ENCOUNTER — Encounter: Payer: Self-pay | Admitting: Urology

## 2021-12-02 ENCOUNTER — Telehealth: Payer: Self-pay | Admitting: *Deleted

## 2021-12-02 NOTE — Progress Notes (Unsigned)
12/03/2021 8:26 PM   [name] 06-16-1945 [id number]  Referring provider: Baxter Hire, MD [place],  [name] [address]  Urological history: 1. Recurrent Ta urothelial carcinoma bladder (available record review) TURBT July 2011; low-grade Ta TURBT December 2014; low-grade Ta; postresection mitomycin TURBT 07/2015; low-grade Ta; intravesical gemcitabine x6 weeks TURBT 10/2020;Ta low-grade urothelial carcinoma with small focus suggestive of high-grade features; repeat intravesical gemcitabine 6 completed 01/07/2021 Bx w/ fulguration 07/17/2021; Ta low grade  Cysto (10/2021) - NED   2.  Intermediate risk adenocarcinoma prostate -PSA (12/2020) 0.03 -Treated IMRT+ ADT  3. BPH w/ LU TS -cysto (10/2021) moderate lateral lobe enlargement w/ mild trabeculation -PVR *** -tamsulosin 0.4 mg two capsules daily   4. ED -contributing factors of age, prostate cancer, pelvic radiation, HTN, CAD, HLD, history of smoking and alcohol consumption -sildenafil 100 mg, on-demand-dosing  No chief complaint on file.   HPI: [name] is a 76 y.o. male who presents today for PVR and Gemtesa samples.    I PSS ***  PVR ***    Score:  1-7 Mild 8-19 Moderate 20-35 Severe    PMH: Past Medical History:  Diagnosis Date   Bladder cancer ([place])    Coronary artery disease    a.) LHC 04/12/2002 --> LVEF 42%; severe 3v CAD. 4v CABG on 04/13/2002. b.) LHC 02/03/2013 --> LVEF 50%; severe 3v CAD with patent LIMA-LAD and SVG-OM1 grafts; SVG-PDA and SVG-DG1 occluded with collaterals to PDA and DG1 from LAD.   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   a.) LIMA-LAD, SVG-OM1, SVG-D1,  SVG-PDA   Tubular adenoma of colon 02/03/2007    Surgical History: Past Surgical History:  Procedure Laterality Date   CARDIAC CATHETERIZATION Left 04/13/2002   Procedure: CARDIAC CATHETERIZATION; Location: [place]; Surgeon; Isaias Cowman, MD   CARDIAC CATHETERIZATION Left 02/03/2013   Procedure: CARDIAC CATHETERIZATION; Location: [place]; Surgeon: Isaias Cowman, MD   COLONOSCOPY W/ POLYPECTOMY     COLONOSCOPY WITH PROPOFOL N/A 04/09/2015   Procedure: COLONOSCOPY WITH PROPOFOL;  Surgeon: Hulen Luster, MD;  Location: [facility] ENDOSCOPY;  Service: Gastroenterology;  Laterality: N/A;   CORONARY ANGIOPLASTY     CORONARY ARTERY BYPASS GRAFT N/A 04/13/2002   Procedure: 4v CABG (LIMA-LAD, SVG-OM1, SVG-D1, SVG-PDA); Location: [facility]; Surgeon: Corinna Capra, MD   CYSTOSCOPY N/A 07/17/2021   Procedure: Consuela Mimes;  Surgeon: Abbie Sons, MD;  Location: [facility];  Service: Urology;  Laterality: N/A;   CYSTOSCOPY WITH BIOPSY N/A 07/17/2021   Procedure: CYSTOSCOPY WITH BIOPSY;  Surgeon: Abbie Sons, MD;  Location: [facility];  Service: Urology;  Laterality: N/A;   CYSTOSCOPY WITH FULGERATION N/A 07/17/2021   Procedure: CYSTOSCOPY WITH FULGERATION;  Surgeon: Abbie Sons, MD;  Location: [facility];  Service: Urology;  Laterality: N/A;   TONSILLECTOMY     TRANSURETHRAL RESECTION OF BLADDER TUMOR N/A 11/13/2020   Procedure: TRANSURETHRAL RESECTION OF BLADDER TUMOR (TURBT);  Surgeon: Abbie Sons, MD;  Location: [facility] ORS;  Service: Urology;  Laterality: N/A;   TRANSURETHRAL RESECTION OF PROSTATE      Home Medications:  Allergies as of 12/03/2021       Reactions  Cidex [glutaral] Anaphylaxis        Medication List        Accurate as of December 02, 2021  8:26 PM. If you have any questions, ask your nurse or doctor.          acetaminophen 500 MG tablet Commonly known as: TYLENOL Take 500 mg by mouth every 6 (six) hours as needed.   aspirin 81 MG tablet Take 81 mg by mouth daily.    atorvastatin 10 MG tablet Commonly known as: LIPITOR Take 1 tablet by mouth daily.   b complex vitamins capsule Take 1 capsule by mouth daily.   cetirizine 10 MG tablet Commonly known as: ZYRTEC Take 10 mg by mouth at bedtime.   Coenzyme Q10 100 MG capsule Take 100 mg by mouth daily.   cyanocobalamin 1000 MCG tablet Commonly known as: VITAMIN B12 Take 1,000 mcg by mouth daily.   Fish Oil 1000 MG Caps Take 1,000 mg by mouth daily.   fluticasone 50 MCG/ACT nasal spray Commonly known as: FLONASE Place 1 spray into both nostrils at bedtime.   GLUCOSAMINE-CHONDROITIN PO Take 1 tablet by mouth 2 (two) times daily.   Magnesium 200 MG Tabs Take 200 mg by mouth 2 (two) times daily.   Mega Probiotic Caps Take 1 capsule by mouth daily.   metoprolol tartrate 25 MG tablet Commonly known as: LOPRESSOR Take 25 mg by mouth 2 (two) times daily.   metroNIDAZOLE 0.75 % gel Commonly known as: METROGEL Apply 1 application topically 2 (two) times daily.   multivitamin tablet Take 1 tablet by mouth daily.   naproxen sodium 220 MG tablet Commonly known as: ALEVE Take 220 mg by mouth daily as needed (pain).   nitroGLYCERIN 0.4 MG SL tablet Commonly known as: NITROSTAT Place under the tongue.   Potassium Gluconate 595 MG Caps Take 1 capsule by mouth daily as needed (After HT TR exercise).   sildenafil 100 MG tablet Commonly known as: VIAGRA Take 100 mg by mouth daily as needed.   tamsulosin 0.4 MG Caps capsule Commonly known as: FLOMAX TAKE 2 CAPSULES BY MOUTH EVERY DAY   triamterene-hydrochlorothiazide 37.5-25 MG tablet Commonly known as: MAXZIDE-25 Take 1 tablet by mouth daily.   trospium 20 MG tablet Commonly known as: SANCTURA Take 1 tablet (20 mg total) by mouth 2 (two) times daily.   Vitamin A 2400 MCG (8000 UT) Caps Take 8,000 Units by mouth daily.   vitamin C 250 MG tablet Commonly known as: ASCORBIC ACID Take 250 mg by mouth 2 (two) times daily.    VITAMIN D3 PO Take 1,000 Units by mouth daily.   zinc gluconate 50 MG tablet Take 50 mg by mouth daily.        Allergies:  Allergies  Allergen Reactions   Cidex [Glutaral] Anaphylaxis    Family History: Family History  Problem Relation Age of Onset   Prostate cancer Father        Metastatic   Cancer Father     Social History:  reports that he quit smoking about 15 years ago. His smoking use included cigarettes. He has a 45.00 pack-year smoking history. He has never used smokeless tobacco. He reports current alcohol use of about 2.0 standard drinks of alcohol per week. He reports that he does not use drugs.  ROS: Pertinent ROS in HPI  Physical Exam: There were no vitals taken for this visit.  Constitutional:  Well nourished. Alert and oriented, No acute distress. HEENT: [place] AT, moist mucus membranes.  Trachea midline, no masses. Cardiovascular: No clubbing, cyanosis, or edema. Respiratory: Normal respiratory effort, no increased work of breathing. GI: Abdomen is soft, non tender, non distended, no abdominal masses. Liver and spleen not palpable.  No hernias appreciated.  Stool sample for occult testing is not indicated.   GU: No CVA tenderness.  No bladder fullness or masses.  Patient with circumcised/uncircumcised phallus. ***Foreskin easily retracted***  Urethral meatus is patent.  No penile discharge. No penile lesions or rashes. Scrotum without lesions, cysts, rashes and/or edema.  Testicles are located scrotally bilaterally. No masses are appreciated in the testicles. Left and right epididymis are normal. Rectal: Patient with  normal sphincter tone. Anus and perineum without scarring or rashes. No rectal masses are appreciated. Prostate is approximately *** grams, *** nodules are appreciated. Seminal vesicles are normal. Skin: No rashes, bruises or suspicious lesions. Lymph: No cervical or inguinal adenopathy. Neurologic: Grossly intact, no focal deficits, moving all 4  extremities. Psychiatric: Normal mood and affect.  Laboratory Data: N/A  Pertinent Imaging: ***  Assessment & Plan:  ***  1. Urgency ***  No follow-ups on file.  These notes generated with voice recognition software. I apologize for typographical errors.  [place], [place] [address]  [place], [place] [address] [phone]

## 2021-12-02 NOTE — Telephone Encounter (Signed)
Have him stop the trospium, come in for a bladder scan tomorrow and give him 4 weeks gemtesa samples    Notified patient as instructed, patient pleased. Discussed follow-up appointments, patient agrees

## 2021-12-03 ENCOUNTER — Ambulatory Visit (INDEPENDENT_AMBULATORY_CARE_PROVIDER_SITE_OTHER): Payer: Medicare Other | Admitting: Urology

## 2021-12-03 ENCOUNTER — Encounter: Payer: Self-pay | Admitting: Urology

## 2021-12-03 VITALS — BP 138/69 | HR 89 | Ht 70.0 in | Wt 198.0 lb

## 2021-12-03 DIAGNOSIS — R3915 Urgency of urination: Secondary | ICD-10-CM

## 2021-12-03 LAB — BLADDER SCAN AMB NON-IMAGING

## 2021-12-06 ENCOUNTER — Other Ambulatory Visit: Payer: Self-pay | Admitting: *Deleted

## 2021-12-06 ENCOUNTER — Encounter: Payer: Self-pay | Admitting: Urology

## 2021-12-06 ENCOUNTER — Other Ambulatory Visit: Payer: Medicare Other

## 2021-12-06 DIAGNOSIS — R3915 Urgency of urination: Secondary | ICD-10-CM

## 2021-12-06 DIAGNOSIS — Z87440 Personal history of urinary (tract) infections: Secondary | ICD-10-CM

## 2021-12-06 LAB — URINALYSIS, COMPLETE
Bilirubin, UA: NEGATIVE
Glucose, UA: NEGATIVE
Ketones, UA: NEGATIVE
Nitrite, UA: POSITIVE — AB
Specific Gravity, UA: 1.02 (ref 1.005–1.030)
Urobilinogen, Ur: 0.2 mg/dL (ref 0.2–1.0)
pH, UA: 5.5 (ref 5.0–7.5)

## 2021-12-06 LAB — MICROSCOPIC EXAMINATION: WBC, UA: >30 /hpf — AB (ref 0–5)

## 2021-12-08 ENCOUNTER — Other Ambulatory Visit: Payer: Self-pay | Admitting: Urology

## 2021-12-08 ENCOUNTER — Encounter: Payer: Self-pay | Admitting: Urology

## 2021-12-08 MED ORDER — SULFAMETHOXAZOLE-TRIMETHOPRIM 800-160 MG PO TABS: 1.0000 | ORAL_TABLET | Freq: Two times a day (BID) | ORAL | 0 refills | Status: AC

## 2021-12-11 ENCOUNTER — Other Ambulatory Visit: Payer: Self-pay | Admitting: *Deleted

## 2021-12-11 ENCOUNTER — Encounter: Payer: Self-pay | Admitting: *Deleted

## 2021-12-11 ENCOUNTER — Encounter: Payer: Self-pay | Admitting: Urology

## 2021-12-11 LAB — CULTURE, URINE COMPREHENSIVE

## 2021-12-11 MED ORDER — CEFUROXIME AXETIL 500 MG PO TABS: 500.0000 mg | ORAL_TABLET | Freq: Two times a day (BID) | ORAL | 0 refills | Status: AC

## 2021-12-19 ENCOUNTER — Encounter: Payer: Self-pay | Admitting: Urology

## 2021-12-30 ENCOUNTER — Encounter: Payer: Self-pay | Admitting: Urology

## 2021-12-30 ENCOUNTER — Inpatient Hospital Stay: Payer: Medicare Other | Attending: Radiation Oncology

## 2021-12-30 DIAGNOSIS — R350 Frequency of micturition: Secondary | ICD-10-CM | POA: Diagnosis not present

## 2021-12-30 DIAGNOSIS — Z8042 Family history of malignant neoplasm of prostate: Secondary | ICD-10-CM | POA: Insufficient documentation

## 2021-12-30 DIAGNOSIS — C61 Malignant neoplasm of prostate: Secondary | ICD-10-CM | POA: Diagnosis present

## 2021-12-30 DIAGNOSIS — Z87891 Personal history of nicotine dependence: Secondary | ICD-10-CM | POA: Insufficient documentation

## 2021-12-30 DIAGNOSIS — C674 Malignant neoplasm of posterior wall of bladder: Secondary | ICD-10-CM | POA: Insufficient documentation

## 2021-12-30 LAB — PSA: Prostatic Specific Antigen: 0.08 ng/mL (ref 0.00–4.00)

## 2022-01-01 ENCOUNTER — Encounter: Payer: Self-pay | Admitting: Urology

## 2022-01-02 ENCOUNTER — Other Ambulatory Visit: Payer: Self-pay | Admitting: *Deleted

## 2022-01-02 ENCOUNTER — Other Ambulatory Visit: Payer: Medicare Other

## 2022-01-02 DIAGNOSIS — R3915 Urgency of urination: Secondary | ICD-10-CM

## 2022-01-02 LAB — URINALYSIS, COMPLETE
Bilirubin, UA: NEGATIVE
Glucose, UA: NEGATIVE
Ketones, UA: NEGATIVE
Nitrite, UA: POSITIVE — AB
Specific Gravity, UA: 1.015 (ref 1.005–1.030)
Urobilinogen, Ur: 0.2 mg/dL (ref 0.2–1.0)
pH, UA: 6 (ref 5.0–7.5)

## 2022-01-02 LAB — MICROSCOPIC EXAMINATION: WBC, UA: >30 /hpf — AB (ref 0–5)

## 2022-01-02 NOTE — Addendum Note (Signed)
Addended by: Despina Hidden on: 01/02/2022 04:33 PM   Modules accepted: Orders

## 2022-01-03 ENCOUNTER — Encounter: Payer: Self-pay | Admitting: *Deleted

## 2022-01-05 ENCOUNTER — Encounter: Payer: Self-pay | Admitting: Urology

## 2022-01-06 ENCOUNTER — Other Ambulatory Visit: Payer: Self-pay | Admitting: *Deleted

## 2022-01-06 ENCOUNTER — Ambulatory Visit: Payer: Medicare Other | Admitting: Radiation Oncology

## 2022-01-06 ENCOUNTER — Encounter: Payer: Self-pay | Admitting: *Deleted

## 2022-01-06 LAB — CULTURE, URINE COMPREHENSIVE

## 2022-01-06 MED ORDER — AMOXICILLIN 875 MG PO TABS: 875.0000 mg | ORAL_TABLET | Freq: Two times a day (BID) | ORAL | 0 refills | Status: AC

## 2022-01-07 ENCOUNTER — Encounter: Payer: Self-pay | Admitting: Urology

## 2022-01-24 ENCOUNTER — Encounter: Payer: Self-pay | Admitting: Urology

## 2022-01-30 ENCOUNTER — Encounter: Payer: Self-pay | Admitting: Urology

## 2022-01-31 ENCOUNTER — Encounter: Payer: Self-pay | Admitting: Urology

## 2022-02-06 ENCOUNTER — Telehealth: Payer: Self-pay | Admitting: *Deleted

## 2022-02-06 ENCOUNTER — Encounter: Payer: Self-pay | Admitting: Urology

## 2022-02-11 ENCOUNTER — Encounter: Payer: Self-pay | Admitting: Urology

## 2022-02-11 NOTE — Telephone Encounter (Signed)
Error

## 2022-02-12 ENCOUNTER — Encounter: Payer: Self-pay | Admitting: Urology

## 2022-02-13 ENCOUNTER — Other Ambulatory Visit: Payer: Medicare Other | Admitting: Urology

## 2022-02-13 ENCOUNTER — Encounter: Payer: Self-pay | Admitting: Urology

## 2022-02-19 ENCOUNTER — Ambulatory Visit (INDEPENDENT_AMBULATORY_CARE_PROVIDER_SITE_OTHER): Payer: Medicare Other | Admitting: Urology

## 2022-02-19 ENCOUNTER — Encounter: Payer: Self-pay | Admitting: Urology

## 2022-02-19 VITALS — BP 121/69 | HR 76 | Ht 70.0 in | Wt 182.0 lb

## 2022-02-19 DIAGNOSIS — R350 Frequency of micturition: Secondary | ICD-10-CM

## 2022-02-19 DIAGNOSIS — Z8551 Personal history of malignant neoplasm of bladder: Secondary | ICD-10-CM | POA: Diagnosis not present

## 2022-02-19 DIAGNOSIS — R310 Gross hematuria: Secondary | ICD-10-CM

## 2022-02-19 DIAGNOSIS — C679 Malignant neoplasm of bladder, unspecified: Secondary | ICD-10-CM

## 2022-02-19 LAB — URINALYSIS, COMPLETE
Bilirubin, UA: NEGATIVE
Glucose, UA: NEGATIVE
Ketones, UA: NEGATIVE
Nitrite, UA: NEGATIVE
Specific Gravity, UA: 1.01 (ref 1.005–1.030)
Urobilinogen, Ur: 0.2 mg/dL (ref 0.2–1.0)
pH, UA: 6 (ref 5.0–7.5)

## 2022-02-19 LAB — MICROSCOPIC EXAMINATION
RBC, Urine: >30 /hpf — AB (ref 0–2)
WBC, UA: >30 /hpf — AB (ref 0–5)

## 2022-02-19 LAB — BLADDER SCAN AMB NON-IMAGING: Scan Result: 190

## 2022-02-19 MED ORDER — SILODOSIN 8 MG PO CAPS: 8.0000 mg | ORAL_CAPSULE | Freq: Every day | ORAL | 1 refills | Status: DC

## 2022-02-19 NOTE — Progress Notes (Signed)
   02/19/22  CC:  Chief Complaint  Patient presents with   Cysto    Urologic history: 1.  Recurrent Ta urothelial carcinoma bladder (available record review) TURBT July 2011; low-grade Ta TURBT December 2014; low-grade Ta; postresection mitomycin TURBT 07/2015; low-grade Ta; intravesical gemcitabine x6 weeks TURBT 10/2020;Ta low-grade urothelial carcinoma with small focus suggestive of high-grade features; repeat intravesical gemcitabine 6 completed 01/07/2021 Bx w/ fulguration 07/17/2021; Ta low grade   2.  Intermediate risk adenocarcinoma prostate -Treated IMRT+ ADT -Last PSA 12/24/2020 0.03  HPI: Has had 3 months of urinary frequency, urgency, frequent nocturia with incontinence.  He has had 2 documented UTIs and treated and developed significant diarrhea with antibiotic therapy.  C. difficile tested negative by PCP.  Presently using a condom catheter which has helped.  Presently on Gemtesa and has stopped tamsulosin.  PVR today 190 mL  Blood pressure (!) 150/70, pulse 74, height '5\' 6"'$  (1.676 m), weight 206 lb (93.4 kg).   Cystoscopy Procedure Note  Patient identification was confirmed, informed consent was obtained, and patient was prepped using Betadine solution.  Lidocaine jelly was administered per urethral meatus.     Pre-Procedure: - Inspection reveals a normal caliber urethral meatus.  Procedure: The flexible cystoscope was introduced without difficulty - No urethral strictures/lesions are present. - Moderate lateral lobe enlargement prostate  - Normal bladder neck - Visualization was suboptimal and ~ 150 mL of cloudy urine was aspirated from the bladder - Bilateral ureteral orifices identified - Bladder mucosa  reveals no recurrent solid or papillary lesions.  Scattered hyperemia/erythema dome/posterior wall - No bladder stones -Mild trabeculation  Retroflexion shows no abnormalities   Post-Procedure: - Patient tolerated the procedure well  Assessment/  Plan: No evidence recurrent tumor; urine cytology sent PVR today was 190 mL and he is presently not on alpha-blocker.  Rx silodosin sent to pharmacy Would not recommend Botox based on incomplete bladder emptying Repeat PVR 2 weeks Urine culture also ordered   Abbie Sons, MD

## 2022-02-20 ENCOUNTER — Encounter: Payer: Self-pay | Admitting: Urology

## 2022-02-20 LAB — CYTOLOGY - NON PAP

## 2022-02-21 ENCOUNTER — Encounter: Payer: Self-pay | Admitting: *Deleted

## 2022-02-25 ENCOUNTER — Encounter: Payer: Self-pay | Admitting: Urology

## 2022-02-25 LAB — CULTURE, URINE COMPREHENSIVE

## 2022-02-26 ENCOUNTER — Other Ambulatory Visit: Payer: Self-pay | Admitting: *Deleted

## 2022-02-26 ENCOUNTER — Encounter: Payer: Self-pay | Admitting: *Deleted

## 2022-02-26 MED ORDER — AMOXICILLIN 875 MG PO TABS: 875.0000 mg | ORAL_TABLET | Freq: Two times a day (BID) | ORAL | 0 refills | Status: DC

## 2022-02-26 NOTE — Addendum Note (Signed)
Addended by: Chrystie Nose on: 02/26/2022 11:44 AM   Modules accepted: Orders

## 2022-03-06 ENCOUNTER — Ambulatory Visit (INDEPENDENT_AMBULATORY_CARE_PROVIDER_SITE_OTHER): Payer: Medicare Other | Admitting: Physician Assistant

## 2022-03-06 ENCOUNTER — Encounter: Payer: Self-pay | Admitting: Physician Assistant

## 2022-03-06 ENCOUNTER — Encounter: Payer: Self-pay | Admitting: Urology

## 2022-03-06 VITALS — BP 124/68 | HR 67 | Ht 66.0 in | Wt 183.0 lb

## 2022-03-06 DIAGNOSIS — R350 Frequency of micturition: Secondary | ICD-10-CM

## 2022-03-06 DIAGNOSIS — N401 Enlarged prostate with lower urinary tract symptoms: Secondary | ICD-10-CM | POA: Diagnosis not present

## 2022-03-06 LAB — BLADDER SCAN AMB NON-IMAGING

## 2022-03-06 NOTE — Progress Notes (Signed)
03/06/2022 4:18 PM   [name] 02-27-1945 [id number]  CC: Chief Complaint  Patient presents with   Urinary Retention   Follow-up   HPI: [name] is a 77 y.o. male with PMH recurrent urothelial carcinoma of the bladder, intermediate risk prostate cancer s/p IMRT plus ADT, BPH with LUTS on silodosin and Gemtesa, and ED on sildenafil who presents today for symptom recheck after starting silodosin per Dr. Bernardo Heater.   Today he reports he is tolerating the silodosin without orthostasis.  He thinks overall his urinary symptoms may be slightly improved, however he still having bothersome urinary leakage.  He denies dysuria or bladder pain.  PVR 212 mL.  PMH: Past Medical History:  Diagnosis Date   Bladder cancer ([place])    Coronary artery disease    a.) LHC 04/12/2002 --> LVEF 42%; severe 3v CAD. 4v CABG on 04/13/2002. b.) LHC 02/03/2013 --> LVEF 50%; severe 3v CAD with patent LIMA-LAD and SVG-OM1 grafts; SVG-PDA and SVG-DG1 occluded with collaterals to PDA and DG1 from LAD.   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   a.) LIMA-LAD, SVG-OM1, SVG-D1, SVG-PDA   Tubular adenoma of colon 02/03/2007    Surgical History: Past Surgical History:  Procedure Laterality Date   CARDIAC CATHETERIZATION Left 04/13/2002   Procedure: CARDIAC CATHETERIZATION; Location: [place]; Surgeon; Isaias Cowman, MD   CARDIAC CATHETERIZATION Left 02/03/2013   Procedure: CARDIAC CATHETERIZATION; Location: [place]; Surgeon: Isaias Cowman, MD   COLONOSCOPY W/ POLYPECTOMY     COLONOSCOPY WITH PROPOFOL N/A 04/09/2015   Procedure: COLONOSCOPY WITH PROPOFOL;  Surgeon: Hulen Luster, MD;  Location: [facility] ENDOSCOPY;   Service: Gastroenterology;  Laterality: N/A;   CORONARY ANGIOPLASTY     CORONARY ARTERY BYPASS GRAFT N/A 04/13/2002   Procedure: 4v CABG (LIMA-LAD, SVG-OM1, SVG-D1, SVG-PDA); Location: [facility]; Surgeon: Corinna Capra, MD   CYSTOSCOPY N/A 07/17/2021   Procedure: Consuela Mimes;  Surgeon: Abbie Sons, MD;  Location: [facility];  Service: Urology;  Laterality: N/A;   CYSTOSCOPY WITH BIOPSY N/A 07/17/2021   Procedure: CYSTOSCOPY WITH BIOPSY;  Surgeon: Abbie Sons, MD;  Location: [facility];  Service: Urology;  Laterality: N/A;   CYSTOSCOPY WITH FULGERATION N/A 07/17/2021   Procedure: CYSTOSCOPY WITH FULGERATION;  Surgeon: Abbie Sons, MD;  Location: [facility];  Service: Urology;  Laterality: N/A;   TONSILLECTOMY     TRANSURETHRAL RESECTION OF BLADDER TUMOR N/A 11/13/2020   Procedure: TRANSURETHRAL RESECTION OF BLADDER TUMOR (TURBT);  Surgeon: Abbie Sons, MD;  Location: [facility];  Service: Urology;  Laterality: N/A;   TRANSURETHRAL RESECTION OF PROSTATE      Home Medications:  Allergies as of 03/06/2022       Reactions   Cidex [glutaral] Anaphylaxis        Medication List        Accurate as of March 06, 2022  4:18 PM. If you have any questions, ask your nurse or doctor.          acetaminophen 500 MG tablet Commonly known as: TYLENOL Take 500 mg by mouth every 6 (six) hours as needed.   amoxicillin 875 MG tablet Commonly known as: AMOXIL Take 1 tablet (875 mg  total) by mouth 2 (two) times daily.   aspirin 81 MG tablet Take 81 mg by mouth daily.   atorvastatin 10 MG tablet Commonly known as: LIPITOR Take 1 tablet by mouth daily.   b complex vitamins capsule Take 1 capsule by mouth daily.   cetirizine 10 MG tablet Commonly known as: ZYRTEC Take 10 mg by mouth at bedtime.   Coenzyme Q10 100 MG capsule Take 100 mg by mouth daily.   cyanocobalamin 1000 MCG tablet Commonly known as: VITAMIN B12 Take 1,000 mcg by mouth daily.   Fish Oil 1000 MG Caps Take 1,000 mg by  mouth daily.   fluticasone 50 MCG/ACT nasal spray Commonly known as: FLONASE Place 1 spray into both nostrils at bedtime.   GLUCOSAMINE-CHONDROITIN PO Take 1 tablet by mouth 2 (two) times daily.   Magnesium 200 MG Tabs Take 200 mg by mouth 2 (two) times daily.   Mega Probiotic Caps Take 1 capsule by mouth daily.   metoprolol tartrate 25 MG tablet Commonly known as: LOPRESSOR Take 25 mg by mouth 2 (two) times daily.   metroNIDAZOLE 0.75 % gel Commonly known as: METROGEL Apply 1 application topically 2 (two) times daily.   multivitamin tablet Take 1 tablet by mouth daily.   naproxen sodium 220 MG tablet Commonly known as: ALEVE Take 220 mg by mouth daily as needed (pain).   nitroGLYCERIN 0.4 MG SL tablet Commonly known as: NITROSTAT Place under the tongue.   Potassium Gluconate 595 MG Caps Take 1 capsule by mouth daily as needed (After HT TR exercise).   sildenafil 100 MG tablet Commonly known as: VIAGRA Take 100 mg by mouth daily as needed.   silodosin 8 MG Caps capsule Commonly known as: RAPAFLO Take 1 capsule (8 mg total) by mouth daily with breakfast.   triamterene-hydrochlorothiazide 37.5-25 MG tablet Commonly known as: MAXZIDE-25 Take 1 tablet by mouth daily.   Vitamin A 2400 MCG (8000 UT) Caps Take 8,000 Units by mouth daily.   vitamin C 250 MG tablet Commonly known as: ASCORBIC ACID Take 250 mg by mouth 2 (two) times daily.   VITAMIN D3 PO Take 1,000 Units by mouth daily.   zinc gluconate 50 MG tablet Take 50 mg by mouth daily.        Allergies:  Allergies  Allergen Reactions   Cidex [Glutaral] Anaphylaxis    Family History: Family History  Problem Relation Age of Onset   Prostate cancer Father        Metastatic   Cancer Father     Social History:   reports that he quit smoking about 16 years ago. His smoking use included cigarettes. He has a 45.00 pack-year smoking history. He has been exposed to tobacco smoke. He has never used  smokeless tobacco. He reports current alcohol use of about 2.0 standard drinks of alcohol per week. He reports that he does not use drugs.  Physical Exam: BP 124/68   Pulse 67   Ht '5\' 6"'$  (1.676 m)   Wt 183 lb (83 kg)   BMI 29.54 kg/m   Constitutional:  Alert and oriented, no acute distress, nontoxic appearing HEENT: [place], AT Cardiovascular: No clubbing, cyanosis, or edema Respiratory: Normal respiratory effort, no increased work of breathing Skin: No rashes, bruises or suspicious lesions Neurologic: Grossly intact, no focal deficits, moving all 4 extremities Psychiatric: Normal mood and affect  Laboratory Data: Results for orders placed or performed in visit on 03/06/22  BLADDER SCAN AMB NON-IMAGING  Result Value Ref Range  Scan Result 230m    Assessment & Plan:   1. Benign prostatic hyperplasia with urinary frequency Slight symptomatic improvement after adding silodosin, however his bladder residual remains stably elevated today.  Will have him stop Gemtesa and plan for symptom recheck in another 2 weeks.  He is in agreement with this plan. - BLADDER SCAN AMB NON-IMAGING   Return in 2 weeks (on 03/20/2022) for follow-up PVR.  SDebroah Loop PA-C  [facility] [address] [place] [phone]

## 2022-03-06 NOTE — Patient Instructions (Signed)
Stop the Gemtesa, continue the silodosin, and we'll recheck your symptoms in 2 weeks.

## 2022-03-07 ENCOUNTER — Other Ambulatory Visit: Payer: Self-pay | Admitting: Physician Assistant

## 2022-03-07 MED ORDER — SILODOSIN 8 MG PO CAPS: 8.0000 mg | ORAL_CAPSULE | Freq: Every day | ORAL | 1 refills | Status: DC

## 2022-03-14 ENCOUNTER — Encounter: Payer: Self-pay | Admitting: Urology

## 2022-03-26 ENCOUNTER — Ambulatory Visit (INDEPENDENT_AMBULATORY_CARE_PROVIDER_SITE_OTHER): Payer: Medicare Other | Admitting: Physician Assistant

## 2022-03-26 VITALS — BP 131/67 | HR 64 | Ht 66.0 in | Wt 182.5 lb

## 2022-03-26 DIAGNOSIS — R350 Frequency of micturition: Secondary | ICD-10-CM

## 2022-03-26 DIAGNOSIS — N401 Enlarged prostate with lower urinary tract symptoms: Secondary | ICD-10-CM | POA: Diagnosis not present

## 2022-03-26 LAB — BLADDER SCAN AMB NON-IMAGING: Scan Result: 113

## 2022-03-26 MED ORDER — GEMTESA 75 MG PO TABS: 75.0000 mg | ORAL_TABLET | Freq: Every day | ORAL | 11 refills | Status: DC

## 2022-03-26 MED ORDER — SILODOSIN 8 MG PO CAPS: 8.0000 mg | ORAL_CAPSULE | Freq: Every day | ORAL | 11 refills | Status: DC

## 2022-03-26 NOTE — Progress Notes (Signed)
03/26/2022 1:47 PM   [name] [birth year] [id number]  CC: Chief Complaint  Patient presents with   Benign Prostatic Hypertrophy   HPI: [name] is a 77 y.o. male with PMH recurrent urothelial carcinoma of the bladder, intermediate risk prostate cancer s/p IMRT plus ADT, BPH with LUTS on silodosin, and ED on sildenafil who presents today for symptom recheck after stopping Gemtesa in the setting of slightly elevated postvoid residual.   Today he reports his continuous urinary leakage, frequency, and urgency is much worse after having stopped Gemtesa.  He is now using 9 depends and 3 pads during the daytime as well as 6-7 depends overnight.  He thinks the [place] was helping his urinary symptoms much more than he realized.  PVR 113 mL.  He previously was using Gemtesa samples, as prescriptions were cost prohibitive for him.  Notably, his insurance coverage recently changed.  PMH: Past Medical History:  Diagnosis Date   Bladder cancer ([place])    Coronary artery disease    a.) LHC 04/12/2002 --> LVEF 42%; severe 3v CAD. 4v CABG on 04/13/2002. b.) LHC 02/03/2013 --> LVEF 50%; severe 3v CAD with patent LIMA-LAD and SVG-OM1 grafts; SVG-PDA and SVG-DG1 occluded with collaterals to PDA and DG1 from LAD.   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   a.) LIMA-LAD, SVG-OM1, SVG-D1, SVG-PDA   Tubular adenoma of colon 02/03/2007    Surgical History: Past Surgical History:  Procedure Laterality Date   CARDIAC CATHETERIZATION Left 04/13/2002   Procedure: CARDIAC CATHETERIZATION; Location: [place]; Surgeon; Isaias Cowman, MD   CARDIAC CATHETERIZATION Left 02/03/2013   Procedure: CARDIAC  CATHETERIZATION; Location: [place]; Surgeon: Isaias Cowman, MD   COLONOSCOPY W/ POLYPECTOMY     COLONOSCOPY WITH PROPOFOL N/A 04/09/2015   Procedure: COLONOSCOPY WITH PROPOFOL;  Surgeon: Hulen Luster, MD;  Location: [facility] ENDOSCOPY;  Service: Gastroenterology;  Laterality: N/A;   CORONARY ANGIOPLASTY     CORONARY ARTERY BYPASS GRAFT N/A 04/13/2002   Procedure: 4v CABG (LIMA-LAD, SVG-OM1, SVG-D1, SVG-PDA); Location: [facility]; Surgeon: Corinna Capra, MD   CYSTOSCOPY N/A 07/17/2021   Procedure: Consuela Mimes;  Surgeon: Abbie Sons, MD;  Location: [facility];  Service: Urology;  Laterality: N/A;   CYSTOSCOPY WITH BIOPSY N/A 07/17/2021   Procedure: CYSTOSCOPY WITH BIOPSY;  Surgeon: Abbie Sons, MD;  Location: [facility];  Service: Urology;  Laterality: N/A;   CYSTOSCOPY WITH FULGERATION N/A 07/17/2021   Procedure: CYSTOSCOPY WITH FULGERATION;  Surgeon: Abbie Sons, MD;  Location: [facility];  Service: Urology;  Laterality: N/A;   TONSILLECTOMY     TRANSURETHRAL RESECTION OF BLADDER TUMOR N/A 11/13/2020   Procedure: TRANSURETHRAL RESECTION OF BLADDER TUMOR (TURBT);  Surgeon: Abbie Sons, MD;  Location: [facility];  Service: Urology;  Laterality: N/A;   TRANSURETHRAL RESECTION OF PROSTATE      Home Medications:  Allergies as of 03/26/2022       Reactions   Cidex [glutaral] Anaphylaxis        Medication List        Accurate as of March 26, 2022  1:47 PM. If you have any questions, ask your nurse or doctor.  STOP taking these medications    amoxicillin 875 MG tablet Commonly known as: AMOXIL Stopped by: Debroah Loop, PA-C       TAKE these medications    acetaminophen 500 MG tablet Commonly known as: TYLENOL Take 500 mg by mouth every 6 (six) hours as needed.   aspirin 81 MG tablet Take 81 mg by mouth daily.   atorvastatin 10 MG tablet Commonly known as: LIPITOR Take 1 tablet by mouth daily.   b complex vitamins capsule Take 1 capsule by mouth daily.    cetirizine 10 MG tablet Commonly known as: ZYRTEC Take 10 mg by mouth at bedtime.   Coenzyme Q10 100 MG capsule Take 100 mg by mouth daily.   cyanocobalamin 1000 MCG tablet Commonly known as: VITAMIN B12 Take 1,000 mcg by mouth daily.   Fish Oil 1000 MG Caps Take 1,000 mg by mouth daily.   fluticasone 50 MCG/ACT nasal spray Commonly known as: FLONASE Place 1 spray into both nostrils at bedtime.   Gemtesa 75 MG Tabs Generic drug: Vibegron Take 1 tablet (75 mg total) by mouth daily. Started by: Debroah Loop, PA-C   GLUCOSAMINE-CHONDROITIN PO Take 1 tablet by mouth 2 (two) times daily.   Magnesium 200 MG Tabs Take 200 mg by mouth 2 (two) times daily.   Mega Probiotic Caps Take 1 capsule by mouth daily.   metoprolol tartrate 25 MG tablet Commonly known as: LOPRESSOR Take 25 mg by mouth 2 (two) times daily.   metroNIDAZOLE 0.75 % gel Commonly known as: METROGEL Apply 1 application topically 2 (two) times daily.   multivitamin tablet Take 1 tablet by mouth daily.   naproxen sodium 220 MG tablet Commonly known as: ALEVE Take 220 mg by mouth daily as needed (pain).   nitroGLYCERIN 0.4 MG SL tablet Commonly known as: NITROSTAT Place under the tongue.   Potassium Gluconate 595 MG Caps Take 1 capsule by mouth daily as needed (After HT TR exercise).   sildenafil 100 MG tablet Commonly known as: VIAGRA Take 100 mg by mouth daily as needed.   silodosin 8 MG Caps capsule Commonly known as: RAPAFLO Take 1 capsule (8 mg total) by mouth daily with breakfast.   triamterene-hydrochlorothiazide 37.5-25 MG tablet Commonly known as: MAXZIDE-25 Take 1 tablet by mouth daily.   Vitamin A 2400 MCG (8000 UT) Caps Take 8,000 Units by mouth daily.   vitamin C 250 MG tablet Commonly known as: ASCORBIC ACID Take 250 mg by mouth 2 (two) times daily.   VITAMIN D3 PO Take 1,000 Units by mouth daily.   zinc gluconate 50 MG tablet Take 50 mg by mouth daily.         Allergies:  Allergies  Allergen Reactions   Cidex [Glutaral] Anaphylaxis    Family History: Family History  Problem Relation Age of Onset   Prostate cancer Father        Metastatic   Cancer Father     Social History:   reports that he quit smoking about 16 years ago. His smoking use included cigarettes. He has a 45.00 pack-year smoking history. He has been exposed to tobacco smoke. He has never used smokeless tobacco. He reports current alcohol use of about 2.0 standard drinks of alcohol per week. He reports that he does not use drugs.  Physical Exam: BP 131/67   Pulse 64   Ht '5\' 6"'$  (1.676 m)   Wt 182 lb 8 oz (82.8 kg)   BMI 29.46 kg/m   Constitutional:  Alert and  oriented, no acute distress, nontoxic appearing HEENT: [place], AT Cardiovascular: No clubbing, cyanosis, or edema Respiratory: Normal respiratory effort, no increased work of breathing Skin: No rashes, bruises or suspicious lesions Neurologic: Grossly intact, no focal deficits, moving all 4 extremities Psychiatric: Normal mood and affect  Laboratory Data: Results for orders placed or performed in visit on 03/26/22  Bladder Scan (Post Void Residual) in office  Result Value Ref Range   Scan Result 113 ml    Assessment & Plan:   1. Benign prostatic hyperplasia with urinary frequency PVR is improved after stopping Gemtesa, however his urinary leakage and overactivity symptoms have significantly worsened.  Will resume Gemtesa.  I provided him with samples and I am sending in a prescription for him to attempt to fill.  We discussed that if this remains cost prohibitive on his new insurance, we can continue to provide him samples as best we are able.  Will also refill silodosin and have him continue this. - Bladder Scan (Post Void Residual) in office - Vibegron (GEMTESA) 75 MG TABS; Take 1 tablet (75 mg total) by mouth daily.  Dispense: 30 tablet; Refill: 11 - silodosin (RAPAFLO) 8 MG CAPS capsule; Take 1 capsule (8  mg total) by mouth daily with breakfast.  Dispense: 30 capsule; Refill: 11   Return in about 2 months (around 05/25/2022) for Cysto with Dr. Bernardo Heater.  Debroah Loop, PA-C  [facility] [address] [place], [place] [address] [phone]

## 2022-03-27 ENCOUNTER — Other Ambulatory Visit: Payer: Self-pay | Admitting: *Deleted

## 2022-03-27 DIAGNOSIS — C61 Malignant neoplasm of prostate: Secondary | ICD-10-CM

## 2022-04-03 ENCOUNTER — Inpatient Hospital Stay: Payer: Medicare Other | Attending: Radiation Oncology

## 2022-04-03 DIAGNOSIS — C674 Malignant neoplasm of posterior wall of bladder: Secondary | ICD-10-CM | POA: Diagnosis not present

## 2022-04-03 DIAGNOSIS — Z8546 Personal history of malignant neoplasm of prostate: Secondary | ICD-10-CM | POA: Diagnosis not present

## 2022-04-03 DIAGNOSIS — C61 Malignant neoplasm of prostate: Secondary | ICD-10-CM

## 2022-04-03 LAB — PSA: Prostatic Specific Antigen: 0.08 ng/mL (ref 0.00–4.00)

## 2022-04-09 ENCOUNTER — Ambulatory Visit
Admission: RE | Admit: 2022-04-09 | Discharge: 2022-04-09 | Disposition: A | Discharge status: Home / Self Care | Payer: Medicare Other | Source: Ambulatory Visit | Attending: Radiation Oncology | Admitting: Radiation Oncology

## 2022-04-09 ENCOUNTER — Encounter: Payer: Self-pay | Admitting: Radiation Oncology

## 2022-04-09 VITALS — BP 121/69 | HR 67 | Temp 98.0°F | Resp 16 | Ht 66.0 in | Wt 179.3 lb

## 2022-04-09 DIAGNOSIS — C61 Malignant neoplasm of prostate: Secondary | ICD-10-CM | POA: Diagnosis not present

## 2022-04-09 DIAGNOSIS — Z923 Personal history of irradiation: Secondary | ICD-10-CM | POA: Insufficient documentation

## 2022-04-09 NOTE — Progress Notes (Signed)
Radiation Oncology Follow up Note  Name: [name]   Date:   04/09/2022 MRN:  [id number] DOB: 1945/04/12    This 77 y.o. male presents to the clinic today for 3 and half year follow-up status post IMRT radiation therapy for Gleason 7 adenocarcinoma prostate presenting with a PSA of 17.  REFERRING PROVIDER: Baxter Hire, MD  HPI: This is a 77 year old male now out over 3-1/2 years status post IMRT radiation therapy for Gleason 7 adenocarcinoma.  He has been having problems with urgency frequency urinary tract infections.  He has had multiple TURBTs dating back to July 2011 and recently back in May had biopsy with fulguration for a low-grade bladder cancer.  He has been treated with multiple episodes of antibiotic therapy presently using a condom catheter which has helped and presently on [place]  His most recent PSA is 0.08 stable from 3 months ago was 0.031-year ago.  COMPLICATIONS OF TREATMENT: none  FOLLOW UP COMPLIANCE: keeps appointments   PHYSICAL EXAM:  BP 121/69   Pulse 67   Temp 98 F (36.7 C)   Resp 16   Ht 5' 6"$  (1.676 m)   Wt 179 lb 4.8 oz (81.3 kg)   BMI 28.94 kg/m  Well-developed well-nourished patient in NAD. HEENT reveals PERLA, EOMI, discs not visualized.  Oral cavity is clear. No oral mucosal lesions are identified. Neck is clear without evidence of cervical or supraclavicular adenopathy. Lungs are clear to A&P. Cardiac examination is essentially unremarkable with regular rate and rhythm without murmur rub or thrill. Abdomen is benign with no organomegaly or masses noted. Motor sensory and DTR levels are equal and symmetric in the upper and lower extremities. Cranial nerves II through XII are grossly intact. Proprioception is intact. No peripheral adenopathy or edema is identified. No motor or sensory levels are noted. Crude visual fields are within normal range.  RADIOLOGY RESULTS: No current films for review  PLAN: Present time patient is biochemically  stable from the standpoint of prostate cancer.  He continues close follow-up care and treatment through Dr. Bernardo Heater for his recurrent UTIs and bladder cancer.  I have asked to see him back in 6 months with a follow-up PSA at that time.  Patient is to call with any concerns.  I would like to take this opportunity to thank you for allowing me to participate in the care of your patient.[name], MD

## 2022-04-14 ENCOUNTER — Encounter: Payer: Self-pay | Admitting: Urology

## 2022-04-17 ENCOUNTER — Telehealth: Payer: Self-pay

## 2022-04-17 ENCOUNTER — Observation Stay: Payer: Medicare Other

## 2022-04-17 ENCOUNTER — Observation Stay
Admission: EM | Admit: 2022-04-17 | Discharge: 2022-04-18 | Disposition: A | Discharge status: Home / Self Care | Payer: Medicare Other | Attending: Internal Medicine | Admitting: Internal Medicine

## 2022-04-17 ENCOUNTER — Encounter: Payer: Self-pay | Admitting: Internal Medicine

## 2022-04-17 ENCOUNTER — Other Ambulatory Visit: Payer: Self-pay

## 2022-04-17 DIAGNOSIS — N1330 Unspecified hydronephrosis: Secondary | ICD-10-CM

## 2022-04-17 DIAGNOSIS — E871 Hypo-osmolality and hyponatremia: Secondary | ICD-10-CM | POA: Diagnosis not present

## 2022-04-17 DIAGNOSIS — Z79899 Other long term (current) drug therapy: Secondary | ICD-10-CM | POA: Insufficient documentation

## 2022-04-17 DIAGNOSIS — R338 Other retention of urine: Secondary | ICD-10-CM

## 2022-04-17 DIAGNOSIS — Z955 Presence of coronary angioplasty implant and graft: Secondary | ICD-10-CM | POA: Insufficient documentation

## 2022-04-17 DIAGNOSIS — Z87891 Personal history of nicotine dependence: Secondary | ICD-10-CM | POA: Diagnosis not present

## 2022-04-17 DIAGNOSIS — C679 Malignant neoplasm of bladder, unspecified: Secondary | ICD-10-CM | POA: Diagnosis present

## 2022-04-17 DIAGNOSIS — D62 Acute posthemorrhagic anemia: Secondary | ICD-10-CM | POA: Diagnosis not present

## 2022-04-17 DIAGNOSIS — Z8546 Personal history of malignant neoplasm of prostate: Secondary | ICD-10-CM | POA: Diagnosis not present

## 2022-04-17 DIAGNOSIS — R310 Gross hematuria: Secondary | ICD-10-CM

## 2022-04-17 DIAGNOSIS — Z7982 Long term (current) use of aspirin: Secondary | ICD-10-CM | POA: Diagnosis not present

## 2022-04-17 DIAGNOSIS — I2510 Atherosclerotic heart disease of native coronary artery without angina pectoris: Secondary | ICD-10-CM | POA: Diagnosis present

## 2022-04-17 DIAGNOSIS — D72829 Elevated white blood cell count, unspecified: Secondary | ICD-10-CM | POA: Diagnosis not present

## 2022-04-17 DIAGNOSIS — R600 Localized edema: Secondary | ICD-10-CM

## 2022-04-17 DIAGNOSIS — I129 Hypertensive chronic kidney disease with stage 1 through stage 4 chronic kidney disease, or unspecified chronic kidney disease: Secondary | ICD-10-CM | POA: Insufficient documentation

## 2022-04-17 DIAGNOSIS — Z8551 Personal history of malignant neoplasm of bladder: Secondary | ICD-10-CM | POA: Insufficient documentation

## 2022-04-17 DIAGNOSIS — R339 Retention of urine, unspecified: Principal | ICD-10-CM | POA: Diagnosis present

## 2022-04-17 DIAGNOSIS — N189 Chronic kidney disease, unspecified: Secondary | ICD-10-CM | POA: Insufficient documentation

## 2022-04-17 DIAGNOSIS — D649 Anemia, unspecified: Secondary | ICD-10-CM

## 2022-04-17 DIAGNOSIS — R2242 Localized swelling, mass and lump, left lower limb: Secondary | ICD-10-CM | POA: Diagnosis not present

## 2022-04-17 DIAGNOSIS — N179 Acute kidney failure, unspecified: Secondary | ICD-10-CM

## 2022-04-17 DIAGNOSIS — R319 Hematuria, unspecified: Secondary | ICD-10-CM

## 2022-04-17 DIAGNOSIS — C674 Malignant neoplasm of posterior wall of bladder: Secondary | ICD-10-CM

## 2022-04-17 DIAGNOSIS — I10 Essential (primary) hypertension: Secondary | ICD-10-CM

## 2022-04-17 LAB — CBC WITH DIFFERENTIAL/PLATELET
Abs Immature Granulocytes: 0.06 10*3/uL (ref 0.00–0.07)
Basophils Absolute: 0 10*3/uL (ref 0.0–0.1)
Basophils Relative: 0 %
Eosinophils Absolute: 0 10*3/uL (ref 0.0–0.5)
Eosinophils Relative: 0 %
HCT: 27.3 % — ABNORMAL LOW (ref 39.0–52.0)
Hemoglobin: 9.1 g/dL — ABNORMAL LOW (ref 13.0–17.0)
Immature Granulocytes: 1 %
Lymphocytes Relative: 3 %
Lymphs Abs: 0.4 10*3/uL — ABNORMAL LOW (ref 0.7–4.0)
MCH: 32.6 pg (ref 26.0–34.0)
MCHC: 33.3 g/dL (ref 30.0–36.0)
MCV: 97.8 fL (ref 80.0–100.0)
Monocytes Absolute: 0.8 10*3/uL (ref 0.1–1.0)
Monocytes Relative: 6 %
Neutro Abs: 11.1 10*3/uL — ABNORMAL HIGH (ref 1.7–7.7)
Neutrophils Relative %: 90 %
Platelets: 235 10*3/uL (ref 150–400)
RBC: 2.79 MIL/uL — ABNORMAL LOW (ref 4.22–5.81)
RDW: 13.7 % (ref 11.5–15.5)
WBC: 12.4 10*3/uL — ABNORMAL HIGH (ref 4.0–10.5)
nRBC: 0 % (ref 0.0–0.2)

## 2022-04-17 LAB — IRON AND TIBC
Iron: 21 ug/dL — ABNORMAL LOW (ref 45–182)
Saturation Ratios: 11 % — ABNORMAL LOW (ref 17.9–39.5)
TIBC: 186 ug/dL — ABNORMAL LOW (ref 250–450)
UIBC: 165 ug/dL

## 2022-04-17 LAB — URINALYSIS, ROUTINE W REFLEX MICROSCOPIC
RBC / HPF: >50 RBC/hpf (ref 0–5)
Specific Gravity, Urine: 1.015 (ref 1.005–1.030)
Squamous Epithelial / HPF: NONE SEEN /HPF (ref 0–5)
WBC, UA: >50 WBC/hpf (ref 0–5)

## 2022-04-17 LAB — BASIC METABOLIC PANEL
Anion gap: 10 (ref 5–15)
BUN: 63 mg/dL — ABNORMAL HIGH (ref 8–23)
CO2: 18 mmol/L — ABNORMAL LOW (ref 22–32)
Calcium: 8.2 mg/dL — ABNORMAL LOW (ref 8.9–10.3)
Chloride: 97 mmol/L — ABNORMAL LOW (ref 98–111)
Creatinine, Ser: 2.93 mg/dL — ABNORMAL HIGH (ref 0.61–1.24)
GFR, Estimated: 21 mL/min — ABNORMAL LOW (ref >60)
Glucose, Bld: 135 mg/dL — ABNORMAL HIGH (ref 70–99)
Potassium: 3.9 mmol/L (ref 3.5–5.1)
Sodium: 125 mmol/L — ABNORMAL LOW (ref 135–145)

## 2022-04-17 LAB — FERRITIN: Ferritin: 338 ng/mL — ABNORMAL HIGH (ref 24–336)

## 2022-04-17 MED ORDER — SODIUM CHLORIDE 0.9 % IV SOLN
1.0000 g | INTRAVENOUS | Status: DC
  Administered 2022-04-18: 1 g via INTRAVENOUS
  Filled 2022-04-17: qty 1

## 2022-04-17 MED ORDER — SODIUM CHLORIDE 0.9 % IV SOLN: Freq: Once | INTRAVENOUS | Status: AC

## 2022-04-17 MED ORDER — ORAL CARE MOUTH RINSE: 15.0000 mL | OROMUCOSAL | Status: DC | PRN

## 2022-04-17 MED ORDER — ONDANSETRON HCL 4 MG PO TABS: 4.0000 mg | ORAL_TABLET | Freq: Four times a day (QID) | ORAL | Status: DC | PRN

## 2022-04-17 MED ORDER — ACETAMINOPHEN 650 MG RE SUPP: 650.0000 mg | Freq: Four times a day (QID) | RECTAL | Status: DC | PRN

## 2022-04-17 MED ORDER — ONDANSETRON HCL 4 MG/2ML IJ SOLN: 4.0000 mg | Freq: Four times a day (QID) | INTRAMUSCULAR | Status: DC | PRN

## 2022-04-17 MED ORDER — LIDOCAINE HCL URETHRAL/MUCOSAL 2 % EX GEL
1.0000 | Freq: Once | CUTANEOUS | Status: DC
  Filled 2022-04-17: qty 10

## 2022-04-17 MED ORDER — ASPIRIN 81 MG PO TBEC
81.0000 mg | DELAYED_RELEASE_TABLET | Freq: Every day | ORAL | Status: DC
  Administered 2022-04-18: 81 mg via ORAL
  Filled 2022-04-17: qty 1

## 2022-04-17 MED ORDER — ACETAMINOPHEN 325 MG PO TABS
650.0000 mg | ORAL_TABLET | Freq: Four times a day (QID) | ORAL | Status: DC | PRN
  Administered 2022-04-17: 650 mg via ORAL
  Filled 2022-04-17: qty 2

## 2022-04-17 MED ORDER — TAMSULOSIN HCL 0.4 MG PO CAPS
0.4000 mg | ORAL_CAPSULE | Freq: Every day | ORAL | Status: DC
  Administered 2022-04-18: 0.4 mg via ORAL
  Filled 2022-04-17: qty 1

## 2022-04-17 MED ORDER — LACTATED RINGERS IV BOLUS
1000.0000 mL | Freq: Once | INTRAVENOUS | Status: AC
  Administered 2022-04-17: 1000 mL via INTRAVENOUS

## 2022-04-17 MED ORDER — METOPROLOL TARTRATE 25 MG PO TABS
25.0000 mg | ORAL_TABLET | Freq: Two times a day (BID) | ORAL | Status: DC
  Administered 2022-04-17 – 2022-04-18 (×2): 25 mg via ORAL
  Filled 2022-04-17 (×2): qty 1

## 2022-04-17 MED ORDER — SODIUM CHLORIDE 0.9 % IV SOLN
1.0000 g | Freq: Once | INTRAVENOUS | Status: AC
  Administered 2022-04-17: 1 g via INTRAVENOUS
  Filled 2022-04-17: qty 10

## 2022-04-17 NOTE — Assessment & Plan Note (Addendum)
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  Renal ultrasound demonstrates mild to moderate hydronephrosis.  Foley catheter placed initially. Urology wanted to take out the Foley catheter and do a voiding trial on 3/1.Marland Kitchen  Creatinine improved from 2.93 down to 2.17.  Patient eating and drinking okay.  Patient was given IV fluids during the hospital course.  Creatinine as outpatient ranging between 1.5 and 1.6.  Recommend checking a BMP as outpatient.

## 2022-04-17 NOTE — ED Provider Notes (Signed)
[facility] Provider Note    Event Date/Time   First MD Initiated Contact with Patient 04/17/22 1208     (approximate)   History   Urinary Retention   HPI  [name] is a 77 y.o. male with an extensive urological history presents to the ER for evaluation of hematuria and retention since early this morning.  States he has had some bladder discomfort over the past few days states this morning he was passing clots and then 1 is unable to empty his bladder.  Rates the pain as moderate to severe.     Physical Exam   Triage Vital Signs: ED Triage Vitals [04/17/22 1027]  Enc Vitals Group     BP (!) 128/58     Pulse Rate 84     Resp 18     Temp 97.8 F (36.6 C)     Temp src      SpO2 97 %     Weight      Height      Head Circumference      Peak Flow      Pain Score 8     Pain Loc      Pain Edu?      Excl. in [place]?     Most recent vital signs: Vitals:   04/17/22 1430 04/17/22 1459  BP: 117/71   Pulse: 83   Resp: 16   Temp:  97.9 F (36.6 C)  SpO2: 94%      Constitutional: Alert  Eyes: Conjunctivae are normal.  Head: Atraumatic. Nose: No congestion/rhinnorhea. Mouth/Throat: Mucous membranes are moist.   Neck: Painless ROM.  Cardiovascular:   Good peripheral circulation. Respiratory: Normal respiratory effort.  No retractions.  Gastrointestinal: Soft and nontender.  Musculoskeletal:  no deformity Neurologic:  MAE spontaneously. No gross focal neurologic deficits are appreciated.  Skin:  Skin is warm, dry and intact. No rash noted. Psychiatric: Mood and affect are normal. Speech and behavior are normal.    ED Results / Procedures / Treatments   Labs (all labs ordered are listed, but only abnormal results are displayed) Labs Reviewed  URINALYSIS, ROUTINE W REFLEX MICROSCOPIC - Abnormal; Notable for the following components:      Result Value   Color, Urine RED (*)    APPearance BLOODY (*)    Glucose, UA   (*)    Value:  TEST NOT REPORTED DUE TO COLOR INTERFERENCE OF URINE PIGMENT   Hgb urine dipstick   (*)    Value: TEST NOT REPORTED DUE TO COLOR INTERFERENCE OF URINE PIGMENT   Bilirubin Urine   (*)    Value: TEST NOT REPORTED DUE TO COLOR INTERFERENCE OF URINE PIGMENT   Ketones, ur   (*)    Value: TEST NOT REPORTED DUE TO COLOR INTERFERENCE OF URINE PIGMENT   Protein, ur   (*)    Value: TEST NOT REPORTED DUE TO COLOR INTERFERENCE OF URINE PIGMENT   Nitrite   (*)    Value: TEST NOT REPORTED DUE TO COLOR INTERFERENCE OF URINE PIGMENT   Leukocytes,Ua   (*)    Value: TEST NOT REPORTED DUE TO COLOR INTERFERENCE OF URINE PIGMENT   Bacteria, UA MANY (*)    All other components within normal limits  CBC WITH DIFFERENTIAL/PLATELET - Abnormal; Notable for the following components:   WBC 12.4 (*)    RBC 2.79 (*)    Hemoglobin 9.1 (*)    HCT 27.3 (*)    Neutro  Abs 11.1 (*)    Lymphs Abs 0.4 (*)    All other components within normal limits  BASIC METABOLIC PANEL - Abnormal; Notable for the following components:   Sodium 125 (*)    Chloride 97 (*)    CO2 18 (*)    Glucose, Bld 135 (*)    BUN 63 (*)    Creatinine, Ser 2.93 (*)    Calcium 8.2 (*)    GFR, Estimated 21 (*)    All other components within normal limits  URINE CULTURE     EKG     RADIOLOGY EMERGENCY DEPARTMENT ULTRASOUND  Study: Limited Ultrasound of Bladder  INDICATIONS: to assess for urinary retention and/or bladder volume prior to urinary catheter Multiple views of the bladder were obtained in real-time in the transverse and longitudinal planes with a multi-frequency probe.  PERFORMED BY: Myself IMAGES ARCHIVED?: No LIMITATIONS:   INTERPRETATION: Large Volume        PROCEDURES:  Critical Care performed: No  Procedures   MEDICATIONS ORDERED IN ED: Medications  lidocaine (XYLOCAINE) 2 % jelly 1 Application (has no administration in time range)  cefTRIAXone (ROCEPHIN) 1 g in sodium chloride 0.9 % 100 mL IVPB (0 g  Intravenous Stopped 04/17/22 1418)  0.9 %  sodium chloride infusion ( Intravenous New Bag/Given 04/17/22 1406)     IMPRESSION / MDM / ASSESSMENT AND PLAN / ED COURSE  I reviewed the triage vital signs and the nursing notes.                              Differential diagnosis includes, but is not limited to, retention, AKI, prostatitis, mass, cystitis  Patient presenting to the ER for evaluation of symptoms as described above.  Based on symptoms, risk factors and considered above differential, this presenting complaint could reflect a potentially life-threatening illness therefore the patient will be placed on continuous pulse oximetry and telemetry for monitoring.  Laboratory evaluation will be sent to evaluate for the above complaints.     Clinical Course as of 04/17/22 1511  Thu Apr 17, 2022  1259 Foley catheter inserted for acute urinary retention with a large amount of clot burden.  I manually irrigated his bladder with 5 cc of sterile water with removal of moderate amount of clots.  Urine was initially [place] colored now turning to pink lemonade will continue observe. [PR]  1330 Urinalysis is consistent with UTI.  Will give dose of Rocephin. [PR]  1355 Urine will intermittently pass clot but overall is clearing.  Patient with significant AKI as well as hyponatremia leukocytosis in the setting of his many bacteria has received IV antibiotics.  Will receive IV fluids.  Wife states that over the past few days he has been more confused and is complaining of weakness.  Given his age risk factors presentation I do feel that admission the hospital is clinically indicated will consult hospitalist for admission. [PR]    Clinical Course User Index [PR] Merlyn Lot, MD     FINAL CLINICAL IMPRESSION(S) / ED DIAGNOSES   Final diagnoses:  Acute urinary retention  AKI (acute kidney injury) ([place])  Acute hyponatremia     Rx / DC Orders   ED Discharge Orders     None        Note:   This document was prepared using Dragon voice recognition software and may include unintentional dictation errors.    Merlyn Lot, MD 04/17/22 [phone]

## 2022-04-17 NOTE — Telephone Encounter (Signed)
Dr. Bernardo Heater notified patient is in ED.

## 2022-04-17 NOTE — ED Notes (Signed)
Bladder scan performed and >227

## 2022-04-17 NOTE — Assessment & Plan Note (Addendum)
Sodium 125 on presentation came up to 136.  Hold Dyazide.

## 2022-04-17 NOTE — Assessment & Plan Note (Signed)
-   Continue home metoprolol - Hold home Maxide given AKI

## 2022-04-17 NOTE — Telephone Encounter (Signed)
Pt LM on triage line in tears stating that he is in agony, he states he has been up all night with no sleep walking in circles drinking warm water to "break a loose the globs of blood" in his bladder, he states that so far he has passed 8 "globs of blood" patient sounds tearful, he is requesting an urgent work in appointment, if we cannot see him he is ok to head to ER. Please advise.

## 2022-04-17 NOTE — Assessment & Plan Note (Addendum)
Hemoglobin 10.4 upon discharge with ferritin of 338 goes along with anemia of chronic disease.

## 2022-04-17 NOTE — Assessment & Plan Note (Addendum)
Treating empirically for urinary tract infection follow-up urine culture and adjust antibiotics as needed.  Patient given 2 doses of Rocephin here in the hospital.  Will discharge home on 12 days of Keflex.

## 2022-04-17 NOTE — Assessment & Plan Note (Signed)
-   Per urology, okay to continue home aspirin

## 2022-04-17 NOTE — ED Triage Notes (Addendum)
Pt comes with c/o urinary retention. Pt states his urinary track is blocked. Pt states this incident started last night at about 10:00. Pt states once every hour he pees a small amount and lots of blood. Pt states large clots.  Pt is not on thinners.   Family also states on Sunday pt had 4 episodes of needing to urinate and would be lots of blood.   Pt also states bladder tumors.

## 2022-04-17 NOTE — Assessment & Plan Note (Addendum)
Patient had Foley catheter placed and irrigated.  Hematuria had cleared up.  Urology wanted to do a voiding trial.  Patient urinated a few times prior to disposition.

## 2022-04-17 NOTE — ED Notes (Signed)
Informed RN bed assigned 

## 2022-04-17 NOTE — Assessment & Plan Note (Signed)
Patient has a history of recurrent urothelial carcinoma s/p multiple TURBT. Last dose of bladder Gemcitabine in August 2023.

## 2022-04-17 NOTE — Assessment & Plan Note (Addendum)
Ultrasound negative for DVT.

## 2022-04-17 NOTE — Assessment & Plan Note (Addendum)
Likely secondary to patient's history of recurrent urothelial carcinoma.  Patient has an outpatient cystoscopy on 3/28.

## 2022-04-17 NOTE — H&P (Addendum)
History and Physical    Patient: [name] DOB: 1945-07-30 DOA: 04/17/2022 DOS: the patient was seen and examined on 04/17/2022 PCP: Baxter Hire, MD  Patient coming from: Home  Chief Complaint:  Chief Complaint  Patient presents with   Urinary Retention   HPI: [name] is a 77 y.o. male with medical history significant of recurrent urothelial carcinoma of the bladder, prostate malignancy s/p IMRT and ADT, BPH with LUTS on Silodosin, hypertension, CAD s/p CABG, hyperlipidemia, hypertension, who presents to the ED due to inability to urinate.  History obtained from both patient and his wife at bedside.  Mrs. Juneau states that on 04/13/2022, patient developed difficulty with urination and abdominal pain that was relieved by the passing of large clots, approximately 2 teaspoons full.  This happened intermittently throughout the day but symptoms gradual resolved.  Then on 04/15/2022, patient started passing large clots and having difficulty with urination when he was unable to pass them.  Last night at around 10 PM, patient felt that he was beginning to retain urine.  Since then, he has been unable to urinate despite straining.  He endorsed severe abdominal pain and anxiety.  At this time, patient denies any continued abdominal pain since catheter was placed.  He denies any shortness of breath, chest pain, palpitations, nausea, vomiting, diarrhea.  ED course: On arrival to the ED, patient was normotensive at 124/58 with heart rate of 84.  He was saturating at 97% on room air.  He was afebrile 97.8.  Initial workup remarkable for WBC of 12.4, hemoglobin of 9.1, platelets of 235, sodium of 125, chloride 97, bicarb 18, glucose 135, BUN 63, creatinine of 2.93 and GFR of 21.  Urinalysis was obtained that demonstrated frankly bloody appearance with many bacteria and over 50 WBC/hpf.  Urinary catheter was placed and bladder was irrigated with the passing of several large  clots.  TRH subsequently called for admission.  Review of Systems: As mentioned in the history of present illness. All other systems reviewed and are negative.  Past Medical History:  Diagnosis Date   Bladder cancer ([place])    Coronary artery disease    a.) LHC 04/12/2002 --> LVEF 42%; severe 3v CAD. 4v CABG on 04/13/2002. b.) LHC 02/03/2013 --> LVEF 50%; severe 3v CAD with patent LIMA-LAD and SVG-OM1 grafts; SVG-PDA and SVG-DG1 occluded with collaterals to PDA and DG1 from LAD.   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   a.) LIMA-LAD, SVG-OM1, SVG-D1, SVG-PDA   Tubular adenoma of colon 02/03/2007   Past Surgical History:  Procedure Laterality Date   CARDIAC CATHETERIZATION Left 04/13/2002   Procedure: CARDIAC CATHETERIZATION; Location: [place]; Surgeon; Isaias Cowman, MD   CARDIAC CATHETERIZATION Left 02/03/2013   Procedure: CARDIAC CATHETERIZATION; Location: [place]; Surgeon: Isaias Cowman, MD   COLONOSCOPY W/ POLYPECTOMY     COLONOSCOPY WITH PROPOFOL N/A 04/09/2015   Procedure: COLONOSCOPY WITH PROPOFOL;  Surgeon: Hulen Luster, MD;  Location: [facility] END[facility]COPY;  Service: Gastroenterology;  Laterality: N/A;   CORONARY ANGIOPLASTY     CORONARY ARTERY BYPASS GRAFT N/A 04/13/2002   Procedure: 4v CABG (LIMA-LAD, SVG-OM1, SVG-D1, SVG-PDA); Location: [facility]; Surgeon: Corinna Capra, MD  CYSTOSCOPY N/A 07/17/2021   Procedure: CYSTOSCOPY;  Surgeon: Abbie Sons, MD;  Location: [facility] ORS;  Service: Urology;  Laterality: N/A;   CYSTOSCOPY WITH BIOPSY N/A 07/17/2021   Procedure: CYSTOSCOPY WITH BIOPSY;  Surgeon: Abbie Sons, MD;  Location: [facility] ORS;  Service: Urology;  Laterality: N/A;   CYSTOSCOPY WITH FULGERATION  N/A 07/17/2021   Procedure: CYSTOSCOPY WITH FULGERATION;  Surgeon: Abbie Sons, MD;  Location: [facility] ORS;  Service: Urology;  Laterality: N/A;   TONSILLECTOMY     TRANSURETHRAL RESECTION OF BLADDER TUMOR N/A 11/13/2020   Procedure: TRANSURETHRAL RESECTION OF BLADDER TUMOR (TURBT);  Surgeon: Abbie Sons, MD;  Location: [facility] ORS;  Service: Urology;  Laterality: N/A;   TRANSURETHRAL RESECTION OF PROSTATE     Social History:  reports that he quit smoking about 16 years ago. His smoking use included cigarettes. He has a 45.00 pack-year smoking history. He has been exposed to tobacco smoke. He has never used smokeless tobacco. He reports current alcohol use of about 2.0 standard drinks of alcohol per week. He reports that he does not use drugs.  Allergies  Allergen Reactions   Cidex [Glutaral] Anaphylaxis    Family History  Problem Relation Age of Onset   Prostate cancer Father        Metastatic   Cancer Father     Prior to Admission medications   Medication Sig Start Date End Date Taking? Authorizing Provider  acetaminophen (TYLENOL) 500 MG tablet Take 500 mg by mouth every 6 (six) hours as needed.    [provider]  aspirin 81 MG tablet Take 81 mg by mouth daily.    [provider]  atorvastatin (LIPITOR) 10 MG tablet Take 1 tablet by mouth daily. 02/13/21 03/26/22  [provider]  b complex vitamins capsule Take 1 capsule by mouth daily.    [provider]  cetirizine (ZYRTEC) 10 MG tablet Take 10 mg by mouth at bedtime.    [provider]  Cholecalciferol (VITAMIN D3 PO) Take 1,000 Units by mouth daily.    [provider]  Coenzyme Q10 100 MG capsule Take 100 mg by mouth daily.     [provider]  fluticasone (FLONASE) 50 MCG/ACT nasal spray Place 1 spray into both nostrils at bedtime.    [provider]  GLUCOSAMINE-CHONDROITIN PO Take 1 tablet by mouth 2 (two) times daily.    [provider]   Magnesium 200 MG TABS Take 200 mg by mouth 2 (two) times daily.    [provider]  metoprolol tartrate (LOPRESSOR) 25 MG tablet Take 25 mg by mouth 2 (two) times daily.    [provider]  metroNIDAZOLE (METROGEL) 0.75 % gel Apply 1 application topically 2 (two) times daily.    [provider]  Multiple Vitamin (MULTIVITAMIN) tablet Take 1 tablet by mouth daily.    [provider]  naproxen sodium (ANAPROX) 220 MG tablet Take 220 mg by mouth daily as needed (pain).    [provider]  nitroGLYCERIN (NITROSTAT) 0.4 MG SL tablet Place under the tongue. 08/21/21   [provider]  Omega-3 Fatty Acids (FISH OIL) 1000 MG CAPS Take 1,000 mg by mouth daily.    [provider]  Potassium Gluconate 595 MG CAPS Take 1 capsule by mouth daily as needed (After HT TR exercise).    [provider]  Probiotic Product (MEGA PROBIOTIC) CAPS Take 1 capsule by mouth daily. 07/20/19   [provider]  sildenafil (  VIAGRA) 100 MG tablet Take 100 mg by mouth daily as needed. 06/17/21   [provider]  silodosin (RAPAFLO) 8 MG CAPS capsule Take 1 capsule (8 mg total) by mouth daily with breakfast. 03/26/22   Vaillancourt, Aldona Bar, PA-C  triamterene-hydrochlorothiazide (MAXZIDE-25) 37.5-25 MG tablet Take 1 tablet by mouth daily.    [provider]  Vibegron (GEMTESA) 75 MG TABS Take 1 tablet (75 mg total) by mouth daily. 03/26/22   Debroah Loop, PA-C  Vitamin A 2400 MCG (8000 UT) CAPS Take 8,000 Units by mouth daily.    [provider]  vitamin B-12 (CYANOCOBALAMIN) 1000 MCG tablet Take 1,000 mcg by mouth daily.    [provider]  vitamin C (ASCORBIC ACID) 250 MG tablet Take 250 mg by mouth 2 (two) times daily.    [provider]  zinc gluconate 50 MG tablet Take 50 mg by mouth daily.    [provider]    Physical Exam: Vitals:   04/17/22 1219 04/17/22 1430 04/17/22 1459 04/17/22  1500  BP: (!) 143/71 117/71  117/68  Pulse: 94 83  79  Resp: 18 16    Temp:   97.9 F (36.6 C)   TempSrc:   Axillary   SpO2: 96% 94%  97%   Physical Exam Vitals and nursing note reviewed.  Constitutional:      General: He is not in acute distress.    Appearance: He is normal weight. He is not toxic-appearing.  HENT:     Head: Normocephalic.     Mouth/Throat:     Mouth: Mucous membranes are dry.     Pharynx: Oropharynx is clear.  Eyes:     Extraocular Movements: Extraocular movements intact.     Conjunctiva/sclera: Conjunctivae normal.     Pupils: Pupils are equal, round, and reactive to light.  Cardiovascular:     Rate and Rhythm: Normal rate and regular rhythm.     Heart sounds: No murmur heard.    No gallop.  Pulmonary:     Effort: Pulmonary effort is normal. No respiratory distress.     Breath sounds: Normal breath sounds. No wheezing, rhonchi or rales.  Abdominal:     General: Bowel sounds are normal. There is no distension.     Palpations: Abdomen is soft.     Tenderness: There is no abdominal tenderness. There is no guarding.  Genitourinary:    Comments: Foley bag at bedside with blood-tinged urine Musculoskeletal:     Right lower leg: No edema.     Left lower leg: No edema.     Comments: Slightly increased size and erythema to the left lower extremity compared to the right  Skin:    General: Skin is warm and dry.  Neurological:     Mental Status: He is alert and oriented to person, place, and time.     Comments: Patient's wife states patient is currently at baseline  Psychiatric:        Mood and Affect: Mood normal.        Speech: Speech is tangential.        Behavior: Behavior normal.    Data Reviewed: CBC with WBC of 12.4, hemoglobin, 9.1 and platelets of 235.  BMP with sodium of 125, potassium 3.9, bicarb 18, glucose 135, BUN 63 Urinalysis with bloody output, many bacteria, > 50 WBC/hpf, and > 50 RBC/hpf.   US RENAL  Result Date: 04/17/2022 CLINICAL  DATA:  Acute kidney injury. EXAM: RENAL / URINARY TRACT ULTRASOUND COMPLETE  COMPARISON:  Abdominal CTA 07/28/2019 FINDINGS: Right Kidney: Renal measurements: 12.3 x 10.3 x 5.7 cm = volume: 232 mL. Mild to moderate hydronephrosis. Normal renal parenchymal echogenicity. Septated cyst in the mid lateral kidney measuring 3.2 x 3.1 x 2.7 cm. No septal thickening or nodularity. Exophytic cyst arising from the lower pole measures 1.7 x 1.9 x 1.4 cm. Left Kidney: Renal measurements: 11.6 x 7 x 6.2 cm = volume: 264 mL. Mild to moderate hydronephrosis. Normal renal parenchymal echogenicity. Upper pole cyst measures 5.4 x 4.9 x 3.8 cm. Mid lower pole cyst measures 1.9 x 1.8 x 1.7 cm. Bladder: Decompressed by Foley catheter and not assessed. Other: None. IMPRESSION: 1. Mild to moderate bilateral hydronephrosis. 2. Bilateral renal cysts, needing no specific imaging follow-up. Electronically Signed   By: Keith Rake M.D.   On: 04/17/2022 16:25    Results are pending, will review when available.  Assessment and Plan:  * Urinary retention Patient is presenting with acute urinary retention in the setting of severe hematuria and passing of blood clots.  Urinalysis with bacteria and elevated WBC; given placement of catheter and need for it for prolonged time, will cover for urinary bacteria.  - Urology consulted; appreciate their recommendations - Continue Foley catheter - Strict in and out - Continue ceftriaxone - Per urology, continue Sildosin and hold gemtesa  Hematuria Likely secondary to patient's history of recurrent urothelial carcinoma.  -Urology following; appreciate their recommendations  Acute kidney injury superimposed on chronic kidney disease ([place]) Secondary to obstruction.  Renal ultrasound demonstrates mild to moderate hydronephrosis.  - Continue Foley catheter - Repeat BMP in the a.m. - Strict in and out - 1 L of LR ordered - Avoid nephrotoxic agents  Hyponatremia Suspected to be  secondary to AKI.  Initial concern that patient was confused on presentation, however patient's wife states he is currently at baseline.  -Repeat BMP in the a.m.  Acute on chronic anemia Per chart review, patient's hemoglobin approximately 4 weeks ago was 10.9.  Currently 9.9 likely due to significant hematuria.  No indication for transfusion at this time.  - CBC in the a.m. - Iron panel pending - Transfuse for hemoglobin less than 7  Malignant neoplasm of bladder, unspecified (HCC) Patient has a history of recurrent urothelial carcinoma s/p multiple TURBT. Last dose of bladder Gemcitabine in August 2023.  Coronary artery disease involving native heart without angina pectoris - Per urology, okay to continue home aspirin  Essential hypertension - Continue home metoprolol - Hold home Maxide given AKI  Leg edema, left On exam, patient's left leg is slightly enlarged compared to the right, nonpitting in addition to slightly increased erythema.  Not consistent with cellulitis, however given history of recurrent cancer, will rule out DVT.  - Left lower extremity Doppler pending  Leukocytosis Chronic and unchanged compared to prior CBC in earlier February  Advance Care Planning:   Code Status: Full Code Verified by patient and his wife.   Consults: Urology  Family Communication: Patient's wife updated at bedside  Severity of Illness: The appropriate patient status for this patient is OBSERVATION. Observation status is judged to be reasonable and necessary in order to provide the required intensity of service to ensure the patient's safety. The patient's presenting symptoms, physical exam findings, and initial radiographic and laboratory data in the context of their medical condition is felt to place them at decreased risk for further clinical deterioration. Furthermore, it is anticipated that the patient will be medically stable for discharge from  the hospital within 2 midnights of  admission.   Author: Jose Persia, MD 04/17/2022 6:21 PM  For on call review [email or web address].

## 2022-04-17 NOTE — Consult Note (Signed)
Urology Consult   I have been asked to see the patient by Dr. Charleen Kirks, for evaluation and management of urinary retention and gross hematuria.  Chief Complaint: Gross hematuria and retention  HPI:  [name] is a 77 y.o. male who has been followed by Dr. Bernardo Heater for a number of urologic issues.  He has a long history of recurrent low-grade nonmuscle invasive bladder cancer, unfavorable intermediate risk prostate cancer treated with ADT and external beam radiation in 2020, who presents with gross hematuria and lower abdominal pain.  He has also had problems with recurrent UTIs as well as severe overactive symptoms and has intermittently been on both alpha blockers and Gemtesa, most recently was on silodosin and Gemtesa prior to this admission.  He developed gross hematuria 1 week ago that has been intermittent, but worsened today with large clots that he was unable to pass with lower abdominal pain.  Urinalysis in the ER was suspicious for infection, a 16 [place] two-way Foley was placed and irrigated to pink with relief of his pain.  Most recent cystoscopy was with Dr. Bernardo Heater on 02/19/2022 and showed moderate enlargement of the prostate, suboptimal visualization with cloudy urine, scattered hyperemia/erythema at the dome and posterior wall, but no definite tumors or lesions.  Urine culture ultimately grew E. coli and he was treated with antibiotics.  PMH: Past Medical History:  Diagnosis Date   Bladder cancer ([place])    Coronary artery disease    a.) LHC 04/12/2002 --> LVEF 42%; severe 3v CAD. 4v CABG on 04/13/2002. b.) LHC 02/03/2013 --> LVEF 50%; severe 3v CAD with patent LIMA-LAD and SVG-OM1 grafts; SVG-PDA and SVG-DG1 occluded with collaterals to PDA and DG1 from LAD.   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   a.) LIMA-LAD, SVG-OM1, SVG-D1, SVG-PDA   Tubular adenoma of colon 02/03/2007    Surgical History: Past Surgical History:  Procedure Laterality Date   CARDIAC CATHETERIZATION Left 04/13/2002   Procedure: CARDIAC CATHETERIZATION; Location: [place]; Surgeon; Isaias Cowman, MD   CARDIAC CATHETERIZATION Left 02/03/2013   Procedure: CARDIAC CATHETERIZATION; Location: [place]; Surgeon: Isaias Cowman, MD   COLONOSCOPY W/ POLYPECTOMY     COLONOSCOPY WITH PROPOFOL N/A 04/09/2015   Procedure: COLONOSCOPY WITH PROPOFOL;  Surgeon: Hulen Luster, MD;  Location: [facility] ENDOSCOPY;  Service: Gastroenterology;  Laterality: N/A;   CORONARY ANGIOPLASTY     CORONARY ARTERY BYPASS GRAFT N/A 04/13/2002   Procedure: 4v CABG (LIMA-LAD, SVG-OM1, SVG-D1, SVG-PDA); Location: [facility]; Surgeon: Corinna Capra, MD   CYSTOSCOPY N/A 07/17/2021   Procedure: Consuela Mimes;  Surgeon: Abbie Sons, MD;  Location: [facility] ORS;  Service: Urology;  Laterality: N/A;   CYSTOSCOPY WITH BIOPSY N/A 07/17/2021   Procedure: CYSTOSCOPY WITH BIOPSY;  Surgeon: Abbie Sons, MD;  Location: [facility] ORS;  Service: Urology;  Laterality: N/A;   CYSTOSCOPY WITH FULGERATION N/A 07/17/2021   Procedure: CYSTOSCOPY WITH FULGERATION;  Surgeon: Abbie Sons, MD;  Location: [facility] ORS;  Service: Urology;  Laterality: N/A;   TONSILLECTOMY     TRANSURETHRAL RESECTION OF BLADDER TUMOR N/A 11/13/2020   Procedure: TRANSURETHRAL RESECTION OF BLADDER TUMOR (TURBT);  Surgeon: Abbie Sons, MD;  Location:  [facility] ORS;  Service: Urology;  Laterality: N/A;   TRANSURETHRAL RESECTION OF PROSTATE       Allergies:  Allergies  Allergen Reactions   Cidex [Glutaral] Anaphylaxis    Family History: Family History  Problem Relation Age of Onset   Prostate cancer Father        Metastatic   Cancer Father     Social History:  reports that he quit  smoking about 16 years ago. His smoking use included cigarettes. He has a 45.00 pack-year smoking history. He has been exposed to tobacco smoke. He has never used smokeless tobacco. He reports current alcohol use of about 2.0 standard drinks of alcohol per week. He reports that he does not use drugs.  ROS: Negative aside from those stated in the HPI.  Physical Exam: BP 117/68   Pulse 79   Temp 97.9 F (36.6 C) (Axillary)   Resp 16   SpO2 97%    Constitutional:  Alert and oriented, No acute distress. Cardiovascular: No clubbing, cyanosis, or edema. Respiratory: Normal respiratory effort, no increased work of breathing. GI: Abdomen is soft, nontender, nondistended, no abdominal masses 16 French Foley with light pink urine  Foley catheter was irrigated with 1 L of sterile water with return of minimal clots and remained light pink.  Patient did not tolerate irrigation well secondary to bladder spasms which limited aggressiveness of irrigation.  Laboratory Data: Reviewed in epic  Pertinent Imaging: I have personally reviewed the renal ultrasound showing bilateral hydronephrosis.  Assessment & Plan:   77 year old male with a number of urologic issues including history of recurrent low-grade nonmuscle invasive bladder cancer, unfavorable intermediate risk prostate cancer treated with radiation in 2020, overactive urinary symptoms, recurrent UTIs, and most recently incomplete bladder emptying on Gemtesa.  We reviewed possible etiologies of his gross hematuria and retention including infection, radiation cystitis, recurrence of bladder cancer.  With his reportedly normal cystoscopy from January 2024, I think infection and radiation cystitis are the most likely etiologies.  May benefit from outpatient hyperbaric oxygen treatment in the future which potentially would improve his recurrent gross hematuria as well as bladder symptoms from likely radiation cystitis.  AKI with creatinine of 2.93,  EGFR of 21 from a baseline of 1.0, suspect this is from incomplete emptying with upstream hydronephrosis confirmed on ultrasound today, and will improve with Foley catheter drainage.  Recommendations: -Continue ceftriaxone, follow-up urine culture, recommend 2-week duration -Maintain Foley, okay to hand irrigate as needed.  If unable to irrigate Foley could consider upsizing catheter. -Continue Silodosin, hold Gemtesa -Okay to continue 81 mg aspirin with his cardiac history -Urology will continue to follow   Billey Co, MD  Total time spent on the floor was 65 minutes, with greater than 50% spent in counseling and coordination of care with the patient regarding gross hematuria, retention and multiple possible urologic causes including UTI, radiation cystitis, or bladder cancer.  [place] [address] [place], [place] [address] [phone]

## 2022-04-17 NOTE — Telephone Encounter (Signed)
Pt called stating he is in the ED.

## 2022-04-18 ENCOUNTER — Observation Stay: Payer: Medicare Other

## 2022-04-18 DIAGNOSIS — D72829 Elevated white blood cell count, unspecified: Secondary | ICD-10-CM

## 2022-04-18 DIAGNOSIS — E871 Hypo-osmolality and hyponatremia: Secondary | ICD-10-CM

## 2022-04-18 DIAGNOSIS — R310 Gross hematuria: Secondary | ICD-10-CM

## 2022-04-18 DIAGNOSIS — D649 Anemia, unspecified: Secondary | ICD-10-CM

## 2022-04-18 DIAGNOSIS — R600 Localized edema: Secondary | ICD-10-CM

## 2022-04-18 DIAGNOSIS — Z8551 Personal history of malignant neoplasm of bladder: Secondary | ICD-10-CM | POA: Diagnosis not present

## 2022-04-18 DIAGNOSIS — R339 Retention of urine, unspecified: Secondary | ICD-10-CM | POA: Diagnosis not present

## 2022-04-18 DIAGNOSIS — N179 Acute kidney failure, unspecified: Secondary | ICD-10-CM | POA: Diagnosis not present

## 2022-04-18 DIAGNOSIS — N189 Chronic kidney disease, unspecified: Secondary | ICD-10-CM

## 2022-04-18 DIAGNOSIS — C679 Malignant neoplasm of bladder, unspecified: Secondary | ICD-10-CM

## 2022-04-18 DIAGNOSIS — I10 Essential (primary) hypertension: Secondary | ICD-10-CM

## 2022-04-18 LAB — CBC
HCT: 31.9 % — ABNORMAL LOW (ref 39.0–52.0)
Hemoglobin: 10.4 g/dL — ABNORMAL LOW (ref 13.0–17.0)
MCH: 32.5 pg (ref 26.0–34.0)
MCHC: 32.6 g/dL (ref 30.0–36.0)
MCV: 99.7 fL (ref 80.0–100.0)
Platelets: 237 10*3/uL (ref 150–400)
RBC: 3.2 MIL/uL — ABNORMAL LOW (ref 4.22–5.81)
RDW: 13.8 % (ref 11.5–15.5)
WBC: 21.6 10*3/uL — ABNORMAL HIGH (ref 4.0–10.5)
nRBC: 0 % (ref 0.0–0.2)

## 2022-04-18 LAB — BASIC METABOLIC PANEL
Anion gap: 10 (ref 5–15)
BUN: 48 mg/dL — ABNORMAL HIGH (ref 8–23)
CO2: 19 mmol/L — ABNORMAL LOW (ref 22–32)
Calcium: 8.5 mg/dL — ABNORMAL LOW (ref 8.9–10.3)
Chloride: 107 mmol/L (ref 98–111)
Creatinine, Ser: 2.17 mg/dL — ABNORMAL HIGH (ref 0.61–1.24)
GFR, Estimated: 30 mL/min — ABNORMAL LOW (ref >60)
Glucose, Bld: 138 mg/dL — ABNORMAL HIGH (ref 70–99)
Potassium: 4.1 mmol/L (ref 3.5–5.1)
Sodium: 136 mmol/L (ref 135–145)

## 2022-04-18 MED ORDER — CEPHALEXIN 500 MG PO CAPS: 500.0000 mg | ORAL_CAPSULE | Freq: Three times a day (TID) | ORAL | 0 refills | Status: AC

## 2022-04-18 NOTE — Progress Notes (Signed)
Pt discharged. Instructions and education provided. Addressed all of patient's concerns as best as possible.

## 2022-04-18 NOTE — Progress Notes (Signed)
Urology Inpatient Progress Note  Subjective: No acute events overnight.  He is afebrile, VSS. Creatinine down today, 2.17.  WBC count up, 21.6.  Hemoglobin up, 10.4.  Urine culture pending, on antibiotics as below. Foley catheter in place draining clear, yellow urine. He denies pain or history of acute urinary retention.  Anti-infectives: Anti-infectives (From admission, onward)    Start     Dose/Rate Route Frequency Ordered Stop   04/18/22 1000  cefTRIAXone (ROCEPHIN) 1 g in sodium chloride 0.9 % 100 mL IVPB        1 g 200 mL/hr over 30 Minutes Intravenous Every 24 hours 04/17/22 1809     04/17/22 1330  cefTRIAXone (ROCEPHIN) 1 g in sodium chloride 0.9 % 100 mL IVPB        1 g 200 mL/hr over 30 Minutes Intravenous  Once 04/17/22 1323 04/17/22 1418      Current Facility-Administered Medications  Medication Dose Route Frequency Provider Last Rate Last Admin   acetaminophen (TYLENOL) tablet 650 mg  650 mg Oral Q6H PRN Jose Persia, MD   650 mg at 04/17/22 2049   Or   acetaminophen (TYLENOL) suppository 650 mg  650 mg Rectal Q6H PRN Jose Persia, MD       aspirin EC tablet 81 mg  81 mg Oral Daily Jose Persia, MD       cefTRIAXone (ROCEPHIN) 1 g in sodium chloride 0.9 % 100 mL IVPB  1 g Intravenous Q24H Jose Persia, MD       lidocaine (XYLOCAINE) 2 % jelly 1 Application  1 Application Urethral Once Merlyn Lot, MD       metoprolol tartrate (LOPRESSOR) tablet 25 mg  25 mg Oral BID Jose Persia, MD   25 mg at 04/17/22 2049   ondansetron (ZOFRAN) tablet 4 mg  4 mg Oral Q6H PRN Jose Persia, MD       Or   ondansetron (ZOFRAN) injection 4 mg  4 mg Intravenous Q6H PRN Jose Persia, MD       Oral care mouth rinse  15 mL Mouth Rinse PRN Jose Persia, MD       tamsulosin (FLOMAX) capsule 0.4 mg  0.4 mg Oral QPC breakfast Jose Persia, MD       Objective: Vital signs in last 24 hours: Temp:  [97.8 F (36.6 C)-100.1 F (37.8 C)] 98.3 F (36.8 C) (03/01  0500) Pulse Rate:  [79-108] 91 (03/01 0500) Resp:  [16-20] 18 (03/01 0500) BP: (116-143)/(53-71) 116/53 (03/01 0500) SpO2:  [94 %-97 %] 95 % (03/01 0500)  Intake/Output from previous day: 02/29 0701 - 03/01 0700 In: 660 [P.O.:660] Out: 3700 [Urine:3700] Intake/Output this shift: No intake/output data recorded.  Physical Exam Vitals and nursing note reviewed.  Constitutional:      General: He is not in acute distress.    Appearance: He is not ill-appearing, toxic-appearing or diaphoretic.  HENT:     Head: Normocephalic and atraumatic.  Pulmonary:     Effort: Pulmonary effort is normal. No respiratory distress.  Skin:    General: Skin is warm and dry.  Neurological:     Mental Status: He is alert and oriented to person, place, and time.  Psychiatric:        Mood and Affect: Mood normal.        Behavior: Behavior normal.     Lab Results:  Recent Labs    04/17/22 1315 04/18/22 0311  WBC 12.4* 21.6*  HGB 9.1* 10.4*  HCT 27.3* 31.9*  PLT 235  [place]    04/17/22 1315 04/18/22 0311  NA 125* 136  K 3.9 4.1  CL 97* 107  CO2 18* 19*  GLUCOSE 135* 138*  BUN 63* 48*  CREATININE 2.93* 2.17*  CALCIUM 8.2* 8.5*   Studies/Results: US Venous Img Lower Unilateral Left (DVT)  Result Date: 04/18/2022 CLINICAL DATA:  Edema EXAM: LEFT LOWER EXTREMITY VENOUS DOPPLER ULTRASOUND TECHNIQUE: Gray-scale sonography with compression, as well as color and duplex ultrasound, were performed to evaluate the deep venous system(s) from the level of the common femoral vein through the popliteal and proximal calf veins. COMPARISON:  None Available. FINDINGS: VENOUS Normal compressibility of the common femoral, superficial femoral, and popliteal veins, as well as the visualized calf veins. Visualized portions of profunda femoral vein and great saphenous vein unremarkable. No filling defects to suggest DVT on grayscale or color Doppler imaging. Doppler waveforms show normal direction  of venous flow, normal respiratory plasticity and response to augmentation. Limited views of the contralateral common femoral vein are unremarkable. OTHER None. Limitations: none IMPRESSION: Negative. Electronically Signed   By: Fidela Salisbury M.D.   On: 04/18/2022 01:26   US RENAL  Result Date: 04/17/2022 CLINICAL DATA:  Acute kidney injury. EXAM: RENAL / URINARY TRACT ULTRASOUND COMPLETE COMPARISON:  Abdominal CTA 07/28/2019 FINDINGS: Right Kidney: Renal measurements: 12.3 x 10.3 x 5.7 cm = volume: 232 mL. Mild to moderate hydronephrosis. Normal renal parenchymal echogenicity. Septated cyst in the mid lateral kidney measuring 3.2 x 3.1 x 2.7 cm. No septal thickening or nodularity. Exophytic cyst arising from the lower pole measures 1.7 x 1.9 x 1.4 cm. Left Kidney: Renal measurements: 11.6 x 7 x 6.2 cm = volume: 264 mL. Mild to moderate hydronephrosis. Normal renal parenchymal echogenicity. Upper pole cyst measures 5.4 x 4.9 x 3.8 cm. Mid lower pole cyst measures 1.9 x 1.8 x 1.7 cm. Bladder: Decompressed by Foley catheter and not assessed. Other: None. IMPRESSION: 1. Mild to moderate bilateral hydronephrosis. 2. Bilateral renal cysts, needing no specific imaging follow-up. Electronically Signed   By: Keith Rake M.D.   On: 04/17/2022 16:25    Assessment & Plan: 77 year old male with a complex urologic history including recurrent low-grade nonmuscle invasive bladder cancer, unfavorable intermediate risk prostate cancer s/p radiation in 2020, OAB, recurrent UTI, and incomplete bladder emptying on Gemtesa and silodosin currently admitted with gross hematuria and clot retention.  He is clinically improving on empiric antibiotics and his gross hematuria has resolved.  Will attempt a voiding trial today.  I will move up his next planned surveillance cystoscopy with Dr. Bernardo Heater.  May consider suppressive antibiotics versus hyperbaric oxygen in the future for management of likely hemorrhagic infectious  versus radiation cystitis, though recurrent bladder cancer remains on the differential.  Recommendations: -Continue empiric antibiotics and follow cultures for total of 2 weeks of culture appropriate therapy -Discontinue Foley catheter this morning and obtain postvoid bladder scan at least 4 hours later, will place orders for this -Continue silodosin, hold Gemtesa -Will reschedule outpatient cystoscopy  Debroah Loop, PA-C 04/18/2022

## 2022-04-18 NOTE — Progress Notes (Signed)
Foley removed. Tolerated well. Patient voided as soon as catheter removed.

## 2022-04-18 NOTE — Discharge Summary (Signed)
Physician Discharge Summary   Patient: [name] MRN: [id number] DOB: [birth year]  Admit date:     04/17/2022  Discharge date: 04/18/22  Discharge Physician: Loletha Grayer   PCP: Baxter Hire, MD   Recommendations at discharge:   Follow-up PCP 5 days Follow-up urology 1 week.  Has cystoscopy on 3/28.  Discharge Diagnoses: Principal Problem:   Urinary retention Active Problems:   Hematuria   Acute kidney injury superimposed on chronic kidney disease (HCC)   Hyponatremia   Acute on chronic anemia   Malignant neoplasm of bladder, unspecified (HCC)   Coronary artery disease involving native heart without angina pectoris   Essential hypertension   Leg edema, left   Leukocytosis    Hospital Course: 77 y.o. male with medical history significant of recurrent urothelial carcinoma of the bladder, prostate malignancy s/p IMRT and ADT, BPH with LUTS on Silodosin, hypertension, CAD s/p CABG, hyperlipidemia, hypertension, who presents to the ED due to inability to urinate.  History obtained from both patient and his wife at bedside.   Mrs. Zupon states that on 04/13/2022, patient developed difficulty with urination and abdominal pain that was relieved by the passing of large clots, approximately 2 teaspoons full.  This happened intermittently throughout the day but symptoms gradual resolved.  Then on 04/15/2022, patient started passing large clots and having difficulty with urination when he was unable to pass them.  Last night at around 10 PM, patient felt that he was beginning to retain urine.  Since then, he has been unable to urinate despite straining.  He endorsed severe abdominal pain and anxiety.   At this time, patient denies any continued abdominal pain since catheter was placed.  He denies any shortness of breath, chest pain, palpitations, nausea, vomiting, diarrhea.   ED course: On arrival to the ED, patient was normotensive at 124/58 with heart rate of 84.  He was  saturating at 97% on room air.  He was afebrile 97.8.  Initial workup remarkable for WBC of 12.4, hemoglobin of 9.1, platelets of 235, sodium of 125, chloride 97, bicarb 18, glucose 135, BUN 63, creatinine of 2.93 and GFR of 21.  Urinalysis was obtained that demonstrated frankly bloody appearance with many bacteria and over 50 WBC/hpf.  Urinary catheter was placed and bladder was irrigated with the passing of several large clots.  TRH subsequently called for admission.  3/1.  Hematuria had cleared up.  Urology saw patient and recommended a voiding trial.  Foley was taken out and patient was able to urinate a few times with postvoid residual being 150.  Urology was okay with discharge home with 2 weeks of antibiotics for urinary infection.  Urine culture still pending at this time.  Empiric Keflex prescribed.  Patient received 2 doses of Rocephin.  Creatinine down from 2.93 down to 2.17.  Patient was feeling well and wanted to go home.  Assessment and Plan: * Urinary retention Patient had Foley catheter placed and irrigated.  Hematuria had cleared up.  Urology wanted to do a voiding trial.  Patient urinated a few times prior to disposition.  Hematuria Likely secondary to patient's history of recurrent urothelial carcinoma.  Patient has an outpatient cystoscopy on 3/28.  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  Renal ultrasound demonstrates mild to moderate hydronephrosis.  Foley catheter placed initially. Urology wanted to take out the Foley catheter and do a voiding trial on 3/1.Marland Kitchen  Creatinine improved from 2.93  down to 2.17.  Patient eating and drinking okay.  Patient was given IV fluids during the hospital course.  Creatinine as outpatient ranging between 1.5 and 1.6.  Recommend checking a BMP as outpatient.  Hyponatremia Sodium 125 on presentation came up to 136.  Hold Dyazide.  Acute on chronic anemia Hemoglobin 10.4 upon  discharge with ferritin of 338 goes along with anemia of chronic disease.  Malignant neoplasm of bladder, unspecified ([place]) Patient has a history of recurrent urothelial carcinoma s/p multiple TURBT. Last dose of bladder Gemcitabine in August 2023.  Coronary artery disease involving native heart without angina pectoris - Per urology, okay to continue home aspirin  Essential hypertension - Continue home metoprolol - Hold home Maxide given AKI  Leg edema, left Ultrasound negative for DVT  Leukocytosis Treating empirically for urinary tract infection follow-up urine culture and adjust antibiotics as needed.  Patient given 2 doses of Rocephin here in the hospital.  Will discharge home on 12 days of Keflex.         Consultants: Urology Procedures performed: Foley catheter placement and removal Disposition: Home Diet recommendation:  Cardiac diet DISCHARGE MEDICATION: Allergies as of 04/18/2022       Reactions   Cidex [glutaral] Anaphylaxis        Medication List     STOP taking these medications    atorvastatin 10 MG tablet Commonly known as: LIPITOR   b complex vitamins capsule   Gemtesa 75 MG Tabs Generic drug: Vibegron   naproxen sodium 220 MG tablet Commonly known as: ALEVE   sildenafil 100 MG tablet Commonly known as: VIAGRA   triamterene-hydrochlorothiazide 37.5-25 MG tablet Commonly known as: MAXZIDE-25   VITAMIN D3 PO       TAKE these medications    acetaminophen 500 MG tablet Commonly known as: TYLENOL Take 500 mg by mouth every 6 (six) hours as needed.   aspirin 81 MG tablet Take 81 mg by mouth daily.   cephALEXin 500 MG capsule Commonly known as: KEFLEX Take 1 capsule (500 mg total) by mouth 3 (three) times daily for 12 days.   cetirizine 10 MG tablet Commonly known as: ZYRTEC Take 10 mg by mouth at bedtime.   Coenzyme Q10 100 MG capsule Take 100 mg by mouth daily.   cyanocobalamin 1000 MCG tablet Commonly known as: VITAMIN  B12 Take 1,000 mcg by mouth daily.   Fish Oil 1000 MG Caps Take 1,000 mg by mouth daily.   fluticasone 50 MCG/ACT nasal spray Commonly known as: FLONASE Place 1 spray into both nostrils at bedtime.   GLUCOSAMINE-CHONDROITIN PO Take 1 tablet by mouth 2 (two) times daily.   Magnesium 200 MG Tabs Take 200 mg by mouth 2 (two) times daily.   Mega Probiotic Caps Take 1 capsule by mouth daily.   metoprolol tartrate 25 MG tablet Commonly known as: LOPRESSOR Take 25 mg by mouth 2 (two) times daily.   metroNIDAZOLE 0.75 % gel Commonly known as: METROGEL Apply 1 application topically 2 (two) times daily.   multivitamin tablet Take 1 tablet by mouth daily.   nitroGLYCERIN 0.4 MG SL tablet Commonly known as: NITROSTAT Place under the tongue.   Potassium Gluconate 595 MG Caps Take 1 capsule by mouth daily as needed (After HT TR exercise).   silodosin 8 MG Caps capsule Commonly known as: RAPAFLO Take 1 capsule (8 mg total) by mouth daily with breakfast.   Vitamin A 2400 MCG (8000 UT) Caps Take 8,000 Units by mouth daily.   vitamin  C 250 MG tablet Commonly known as: ASCORBIC ACID Take 250 mg by mouth 2 (two) times daily.   zinc gluconate 50 MG tablet Take 50 mg by mouth daily.        Follow-up Information     Baxter Hire, MD Follow up in 5 day(s).   Specialty: Internal Medicine Contact information: [place] [address] [phone]         Abbie Sons, MD Follow up in 1 week(s).   Specialty: Urology Why: your cystoscopy is on 3/28 Contact information: [address] [place] [address] [phone]                Discharge Exam: Physical Exam HENT:     Head: Normocephalic.     Mouth/Throat:     Pharynx: No oropharyngeal exudate.  Eyes:     General: Lids are normal.     Conjunctiva/sclera: Conjunctivae normal.  Cardiovascular:     Rate and Rhythm: Normal rate and regular rhythm.     Heart  sounds: Normal heart sounds, S1 normal and S2 normal.  Pulmonary:     Breath sounds: No decreased breath sounds, wheezing, rhonchi or rales.  Abdominal:     Palpations: Abdomen is soft.     Tenderness: There is no abdominal tenderness.  Musculoskeletal:     Right lower leg: No swelling.     Left lower leg: No swelling.  Skin:    General: Skin is warm.     Findings: No rash.  Neurological:     Mental Status: He is alert and oriented to person, place, and time.      Condition at discharge: stable  The results of significant diagnostics from this hospitalization (including imaging, microbiology, ancillary and laboratory) are listed below for reference.   Imaging Studies: US Venous Img Lower Unilateral Left (DVT)  Result Date: 04/18/2022 CLINICAL DATA:  Edema EXAM: LEFT LOWER EXTREMITY VENOUS DOPPLER ULTRASOUND TECHNIQUE: Gray-scale sonography with compression, as well as color and duplex ultrasound, were performed to evaluate the deep venous system(s) from the level of the common femoral vein through the popliteal and proximal calf veins. COMPARISON:  None Available. FINDINGS: VENOUS Normal compressibility of the common femoral, superficial femoral, and popliteal veins, as well as the visualized calf veins. Visualized portions of profunda femoral vein and great saphenous vein unremarkable. No filling defects to suggest DVT on grayscale or color Doppler imaging. Doppler waveforms show normal direction of venous flow, normal respiratory plasticity and response to augmentation. Limited views of the contralateral common femoral vein are unremarkable. OTHER None. Limitations: none IMPRESSION: Negative. Electronically Signed   By: Fidela Salisbury M.D.   On: 04/18/2022 01:26   US RENAL  Result Date: 04/17/2022 CLINICAL DATA:  Acute kidney injury. EXAM: RENAL / URINARY TRACT ULTRASOUND COMPLETE COMPARISON:  Abdominal CTA 07/28/2019 FINDINGS: Right Kidney: Renal measurements: 12.3 x 10.3 x 5.7 cm =  volume: 232 mL. Mild to moderate hydronephrosis. Normal renal parenchymal echogenicity. Septated cyst in the mid lateral kidney measuring 3.2 x 3.1 x 2.7 cm. No septal thickening or nodularity. Exophytic cyst arising from the lower pole measures 1.7 x 1.9 x 1.4 cm. Left Kidney: Renal measurements: 11.6 x 7 x 6.2 cm = volume: 264 mL. Mild to moderate hydronephrosis. Normal renal parenchymal echogenicity. Upper pole cyst measures 5.4 x 4.9 x 3.8 cm. Mid lower pole cyst measures 1.9 x 1.8 x 1.7 cm. Bladder: Decompressed by Foley catheter and not assessed. Other: None.  IMPRESSION: 1. Mild to moderate bilateral hydronephrosis. 2. Bilateral renal cysts, needing no specific imaging follow-up. Electronically Signed   By: Keith Rake M.D.   On: 04/17/2022 16:25      Labs: CBC: Recent Labs  Lab 04/17/22 1315 04/18/22 0311  WBC 12.4* 21.6*  NEUTROABS 11.1*  --   HGB 9.1* 10.4*  HCT 27.3* 31.9*  MCV 97.8 99.7  PLT 235 [id number]   Basic Metabolic Panel: Recent Labs  Lab 04/17/22 1315 04/18/22 0311  NA 125* 136  K 3.9 4.1  CL 97* 107  CO2 18* 19*  GLUCOSE 135* 138*  BUN 63* 48*  CREATININE 2.93* 2.17*  CALCIUM 8.2* 8.5*     Discharge time spent: greater than 30 minutes.  Signed: Loletha Grayer, MD [facility] 04/18/2022

## 2022-04-18 NOTE — Hospital Course (Signed)
77 y.o. male with medical history significant of recurrent urothelial carcinoma of the bladder, prostate malignancy s/p IMRT and ADT, BPH with LUTS on Silodosin, hypertension, CAD s/p CABG, hyperlipidemia, hypertension, who presents to the ED due to inability to urinate.  History obtained from both patient and his wife at bedside.   Mrs. Aborn states that on 04/13/2022, patient developed difficulty with urination and abdominal pain that was relieved by the passing of large clots, approximately 2 teaspoons full.  This happened intermittently throughout the day but symptoms gradual resolved.  Then on 04/15/2022, patient started passing large clots and having difficulty with urination when he was unable to pass them.  Last night at around 10 PM, patient felt that he was beginning to retain urine.  Since then, he has been unable to urinate despite straining.  He endorsed severe abdominal pain and anxiety.   At this time, patient denies any continued abdominal pain since catheter was placed.  He denies any shortness of breath, chest pain, palpitations, nausea, vomiting, diarrhea.   ED course: On arrival to the ED, patient was normotensive at 124/58 with heart rate of 84.  He was saturating at 97% on room air.  He was afebrile 97.8.  Initial workup remarkable for WBC of 12.4, hemoglobin of 9.1, platelets of 235, sodium of 125, chloride 97, bicarb 18, glucose 135, BUN 63, creatinine of 2.93 and GFR of 21.  Urinalysis was obtained that demonstrated frankly bloody appearance with many bacteria and over 50 WBC/hpf.  Urinary catheter was placed and bladder was irrigated with the passing of several large clots.  [facility] subsequently called for admission.  3/1.  Hematuria had cleared up.  Urology saw patient and recommended a voiding trial.  Foley was taken out and patient was able to urinate a few times with postvoid residual being 150.  Urology was okay with discharge home with 2 weeks of antibiotics for urinary infection.   Urine culture still pending at this time.  Empiric Keflex prescribed.  Patient received 2 doses of Rocephin.  Creatinine down from 2.93 down to 2.17.  Patient was feeling well and wanted to go home.

## 2022-04-20 ENCOUNTER — Other Ambulatory Visit: Payer: Self-pay

## 2022-04-20 ENCOUNTER — Emergency Department
Admission: EM | Admit: 2022-04-20 | Discharge: 2022-04-20 | Disposition: A | Discharge status: Home / Self Care | Payer: Medicare Other | Attending: Emergency Medicine | Admitting: Emergency Medicine

## 2022-04-20 ENCOUNTER — Encounter: Payer: Self-pay | Admitting: Intensive Care

## 2022-04-20 DIAGNOSIS — Z8554 Personal history of malignant neoplasm of ureter: Secondary | ICD-10-CM | POA: Insufficient documentation

## 2022-04-20 DIAGNOSIS — R319 Hematuria, unspecified: Secondary | ICD-10-CM | POA: Insufficient documentation

## 2022-04-20 DIAGNOSIS — N189 Chronic kidney disease, unspecified: Secondary | ICD-10-CM | POA: Diagnosis not present

## 2022-04-20 DIAGNOSIS — R339 Retention of urine, unspecified: Secondary | ICD-10-CM | POA: Insufficient documentation

## 2022-04-20 DIAGNOSIS — R1030 Lower abdominal pain, unspecified: Secondary | ICD-10-CM | POA: Diagnosis not present

## 2022-04-20 LAB — URINE CULTURE: Culture: >=100000 — AB

## 2022-04-20 LAB — URINALYSIS, ROUTINE W REFLEX MICROSCOPIC
Bacteria, UA: NONE SEEN
RBC / HPF: >50 RBC/hpf (ref 0–5)
Specific Gravity, Urine: 1.011 (ref 1.005–1.030)
Specific Gravity, Urine: 1.011 (ref 1.005–1.030)
Squamous Epithelial / HPF: NONE SEEN /HPF (ref 0–5)
WBC, UA: >50 WBC/hpf (ref 0–5)

## 2022-04-20 LAB — BASIC METABOLIC PANEL
Anion gap: 11 (ref 5–15)
BUN: 49 mg/dL — ABNORMAL HIGH (ref 8–23)
CO2: 17 mmol/L — ABNORMAL LOW (ref 22–32)
Calcium: 8.4 mg/dL — ABNORMAL LOW (ref 8.9–10.3)
Chloride: 104 mmol/L (ref 98–111)
Creatinine, Ser: 2.31 mg/dL — ABNORMAL HIGH (ref 0.61–1.24)
GFR, Estimated: 28 mL/min — ABNORMAL LOW (ref >60)
Glucose, Bld: 113 mg/dL — ABNORMAL HIGH (ref 70–99)
Potassium: 3.9 mmol/L (ref 3.5–5.1)
Sodium: 132 mmol/L — ABNORMAL LOW (ref 135–145)

## 2022-04-20 LAB — CBC
HCT: 28.3 % — ABNORMAL LOW (ref 39.0–52.0)
Hemoglobin: 9.1 g/dL — ABNORMAL LOW (ref 13.0–17.0)
MCH: 32.4 pg (ref 26.0–34.0)
MCHC: 32.2 g/dL (ref 30.0–36.0)
MCV: 100.7 fL — ABNORMAL HIGH (ref 80.0–100.0)
Platelets: 283 10*3/uL (ref 150–400)
RBC: 2.81 MIL/uL — ABNORMAL LOW (ref 4.22–5.81)
RDW: 13.7 % (ref 11.5–15.5)
WBC: 12 10*3/uL — ABNORMAL HIGH (ref 4.0–10.5)
nRBC: 0 % (ref 0.0–0.2)

## 2022-04-20 NOTE — Discharge Instructions (Signed)
Continue to take your Keflex as prescribed.  Call your urologist for first thing in the morning and let them know that you were seen in the emergency department and needed a catheter for urinary retention.

## 2022-04-20 NOTE — ED Provider Notes (Signed)
-----------------------------------------   3:52 PM on 04/20/2022 ----------------------------------------- Patient's labs have resulted showing a white blood cell count of 12,000 on CBC although elevated this is significantly decreased from 2 days ago.  Anemia appears baseline for the patient.  Patient's chemistry shows renal insufficiency however this appears to be unchanged from historical values.  Patient is already on antibiotics.  We will have the patient follow-up with urology.  I believe the patient safe for discharge home with a leg bag Foley catheter in place.   Harvest Dark, MD 04/20/22 1553

## 2022-04-20 NOTE — ED Provider Notes (Signed)
[facility] Provider Note    Event Date/Time   First MD Initiated Contact with Patient 04/20/22 1339     (approximate)   History   Urinary Retention   HPI  [name] is a 77 y.o. male past medical history significant for CKD, recurrent urethral carcinoma, who presents to the emergency department with hematuria and urinary retention.  Patient was discharged from the hospital on Friday following an episode of hematuria with urinary retention.  Patient was evaluated by urology at that time and had improvement of his hematuria with Foley catheter.  Patient had a voiding trial prior to leaving the hospital and was successful but continued to have some retention.  Today had worsening urinary retention and started to note blood in his urine.  Unable to urinate since noon today.  Denies any nausea or vomiting.  States that he has been compliant with his Keflex.  Not on anticoagulation.     Physical Exam   Triage Vital Signs: ED Triage Vitals  Enc Vitals Group     BP 04/20/22 1336 106/64     Pulse Rate 04/20/22 1336 73     Resp 04/20/22 1336 20     Temp 04/20/22 1336 (!) 97.4 F (36.3 C)     Temp Source 04/20/22 1336 Oral     SpO2 04/20/22 1336 97 %     Weight 04/20/22 1337 179 lb 4.8 oz (81.3 kg)     Height 04/20/22 1337 '5\' 6"'$  (1.676 m)     Head Circumference --      Peak Flow --      Pain Score 04/20/22 1336 7     Pain Loc --      Pain Edu? --      Excl. in [place]? --     Most recent vital signs: Vitals:   04/20/22 1336  BP: 106/64  Pulse: 73  Resp: 20  Temp: (!) 97.4 F (36.3 C)  SpO2: 97%    Physical Exam Constitutional:      Appearance: He is well-developed.  HENT:     Head: Atraumatic.  Eyes:     Conjunctiva/sclera: Conjunctivae normal.  Cardiovascular:     Rate and Rhythm: Regular rhythm.  Pulmonary:     Effort: No respiratory distress.  Abdominal:     Tenderness: There is abdominal tenderness (Suprapubic).  Musculoskeletal:      Cervical back: Normal range of motion.  Skin:    General: Skin is warm.  Neurological:     Mental Status: He is alert. Mental status is at baseline.     IMPRESSION / MDM / ASSESSMENT AND PLAN / ED COURSE  I reviewed the triage vital signs and the nursing notes.  Differential diagnosis including urinary retention from blood clots from his recurrent  urothelial carcinoma, urinary tract infection, acute kidney injury, anemia  On chart review patient had a recent admission and was discharged on 3/1, felt that his hematuria was secondary to his recurrent urothelial carcinoma -patient on acute kidney injury at that time and ultimately his creatinine down trended to 2.1.  Was discharged without a Foley catheter and given return precautions.  On review of his urine culture susceptible to Keflex.LABS (all labs ordered are listed, but only abnormal results are displayed) Labs interpreted as -    Labs Reviewed  URINALYSIS, ROUTINE W REFLEX MICROSCOPIC - Abnormal; Notable for the following components:      Result Value   Color, Urine RED (*)  APPearance CLOUDY (*)    Glucose, UA   (*)    Value: TEST NOT REPORTED DUE TO COLOR INTERFERENCE OF URINE PIGMENT   Hgb urine dipstick   (*)    Value: TEST NOT REPORTED DUE TO COLOR INTERFERENCE OF URINE PIGMENT   Bilirubin Urine   (*)    Value: TEST NOT REPORTED DUE TO COLOR INTERFERENCE OF URINE PIGMENT   Ketones, ur   (*)    Value: TEST NOT REPORTED DUE TO COLOR INTERFERENCE OF URINE PIGMENT   Protein, ur   (*)    Value: TEST NOT REPORTED DUE TO COLOR INTERFERENCE OF URINE PIGMENT   Nitrite   (*)    Value: TEST NOT REPORTED DUE TO COLOR INTERFERENCE OF URINE PIGMENT   Leukocytes,Ua   (*)    Value: TEST NOT REPORTED DUE TO COLOR INTERFERENCE OF URINE PIGMENT   All other components within normal limits  URINE CULTURE  CBC  BASIC METABOLIC PANEL    TREATMENT  Foley catheter inserted  MDM  Patient with urinary retention with bladder  scan more than 200.  Foley catheter was placed and had approximately 250 of bloody urine, repeat UA and labs.  If reassuring plan to discharge home and continue on Keflex.  Patient will follow-up with urology.     PROCEDURES:  Critical Care performed: No  Procedures  Patient's presentation is most consistent with acute presentation with potential threat to life or bodily function.   MEDICATIONS ORDERED IN ED: Medications - No data to display  FINAL CLINICAL IMPRESSION(S) / ED DIAGNOSES   Final diagnoses:  Urinary retention  Hematuria, unspecified type     Rx / DC Orders   ED Discharge Orders     None        Note:  This document was prepared using Dragon voice recognition software and may include unintentional dictation errors.   Nathaniel Man, MD 04/20/22 [phone]

## 2022-04-20 NOTE — ED Triage Notes (Signed)
Reports he is experiencing urinary retentions since 11am today. Reports some clots last night and this AM

## 2022-04-21 ENCOUNTER — Telehealth: Payer: Self-pay

## 2022-04-21 ENCOUNTER — Ambulatory Visit (INDEPENDENT_AMBULATORY_CARE_PROVIDER_SITE_OTHER): Payer: Medicare Other | Admitting: Physician Assistant

## 2022-04-21 VITALS — BP 109/71 | HR 79 | Wt 171.5 lb

## 2022-04-21 DIAGNOSIS — R310 Gross hematuria: Secondary | ICD-10-CM | POA: Diagnosis not present

## 2022-04-21 LAB — URINE CULTURE: Culture: NO GROWTH

## 2022-04-21 LAB — BLADDER SCAN AMB NON-IMAGING: Scan Result: 0

## 2022-04-21 NOTE — Telephone Encounter (Signed)
Incoming call from pt's wife who states she is concerned because patient still has bloody urine in his catheter bag with some clots. She states that patient is not in pain currently and that catheter is currently free flowing. Asked wife is urine dark red like merlot or red and clear like kool aid, wife is unable to discern color of urine using these descriptors. She is concered that the patient will end up in the ER again over night, will bring patient in for catheter evaluation. Pt scheduled.

## 2022-04-21 NOTE — Progress Notes (Signed)
04/21/2022 5:20 PM   [name] [birth year] [id number]  CC: Chief Complaint  Patient presents with   Follow-up    HPI: [name] is a 77 y.o. male with PMH recurrent urothelial carcinoma of the bladder, intermediate risk prostate cancer s/p IMRT plus ADT, BPH with LUTS on silodosin, and ED on sildenafil with a recent history of gross hematuria and clot retention who presents today for ED follow-up.   He was admitted last week with gross hematuria and clot retention.  He UA appeared grossly infected, cultures grew gentamicin and Bactrim resistant E. coli, and he was started on Keflex.  His gross hematuria cleared in the hospital and he passed an inpatient voiding trial prior to discharge.  He presented to the ED yesterday with recurrent gross hematuria with passage of clots and urinary retention.  A Foley catheter was placed, unclear if this was recurrent clot retention or typical urinary retention.  He is accompanied today by his wife.  They report that he has passed multiple small clot fragments since Foley placement and with the exception of 1 temporarily occluding clot which they were able to clear on their own, his catheter has been draining well.  28 French Foley catheter is in place draining light pink urine.  There is some small clot debris and urinary sediment in the tubing, but it continues to drain well.  He has about 9 days of antibiotics remaining.  PMH: Past Medical History:  Diagnosis Date   Bladder cancer ([place])    Coronary artery disease    a.) LHC 04/12/2002 --> LVEF 42%; severe 3v CAD. 4v CABG on 04/13/2002. b.) LHC 02/03/2013 --> LVEF 50%; severe 3v CAD with patent LIMA-LAD and SVG-OM1 grafts; SVG-PDA and SVG-DG1 occluded with collaterals to PDA and DG1 from LAD.   ED (erectile dysfunction)    Family history of macular degeneration 10/05/2018   History of 2019 novel coronavirus disease (COVID-19) 03/29/2020   HLD (hyperlipidemia)    Hypertension     LAFB (left anterior fascicular block)    Prostate cancer (HCC)    RBBB (right bundle branch block)    Rosacea    S/P CABG x 4 04/13/2002   a.) LIMA-LAD, SVG-OM1, SVG-D1, SVG-PDA   Tubular adenoma of colon 02/03/2007    Surgical History: Past Surgical History:  Procedure Laterality Date   CARDIAC CATHETERIZATION Left 04/13/2002   Procedure: CARDIAC CATHETERIZATION; Location: [place]; Surgeon; Isaias Cowman, MD   CARDIAC CATHETERIZATION Left 02/03/2013   Procedure: CARDIAC CATHETERIZATION; Location: [place]; Surgeon: Isaias Cowman, MD   COLONOSCOPY W/ POLYPECTOMY     COLONOSCOPY WITH PROPOFOL N/A 04/09/2015   Procedure: COLONOSCOPY WITH PROPOFOL;  Surgeon: Hulen Luster, MD;  [facility] ENDOSCOPY;  Service: Gastroenterology;  Laterality: N/A;   CORONARY ANGIOPLASTY     CORONARY ARTERY BYPASS GRAFT N/A 04/13/2002   Procedure: 4v CABG (LIMA-LAD, SVG-OM1, SVG-D1, SVG-PDA); Location: [facility]; Surgeon: Corinna Capra, MD   CYSTOSCOPY N/A 07/17/2021   Procedure: Consuela Mimes;  Surgeon: Abbie Sons, MD;  Location: [facility] ORS;  Service: Urology;  Laterality: N/A;   CYSTOSCOPY WITH BIOPSY N/A 07/17/2021   Procedure: CYSTOSCOPY WITH BIOPSY;  Surgeon: Abbie Sons, MD;  Location: [facility] ORS;  Service: Urology;  Laterality: N/A;   CYSTOSCOPY WITH FULGERATION N/A 07/17/2021   Procedure: CYSTOSCOPY WITH FULGERATION;  Surgeon: Abbie Sons, MD;  Location: [facility] ORS;  Service: Urology;  Laterality: N/A;   TONSILLECTOMY     TRANSURETHRAL RESECTION OF BLADDER TUMOR N/A 11/13/2020   Procedure:  TRANSURETHRAL RESECTION OF BLADDER TUMOR (TURBT);  Surgeon: Abbie Sons, MD;  Location: [facility] ORS;  Service: Urology;  Laterality: N/A;   TRANSURETHRAL RESECTION OF PROSTATE      Home Medications:  Allergies as of 04/21/2022       Reactions   Cidex [glutaral] Anaphylaxis        Medication List        Accurate as of April 21, 2022  5:20 PM. If you have any questions, ask your nurse or doctor.           acetaminophen 500 MG tablet Commonly known as: TYLENOL Take 500 mg by mouth every 6 (six) hours as needed.   aspirin 81 MG tablet Take 81 mg by mouth daily.   cephALEXin 500 MG capsule Commonly known as: KEFLEX Take 1 capsule (500 mg total) by mouth 3 (three) times daily for 12 days.   cetirizine 10 MG tablet Commonly known as: ZYRTEC Take 10 mg by mouth at bedtime.   Coenzyme Q10 100 MG capsule Take 100 mg by mouth daily.   cyanocobalamin 1000 MCG tablet Commonly known as: VITAMIN B12 Take 1,000 mcg by mouth daily.   Fish Oil 1000 MG Caps Take 1,000 mg by mouth daily.   fluticasone 50 MCG/ACT nasal spray Commonly known as: FLONASE Place 1 spray into both nostrils at bedtime.   GLUCOSAMINE-CHONDROITIN PO Take 1 tablet by mouth 2 (two) times daily.   Magnesium 200 MG Tabs Take 200 mg by mouth 2 (two) times daily.   Mega Probiotic Caps Take 1 capsule by mouth daily.   metoprolol tartrate 25 MG tablet Commonly known as: LOPRESSOR Take 25 mg by mouth 2 (two) times daily.   metroNIDAZOLE 0.75 % gel Commonly known as: METROGEL Apply 1 application topically 2 (two) times daily.   multivitamin tablet Take 1 tablet by mouth daily.   nitroGLYCERIN 0.4 MG SL tablet Commonly known as: NITROSTAT Place under the tongue.   Potassium Gluconate 595 MG Caps Take 1 capsule by mouth daily as needed (After HT TR exercise).   silodosin 8 MG Caps capsule Commonly known as: RAPAFLO Take 1 capsule (8 mg total) by mouth daily with breakfast.   Vitamin A 2400 MCG (8000 UT) Caps Take 8,000 Units by mouth daily.   vitamin C 250 MG tablet Commonly known as: ASCORBIC ACID Take 250 mg by mouth 2 (two) times daily.   zinc gluconate 50 MG tablet Take 50 mg by mouth daily.        Allergies:  Allergies  Allergen Reactions   Cidex [Glutaral] Anaphylaxis    Family History: Family History  Problem Relation Age of Onset   Prostate cancer Father        Metastatic    Cancer Father     Social History:   reports that he quit smoking about 16 years ago. His smoking use included cigarettes. He has a 45.00 pack-year smoking history. He has been exposed to tobacco smoke. He has never used smokeless tobacco. He reports current alcohol use of about 2.0 standard drinks of alcohol per week. He reports that he does not use drugs.  Physical Exam: BP 109/71   Pulse 79   Wt 171 lb 8 oz (77.8 kg)   BMI 27.68 kg/m   Constitutional:  Alert and oriented, no acute distress, nontoxic appearing HEENT: [place], AT Cardiovascular: No clubbing, cyanosis, or edema Respiratory: Normal respiratory effort, no increased work of breathing Skin: No rashes, bruises or suspicious lesions Neurologic: Grossly intact,  no focal deficits, moving all 4 extremities Psychiatric: Normal mood and affect  Assessment & Plan:   1. Gross hematuria We again discussed the differential includes hemorrhagic bacterial cystitis, radiation cystitis, and recurrent bladder tumor.  I encouraged him to complete culture appropriate Keflex as prescribed.  I have already moved up his next cystoscopy with Dr. Bernardo Heater to later this month to further evaluate for the latter 2 diagnoses.  His catheter is draining well today and gross hematuria is clearing.  I offered him catheter upsizing and irrigation, though no urgent indication for either at this time.  In shared decision-making, we elected to defer these and keep his current Foley catheter in place.  Will plan for voiding trial in clinic in 3 days.  He is in agreement with this plan. - BLADDER SCAN AMB NON-IMAGING   Return in 3 days (on 04/24/2022) for Voiding trial.  Debroah Loop, PA-C  [facility] [address] [place], [place] [address] [phone]

## 2022-04-24 ENCOUNTER — Ambulatory Visit: Payer: Medicare Other | Admitting: Physician Assistant

## 2022-04-24 ENCOUNTER — Ambulatory Visit (INDEPENDENT_AMBULATORY_CARE_PROVIDER_SITE_OTHER): Payer: Medicare Other | Admitting: Physician Assistant

## 2022-04-24 VITALS — BP 120/76 | HR 76

## 2022-04-24 DIAGNOSIS — R310 Gross hematuria: Secondary | ICD-10-CM | POA: Diagnosis not present

## 2022-04-24 LAB — BLADDER SCAN AMB NON-IMAGING: Scan Result: 208

## 2022-04-24 NOTE — Progress Notes (Signed)
04/24/2022 2:34 PM   [name] 1945-11-30 [id number]  CC: Chief Complaint  Patient presents with   Urinary Retention   HPI: [name] is a 77 y.o. male with PMH recurrent urothelial carcinoma of the bladder, intermediate risk prostate cancer s/p IMRT plus ADT, BPH with LUTS on silodosin, and ED on sildenafil with a recent history of gross hematuria and clot retention who presents today for voiding trial.  Foley catheter removed in the morning, see separate procedure note for details.  He returned to clinic this afternoon for PVR.  He reports he has been able to urinate multiple times and his gross hematuria stopped following Foley removal.  PVR 208 mL.   PMH: Past Medical History:  Diagnosis Date   Bladder cancer ([place])    Coronary artery disease    a.) LHC 04/12/2002 --> LVEF 42%; severe 3v CAD. 4v CABG on 04/13/2002. b.) LHC 02/03/2013 --> LVEF 50%; severe 3v CAD with patent LIMA-LAD and SVG-OM1 grafts; SVG-PDA and SVG-DG1 occluded with collaterals to PDA and DG1 from LAD.   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   a.) LIMA-LAD, SVG-OM1, SVG-D1, SVG-PDA   Tubular adenoma of colon 02/03/2007    Surgical History: Past Surgical History:  Procedure Laterality Date   CARDIAC CATHETERIZATION Left 04/13/2002   Procedure: CARDIAC CATHETERIZATION; Location: [place]; Surgeon; Isaias Cowman, MD   CARDIAC CATHETERIZATION Left 02/03/2013   Procedure: CARDIAC CATHETERIZATION; Location: [place]; Surgeon: Isaias Cowman, MD   COLONOSCOPY W/ POLYPECTOMY     COLONOSCOPY WITH PROPOFOL N/A 04/09/2015   Procedure: COLONOSCOPY WITH PROPOFOL;  Surgeon: Hulen Luster, MD;  Location: [facility] ENDOSCOPY;   Service: Gastroenterology;  Laterality: N/A;   CORONARY ANGIOPLASTY     CORONARY ARTERY BYPASS GRAFT N/A 04/13/2002   Procedure: 4v CABG (LIMA-LAD, SVG-OM1, SVG-D1, SVG-PDA); Location: [facility]; Surgeon: Corinna Capra, MD   CYSTOSCOPY N/A 07/17/2021   Procedure: Consuela Mimes;  Surgeon: Abbie Sons, MD;  Location: [facility];  Service: Urology;  Laterality: N/A;   CYSTOSCOPY WITH BIOPSY N/A 07/17/2021   Procedure: CYSTOSCOPY WITH BIOPSY;  Surgeon: Abbie Sons, MD;  Location: [facility] ORS;  Service: Urology;  Laterality: N/A;   CYSTOSCOPY WITH FULGERATION N/A 07/17/2021   Procedure: CYSTOSCOPY WITH FULGERATION;  Surgeon: Abbie Sons, MD;  Location: [facility] OR[facility];  Service: Urology;  Laterality: N/A;   TONSILLECTOMY     TRANSURETHRAL RESECTION OF BLADDER TUMOR N/A 11/13/2020   Procedure: TRANSURETHRAL RESECTION OF BLADDER TUMOR (TURBT);  Surgeon: Abbie Sons, MD;  Location: [facility] ORS;  Service: Urology;  Laterality: N/A;   TRANSURETHRAL RESECTION OF PROSTATE      Home Medications:  Allergies as of 04/24/2022       Reactions   Cidex [glutaral] Anaphylaxis        Medication List        Accurate as of April 24, 2022  2:34 PM. If you have any questions, ask your nurse or doctor.          acetaminophen 500 MG tablet Commonly known as: TYLENOL Take 500 mg by mouth every 6 (six) hours as needed.   aspirin 81 MG tablet Take 81 mg by mouth  daily.   cephALEXin 500 MG capsule Commonly known as: KEFLEX Take 1 capsule (500 mg total) by mouth 3 (three) times daily for 12 days.   cetirizine 10 MG tablet Commonly known as: ZYRTEC Take 10 mg by mouth at bedtime.   Coenzyme Q10 100 MG capsule Take 100 mg by mouth daily.   cyanocobalamin 1000 MCG tablet Commonly known as: VITAMIN B12 Take 1,000 mcg by mouth daily.   Fish Oil 1000 MG Caps Take 1,000 mg by mouth daily.   fluticasone 50 MCG/ACT nasal spray Commonly known as: FLONASE Place 1 spray into both nostrils at bedtime.    GLUCOSAMINE-CHONDROITIN PO Take 1 tablet by mouth 2 (two) times daily.   Magnesium 200 MG Tabs Take 200 mg by mouth 2 (two) times daily.   Mega Probiotic Caps Take 1 capsule by mouth daily.   metoprolol tartrate 25 MG tablet Commonly known as: LOPRESSOR Take 25 mg by mouth 2 (two) times daily.   metroNIDAZOLE 0.75 % gel Commonly known as: METROGEL Apply 1 application topically 2 (two) times daily.   multivitamin tablet Take 1 tablet by mouth daily.   nitroGLYCERIN 0.4 MG SL tablet Commonly known as: NITROSTAT Place under the tongue.   Potassium Gluconate 595 MG Caps Take 1 capsule by mouth daily as needed (After HT TR exercise).   silodosin 8 MG Caps capsule Commonly known as: RAPAFLO Take 1 capsule (8 mg total) by mouth daily with breakfast.   Vitamin A 2400 MCG (8000 UT) Caps Take 8,000 Units by mouth daily.   vitamin C 250 MG tablet Commonly known as: ASCORBIC ACID Take 250 mg by mouth 2 (two) times daily.   zinc gluconate 50 MG tablet Take 50 mg by mouth daily.        Allergies:  Allergies  Allergen Reactions   Cidex [Glutaral] Anaphylaxis    Family History: Family History  Problem Relation Age of Onset   Prostate cancer Father        Metastatic   Cancer Father     Social History:   reports that he quit smoking about 16 years ago. His smoking use included cigarettes. He has a 45.00 pack-year smoking history. He has been exposed to tobacco smoke. He has never used smokeless tobacco. He reports current alcohol use of about 2.0 standard drinks of alcohol per week. He reports that he does not use drugs.  Physical Exam: BP 120/76   Pulse 76   Constitutional:  Alert and oriented, no acute distress, nontoxic appearing HEENT: [place], AT Cardiovascular: No clubbing, cyanosis, or edema Respiratory: Normal respiratory effort, no increased work of breathing Skin: No rashes, bruises or suspicious lesions Neurologic: Grossly intact, no focal deficits, moving  all 4 extremities Psychiatric: Normal mood and affect  Laboratory Data: Results for orders placed or performed in visit on 04/24/22  BLADDER SCAN AMB NON-IMAGING  Result Value Ref Range   Scan Result 208 ml    Assessment & Plan:   1. Gross hematuria Resolved following Foley removal this morning.  He is voiding without difficulty.  PVR stable.  Counseled him to complete Keflex as prescribed, continue silodosin, and continue to hold [place].  Counseled him to keep scheduled cystoscopy with Dr. Bernardo Heater later this month for evaluation of possible radiation cystitis versus recurrent bladder tumor that could be the source of his hematuria.  He expressed understanding. - BLADDER SCAN AMB NON-IMAGING  Return for Keep plans for cystoscopy with Dr. Bernardo Heater later this month.  Debroah Loop, PA-C  [facility] [address], [place], [place] [address] [phone]

## 2022-04-24 NOTE — Progress Notes (Signed)
Catheter Removal  Patient is present today for a catheter removal.  63m of water was drained from the balloon. A 14FR foley cath was removed from the bladder, no complications were noted. Patient tolerated well.  Performed by: HMariam Dollar RMA  Follow up/ Additional notes: 2pm on 04/24/2022

## 2022-05-15 ENCOUNTER — Encounter: Payer: Self-pay | Admitting: Urology

## 2022-05-15 ENCOUNTER — Ambulatory Visit (INDEPENDENT_AMBULATORY_CARE_PROVIDER_SITE_OTHER): Payer: Medicare Other | Admitting: Urology

## 2022-05-15 VITALS — BP 134/66 | HR 80 | Ht 66.0 in | Wt 171.0 lb

## 2022-05-15 DIAGNOSIS — R310 Gross hematuria: Secondary | ICD-10-CM

## 2022-05-15 NOTE — Progress Notes (Signed)
Mr. [name] was scheduled for surveillance cystoscopy today.  His urinalysis was cloudy with greater than 30 WBC and nitrite positive.  It was felt visualization would be suboptimal and to reschedule cystoscopy.  A urine culture was ordered.  We also discussed scheduling cystoscopy under anesthesia after treatment of his bacteriuria as an option to office cystoscopy.  Will notify with culture results and antibiotic recommendation

## 2022-05-16 LAB — URINALYSIS, COMPLETE
Bilirubin, UA: NEGATIVE
Glucose, UA: NEGATIVE
Ketones, UA: NEGATIVE
Nitrite, UA: POSITIVE — AB
Specific Gravity, UA: 1.01 (ref 1.005–1.030)
Urobilinogen, Ur: 0.2 mg/dL (ref 0.2–1.0)
pH, UA: 5.5 (ref 5.0–7.5)

## 2022-05-16 LAB — MICROSCOPIC EXAMINATION: WBC, UA: >30 /hpf — AB (ref 0–5)

## 2022-05-19 ENCOUNTER — Encounter: Payer: Self-pay | Admitting: Urology

## 2022-05-20 LAB — CULTURE, URINE COMPREHENSIVE

## 2022-05-21 ENCOUNTER — Other Ambulatory Visit: Payer: Self-pay | Admitting: *Deleted

## 2022-05-21 ENCOUNTER — Encounter: Payer: Self-pay | Admitting: Urology

## 2022-05-21 ENCOUNTER — Telehealth: Payer: Self-pay | Admitting: *Deleted

## 2022-05-21 MED ORDER — CEFUROXIME AXETIL 500 MG PO TABS: 500.0000 mg | ORAL_TABLET | Freq: Two times a day (BID) | ORAL | 0 refills | Status: AC

## 2022-05-21 NOTE — Telephone Encounter (Signed)
-----   Message from Abbie Sons, MD sent at 05/21/2022  7:28 AM EDT ----- Final urine culture results have returned.  Please send Rx cefuroxime 500 mg twice daily x 14 days

## 2022-05-21 NOTE — Telephone Encounter (Signed)
Notified patient as instructed, patient pleased °

## 2022-05-22 ENCOUNTER — Encounter: Payer: Self-pay | Admitting: Urology

## 2022-05-28 ENCOUNTER — Other Ambulatory Visit: Payer: Medicare Other | Admitting: Urology

## 2022-06-12 ENCOUNTER — Encounter: Payer: Self-pay | Admitting: Urology

## 2022-06-12 ENCOUNTER — Ambulatory Visit (INDEPENDENT_AMBULATORY_CARE_PROVIDER_SITE_OTHER): Payer: Medicare Other | Admitting: Urology

## 2022-06-12 VITALS — BP 100/61 | HR 77 | Ht 69.0 in | Wt 174.0 lb

## 2022-06-12 DIAGNOSIS — R310 Gross hematuria: Secondary | ICD-10-CM

## 2022-06-12 DIAGNOSIS — C679 Malignant neoplasm of bladder, unspecified: Secondary | ICD-10-CM

## 2022-06-12 DIAGNOSIS — R8281 Pyuria: Secondary | ICD-10-CM

## 2022-06-12 LAB — URINALYSIS, COMPLETE
Bilirubin, UA: NEGATIVE
Glucose, UA: NEGATIVE
Ketones, UA: NEGATIVE
Nitrite, UA: NEGATIVE
Specific Gravity, UA: 1.015 (ref 1.005–1.030)
Urobilinogen, Ur: 0.2 mg/dL (ref 0.2–1.0)
pH, UA: 6.5 (ref 5.0–7.5)

## 2022-06-12 LAB — MICROSCOPIC EXAMINATION: WBC, UA: >30 /hpf — AB (ref 0–5)

## 2022-06-12 LAB — BLADDER SCAN AMB NON-IMAGING: Scan Result: 132

## 2022-06-12 NOTE — Progress Notes (Signed)
[name] had persistent pyuria on today's UA.  Cystoscopy was not performed as visualization would be suboptimal.  Urine culture was repeated and will schedule cystoscopy under anesthesia with possible bladder biopsy/TURBT

## 2022-06-14 ENCOUNTER — Encounter: Payer: Self-pay | Admitting: Urology

## 2022-06-16 ENCOUNTER — Other Ambulatory Visit: Payer: Self-pay | Admitting: *Deleted

## 2022-06-16 ENCOUNTER — Encounter: Payer: Self-pay | Admitting: *Deleted

## 2022-06-16 LAB — CULTURE, URINE COMPREHENSIVE

## 2022-06-16 MED ORDER — CEFUROXIME AXETIL 500 MG PO TABS: 500.0000 mg | ORAL_TABLET | Freq: Two times a day (BID) | ORAL | 0 refills | Status: AC

## 2022-06-18 ENCOUNTER — Other Ambulatory Visit: Payer: Self-pay | Admitting: Urology

## 2022-06-18 ENCOUNTER — Telehealth: Payer: Self-pay

## 2022-06-18 DIAGNOSIS — Z8551 Personal history of malignant neoplasm of bladder: Secondary | ICD-10-CM

## 2022-06-18 DIAGNOSIS — R310 Gross hematuria: Secondary | ICD-10-CM

## 2022-06-18 NOTE — Telephone Encounter (Signed)
I spoke with [name]. We have discussed possible surgery dates and Tuesday May 7th, 2024 was agreed upon by all parties. Patient given information about surgery date, what to expect pre-operatively and post operatively.  We discussed that a Pre-Admission Testing office will be calling to set up the pre-op visit that will take place prior to surgery, and that these appointments are typically done over the phone with a Pre-Admissions RN. Informed patient that our office will communicate any additional care to be provided after surgery. Patients questions or concerns were discussed during our call. Advised to call our office should there be any additional information, questions or concerns that arise. Patient verbalized understanding.

## 2022-06-18 NOTE — Progress Notes (Signed)
Surgical Physician Order Form [facility] [place]  * Scheduling expectation : Next Available  *Length of Case: 60 min  *Clearance needed: no  *Anticoagulation Instructions: N/A  *Aspirin Instructions: Hold Aspirin  *Post-op visit Date/Instructions:  1-2 week with pathology review  *Diagnosis:  Hematuria, history of bladder cancer  *Procedure: Cystoscopy; possible TURBT, possible bladder biopsy/fulguration   Additional orders: N/A  -Admit type: OUTpatient  -Anesthesia: Choice  -VTE Prophylaxis Standing Order SCD's       Other:   -Standing Lab Orders Per Anesthesia    Lab other: None  -Standing Test orders EKG/Chest x-ray per Anesthesia       Test other:   - Medications:  Ceftriaxone(Rocephin) 1gm IV  -Other orders:   He is on cefuroxime for a UTI.  Please extend this medication if he completes before scheduled surgery date

## 2022-06-18 NOTE — Progress Notes (Signed)
   [facility] Surgical Posting Form  Surgery Date: Date: 06/24/2022  Surgeon: Dr. Irineo Axon, MD  Inpt ( No  )   Outpt (Yes)   Obs ( No  )   Diagnosis: R31.0 Hematuria, Z85.51 History of Bladder Cancer  -CPT: 52000, 40981, 19147, 82956  Surgery: Diagnostic Cystoscopy with possible Transurethral Resection of Bladder Tumor with Possible Bladder Biopsy and Possible Bladder Fulguration   Stop Anticoagulations: Yes patient will need to hold ASA  Cardiac/Medical/Pulmonary Clearance needed: no  *Orders entered into EPIC  Date: 06/18/22   *Case booked in [place]  Date: 06/18/22  *Notified pt of Surgery: Date: 06/18/22  PRE-OP UA & CX: no  *Placed into Prior Authorization Work [place] Date: 06/18/22  Assistant/laser/rep:No

## 2022-06-20 ENCOUNTER — Other Ambulatory Visit: Payer: Self-pay

## 2022-06-20 ENCOUNTER — Encounter
Admission: RE | Admit: 2022-06-20 | Discharge: 2022-06-20 | Disposition: A | Discharge status: Home / Self Care | Payer: Medicare Other | Source: Ambulatory Visit | Attending: Urology | Admitting: Urology

## 2022-06-20 ENCOUNTER — Encounter: Payer: Self-pay | Admitting: Urology

## 2022-06-20 DIAGNOSIS — D508 Other iron deficiency anemias: Secondary | ICD-10-CM

## 2022-06-20 DIAGNOSIS — I10 Essential (primary) hypertension: Secondary | ICD-10-CM

## 2022-06-20 DIAGNOSIS — Z01812 Encounter for preprocedural laboratory examination: Secondary | ICD-10-CM

## 2022-06-20 HISTORY — DX: Anemia, unspecified: D64.9

## 2022-06-20 HISTORY — DX: Angina pectoris, unspecified: I20.9

## 2022-06-20 NOTE — Patient Instructions (Addendum)
Your procedure is scheduled on: 06/24/22 - Tuesday Report to the Registration Desk on the 1st floor of the [facility]. To find out your arrival time, please call [phone] between 1PM - 3PM on: 06/23/22 - Monday If your arrival time is 6:00 am, do not arrive before that time as the [facility] entrance doors do not open until 6:00 am.  REMEMBER: Instructions that are not followed completely may result in serious medical risk, up to and including death; or upon the discretion of your surgeon and anesthesiologist your surgery may need to be rescheduled.  Do not eat food or drink any liquids after midnight the night before surgery.  No gum chewing or hard candies.  One week prior to surgery: Stop Anti-inflammatories (NSAIDS) such as Advil, Aleve, Ibuprofen, Motrin, Naproxen, Naprosyn and Aspirin based products such as Excedrin, Goody's Powder, BC Powder.  Stop ANY OVER THE COUNTER supplements until after surgery - Coenzyme Q10 , GLUCOSAMINE-CHONDROITIN , Magnesium 200 , Multiple Vitamin (MULTIVITAMIN) , Omega-3 Fatty Acids (FISH OIL) , Potassium Gluconate , Vitamin A , vitamin B-12 , vitamin C , zinc gluconate .  You may take Tylenol if needed for pain up until the day of surgery.  TAKE ONLY THESE MEDICATIONS THE MORNING OF SURGERY WITH A SIP OF WATER:  cefUROXime (CEFTIN)  metoprolol tartrate (LOPRESSOR)  silodosin (RAPAFLO)  isosorbide mononitrate (IMDUR)    HOLD aspirin 81 for 3 days prior to your surgery.   No Alcohol for 24 hours before or after surgery.  No Smoking including e-cigarettes for 24 hours before surgery.  No chewable tobacco products for at least 6 hours before surgery.  No nicotine patches on the day of surgery.  Do not use any "recreational" drugs for at least a week (preferably 2 weeks) before your surgery.  Please be advised that the combination of cocaine and anesthesia may have negative outcomes, up to and including death. If you test positive for  cocaine, your surgery will be cancelled.  On the morning of surgery brush your teeth with toothpaste and water, you may rinse your mouth with mouthwash if you wish. Do not swallow any toothpaste or mouthwash.  Do not wear jewelry, make-up, hairpins, clips or nail polish.  Do not wear lotions, powders, or perfumes.   Do not shave body hair from the neck down 48 hours before surgery.  Contact lenses, hearing aids and dentures may not be worn into surgery.  Do not bring valuables to the hospital. [facility] is not responsible for any missing/lost belongings or valuables.   Notify your doctor if there is any change in your medical condition (cold, fever, infection).  Wear comfortable clothing (specific to your surgery type) to the hospital.  After surgery, you can help prevent lung complications by doing breathing exercises.  Take deep breaths and cough every 1-2 hours. Your doctor may order a device called an Incentive Spirometer to help you take deep breaths. When coughing or sneezing, hold a pillow firmly against your incision with both hands. This is called "splinting." Doing this helps protect your incision. It also decreases belly discomfort.  If you are being admitted to the hospital overnight, leave your suitcase in the car. After surgery it may be brought to your room.  In case of increased patient census, it may be necessary for you, the patient, to continue your postoperative care in the Same Day Surgery department.  If you are being discharged the day of surgery, you will not be allowed to  drive home. You will need a responsible individual to drive you home and stay with you for 24 hours after surgery.   If you are taking public transportation, you will need to have a responsible individual with you.  Please call the Pre-admissions Testing Dept. at [phone] if you have any questions about these instructions.  Surgery Visitation Policy:  Patients having surgery or a  procedure may have two visitors.  Children under the age of 26 must have an adult with them who is not the patient.  Inpatient Visitation:    Visiting hours are 7 a.m. to 8 p.m. Up to four visitors are allowed at one time in a patient room. The visitors may rotate out with other people during the day.  One visitor age 78 or older may stay with the patient overnight and must be in the room by 8 p.m.

## 2022-06-20 NOTE — Progress Notes (Signed)
Perioperative / Anesthesia Services  Pre-Admission Testing Clinical Review / Preoperative Anesthesia Consult  Date: 06/23/22  Patient Demographics:  Name: [name] DOB:   03-Apr-1945 MRN:   [id number]  Planned Surgical Procedure(s):    Case: [id number] Date/Time: 06/24/22 0823   Procedures:      DIAGNOSTIC CYSTOSCOPY     TRANSURETHRAL RESECTION OF BLADDER TUMOR (TURBT)     CYSTOSCOPY WITH BLADDER BIOPSY     CYSTOSCOPY WITH FULGERATION   Anesthesia type: Choice   Pre-op diagnosis: Hematuria, History of Bladder Cancer   Location: [facility] OR ROOM 10 / [facility]   Surgeons: Jake Altes, MD     NOTE: Available PAT nursing documentation and vital signs have been reviewed. Clinical nursing staff has updated patient's PMH/PSHx, current medication list, and drug allergies/intolerances to ensure comprehensive history available to assist in medical decision making as it pertains to the aforementioned surgical procedure and anticipated anesthetic course. Extensive review of available clinical information personally performed. [place] [facility] and PSHx updated with any diagnoses/procedures that  may have been inadvertently omitted during his intake with the pre-admission testing department's nursing staff.  Clinical Discussion:  [name] is a 77 y.o. male who is submitted for pre-surgical anesthesia review and clearance prior to him undergoing the above procedure. Patient is a Former Smoker (45 pack years; quit 02/2006). Pertinent PMH includes: CAD (s/p CABG), infrarenal AAA, BILATERAL common iliac artery aneurysms, bifascicular block (RBBB + LAFB), angina, aortic atherosclerosis, HTN, HLD, bladder tumor, ED, anemia, prostate cancer.   Patient is followed by cardiology Jake Junker, MD). He was last seen in the cardiology clinic on 01/824/2024; notes reviewed. At the time of his clinic visit, patient doing well overall from a cardiovascular perspective. Patient  reporting intermittent episodes of increased peripheral edema. Of note, diuretic (triamterene-HCTZ) previously discontinued due to micturitional frequency. Patient denied any chest pain, shortness of breath, PND, orthopnea, palpitations,  weakness, fatigue, vertiginous symptoms, or presyncope/syncope. Patient with a past medical history significant for cardiovascular diagnoses. Documented physical exam was grossly benign, providing no evidence of acute exacerbation and/or decompensation of the patient's known cardiovascular conditions.  Patient underwent diagnostic left heart catheterization on 04/13/2002 revealed an LVEF of 42%.  Study demonstrated multivessel CAD; 100% proximal RCA, 50% distal RCA, 80% LM, 50% proximal LCx, 80% OM1, 90% mid LAD, 50% D1, and 90% D2.  Given degree and complexity of patient's disease, recommendations were for consultation with CVTS for CABG procedure.   Patient underwent a four-vessel CABG on 04/13/2002.  LIMA-LAD, SVG-OM1, SVG-D1, and SVG-PDA bypass grafts were placed.   Repeat diagnostic left heart catheterization was performed on 02/03/2013 revealing a normal LVEF of 50%.  Multivessel CAD once again demonstrated; 80% stenosis mid LM, 80% stenosis of the proximal LAD, 80% stenosis of OM1, and 100% stenosis proximal RCA.  Patient with patent LIMA-LAD and SVG-OM1 grafts.  SVG-PDA and SVG-DG 1 grafts were occluded with collaterals to PDA and DG 1 from the LAD.   Myocardial perfusion imaging study performed on 09/22/2018 revealed an LVEF of 45%.  There was inferior wall hypokinesis noted.  Left ventricular cavity size was enlarged.  There was evidence of a moderate inferior scar without evidence for ischemia.  Blood pressure well controlled at 124/68 mmHg on currently prescribed beta blocker for disease metoprolol tartrate) and alpha-blocker (sildosin) therapies.  Patient is on atorvastatin + omega-3 fatty acid for his HLD diagnosis and ASCVD prevention.  Patient has a supply  of short acting nitrates (  NTG) to use on a as needed basis for recurrent angina/anginal equivalent symptoms; denied recent use.  Patient is not diabetic.  He does not have an OSAH diagnosis. Functional capacity, as defined by DASI, is documented as being >/= 4 METS. No changes were made to his medication regimen during his visit with cardiology.  Patient scheduled to follow-up with outpatient cardiology in 4 months or sooner if needed.  [name] is scheduled for an DIAGNOSTIC CYSTOSCOPY; TRANSURETHRAL RESECTION OF BLADDER TUMOR (TURBT); CYSTOSCOPY WITH BLADDER BIOPSY; CYSTOSCOPY WITH FULGURATION on 06/24/2022 with Dr. Irineo Axon, MD.  Given patient's past medical history significant for cardiovascular diagnoses, presurgical cardiac clearance was sought by the PAT team. Per cardiology, "this patient is optimized for surgery and may proceed with the planned procedural course with a LOW risk of significant perioperative cardiovascular complications".  In review of his medication reconciliation, it is noted that patient is currently on prescribed daily antithrombotic therapy. He has been instructed on recommendations for holding his daily low dose ASA for 3 days prior to his procedure with plans to restart as soon as postoperative bleeding risk felt to be minimized by his attending surgeon. The patient has been instructed that his last dose of his ASA should be on 06/20/2022.  Patient denies previous perioperative complications with anesthesia in the past. In review of the available records, it is noted that patient underwent a general anesthetic course here at [facility] ([facility]) in 06/2021 without documented complications.      06/12/2022    1:15 PM 05/15/2022    3:19 PM 04/24/2022    8:32 AM  Vitals with BMI  Height 5\' 9"  5\' 6"    Weight 174 lbs 171 lbs   BMI 25.68 27.61   Systolic 100 134 161  Diastolic 61 66 76  Pulse 77 80 76    Providers/Specialists:    NOTE: Primary physician provider listed below. Patient may have been seen by APP or partner within same practice.   PROVIDER ROLE / SPECIALTY LAST Jake Cunas, MD Urology (Surgeon) 06/12/2022  Jake Nurse, MD Primary Care Provider 04/25/2022  Jake Millard, MD Cardiology 03/12/2022   Allergies:  Cidex [glutaral] and Other  Current Home Medications:   No current facility-administered medications for this encounter.    acetaminophen (TYLENOL) 500 MG tablet   aspirin 81 MG tablet   atorvastatin (LIPITOR) 10 MG tablet   cefUROXime (CEFTIN) 500 MG tablet   cetirizine (ZYRTEC) 10 MG tablet   Coenzyme Q10 100 MG capsule   ferrous sulfate 324 MG TBEC   fluticasone (FLONASE) 50 MCG/ACT nasal spray   GLUCOSAMINE-CHONDROITIN PO   isosorbide mononitrate (IMDUR) 30 MG 24 hr tablet   Magnesium 200 MG TABS   metoprolol tartrate (LOPRESSOR) 25 MG tablet   metroNIDAZOLE (METROGEL) 0.75 % gel   Multiple Vitamin (MULTIVITAMIN) tablet   nitroGLYCERIN (NITROSTAT) 0.4 MG SL tablet   Omega-3 Fatty Acids (FISH OIL) 1000 MG CAPS   Potassium Gluconate 595 MG CAPS   Probiotic Product (MEGA PROBIOTIC) CAPS   silodosin (RAPAFLO) 8 MG CAPS capsule   Vitamin A 2400 MCG (8000 UT) CAPS   vitamin B-12 (CYANOCOBALAMIN) 1000 MCG tablet   vitamin C (ASCORBIC ACID) 250 MG tablet   zinc gluconate 50 MG tablet   History:   Past Medical History:  Diagnosis Date   Anemia    Aneurysm of infrarenal abdominal aorta (HCC)    a.) CTA AP 07/28/2019: measured 3.0  cm   Aneurysm of left common iliac artery (HCC)    a.) CTA AP 07/28/2019: measured 1.6 cm   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 4v CABG on 04/13/2002. b.) LHC 02/03/2013 --> LVEF 50%;  severe 3v CAD with patent LIMA-LAD and SVG-OM1 grafts; SVG-PDA and SVG-DG1 occluded with collaterals to PDA and DG1 from LAD.   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   a.) LIMA-LAD, SVG-OM1, SVG-D1, SVG-PDA   Tubular adenoma of colon 02/03/2007   Past Surgical History:  Procedure Laterality Date   CARDIAC CATHETERIZATION Left 04/13/2002   Procedure: CARDIAC CATHETERIZATION; Location: [facility]; Surgeon; Jake Millard, MD   CARDIAC CATHETERIZATION Left 02/03/2013   Procedure: CARDIAC CATHETERIZATION; Location: [facility]; Surgeon: Jake Millard, MD   COLONOSCOPY W/ POLYPECTOMY     COLONOSCOPY WITH PROPOFOL N/A 04/09/2015   Procedure: COLONOSCOPY WITH PROPOFOL;  Surgeon: Wallace Cullens, MD;  Location: [facility] ENDOSCOPY;  Service: Gastroenterology;  Laterality: N/A;   CORONARY ANGIOPLASTY     CORONARY ARTERY BYPASS GRAFT N/A 04/13/2002   Procedure: 4v CABG (LIMA-LAD, SVG-OM1, SVG-D1, SVG-PDA); Location: [facility]; Surgeon: Rana Snare, MD   CYSTOSCOPY N/A 07/17/2021   Procedure: Bluford Kaufmann;  Surgeon: Jake Altes, MD;  Location: [facility] ORS;  Service: Urology;  Laterality: N/A;   CYSTOSCOPY WITH BIOPSY N/A 07/17/2021   Procedure: CYSTOSCOPY WITH BIOPSY;  Surgeon: Jake Altes, MD;  Location: [facility] ORS;  Service: Urology;  Laterality: N/A;   CYSTOSCOPY WITH FULGERATION N/A 07/17/2021   Procedure: CYSTOSCOPY WITH FULGERATION;  Surgeon: Jake Altes, MD;  Location: [facility] ORS;  Service: Urology;  Laterality: N/A;   EYE SURGERY     both eyes with cataract with lens 2021   TONSILLECTOMY     TRANSURETHRAL RESECTION OF BLADDER TUMOR N/A 11/13/2020   Procedure: TRANSURETHRAL RESECTION OF BLADDER TUMOR (TURBT);  Surgeon: Jake Altes, MD;  Location: [facility] ORS;  Service: Urology;  Laterality: N/A;    TRANSURETHRAL RESECTION OF PROSTATE     Family History  Problem Relation Age of Onset   Prostate cancer Father        Metastatic   Cancer Father    Social History   Tobacco Use   Smoking status: Former    Packs/day: 0.75    Years: 60.00    Additional pack years: 0.00    Total pack years: 45.00    Types: Cigarettes    Quit date: 2008    Years since quitting: 16.3    Passive exposure: Past   Smokeless tobacco: Never  Substance Use Topics   Alcohol use: Yes    Alcohol/week: 2.0 standard drinks of alcohol    Types: 2 Standard drinks or equivalent per week    Comment: 14/week   Drug use: No    Pertinent Clinical Results:  LABS:   Lab Results  Component Value Date   WBC 12.9 (H) 06/23/2022   HGB 11.6 (L) 06/23/2022   HCT 37.5 (L)  06/23/2022   MCV 103.9 (H) 06/23/2022   PLT 359 06/23/2022   Lab Results  Component Value Date   NA 139 06/23/2022   K 4.1 06/23/2022   CO2 24 06/23/2022   GLUCOSE 89 06/23/2022   BUN 29 (H) 06/23/2022   CREATININE 1.48 (H) 06/23/2022   CALCIUM 9.0 06/23/2022   GFRNONAA 48 (L) 06/23/2022   Component Date Value Ref Range Status   Specific Gravity, UA 06/12/2022 1.015  1.005 - 1.030 Final   pH, UA 06/12/2022 6.5  5.0 - 7.5 Final   Color, UA 06/12/2022 Yellow  Yellow Final   Appearance Ur 06/12/2022 Cloudy (A)  Clear Final   Leukocytes,UA 06/12/2022 3+ (A)  Negative Final   Protein,UA 06/12/2022 2+ (A)  Negative/Trace Final   Glucose, UA 06/12/2022 Negative  Negative Final   Ketones, UA 06/12/2022 Negative  Negative Final   RBC, UA 06/12/2022 2+ (A)  Negative Final   Bilirubin, UA 06/12/2022 Negative  Negative Final   Urobilinogen, Ur 06/12/2022 0.2  0.2 - 1.0 mg/dL Final   Nitrite, UA 57/84/6962 Negative  Negative Final   Microscopic Examination 06/12/2022 See below:   Final   Scan Result 06/12/2022 132   Final   Urine Culture, Comprehensive 06/12/2022 Final report (A)   Final   Organism ID, Bacteria 06/12/2022 Escherichia coli (A)    Final   Comment: Cefazolin <=4 ug/mL Cefazolin with an MIC <=16 predicts susceptibility to the oral agents cefaclor, cefdinir, cefpodoxime, cefprozil, cefuroxime, cephalexin, and loracarbef when used for therapy of uncomplicated urinary tract infections due to E. coli, Klebsiella pneumoniae, and Proteus mirabilis. 25,000-50,000 colony forming units per mL    ANTIMICROBIAL SUSCEPTIBILITY 06/12/2022 Comment   Final   Comment:       ** S = Susceptible; I = Intermediate; R = Resistant ; P = Positive; N = Negative  MICS are expressed in micrograms per mL  Antibiotic                 RSLT#1    RSLT#2    RSLT#3    RSLT#4 Amoxicillin/Clavulanic Acid    S Ampicillin                       S Cefepime                       S Ceftriaxone                    S Cefuroxime                    S Ciprofloxacin                  S Ertapenem                     S Gentamicin                    R Imipenem                       S Levofloxacin                   I Meropenem                   S Nitrofurantoin                 S Piperacillin/Tazobactam  S Tetracycline                   R Tobramycin                     I Trimethoprim/Sulfa        R    WBC, UA 06/12/2022 >30 (A)  0 - 5 /hpf Final   RBC, Urine 06/12/2022 11-30 (A)  0 - 2 /hpf Final   Epithelial Cells (non renal) 06/12/2022 0-10  0 - 10 /hpf Final   Bacteria, UA 06/12/2022 Many (A)  None seen/Few Final    ECG: Date: 06/23/2022 Time ECG obtained: 1026 AM Rate: 58 bpm Rhythm:  Sinus bradycardia; bifascicular block (RBBB + LAFB) Axis (leads I and aVF): Normal Intervals: PR 190 ms. QRS 144 ms. QTc 449 ms. ST segment and T wave changes: No evidence of acute ST segment elevation or depression.  Evidence of an age undetermined inferior infarct present. Comparison: Similar to previous tracing obtained on 11/09/2020   IMAGING / PROCEDURES: CT ANGIO ABDOMEN PELVIS  W &/OR WO CONTRAST performed on 07/28/2019 3 cm infrarenal abdominal aortic  aneurysm. Recommend followup by [place] in 3 years. This recommendation follows ACR consensus guidelines: White Paper of the ACR Incidental Findings Committee II on Vascular Findings. J Am Coll Radiol 2013; 16:109-604 2.1 cm right and 1.6 cm left common iliac artery aneurysms. Left SFA origin stenosis of possible hemodynamic significance. Correlate with clinical symptomatology and ABIs. Cholelithiasis. Aortic atherosclerosis   MYOCARDIAL PERFUSION IMAGING STUDY (LEXISCAN) performed on 09/22/2018 LVEF 45% Inferior wall hypokinesis Left ventricular cavity size enlarged No artifacts noted Moderate inferior scar without evidence of ischemia   LEFT HEART CATHETERIZATION AND CORONARY ANGIOGRAPHY performed on 02/03/2013 LVEF 50% Right dominant coronary circulation CAD 80% stenosis of the mid LM 80% stenosis of the proximal LAD 80% stenosis OM1 100% stenosis proximal RCA Distal RCA supplied by moderate collaterals from the distal LAD and proximal RCA Previously placed bypass grafts LIMA-LAD patent SVG-OM1 patent SVG-PDA occluded SVG-DG1 occluded Collaterals from PDA and DG1 from LAD   CORONARY ARTERY BYPASS GRAFTING performed on  04/13/2002 Four-vessel revascularization (CABG) procedure LIMA-LAD SVG-OM1 SVG-D1 SVG-PDA   LEFT HEART CATHETERIZATION AND CORONARY ANGIOGRAPHY performed on 04/13/2002 LVEF 42% Multivessel CAD 100% stenosis proximal RCA 50% stenosis x 2 distal RCA 80% stenosis LM 50% stenosis proximal LCx 80% and 50% stenoses OM1 90 and 50% stenosis mid LAD 50% stenosis D1 100% stenosis D2 Recommendations: consult CVTS for consideration of revascularization (CABG) procedure  Impression and Plan:  [name] has been referred for pre-anesthesia review and clearance prior to him undergoing the planned anesthetic and procedural courses. Available labs, pertinent testing, and imaging results were personally reviewed by me in preparation for upcoming  operative/procedural course. [facility] medical record has been updated following extensive record review and patient interview with PAT staff.   This patient has been appropriately cleared by cardiology with an overall LOW risk of significant perioperative cardiovascular complications. Based on clinical review performed today (06/23/22), barring any significant acute changes in the patient's overall condition, it is anticipated that he will be able to proceed with the planned surgical intervention. Any acute changes in clinical condition may necessitate his procedure being postponed and/or cancelled. Patient will meet with anesthesia team (MD and/or CRNA) on the day of his procedure for preoperative evaluation/assessment. Questions regarding anesthetic course will be fielded at that time.   Pre-surgical instructions were reviewed with the patient during his  PAT appointment, and questions were fielded to satisfaction by PAT clinical staff. He has been instructed on which medications that he will need to hold prior to surgery, as well as the ones that have been deemed safe/appropriate to take on the day of his procedure. As part of the general education provided by PAT, patient made aware both verbally and in writing, that he would need to abstain from the use of any illegal substances during his perioperative course.  He was advised that failure to follow the provided instructions could necessitate case cancellation or result in serious perioperative complications up to and including death. Patient encouraged to contact PAT and/or his surgeon's office to discuss any questions or concerns that may arise prior to surgery; verbalized understanding.   Quentin Mulling, MSN, APRN, FNP-C, CEN [facility]  Peri-operative Services [facility] Practitioner Phone: [phone] Fax: [phone] 06/23/22 1:25 PM  NOTE: This note has been prepared using Scientist, clinical (histocompatibility and immunogenetics). Despite my best ability  to proofread, there is always the potential that unintentional transcriptional errors may still occur from this process.

## 2022-06-23 ENCOUNTER — Encounter
Admission: RE | Admit: 2022-06-23 | Discharge: 2022-06-23 | Disposition: A | Discharge status: Home / Self Care | Payer: Medicare Other | Source: Ambulatory Visit | Attending: Urology | Admitting: Urology

## 2022-06-23 ENCOUNTER — Encounter: Payer: Self-pay | Admitting: Urgent Care

## 2022-06-23 DIAGNOSIS — I10 Essential (primary) hypertension: Secondary | ICD-10-CM | POA: Diagnosis not present

## 2022-06-23 DIAGNOSIS — Z01818 Encounter for other preprocedural examination: Secondary | ICD-10-CM | POA: Insufficient documentation

## 2022-06-23 DIAGNOSIS — Z01812 Encounter for preprocedural laboratory examination: Secondary | ICD-10-CM

## 2022-06-23 DIAGNOSIS — D508 Other iron deficiency anemias: Secondary | ICD-10-CM | POA: Diagnosis not present

## 2022-06-23 LAB — BASIC METABOLIC PANEL
Anion gap: 6 (ref 5–15)
BUN: 29 mg/dL — ABNORMAL HIGH (ref 8–23)
CO2: 24 mmol/L (ref 22–32)
Calcium: 9 mg/dL (ref 8.9–10.3)
Chloride: 109 mmol/L (ref 98–111)
Creatinine, Ser: 1.48 mg/dL — ABNORMAL HIGH (ref 0.61–1.24)
GFR, Estimated: 48 mL/min — ABNORMAL LOW (ref >60)
Glucose, Bld: 89 mg/dL (ref 70–99)
Potassium: 4.1 mmol/L (ref 3.5–5.1)
Sodium: 139 mmol/L (ref 135–145)

## 2022-06-23 LAB — CBC
HCT: 37.5 % — ABNORMAL LOW (ref 39.0–52.0)
Hemoglobin: 11.6 g/dL — ABNORMAL LOW (ref 13.0–17.0)
MCH: 32.1 pg (ref 26.0–34.0)
MCHC: 30.9 g/dL (ref 30.0–36.0)
MCV: 103.9 fL — ABNORMAL HIGH (ref 80.0–100.0)
Platelets: 359 10*3/uL (ref 150–400)
RBC: 3.61 MIL/uL — ABNORMAL LOW (ref 4.22–5.81)
RDW: 13.6 % (ref 11.5–15.5)
WBC: 12.9 10*3/uL — ABNORMAL HIGH (ref 4.0–10.5)
nRBC: 0 % (ref 0.0–0.2)

## 2022-06-23 MED ORDER — CHLORHEXIDINE GLUCONATE 0.12 % MT SOLN
15.0000 mL | Freq: Once | OROMUCOSAL | Status: AC
  Administered 2022-06-24: 15 mL via OROMUCOSAL

## 2022-06-23 MED ORDER — SODIUM CHLORIDE 0.9 % IV SOLN
1.0000 g | INTRAVENOUS | Status: AC
  Administered 2022-06-24: 1 g via INTRAVENOUS
  Filled 2022-06-23: qty 1

## 2022-06-23 MED ORDER — FAMOTIDINE 20 MG PO TABS
20.0000 mg | ORAL_TABLET | Freq: Once | ORAL | Status: AC
  Administered 2022-06-24: 20 mg via ORAL

## 2022-06-23 MED ORDER — ORAL CARE MOUTH RINSE: 15.0000 mL | Freq: Once | OROMUCOSAL | Status: AC

## 2022-06-23 MED ORDER — LACTATED RINGERS IV SOLN: INTRAVENOUS | Status: DC

## 2022-06-24 ENCOUNTER — Encounter: Payer: Self-pay | Admitting: Urology

## 2022-06-24 ENCOUNTER — Encounter
Admission: RE | Disposition: A | Discharge status: Home / Self Care | Payer: Self-pay | Source: Home / Self Care | Attending: Urology

## 2022-06-24 ENCOUNTER — Ambulatory Visit
Admission: RE | Admit: 2022-06-24 | Discharge: 2022-06-24 | Disposition: A | Discharge status: Home / Self Care | Payer: Medicare Other | Attending: Urology | Admitting: Urology

## 2022-06-24 ENCOUNTER — Ambulatory Visit: Payer: Medicare Other | Admitting: Urgent Care

## 2022-06-24 ENCOUNTER — Other Ambulatory Visit: Payer: Self-pay

## 2022-06-24 DIAGNOSIS — Z87440 Personal history of urinary (tract) infections: Secondary | ICD-10-CM | POA: Insufficient documentation

## 2022-06-24 DIAGNOSIS — I2510 Atherosclerotic heart disease of native coronary artery without angina pectoris: Secondary | ICD-10-CM | POA: Insufficient documentation

## 2022-06-24 DIAGNOSIS — N3289 Other specified disorders of bladder: Secondary | ICD-10-CM | POA: Insufficient documentation

## 2022-06-24 DIAGNOSIS — N39498 Other specified urinary incontinence: Secondary | ICD-10-CM | POA: Diagnosis not present

## 2022-06-24 DIAGNOSIS — Z951 Presence of aortocoronary bypass graft: Secondary | ICD-10-CM | POA: Insufficient documentation

## 2022-06-24 DIAGNOSIS — R310 Gross hematuria: Secondary | ICD-10-CM | POA: Insufficient documentation

## 2022-06-24 DIAGNOSIS — R3915 Urgency of urination: Secondary | ICD-10-CM | POA: Diagnosis not present

## 2022-06-24 DIAGNOSIS — N401 Enlarged prostate with lower urinary tract symptoms: Secondary | ICD-10-CM | POA: Diagnosis not present

## 2022-06-24 DIAGNOSIS — I452 Bifascicular block: Secondary | ICD-10-CM | POA: Insufficient documentation

## 2022-06-24 DIAGNOSIS — Z8546 Personal history of malignant neoplasm of prostate: Secondary | ICD-10-CM | POA: Insufficient documentation

## 2022-06-24 DIAGNOSIS — R350 Frequency of micturition: Secondary | ICD-10-CM | POA: Diagnosis not present

## 2022-06-24 DIAGNOSIS — Z6828 Body mass index (BMI) 28.0-28.9, adult: Secondary | ICD-10-CM | POA: Diagnosis not present

## 2022-06-24 DIAGNOSIS — Z87891 Personal history of nicotine dependence: Secondary | ICD-10-CM | POA: Insufficient documentation

## 2022-06-24 DIAGNOSIS — I447 Left bundle-branch block, unspecified: Secondary | ICD-10-CM | POA: Diagnosis not present

## 2022-06-24 DIAGNOSIS — R32 Unspecified urinary incontinence: Secondary | ICD-10-CM

## 2022-06-24 DIAGNOSIS — I10 Essential (primary) hypertension: Secondary | ICD-10-CM | POA: Diagnosis not present

## 2022-06-24 DIAGNOSIS — E785 Hyperlipidemia, unspecified: Secondary | ICD-10-CM | POA: Diagnosis not present

## 2022-06-24 DIAGNOSIS — E669 Obesity, unspecified: Secondary | ICD-10-CM | POA: Insufficient documentation

## 2022-06-24 DIAGNOSIS — Z8551 Personal history of malignant neoplasm of bladder: Secondary | ICD-10-CM | POA: Diagnosis present

## 2022-06-24 HISTORY — DX: Aneurysm of iliac artery: I72.3

## 2022-06-24 HISTORY — DX: Aneurysm of carotid artery: I72.0

## 2022-06-24 HISTORY — DX: Atherosclerosis of aorta: I70.0

## 2022-06-24 HISTORY — PX: CYSTOSCOPY WITH BIOPSY: SHX5122

## 2022-06-24 HISTORY — PX: TRANSURETHRAL RESECTION OF BLADDER TUMOR: SHX2575

## 2022-06-24 HISTORY — DX: Infrarenal abdominal aortic aneurysm, without rupture: I71.43

## 2022-06-24 HISTORY — PX: TRANSURETHRAL RESECTION OF BLADDER TUMOR WITH GYRUS (TURBT-GYRUS): SHX6458

## 2022-06-24 HISTORY — PX: CYSTOSCOPY: SHX5120

## 2022-06-24 HISTORY — PX: CYSTOSCOPY WITH FULGERATION: SHX6638

## 2022-06-24 SURGERY — CYSTOSCOPY
Anesthesia: General

## 2022-06-24 MED ORDER — SODIUM CHLORIDE 0.9 % IR SOLN
Status: DC | PRN
  Administered 2022-06-24 (×3): 3000 mL via INTRAVESICAL

## 2022-06-24 MED ORDER — ROCURONIUM BROMIDE 10 MG/ML (PF) SYRINGE
PREFILLED_SYRINGE | INTRAVENOUS | Status: AC
  Filled 2022-06-24: qty 10

## 2022-06-24 MED ORDER — FENTANYL CITRATE (PF) 100 MCG/2ML IJ SOLN
INTRAMUSCULAR | Status: DC | PRN
  Administered 2022-06-24 (×3): 50 ug via INTRAVENOUS

## 2022-06-24 MED ORDER — CHLORHEXIDINE GLUCONATE 0.12 % MT SOLN
OROMUCOSAL | Status: AC
  Filled 2022-06-24: qty 15

## 2022-06-24 MED ORDER — DEXAMETHASONE SODIUM PHOSPHATE 10 MG/ML IJ SOLN
INTRAMUSCULAR | Status: DC | PRN
  Administered 2022-06-24: 10 mg via INTRAVENOUS

## 2022-06-24 MED ORDER — FENTANYL CITRATE (PF) 100 MCG/2ML IJ SOLN: 25.0000 ug | INTRAMUSCULAR | Status: DC | PRN

## 2022-06-24 MED ORDER — ROCURONIUM BROMIDE 100 MG/10ML IV SOLN
INTRAVENOUS | Status: DC | PRN
  Administered 2022-06-24: 20 mg via INTRAVENOUS
  Administered 2022-06-24: 50 mg via INTRAVENOUS

## 2022-06-24 MED ORDER — ACETAMINOPHEN 10 MG/ML IV SOLN: 1000.0000 mg | Freq: Once | INTRAVENOUS | Status: DC | PRN

## 2022-06-24 MED ORDER — ACETAMINOPHEN 10 MG/ML IV SOLN
INTRAVENOUS | Status: DC | PRN
  Administered 2022-06-24: 1000 mg via INTRAVENOUS

## 2022-06-24 MED ORDER — FAMOTIDINE 20 MG PO TABS
ORAL_TABLET | ORAL | Status: AC
  Filled 2022-06-24: qty 1

## 2022-06-24 MED ORDER — EPHEDRINE 5 MG/ML INJ
INTRAVENOUS | Status: AC
  Filled 2022-06-24: qty 5

## 2022-06-24 MED ORDER — KETOROLAC TROMETHAMINE 30 MG/ML IJ SOLN
INTRAMUSCULAR | Status: DC | PRN
  Administered 2022-06-24: 30 mg via INTRAVENOUS

## 2022-06-24 MED ORDER — ACETAMINOPHEN 10 MG/ML IV SOLN
INTRAVENOUS | Status: AC
  Filled 2022-06-24: qty 100

## 2022-06-24 MED ORDER — ONDANSETRON HCL 4 MG/2ML IJ SOLN: 4.0000 mg | Freq: Once | INTRAMUSCULAR | Status: DC | PRN

## 2022-06-24 MED ORDER — HYDROCODONE-ACETAMINOPHEN 5-325 MG PO TABS: 1.0000 | ORAL_TABLET | Freq: Four times a day (QID) | ORAL | 0 refills | Status: DC | PRN

## 2022-06-24 MED ORDER — OXYCODONE HCL 5 MG/5ML PO SOLN: 5.0000 mg | Freq: Once | ORAL | Status: DC | PRN

## 2022-06-24 MED ORDER — OXYCODONE HCL 5 MG PO TABS: 5.0000 mg | ORAL_TABLET | Freq: Once | ORAL | Status: DC | PRN

## 2022-06-24 MED ORDER — LIDOCAINE HCL (CARDIAC) PF 100 MG/5ML IV SOSY
PREFILLED_SYRINGE | INTRAVENOUS | Status: DC | PRN
  Administered 2022-06-24: 100 mg via INTRAVENOUS

## 2022-06-24 MED ORDER — ONDANSETRON HCL 4 MG/2ML IJ SOLN
INTRAMUSCULAR | Status: DC | PRN
  Administered 2022-06-24: 4 mg via INTRAVENOUS

## 2022-06-24 MED ORDER — EPHEDRINE SULFATE (PRESSORS) 50 MG/ML IJ SOLN
INTRAMUSCULAR | Status: DC | PRN
  Administered 2022-06-24: 10 mg via INTRAVENOUS
  Administered 2022-06-24: 5 mg via INTRAVENOUS

## 2022-06-24 MED ORDER — FENTANYL CITRATE (PF) 100 MCG/2ML IJ SOLN
INTRAMUSCULAR | Status: AC
  Filled 2022-06-24: qty 2

## 2022-06-24 MED ORDER — PROPOFOL 10 MG/ML IV BOLUS
INTRAVENOUS | Status: DC | PRN
  Administered 2022-06-24: 120 mg via INTRAVENOUS

## 2022-06-24 MED ORDER — SUGAMMADEX SODIUM 200 MG/2ML IV SOLN
INTRAVENOUS | Status: DC | PRN
  Administered 2022-06-24: 200 mg via INTRAVENOUS

## 2022-06-24 SURGICAL SUPPLY — 35 items
BAG DRAIN SIEMENS DORNER NS (MISCELLANEOUS) ×1 IMPLANT
BAG DRN NS LF (MISCELLANEOUS) ×1
BAG DRN RND TRDRP ANRFLXCHMBR (UROLOGICAL SUPPLIES) ×1
BAG URINE DRAIN 2000ML AR STRL (UROLOGICAL SUPPLIES) ×1 IMPLANT
BRUSH SCRUB EZ 1% IODOPHOR (MISCELLANEOUS) ×1 IMPLANT
CATH FOL 2WAY LX 20X30 (CATHETERS) IMPLANT
DRSG TELFA 3X4 N-ADH STERILE (GAUZE/BANDAGES/DRESSINGS) ×1 IMPLANT
ELECT BIVAP BIPO 22/24 DONUT (ELECTROSURGICAL) ×1
ELECT COAG BIPOLAR CYL 1.2MMM (ELECTROSURGICAL) ×1
ELECT REM PT RETURN 9FT ADLT (ELECTROSURGICAL) ×1
ELECTRD BIVAP BIPO 22/24 DONUT (ELECTROSURGICAL) IMPLANT
ELECTRODE COAG BIPLR CYL 1.2MM (ELECTROSURGICAL) IMPLANT
ELECTRODE REM PT RTRN 9FT ADLT (ELECTROSURGICAL) ×1 IMPLANT
GLOVE BIOGEL PI IND STRL 7.5 (GLOVE) ×1 IMPLANT
GOWN STRL REUS W/ TWL LRG LVL3 (GOWN DISPOSABLE) ×2 IMPLANT
GOWN STRL REUS W/ TWL XL LVL3 (GOWN DISPOSABLE) ×1 IMPLANT
GOWN STRL REUS W/TWL LRG LVL3 (GOWN DISPOSABLE) ×2
GOWN STRL REUS W/TWL XL LVL3 (GOWN DISPOSABLE) ×1
GOWN STRL REUS W/TWL XL LVL4 (GOWN DISPOSABLE) ×1 IMPLANT
GUIDEWIRE STR DUAL SENSOR (WIRE) ×2 IMPLANT
IV NS IRRIG 3000ML ARTHROMATIC (IV SOLUTION) ×2 IMPLANT
KIT TURNOVER CYSTO (KITS) ×1 IMPLANT
LOOP CUT BIPOLAR 24F LRG (ELECTROSURGICAL) IMPLANT
PACK CYSTO AR (MISCELLANEOUS) ×1 IMPLANT
SET CYSTO W/LG BORE CLAMP LF (SET/KITS/TRAYS/PACK) ×1 IMPLANT
SET IRRIG Y TYPE TUR BLADDER L (SET/KITS/TRAYS/PACK) ×1 IMPLANT
SHEATH NAVIGATOR HD 12/14X36 (SHEATH) ×1 IMPLANT
SURGILUBE 2OZ TUBE FLIPTOP (MISCELLANEOUS) ×1 IMPLANT
SYR 10ML LL (SYRINGE) ×1 IMPLANT
SYR TOOMEY IRRIG 70ML (MISCELLANEOUS) ×1
SYRINGE TOOMEY IRRIG 70ML (MISCELLANEOUS) ×1 IMPLANT
TRAP FLUID SMOKE EVACUATOR (MISCELLANEOUS) ×1 IMPLANT
WATER STERILE IRR 1000ML POUR (IV SOLUTION) ×1 IMPLANT
WATER STERILE IRR 3000ML UROMA (IV SOLUTION) ×1 IMPLANT
WATER STERILE IRR 500ML POUR (IV SOLUTION) ×1 IMPLANT

## 2022-06-24 NOTE — Discharge Instructions (Addendum)
Bladder Biopsy   Definition:  Transurethral Resection of the Bladder Tumor is a surgical procedure used to diagnose and remove tumors within the bladder.   General instructions:     Your recent bladder surgery requires very little post hospital care but some definite precautions.  Despite the fact that no skin incisions were used, the area around the bladder incisions are raw and covered with scabs to promote healing and prevent bleeding. Certain precautions are needed to insure that the scabs are not disturbed over the next 2-4 weeks while the healing proceeds.  Because the raw surface inside your bladder and the irritating effects of urine you may expect frequency of urination and/or urgency (a stronger desire to urinate) and perhaps even getting up at night more often. This will usually resolve or improve slowly over the healing period. You may see some blood in your urine over the first 6 weeks. Do not be alarmed, even if the urine was clear for a while. Get off your feet and drink lots of fluids until clearing occurs. If you start to pass clots or don't improve call us.  Diet:  You may return to your normal diet immediately. Because of the raw surface of your bladder, alcohol, spicy foods, foods high in acid and drinks with caffeine may cause irritation or frequency and should be used in moderation. To keep your urine flowing freely and avoid constipation, drink plenty of fluids during the day (8-10 glasses). Tip: Avoid cranberry juice because it is very acidic.  Activity:  Your physical activity doesn't need to be restricted. However, if you are very active, you may see some blood in the urine. We suggest that you reduce your activity under the circumstances until the bleeding has stopped.  Bowels:  It is important to keep your bowels regular during the postoperative period. Straining with bowel movements can cause bleeding. A bowel movement every other day is reasonable. Use a mild  laxative if needed, such as milk of magnesia 2-3 tablespoons, or 2 Dulcolax tablets. Call if you continue to have problems. If you had been taking narcotics for pain, before, during or after your surgery, you may be constipated. Take a laxative if necessary.    Medication:  You should resume your pre-surgery medications unless told not to. In addition you may be given an antibiotic to prevent or treat infection. Antibiotics are not always necessary. All medication should be taken as prescribed until the bottles are finished unless you are having an unusual reaction to one of the drugs. A prescription for pain medication was sent to your pharmacy if needed   [facility] [address] [place], [place] [address] [phone]  AMBULATORY SURGERY  DISCHARGE INSTRUCTIONS   The drugs that you were given will stay in your system until tomorrow so for the next 24 hours you should not:  Drive an automobile Make any legal decisions Drink any alcoholic beverage   You may resume regular meals tomorrow.  Today it is better to start with liquids and gradually work up to solid foods.  You may eat anything you prefer, but it is better to start with liquids, then soup and crackers, and gradually work up to solid foods.   Please notify your doctor immediately if you have any unusual bleeding, trouble breathing, redness and pain at the surgery site, drainage, fever, or pain not relieved by medication.    Additional Instructions:        Please contact your physician  with any problems or Same Day Surgery at [phone], Monday through Friday 6 am to 4 pm, or [place] [facility] [place] Main number at [phone]. 

## 2022-06-24 NOTE — Interval H&P Note (Signed)
History and Physical Interval Note:  CV: RRR Lungs: Clear  06/24/2022 8:53 AM  [name]  has presented today for surgery, with the diagnosis of Hematuria, History of Bladder Cancer.  The various methods of treatment have been discussed with the patient and family. After consideration of risks, benefits and other options for treatment, the patient has consented to  Procedure(s): DIAGNOSTIC CYSTOSCOPY (N/A) TRANSURETHRAL RESECTION OF BLADDER TUMOR (TURBT) (N/A) CYSTOSCOPY WITH BLADDER BIOPSY (N/A) CYSTOSCOPY WITH FULGERATION (N/A) as a surgical intervention.  The patient's history has been reviewed, patient examined, no change in status, stable for surgery.  I have reviewed the patient's chart and labs.  Questions were answered to the patient's satisfaction.     Ayriel Texidor C Ileanna Gemmill

## 2022-06-24 NOTE — Op Note (Signed)
   Preoperative diagnosis:  History of recurrent low-grade urothelial carcinoma bladder Recurrent UTI Recurrent gross hematuria History prostate cancer Urinary incontinence  Postoperative diagnosis:  Same  Procedure: Cystoscopy with bladder biopsies/fulguration  Surgeon: [name], MD  Anesthesia: General  Complications: None  Intraoperative findings:  Cystoscopy-urethra normal caliber without stricture; prostate with mild lateral lobe enlargement; open bladder neck.  Diffuse hemorrhagic areas with oozing posterior bladder wall  EBL: Minimal  Specimens: Bladder biopsies posterior wall  Indication: [name] is a 77 y.o. male with a history of recurrent low-grade urothelial carcinoma the bladder.  Over the last few months he has had worsening frequency urgency and urinary incontinence along with recurrent UTIs and gross hematuria.  He has had 2 office surveillance cystoscopy was postponed secondary to UTI.  He is completing a course of cefuroxime and presents for cystoscopy under anesthesia and possible TURBT.  After reviewing the management options for treatment, he elected to proceed with the above surgical procedure(s). We have discussed the potential benefits and risks of the procedure, side effects of the proposed treatment, the likelihood of the patient achieving the goals of the procedure, and any potential problems that might occur during the procedure or recuperation. Informed consent has been obtained.  Description of procedure:  The patient was taken to the operating room and general anesthesia was induced.  The patient was placed in the dorsal lithotomy position, prepped and draped in the usual sterile fashion, and preoperative antibiotics were administered. A preoperative time-out was performed.  He was noted to have continuous leakage per urethra and suprapubic pressure generated a urinary stream.  A 21 French cystoscope was lubricated, passed per urethra  and advanced proximally under direct vision with findings as described above.  The cystoscope was then removed and was replaced with a 24 French continuous-flow resectoscope sheath with visual obturator however the distal urethra required dilation with [employer] sounds from 22-30 [place].  After dilation the resectoscope sheath was advanced without difficulty.  The bladder again was reinspected and no solid or papillary lesions were identified.  Multiple cold biopsies were taken of the posterior wall bladder erythema.  The biopsy sites and hemorrhagic areas were fulgurated with a button electrode.  At the completion of the procedure hemostasis was noted to be adequate.  Repeat cystoscopy was performed and no papillary lesions were identified.  The UOs were normal-appearing bilaterally.  The resectoscope was removed and a 20 [place] Foley catheter was placed with return of pink-tinged effluent upon irrigation.  After anesthetic reversal he was transported to the PACU in stable condition.  Plan: Will discharge home with an indwelling Foley.  He will be contacted for appointment for catheter removal in the next 3-5 days.  He had requested short-term catheterization due to his urinary incontinence.   [name], M.D.

## 2022-06-24 NOTE — Anesthesia Preprocedure Evaluation (Signed)
Anesthesia Evaluation  Patient identified by MRN, date of birth, ID band Patient awake    Reviewed: Allergy & Precautions, NPO status , Patient's Chart, lab work & pertinent test results  History of Anesthesia Complications Negative for: history of anesthetic complications  Airway Mallampati: IV   Neck ROM: Full    Dental  (+) Missing   Pulmonary neg pulmonary ROS, neg sleep apnea, neg COPD, Patient abstained from smoking.Not current smoker, former smoker   Pulmonary exam normal breath sounds clear to auscultation       Cardiovascular Exercise Tolerance: Good METShypertension, + CAD (s/p CABG 2004)  (-) Past MI Normal cardiovascular exam(-) dysrhythmias  Rhythm:Regular Rate:Normal  ECG 11/20/20: NSR, LBBB, LAFB, bifascicular block; unchanged from prior  Myocardial perfusion 09/22/18:  1.  Gated ETT  2.  Mild reduced left ventricular function  3.  Inferior wall hypokinesis  4.  Moderate inferior scar without evidence for ischemia   Neuro/Psych negative neurological ROS  negative psych ROS   GI/Hepatic negative GI ROS,neg GERD  ,,(+)     (-) substance abuse    Endo/Other  neg diabetes  Obesity   Renal/GU negative Renal ROS   Prostate CA; Bladder CA    Musculoskeletal   Abdominal   Peds  Hematology negative hematology ROS (+)   Anesthesia Other Findings Cardiology note 04/02/21:  77 year old gentleman with known coronary disease, status post CABG, without exertional chest pain. ETT sestamibi study 04/11/2016 revealed mildly reduced left ventricular function, with moderate inferior scar without ischemia, which was unchanged from previous study. The patient has essential hypertension, blood pressure low today on current BP medications. The patient has hyperlipidemia, with excellent control of LDL cholesterol on atorvastatin.  Plan   1. Continue current medications 2. Recommend low sodium diet 3. Continue  atorvastatin for hyperlipidemia management  4. Heart healthy diet printed instructions given to the patient 5. Continue regular exercise 6. Return to clinic for follow-up in 6 months  No orders of the defined types were placed in this encounter.  Return in about 6 months (around 09/30/2021).    Reproductive/Obstetrics                              Anesthesia Physical Anesthesia Plan  ASA: 3  Anesthesia Plan: General   Post-op Pain Management:    Induction: Intravenous  PONV Risk Score and Plan: 3 and Ondansetron, Dexamethasone and Treatment may vary due to age or medical condition  Airway Management Planned: Oral ETT  Additional Equipment:   Intra-op Plan:   Post-operative Plan: Extubation in OR  Informed Consent: I have reviewed the patients History and Physical, chart, labs and discussed the procedure including the risks, benefits and alternatives for the proposed anesthesia with the patient or authorized representative who has indicated his/her understanding and acceptance.     Dental advisory given  Plan Discussed with: CRNA  Anesthesia Plan Comments: (Patient consented for risks of anesthesia including but not limited to:  - adverse reactions to medications - damage to eyes, teeth, lips or other oral mucosa - nerve damage due to positioning  - sore throat or hoarseness - damage to heart, brain, nerves, lungs, other parts of body or loss of life  Informed patient about role of CRNA in peri- and intra-operative care.  Patient voiced understanding.)         Anesthesia Quick Evaluation

## 2022-06-24 NOTE — H&P (Signed)
Urology H&P  Urologic history: 1.  Recurrent Ta urothelial carcinoma bladder (available record review) TURBT July 2011; low-grade Ta TURBT December 2014; low-grade Ta; postresection mitomycin TURBT 07/2015; low-grade Ta; intravesical gemcitabine x6 weeks TURBT 10/2020;Ta low-grade urothelial carcinoma with small focus suggestive of high-grade features; repeat intravesical gemcitabine 6 completed 01/07/2021 Bx w/ fulguration 07/17/2021; Ta low grade    2.  Intermediate risk adenocarcinoma prostate -Treated IMRT+ ADT -Last PSA 12/24/2020 0.03  History of Present Illness: [name] is a 77 y.o. with a history of recurrent low-grade urothelial carcinoma the bladder.  Last cystoscopy 02/19/2022 showed no evidence of recurrent bladder tumor.  At the time of his last cystoscopy he had had a 59-month history of worsening lower urinary tract symptoms, urinary incontinence and recurrent UTI.  He had an episode of gross hematuria and urinary retention in late February 2024.  Residual has been elevated however when in office late last month PVR was ~ 80 cc.  Still complains of urgency and unaware incontinence between voids.  His last 2 office cystoscopies were canceled secondary to UTI and cloudy urine.  He is presently completing antibiotic course and cystoscopy under anesthesia was recommended with possible bladder biopsy/TURBT.   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 4v CABG on 04/13/2002. b.) LHC 02/03/2013 --> LVEF 50%;  severe 3v CAD with patent LIMA-LAD and SVG-OM1 grafts; SVG-PDA and SVG-DG1 occluded with collaterals to PDA and DG1 from LAD.   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   a.) LIMA-LAD, SVG-OM1, SVG-D1, SVG-PDA   Tubular adenoma of colon 02/03/2007    Past Surgical History:  Procedure Laterality Date   CARDIAC CATHETERIZATION Left 04/13/2002   Procedure: CARDIAC CATHETERIZATION; Location: [facility]; Surgeon; Marcina Millard, MD   CARDIAC CATHETERIZATION Left 02/03/2013   Procedure: CARDIAC CATHETERIZATION; Location: [facility]; Surgeon: Marcina Millard, MD   COLONOSCOPY W/ POLYPECTOMY     COLONOSCOPY WITH PROPOFOL N/A 04/09/2015   Procedure: COLONOSCOPY WITH PROPOFOL;  Surgeon: Wallace Cullens, MD;  Location[facility] ENDOSCOPY;  Service: Gastroenterology;  Laterality: N/A;   CORONARY ANGIOPLASTY     CORONARY ARTERY BYPASS GRAFT N/A 04/13/2002   Procedure: 4v CABG (LIMA-LAD, SVG-OM1, SVG-D1, SVG-PDA); Location: [facility]; Surgeon: Rana Snare, MD   CYSTOSCOPY N/A 07/17/2021   Procedure: Bluford Kaufmann;  Surgeon: [name], MD;  Location: [facility] ORS;  Service: Urology;  Laterality: N/A;   CYSTOSCOPY WITH BIOPSY N/A 07/17/2021   Procedure: CYSTOSCOPY WITH BIOPSY;  Surgeon: [name], MD;  Location: [facility] ORS;  Service: Urology;  Laterality: N/A;   CYSTOSCOPY WITH FULGERATION N/A 07/17/2021   Procedure: CYSTOSCOPY WITH FULGERATION;  Surgeon: [name], MD;  Location: [facility] ORS;  Service: Urology;  Laterality: N/A;   EYE SURGERY     both eyes with cataract with lens 2021   TONSILLECTOMY     TRANSURETHRAL RESECTION OF BLADDER TUMOR N/A 11/13/2020   Procedure: TRANSURETHRAL RESECTION OF BLADDER TUMOR (TURBT);  Surgeon: [name], MD;  Location: [facility] ORS;  Service: Urology;  Laterality: N/A;    TRANSURETHRAL RESECTION OF PROSTATE      Home Medications:  No outpatient medications have been marked as taking for the 06/24/22 encounter [facility] Encounter).    Allergies:  Allergies  Allergen Reactions   Cidex [Glutaral] Anaphylaxis   Other Anaphylaxis    - Ortho-phthalaldehyde  - trospion cause AMS    Family History  Problem Relation Age of Onset   Prostate cancer Father        Metastatic   Cancer Father     Social History:  reports that he quit smoking about 16 years ago. His smoking use included cigarettes. He has a 45.00 pack-year smoking history. He has been exposed to tobacco smoke. He has never used smokeless tobacco. He reports current alcohol use of about 2.0 standard drinks of alcohol per week. He reports that he does not use drugs.  ROS: Noncontributory except as per the HPI  Physical Exam:  Vital signs in last 24 hours:   Constitutional:  Alert and oriented, No acute distress HEENT: [place] AT, moist mucus membranes.  Psychiatric: Normal mood and affect   Laboratory Data:  Recent Labs    06/23/22 1018  WBC 12.9*  HGB 11.6*  HCT 37.5*   Recent Labs    06/23/22 1018  NA 139  K 4.1  CL 109  CO2 24  GLUCOSE 89  BUN 29*  CREATININE 1.48*  CALCIUM 9.0   No results for input(s): "LABPT", "INR" in the last 72 hours. No results for input(s): "LABURIN" in the last 72 hours. Results for orders placed or performed in visit on 06/12/22  Microscopic Examination     Status: Abnormal   Collection Time: 06/12/22  1:18 PM   Urine  Result Value Ref Range Status   WBC, UA >30 (A) 0 - 5 /hpf Final   RBC, Urine 11-30 (A) 0 - 2 /hpf Final   Epithelial Cells (non renal) 0-10 0 - 10 /hpf Final   Bacteria, UA Many (A) None seen/Few Final  CULTURE, URINE COMPREHENSIVE     Status: Abnormal   Collection Time: 06/12/22  1:40 PM   Specimen: Urine   UR  Result Value Ref Range Status   Urine Culture, Comprehensive Final report (A)  Final   Organism ID, Bacteria  Escherichia coli (A)  Final    Comment: Cefazolin <=4 ug/mL Cefazolin with an MIC <=16 predicts susceptibility to the oral agents cefaclor, cefdinir, cefpodoxime, cefprozil, cefuroxime, cephalexin, and loracarbef when used for therapy of uncomplicated urinary tract infections due to E. coli, Klebsiella pneumoniae, and Proteus mirabilis. 25,000-50,000 colony forming units per mL    ANTIMICROBIAL SUSCEPTIBILITY Comment  Final    Comment:       ** S = Susceptible; I = Intermediate; R = Resistant **                    P = Positive; N = Negative             MICS are expressed in micrograms per mL    Antibiotic  RSLT#1    RSLT#2    RSLT#3    RSLT#4 Amoxicillin/Clavulanic Acid    S Ampicillin                     S Cefepime                       S Ceftriaxone                    S Cefuroxime                     S Ciprofloxacin                  S Ertapenem                      S Gentamicin                     R Imipenem                       S Levofloxacin                   I Meropenem                      S Nitrofurantoin                 S Piperacillin/Tazobactam        S Tetracycline                   R Tobramycin                     I Trimethoprim/Sulfa             R     Impression/Assessment:  History recurrent low-grade urothelial carcinoma the bladder Recurrent UTI  Plan:  Cystoscopy under anesthesia with possible bladder biopsy/TURBT.  The procedure has been discussed in detail including potential risks of bleeding, infection.   06/24/2022, 7:26 AM  Irineo Axon,  MD

## 2022-06-24 NOTE — Anesthesia Procedure Notes (Signed)
Procedure Name: Intubation Date/Time: 06/24/2022 9:04 AM  Performed by: Katherine Basset, CRNAPre-anesthesia Checklist: Patient identified, Emergency Drugs available, Suction available and Patient being monitored Patient Re-evaluated:Patient Re-evaluated prior to induction Oxygen Delivery Method: Circle system utilized Preoxygenation: Pre-oxygenation with 100% oxygen Induction Type: IV induction Ventilation: Mask ventilation without difficulty and Oral airway inserted - appropriate to patient size Laryngoscope Size: Hyacinth Meeker and 2 Grade View: Grade I Tube type: Oral Tube size: 7.0 mm Number of attempts: 1 Airway Equipment and Method: Stylet, Oral airway and Bite block Placement Confirmation: ETT inserted through vocal cords under direct vision, positive ETCO2 and breath sounds checked- equal and bilateral Secured at: 22 cm Tube secured with: Tape Dental Injury: Teeth and Oropharynx as per pre-operative assessment

## 2022-06-24 NOTE — Transfer of Care (Signed)
Immediate Anesthesia Transfer of Care Note  Patient: MARKIAN CORDLE  Procedure(s) Performed: DIAGNOSTIC CYSTOSCOPY TRANSURETHRAL RESECTION OF BLADDER TUMOR (TURBT) CYSTOSCOPY WITH BLADDER BIOPSY CYSTOSCOPY WITH FULGERATION  Patient Location: PACU  Anesthesia Type:General  Level of Consciousness: awake, alert , and oriented  Airway & Oxygen Therapy: Patient Spontanous Breathing and Patient connected to face mask oxygen  Post-op Assessment: Report given to RN, Post -op Vital signs reviewed and stable, and Patient moving all extremities  Post vital signs: Reviewed and stable  Last Vitals:  Vitals Value Taken Time  BP 150/88 06/24/22 1018  Temp 36.6 C 06/24/22 1018  Pulse 83 06/24/22 1021  Resp 15 06/24/22 1021  SpO2 99 % 06/24/22 1021  Vitals shown include unvalidated device data.  Last Pain:  Vitals:   06/24/22 0742  TempSrc: Temporal  PainSc: 0-No pain      Patients Stated Pain Goal: 0 (06/24/22 0742)  Complications: No notable events documented.

## 2022-06-25 ENCOUNTER — Encounter: Payer: Self-pay | Admitting: Urology

## 2022-06-25 ENCOUNTER — Encounter: Payer: Self-pay | Admitting: *Deleted

## 2022-06-25 LAB — SURGICAL PATHOLOGY

## 2022-06-27 ENCOUNTER — Ambulatory Visit (INDEPENDENT_AMBULATORY_CARE_PROVIDER_SITE_OTHER): Payer: Medicare Other | Admitting: Urology

## 2022-06-27 ENCOUNTER — Encounter: Payer: Self-pay | Admitting: Urology

## 2022-06-27 VITALS — BP 154/78 | HR 59 | Ht 66.0 in | Wt 173.0 lb

## 2022-06-27 DIAGNOSIS — C679 Malignant neoplasm of bladder, unspecified: Secondary | ICD-10-CM

## 2022-06-27 NOTE — Patient Instructions (Signed)
These are the three normal postoperative findings after this surgery: Burning or pain with urination: This typically resolves within 1 week of surgery. If you are still having significant pain with urination 10 days after surgery, please call our clinic. We may need to check you for a urinary tract infection at that point, though this is rare. Blood in the urine: This may either come and go or steadily improve before going away, but typically resolves completely within 3 weeks of surgery. If you are on blood thinners, it may take longer for the bleeding to resolve. Please note that you may find that you pass clumps of tissue or debris around 10-14 days after surgery associated with some new bleeding. This is due to sloughing of your postoperative scab and is also normal. If at any point you start to pass dark red urine; thick, ketchup-like urine; or large blood clots around the size of your palm, please call our office immediately. Urinary leakage or urgency: This tends to improve with time, with most patients becoming dry within around 3 months of surgery. You may wear absorbant underwear or liners for security during this time. To help you get dry faster, please make sure you are completing your Kegel exercises as instructed, with a set of 10 exercises completed up to three times daily. Further information on Kegel exercises can be found in the back of this packet.

## 2022-06-27 NOTE — Progress Notes (Signed)
Catheter Removal  Patient is present today for a catheter removal.  10 ml of water was drained from the balloon. A 20 FR foley cath was removed from the bladder, no complications were noted. Patient tolerated well.  Performed by: Michiel Cowboy, PA-C   Follow up/ Additional notes:  Follow up as directed with Dr. Lonna Cobb on the 17th.

## 2022-06-28 NOTE — Anesthesia Postprocedure Evaluation (Signed)
Anesthesia Post Note  Patient: BROC PARKS  Procedure(s) Performed: DIAGNOSTIC CYSTOSCOPY TRANSURETHRAL RESECTION OF BLADDER TUMOR (TURBT) CYSTOSCOPY WITH BLADDER BIOPSY CYSTOSCOPY WITH FULGERATION  Patient location during evaluation: PACU Anesthesia Type: General Level of consciousness: awake and alert Pain management: pain level controlled Vital Signs Assessment: post-procedure vital signs reviewed and stable Respiratory status: spontaneous breathing, nonlabored ventilation, respiratory function stable and patient connected to nasal cannula oxygen Cardiovascular status: blood pressure returned to baseline and stable Postop Assessment: no apparent nausea or vomiting Anesthetic complications: no   No notable events documented.   Last Vitals:  Vitals:   06/24/22 1045 06/24/22 1104  BP: 136/77 139/86  Pulse: 71 67  Resp: 15 18  Temp:  36.8 C  SpO2: 93% 96%    Last Pain:  Vitals:   06/25/22 1611  TempSrc:   PainSc: 0-No pain                 Lenard Simmer

## 2022-07-04 ENCOUNTER — Encounter: Payer: Self-pay | Admitting: Urology

## 2022-07-04 ENCOUNTER — Ambulatory Visit (INDEPENDENT_AMBULATORY_CARE_PROVIDER_SITE_OTHER): Payer: Medicare Other | Admitting: Urology

## 2022-07-04 VITALS — BP 121/68 | HR 69

## 2022-07-04 DIAGNOSIS — Z8551 Personal history of malignant neoplasm of bladder: Secondary | ICD-10-CM

## 2022-07-04 DIAGNOSIS — R339 Retention of urine, unspecified: Secondary | ICD-10-CM

## 2022-07-04 DIAGNOSIS — R350 Frequency of micturition: Secondary | ICD-10-CM

## 2022-07-04 DIAGNOSIS — C679 Malignant neoplasm of bladder, unspecified: Secondary | ICD-10-CM

## 2022-07-04 LAB — BLADDER SCAN AMB NON-IMAGING

## 2022-07-04 NOTE — Progress Notes (Signed)
I, Maysun L Gibbs,acting as a scribe for [name], MD.,have documented all relevant documentation on the behalf of [name], MD,as directed by  [name], MD while in the presence of [name], MD.  07/04/2022 12:14 PM   [name] 1945/12/27 [id number]  Referring provider: [name], MD [address] [place],  [place] [address]  Chief Complaint  Patient presents with   Follow-up    Discuss pathology report   Urologic history: 1.  Recurrent Ta urothelial carcinoma bladder (available record review) TURBT July 2011; low-grade Ta TURBT December 2014; low-grade Ta; postresection mitomycin TURBT 07/2015; low-grade Ta; intravesical gemcitabine x6 weeks TURBT 10/2020;Ta low-grade urothelial carcinoma with small focus suggestive of high-grade features; repeat intravesical gemcitabine 6 completed 01/07/2021 Bx w/ fulguration 07/17/2021; Ta low grade    2.  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 male here for post-op follow-up.  Status post cystoscopy under anesthesia 06/24/2022 with findings of diffuse hemorrhagic areas posterior bladder wall. Multiple biopsies were taken showing no evidence of recurrent urothelial carcinoma. Pathology showed extensively ulcerated urethral mucosa with inflammation and reactive atypia. Has several month history of urinary retention and urinary incontinence. After his last procedure, his urinary incontinence has markedly improved. Currently only taking Silodosin PVR today 157 mL He is gradually increasing his activity. Has lost 24 pounds in less than 1 month, and has recently gained 3 pounds. Overall, he's feeling much better.    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 4v CABG on 04/13/2002. b.) LHC 02/03/2013 --> LVEF 50%; severe 3v CAD with patent LIMA-LAD and SVG-OM1 grafts; SVG-PDA and SVG-DG1 occluded with collaterals to PDA and DG1 from LAD.   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   a.) LIMA-LAD, SVG-OM1, SVG-D1, SVG-PDA   Tubular adenoma of colon 02/03/2007    Surgical History: Past Surgical History:  Procedure Laterality Date   CARDIAC CATHETERIZATION Left 04/13/2002   Procedure: CARDIAC CATHETERIZATION; Location: [facility]; Surgeon; Marcina Millard, MD   CARDIAC CATHETERIZATION Left 02/03/2013   Procedure: CARDIAC CATHETERIZATION; Location: [facility]; Surgeon: Marcina Millard, MD   COLONOSCOPY W/ POLYPECTOMY     COLONOSCOPY WITH PROPOFOL N/A 04/09/2015   Procedure: COLONOSCOPY WITH PROPOFOL;  Surgeon: Wallace Cullens, MD;  Location: [facility] ENDOSCOPY;  Service: Gastroenterology;  Laterality: N/A;   CORONARY ANGIOPLASTY     CORONARY ARTERY BYPASS GRAFT N/A 04/13/2002   Procedure: 4v CABG (LIMA-LAD, SVG-OM1, SVG-D1, SVG-PDA); Location: [facility]; Surgeon: Rana Snare, MD   CYSTOSCOPY N/A 07/17/2021   Procedure: Bluford Kaufmann;  Surgeon: [name], MD;  Location: [facility]  ORS;  Service: Urology;  Laterality: N/A;   CYSTOSCOPY N/A 06/24/2022   Procedure: DIAGNOSTIC CYSTOSCOPY;  Surgeon: [name], MD;  Location: [facility] ORS;  Service: Urology;  Laterality: N/A;   CYSTOSCOPY WITH BIOPSY N/A 07/17/2021   Procedure: CYSTOSCOPY WITH BIOPSY;  Surgeon: [name], MD;   Location: [facility] ORS;  Service: Urology;  Laterality: N/A;   CYSTOSCOPY WITH BIOPSY N/A 06/24/2022   Procedure: CYSTOSCOPY WITH BLADDER BIOPSY;  Surgeon: [name], MD;  Location: [facility] ORS;  Service: Urology;  Laterality: N/A;   CYSTOSCOPY WITH FULGERATION N/A 07/17/2021   Procedure: CYSTOSCOPY WITH FULGERATION;  Surgeon: [name], MD;  Location: [facility] ORS;  Service: Urology;  Laterality: N/A;   CYSTOSCOPY WITH FULGERATION N/A 06/24/2022   Procedure: CYSTOSCOPY WITH FULGERATION;  Surgeon: [name], MD;  Location: [facility] ORS;  Service: Urology;  Laterality: N/A;   EYE SURGERY     both eyes with cataract with lens 2021   TONSILLECTOMY     TRANSURETHRAL RESECTION OF BLADDER TUMOR N/A 11/13/2020   Procedure: TRANSURETHRAL RESECTION OF BLADDER TUMOR (TURBT);  Surgeon: [name], MD;  Location: [facility] ORS;  Service: Urology;  Laterality: N/A;   TRANSURETHRAL RESECTION OF BLADDER TUMOR N/A 06/24/2022   Procedure: TRANSURETHRAL RESECTION OF BLADDER TUMOR (TURBT);  Surgeon: [name], MD;  Location: [facility] ORS;  Service: Urology;  Laterality: N/A;   TRANSURETHRAL RESECTION OF BLADDER TUMOR WITH GYRUS (TURBT-GYRUS)  06/24/2022   TRANSURETHRAL RESECTION OF PROSTATE      Home Medications:  Allergies as of 07/04/2022       Reactions   Cidex [glutaral] Anaphylaxis   Other Anaphylaxis   - Ortho-phthalaldehyde  - trospion cause AMS        Medication List        Accurate as of Jul 04, 2022 12:14 PM. If you have any questions, ask your nurse or doctor.          acetaminophen 500 MG tablet Commonly known as: TYLENOL Take 500 mg by mouth every 6 (six) hours as needed.   aspirin 81 MG tablet Take 81 mg by mouth daily.   atorvastatin 10 MG tablet Commonly known as: LIPITOR Take 10 mg by mouth daily.   cetirizine 10 MG tablet Commonly known as: ZYRTEC Take 10 mg by mouth at bedtime.   Coenzyme Q10 100 MG capsule Take 100 mg by mouth daily.    cyanocobalamin 1000 MCG tablet Commonly known as: VITAMIN B12 Take 1,000 mcg by mouth daily.   ferrous sulfate 324 MG Tbec Take 324 mg by mouth.   Fish Oil 1000 MG Caps Take 1,000 mg by mouth daily.   fluticasone 50 MCG/ACT nasal spray Commonly known as: FLONASE Place 1 spray into both nostrils as needed.   GLUCOSAMINE-CHONDROITIN PO Take 1 tablet by mouth 2 (two) times daily.   HYDROcodone-acetaminophen 5-325 MG tablet Commonly known as: NORCO/VICODIN Take 1 tablet by mouth every 6 (six) hours as needed for moderate pain.   isosorbide mononitrate 30 MG 24 hr tablet Commonly known as: IMDUR Take 30 mg by mouth daily.   Magnesium 200 MG Tabs Take 200 mg by mouth 2 (two) times daily.   Mega Probiotic Caps Take 1 capsule by mouth daily.   metoprolol tartrate 25 MG tablet Commonly known as: LOPRESSOR Take 25 mg by mouth 2 (two) times daily.   metroNIDAZOLE 0.75 % gel Commonly known as: METROGEL Apply 1 application topically 2 (two) times daily.   multivitamin tablet  Take 1 tablet by mouth daily.   nitroGLYCERIN 0.4 MG SL tablet Commonly known as: NITROSTAT Place under the tongue.   Potassium Gluconate 595 MG Caps Take 1 capsule by mouth daily as needed (After HT TR exercise).   silodosin 8 MG Caps capsule Commonly known as: RAPAFLO Take 1 capsule (8 mg total) by mouth daily with breakfast.   Vitamin A 2400 MCG (8000 UT) Caps Take 8,000 Units by mouth daily.   vitamin C 250 MG tablet Commonly known as: ASCORBIC ACID Take 250 mg by mouth 2 (two) times daily.   zinc gluconate 50 MG tablet Take 50 mg by mouth daily.        Allergies:  Allergies  Allergen Reactions   Cidex [Glutaral] Anaphylaxis   Other Anaphylaxis    - Ortho-phthalaldehyde  - trospion cause AMS    Family History: Family History  Problem Relation Age of Onset   Prostate cancer Father        Metastatic   Cancer Father     Social History:  reports that he quit smoking about  16 years ago. His smoking use included cigarettes. He has a 45.00 pack-year smoking history. He has been exposed to tobacco smoke. He has never used smokeless tobacco. He reports current alcohol use of about 2.0 standard drinks of alcohol per week. He reports that he does not use drugs.   Physical Exam: BP 121/68   Pulse 69   Constitutional:  Alert and oriented, No acute distress. HEENT: [place] AT Respiratory: Normal respiratory effort, no increased work of breathing. Psychiatric: Normal mood and affect.   Assessment & Plan:    1. 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   2. Incomplete bladder emptying PVR improved at 157 mL Continued Tamsulosin Follow-up 6 months with urinalysis and PVR.  I spent 30 total minutes on the day of the encounter including pre-visit review of the medical record, face-to-face time with the patient, and post visit ordering of labs/imaging/tests.  I have reviewed the above documentation for accuracy and completeness, and I agree with the above.   [name], MD  [facility] [address], [address] [place], [place] [address] [phone]

## 2022-07-05 ENCOUNTER — Encounter: Payer: Self-pay | Admitting: Urology

## 2022-07-06 ENCOUNTER — Encounter: Payer: Self-pay | Admitting: Urology

## 2022-07-25 ENCOUNTER — Telehealth: Payer: Self-pay

## 2022-07-25 ENCOUNTER — Encounter: Payer: Self-pay | Admitting: Urology

## 2022-07-25 ENCOUNTER — Encounter: Payer: Self-pay | Admitting: Emergency Medicine

## 2022-07-25 ENCOUNTER — Emergency Department
Admission: EM | Admit: 2022-07-25 | Discharge: 2022-07-25 | Disposition: A | Discharge status: Home / Self Care | Payer: Medicare Other | Attending: Emergency Medicine | Admitting: Emergency Medicine

## 2022-07-25 DIAGNOSIS — N390 Urinary tract infection, site not specified: Secondary | ICD-10-CM | POA: Insufficient documentation

## 2022-07-25 DIAGNOSIS — R319 Hematuria, unspecified: Secondary | ICD-10-CM | POA: Diagnosis present

## 2022-07-25 LAB — CBC WITH DIFFERENTIAL/PLATELET
Abs Immature Granulocytes: 0.13 10*3/uL — ABNORMAL HIGH (ref 0.00–0.07)
Basophils Absolute: 0.1 10*3/uL (ref 0.0–0.1)
Basophils Relative: 1 %
Eosinophils Absolute: 0.2 10*3/uL (ref 0.0–0.5)
Eosinophils Relative: 1 %
HCT: 34.1 % — ABNORMAL LOW (ref 39.0–52.0)
Hemoglobin: 10.9 g/dL — ABNORMAL LOW (ref 13.0–17.0)
Immature Granulocytes: 1 %
Lymphocytes Relative: 7 %
Lymphs Abs: 1 10*3/uL (ref 0.7–4.0)
MCH: 32.5 pg (ref 26.0–34.0)
MCHC: 32 g/dL (ref 30.0–36.0)
MCV: 101.8 fL — ABNORMAL HIGH (ref 80.0–100.0)
Monocytes Absolute: 1.1 10*3/uL — ABNORMAL HIGH (ref 0.1–1.0)
Monocytes Relative: 7 %
Neutro Abs: 12.7 10*3/uL — ABNORMAL HIGH (ref 1.7–7.7)
Neutrophils Relative %: 83 %
Platelets: 251 10*3/uL (ref 150–400)
RBC: 3.35 MIL/uL — ABNORMAL LOW (ref 4.22–5.81)
RDW: 13.6 % (ref 11.5–15.5)
WBC: 15.2 10*3/uL — ABNORMAL HIGH (ref 4.0–10.5)
nRBC: 0 % (ref 0.0–0.2)

## 2022-07-25 LAB — COMPREHENSIVE METABOLIC PANEL
ALT: 14 U/L (ref 0–44)
AST: 23 U/L (ref 15–41)
Albumin: 3.5 g/dL (ref 3.5–5.0)
Alkaline Phosphatase: 63 U/L (ref 38–126)
Anion gap: 6 (ref 5–15)
BUN: 26 mg/dL — ABNORMAL HIGH (ref 8–23)
CO2: 20 mmol/L — ABNORMAL LOW (ref 22–32)
Calcium: 9.2 mg/dL (ref 8.9–10.3)
Chloride: 113 mmol/L — ABNORMAL HIGH (ref 98–111)
Creatinine, Ser: 2.06 mg/dL — ABNORMAL HIGH (ref 0.61–1.24)
GFR, Estimated: 32 mL/min — ABNORMAL LOW (ref >60)
Glucose, Bld: 125 mg/dL — ABNORMAL HIGH (ref 70–99)
Potassium: 4.5 mmol/L (ref 3.5–5.1)
Sodium: 139 mmol/L (ref 135–145)
Total Bilirubin: 0.7 mg/dL (ref 0.3–1.2)
Total Protein: 8.2 g/dL — ABNORMAL HIGH (ref 6.5–8.1)

## 2022-07-25 LAB — URINALYSIS, ROUTINE W REFLEX MICROSCOPIC
RBC / HPF: >50 RBC/hpf (ref 0–5)
Specific Gravity, Urine: 1.028 (ref 1.005–1.030)
Squamous Epithelial / HPF: NONE SEEN /HPF (ref 0–5)
WBC, UA: >50 WBC/hpf (ref 0–5)

## 2022-07-25 MED ORDER — CEFUROXIME AXETIL 500 MG PO TABS: 500.0000 mg | ORAL_TABLET | Freq: Two times a day (BID) | ORAL | 0 refills | Status: AC

## 2022-07-25 MED ORDER — CEFDINIR 300 MG PO CAPS
300.0000 mg | ORAL_CAPSULE | Freq: Once | ORAL | Status: AC
  Administered 2022-07-25: 300 mg via ORAL
  Filled 2022-07-25: qty 1

## 2022-07-25 NOTE — Discharge Instructions (Signed)
Call Dr. Lonna Cobb for an appointment early next week.  As we discussed if you have any new, worsening, or unexpected symptoms including but not limited to severe pain, signs or symptoms of a complete blockage like we discussed, or any other new or worsening symptoms call your doctor or return to the emergency department for evaluation.

## 2022-07-25 NOTE — ED Notes (Signed)
Patient voided in toilet and had a bowel movement. Patient states that he passed a clot while voiding. Bladder scan revealed 0mL in bladder. Pt staes that he feels pressure at this time. MD Modesto Charon updated

## 2022-07-25 NOTE — Telephone Encounter (Signed)
If he is responding to the antibiotic therapy would do a 2-week follow-up with UA and PVR with myself or PA visit  The ED did not order a urine culture and if his symptoms are not improving by Monday would have him do a urine drop-off for urine culture

## 2022-07-25 NOTE — ED Triage Notes (Signed)
Pt presents ambulatory to triage via POV with complaints of hematuria that started this AM with associated bladder pain. Hx of frequent UTIs - pt notes having some relief after voiding - decreased from 9/10 to 5/10. A&Ox4 at this time. Denies dizziness, chills, fevers, CP or SOB.

## 2022-07-25 NOTE — ED Provider Notes (Addendum)
[facility] Provider Note    Event Date/Time   First MD Initiated Contact with Patient 07/25/22 0703     (approximate)   History   Hematuria   HPI  [name] is a 77 y.o. male   Past medical history of bladder cancer, history of recurrent UTIs who presents to the emergency department with blood clots, frequency and dysuria starting last night.  Reminiscent of UTIs in the past.  Recent urologic procedure and has been expecting intermittent blood clots.  No systemic symptoms like fevers or chills.  He is able to pass urine and occasional blood clots and most recently he urinated this morning and was able to provide urine sample.  He has no suprapubic pain or sensation of distention  Independent Historian contributed to assessment above: Wife is at bedside corroborates information given above  External Medical Documents Reviewed: Dr. Lonna Cobb note from 07/04/2022 documenting history of urothelial carcinoma bladder and recent treatments, cystoscopy from early May with diffuse hemorrhagic bladder wall no evidence of recurrent urothelial carcinoma      Physical Exam   Triage Vital Signs: ED Triage Vitals  Enc Vitals Group     BP 07/25/22 0617 (!) 156/74     Pulse Rate 07/25/22 0617 78     Resp 07/25/22 0617 18     Temp 07/25/22 0617 (!) 97.5 F (36.4 C)     Temp Source 07/25/22 0617 Oral     SpO2 07/25/22 0617 100 %     Weight 07/25/22 0616 172 lb (78 kg)     Height 07/25/22 0616 5\' 6"  (1.676 m)     Head Circumference --      Peak Flow --      Pain Score 07/25/22 0616 5     Pain Loc --      Pain Edu? --      Excl. in GC? --     Most recent vital signs: Vitals:   07/25/22 0617 07/25/22 0650  BP: (!) 156/74 137/82  Pulse: 78   Resp: 18 13  Temp: (!) 97.5 F (36.4 C)   SpO2: 100%     General: Awake, no distress.  CV:  Good peripheral perfusion.  Resp:  Normal effort.  Abd:  No distention.  Other:  Soft nontender abdomen and is  nontoxic well-appearing patient with normal vital signs and afebrile   ED Results / Procedures / Treatments   Labs (all labs ordered are listed, but only abnormal results are displayed) Labs Reviewed  COMPREHENSIVE METABOLIC PANEL - Abnormal; Notable for the following components:      Result Value   Chloride 113 (*)    CO2 20 (*)    Glucose, Bld 125 (*)    BUN 26 (*)    Creatinine, Ser 2.06 (*)    Total Protein 8.2 (*)    GFR, Estimated 32 (*)    All other components within normal limits  URINALYSIS, ROUTINE W REFLEX MICROSCOPIC - Abnormal; Notable for the following components:   Color, Urine RED (*)    APPearance CLOUDY (*)    Glucose, UA   (*)    Value: TEST NOT REPORTED DUE TO COLOR INTERFERENCE OF URINE PIGMENT   Hgb urine dipstick   (*)    Value: TEST NOT REPORTED DUE TO COLOR INTERFERENCE OF URINE PIGMENT   Bilirubin Urine   (*)    Value: TEST NOT REPORTED DUE TO COLOR INTERFERENCE OF URINE PIGMENT   Ketones, ur   (*)  Value: TEST NOT REPORTED DUE TO COLOR INTERFERENCE OF URINE PIGMENT   Protein, ur   (*)    Value: TEST NOT REPORTED DUE TO COLOR INTERFERENCE OF URINE PIGMENT   Nitrite   (*)    Value: TEST NOT REPORTED DUE TO COLOR INTERFERENCE OF URINE PIGMENT   Leukocytes,Ua   (*)    Value: TEST NOT REPORTED DUE TO COLOR INTERFERENCE OF URINE PIGMENT   Bacteria, UA RARE (*)    All other components within normal limits  CBC WITH DIFFERENTIAL/PLATELET  CBC WITH DIFFERENTIAL/PLATELET     I ordered and reviewed the above labs they are notable for bacteria and inflammatory cells in the urine, creatinine is 2.06 from baseline  EKG  ED ECG REPORT I, Pilar Jarvis, the attending physician, personally viewed and interpreted this ECG.   Date: 07/25/2022  EKG Time: 0649  Rate: 77  Rhythm: sinus  Intervals:rbbb  ST&T Change: no stemi    PROCEDURES:  Critical Care performed: No  Procedures   MEDICATIONS ORDERED IN ED: Medications  cefdinir (OMNICEF) capsule  300 mg (has no administration in time range)   IMPRESSION / MDM / ASSESSMENT AND PLAN / ED COURSE  I reviewed the triage vital signs and the nursing notes.                                Patient's presentation is most consistent with acute presentation with potential threat to life or bodily function.  Differential diagnosis includes, but is not limited to, urinary tract infection, acute urinary retention, malignancy, renal failure, AKI, sepsis  The patient is on the cardiac monitor to evaluate for evidence of arrhythmia and/or significant heart rate changes.  MDM:   Patient with recurrent UTIs and bacterial inflammatory changes consistent with urinary tract infection will treat with cefdinir as previously treated based on recommendation by his urologist from previous UTI and sensitivities from most recent culture.  Passing blood clots expected after urologic procedure will check for evidence of retention with postvoid residual and Place Foley catheter if there is evidence of retention.  Otherwise nontoxic-appearing doubt sepsis doubt significant blood loss and so I think close follow-up with urology as an outpatient is appropriate at this time rather than admission.  -- PVR 0.  No signs of retention at this moment so I will defer catheterization and instead treat his urinary tract infection which I suspect is due to his dysuria, urinalysis, and white blood cell count elevation.  Considered admission patient is otherwise well-appearing without signs of sepsis and I think the patient will benefit from outpatient prescription to follow-up with urologist,       FINAL CLINICAL IMPRESSION(S) / ED DIAGNOSES   Final diagnoses:  Hematuria, unspecified type  Lower urinary tract infectious disease     Rx / DC Orders   ED Discharge Orders          Ordered    cefUROXime (CEFTIN) 500 MG tablet  2 times daily with meals        07/25/22 0728             Note:  This document was  prepared using Dragon voice recognition software and may include unintentional dictation errors.    Pilar Jarvis, MD 07/25/22 4098    Pilar Jarvis, MD 07/25/22 1191    Pilar Jarvis, MD 07/25/22 [phone]

## 2022-07-25 NOTE — Telephone Encounter (Signed)
Patient advised. Patient wanted to see Dr Lonna Cobb and appointment was scheduled for 08/11/22

## 2022-07-25 NOTE — Telephone Encounter (Signed)
Patient left message on triage line stating that he was at ER today and was prescribed antibiotics and was told to follow up with Dr Lonna Cobb but patient was not sure how long to wait before following up. I did not see a time frame in the ER Notes. Please let patient know

## 2022-08-11 ENCOUNTER — Ambulatory Visit (INDEPENDENT_AMBULATORY_CARE_PROVIDER_SITE_OTHER): Payer: Medicare Other | Admitting: Urology

## 2022-08-11 ENCOUNTER — Encounter: Payer: Self-pay | Admitting: Urology

## 2022-08-11 VITALS — BP 126/64 | HR 69 | Ht 66.0 in | Wt 172.0 lb

## 2022-08-11 DIAGNOSIS — Z8551 Personal history of malignant neoplasm of bladder: Secondary | ICD-10-CM

## 2022-08-11 DIAGNOSIS — Z87440 Personal history of urinary (tract) infections: Secondary | ICD-10-CM | POA: Diagnosis not present

## 2022-08-11 DIAGNOSIS — R339 Retention of urine, unspecified: Secondary | ICD-10-CM | POA: Diagnosis not present

## 2022-08-11 DIAGNOSIS — R310 Gross hematuria: Secondary | ICD-10-CM

## 2022-08-11 LAB — URINALYSIS, COMPLETE
Bilirubin, UA: NEGATIVE
Glucose, UA: NEGATIVE
Ketones, UA: NEGATIVE
Nitrite, UA: NEGATIVE
Specific Gravity, UA: 1.01 (ref 1.005–1.030)
Urobilinogen, Ur: 0.2 mg/dL (ref 0.2–1.0)
pH, UA: 5.5 (ref 5.0–7.5)

## 2022-08-11 LAB — BLADDER SCAN AMB NON-IMAGING: Scan Result: 127

## 2022-08-11 LAB — MICROSCOPIC EXAMINATION: WBC, UA: >30 /hpf — AB (ref 0–5)

## 2022-08-11 NOTE — Progress Notes (Signed)
I, [name],acting as a scribe for Jake Altes, MD.,have documented all relevant documentation on the behalf of Jake Altes, MD,as directed by  Jake Altes, MD while in the presence of Jake Altes, MD.  08/11/2022 2:03 PM   [name] 1945-11-07 [id number]  Referring provider: [name], MD [address] [place],  [place] [address]  Chief Complaint  Patient presents with   Follow-up   Urologic history: 1.  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 male here for follow up visit,  After his visit 07/04/2022 he had an ED visit for gross hematuria. PVR was 0 mL and his hematuria significantly improved.  He continues to improve from a voiding standpoint with daytime voiding less frequency but higher volumes per void.  Significant decrease in his daytime urinary incontinence. He is able to hold more volume at night, however, does have episodes of enuresis.  No significant gross hematuria since his last ED visit.   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 4v CABG on 04/13/2002. b.) LHC 02/03/2013 --> LVEF 50%; severe 3v CAD with patent LIMA-LAD and SVG-OM1 grafts; SVG-PDA and SVG-DG1 occluded with collaterals to PDA and DG1 from LAD.   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   a.) LIMA-LAD, SVG-OM1, SVG-D1, SVG-PDA   Tubular adenoma of colon 02/03/2007    Surgical History: Past Surgical History:  Procedure Laterality Date   CARDIAC CATHETERIZATION Left 04/13/2002   Procedure: CARDIAC CATHETERIZATION; Location: [facility]; Surgeon; Jake Millard, MD   CARDIAC CATHETERIZATION Left 02/03/2013   Procedure: CARDIAC CATHETERIZATION; Location: [facility]; Surgeon: Jake Millard, MD   COLONOSCOPY W/ POLYPECTOMY     COLONOSCOPY WITH PROPOFOL N/A 04/09/2015   Procedure: COLONOSCOPY WITH PROPOFOL;  Surgeon: Jake Cullens, MD;  Location: [facility] ENDOSCOPY;  Service: Gastroenterology;  Laterality: N/A;   CORONARY ANGIOPLASTY     CORONARY ARTERY BYPASS GRAFT N/A 04/13/2002   Procedure: 4v CABG (LIMA-LAD, SVG-OM1, SVG-D1, SVG-PDA); Location: [facility]; Surgeon: Jake Snare, MD   CYSTOSCOPY N/A 07/17/2021   Procedure: [name];  Surgeon: Jake Altes, MD;  Location: [facility] ORS;  Service: Urology;  Laterality: N/A;   CYSTOSCOPY N/A 06/24/2022   Procedure: DIAGNOSTIC CYSTOSCOPY;  Surgeon: Jake Altes, MD;  Location: [facility]  ORS;  Service: Urology;  Laterality: N/A;   CYSTOSCOPY WITH BIOPSY N/A 07/17/2021   Procedure: CYSTOSCOPY WITH BIOPSY;  Surgeon: Jake Altes, MD;  Location: [facility] ORS;  Service: Urology;  Laterality: N/A;   CYSTOSCOPY WITH BIOPSY N/A 06/24/2022   Procedure: CYSTOSCOPY WITH BLADDER BIOPSY;  Surgeon: Jake Altes, MD;  Location: [facility] ORS;  Service: Urology;  Laterality: N/A;   CYSTOSCOPY WITH FULGERATION N/A  07/17/2021   Procedure: CYSTOSCOPY WITH FULGERATION;  Surgeon: Jake Altes, MD;  Location: [facility] ORS;  Service: Urology;  Laterality: N/A;   CYSTOSCOPY WITH FULGERATION N/A 06/24/2022   Procedure: CYSTOSCOPY WITH FULGERATION;  Surgeon: Jake Altes, MD;  Location: [facility] ORS;  Service: Urology;  Laterality: N/A;   EYE SURGERY     both eyes with cataract with lens 2021   TONSILLECTOMY     TRANSURETHRAL RESECTION OF BLADDER TUMOR N/A 11/13/2020   Procedure: TRANSURETHRAL RESECTION OF BLADDER TUMOR (TURBT);  Surgeon: Jake Altes, MD;  Location: [facility] ORS;  Service: Urology;  Laterality: N/A;   TRANSURETHRAL RESECTION OF BLADDER TUMOR N/A 06/24/2022   Procedure: TRANSURETHRAL RESECTION OF BLADDER TUMOR (TURBT);  Surgeon: Jake Altes, MD;  Location: [facility] ORS;  Service: Urology;  Laterality: N/A;   TRANSURETHRAL RESECTION OF BLADDER TUMOR WITH GYRUS (TURBT-GYRUS)  06/24/2022   TRANSURETHRAL RESECTION OF PROSTATE      Home Medications:  Allergies as of 08/11/2022       Reactions   Cidex [glutaral] Anaphylaxis   Other Anaphylaxis   - Ortho-phthalaldehyde  - trospion cause AMS        Medication List        Accurate as of August 11, 2022  2:03 PM. If you have any questions, ask your Johns or doctor.          acetaminophen 500 MG tablet Commonly known as: TYLENOL Take 500 mg by mouth every 6 (six) hours as needed.   aspirin 81 MG tablet Take 81 mg by mouth daily.   atorvastatin 10 MG tablet Commonly known as: LIPITOR Take 10 mg by mouth daily.   cetirizine 10 MG tablet Commonly known as: ZYRTEC Take 10 mg by mouth at bedtime.   Coenzyme Q10 100 MG capsule Take 100 mg by mouth daily.   cyanocobalamin 1000 MCG tablet Commonly known as: VITAMIN B12 Take 1,000 mcg by mouth daily.   ferrous sulfate 324 MG Tbec Take 324 mg by mouth.   Fish Oil 1000 MG Caps Take 1,000 mg by mouth daily.   fluticasone 50 MCG/ACT nasal spray Commonly known as: FLONASE Place 1  spray into both nostrils as needed.   GLUCOSAMINE-CHONDROITIN PO Take 1 tablet by mouth 2 (two) times daily.   HYDROcodone-acetaminophen 5-325 MG tablet Commonly known as: NORCO/VICODIN Take 1 tablet by mouth every 6 (six) hours as needed for moderate pain.   isosorbide mononitrate 30 MG 24 hr tablet Commonly known as: IMDUR Take 30 mg by mouth daily.   Magnesium 200 MG Tabs Take 200 mg by mouth 2 (two) times daily.   Mega Probiotic Caps Take 1 capsule by mouth daily.   metoprolol tartrate 25 MG tablet Commonly known as: LOPRESSOR Take 25 mg by mouth 2 (two) times daily.   metroNIDAZOLE 0.75 % gel Commonly known as: METROGEL Apply 1 application topically 2 (two) times daily.   multivitamin tablet Take 1 tablet by mouth daily.   nitroGLYCERIN 0.4 MG SL tablet Commonly known as: NITROSTAT Place under the tongue.   Potassium Gluconate  595 MG Caps Take 1 capsule by mouth daily as needed (After HT TR exercise).   silodosin 8 MG Caps capsule Commonly known as: RAPAFLO Take 1 capsule (8 mg total) by mouth daily with breakfast.   Vitamin A 2400 MCG (8000 UT) Caps Take 8,000 Units by mouth daily.   vitamin C 250 MG tablet Commonly known as: ASCORBIC ACID Take 250 mg by mouth 2 (two) times daily.   zinc gluconate 50 MG tablet Take 50 mg by mouth daily.        Allergies:  Allergies  Allergen Reactions   Cidex [Glutaral] Anaphylaxis   Other Anaphylaxis    - Ortho-phthalaldehyde  - trospion cause AMS    Family History: Family History  Problem Relation Age of Onset   Prostate cancer Father        Metastatic   Cancer Father     Social History:  reports that he quit smoking about 16 years ago. His smoking use included cigarettes. He has a 45.00 pack-year smoking history. He has been exposed to tobacco smoke. He has never used smokeless tobacco. He reports current alcohol use of about 2.0 standard drinks of alcohol per week. He reports that he does not use  drugs.   Physical Exam: BP 126/64   Pulse 69   Ht 5\' 6"  (1.676 m)   Wt 172 lb (78 kg)   BMI 27.76 kg/m   Constitutional:  Alert and oriented, No acute distress. HEENT: [place] AT, moist mucus membranes.  Trachea midline, no masses. Cardiovascular: No clubbing, cyanosis, or edema. Respiratory: Normal respiratory effort, no increased work of breathing. GI: Abdomen is soft, nontender, nondistended, no abdominal masses Skin: No rashes, bruises or suspicious lesions. Neurologic: Grossly intact, no focal deficits, moving all 4 extremities. Psychiatric: Normal mood and affect.   Urinalysis 3+ blood/3+ leukocyte/2+ protein. Microscopy >30 WBC/11-30 RBCs   Assessment & Plan:    1. Incomplete bladder emptying PVR today 127 mL  UA with pyuria however, he is asymptomatic, and this most likely related to his incomplete bladder emptying.  Urine culture ordered in the event he develops symptoms for appropriate antibiotic therapy.   2. History of urothelial carcinoma bladder Cystoscopy scheduled November 2024.  [facility] [address] [place], [place] [address] [phone]

## 2022-08-12 ENCOUNTER — Encounter: Payer: Self-pay | Admitting: Urology

## 2022-08-13 LAB — CULTURE, URINE COMPREHENSIVE

## 2022-08-14 LAB — CULTURE, URINE COMPREHENSIVE

## 2022-08-15 ENCOUNTER — Encounter: Payer: Self-pay | Admitting: Urology

## 2022-08-28 ENCOUNTER — Encounter: Payer: Self-pay | Admitting: Urology

## 2022-09-01 ENCOUNTER — Encounter: Payer: Self-pay | Admitting: Urology

## 2022-09-03 ENCOUNTER — Ambulatory Visit: Payer: Medicare Other | Admitting: Urology

## 2022-09-19 ENCOUNTER — Encounter: Payer: Self-pay | Admitting: Urology

## 2022-09-19 ENCOUNTER — Other Ambulatory Visit: Payer: Self-pay | Admitting: Physician Assistant

## 2022-09-19 DIAGNOSIS — R310 Gross hematuria: Secondary | ICD-10-CM

## 2022-09-19 MED ORDER — CEFUROXIME AXETIL 250 MG PO TABS
250.0000 mg | ORAL_TABLET | Freq: Two times a day (BID) | ORAL | 0 refills | Status: AC
Start: 2022-09-19 — End: 2022-09-26

## 2022-10-01 ENCOUNTER — Encounter: Payer: Self-pay | Admitting: Urology

## 2022-10-01 ENCOUNTER — Ambulatory Visit (INDEPENDENT_AMBULATORY_CARE_PROVIDER_SITE_OTHER): Payer: Medicare Other | Admitting: Urology

## 2022-10-01 VITALS — BP 128/80 | HR 74 | Ht 67.0 in | Wt 172.0 lb

## 2022-10-01 DIAGNOSIS — Z87440 Personal history of urinary (tract) infections: Secondary | ICD-10-CM | POA: Diagnosis not present

## 2022-10-01 DIAGNOSIS — Z8551 Personal history of malignant neoplasm of bladder: Secondary | ICD-10-CM

## 2022-10-01 DIAGNOSIS — R310 Gross hematuria: Secondary | ICD-10-CM

## 2022-10-01 DIAGNOSIS — N3040 Irradiation cystitis without hematuria: Secondary | ICD-10-CM

## 2022-10-01 DIAGNOSIS — R339 Retention of urine, unspecified: Secondary | ICD-10-CM

## 2022-10-01 DIAGNOSIS — R8281 Pyuria: Secondary | ICD-10-CM | POA: Diagnosis not present

## 2022-10-01 LAB — BLADDER SCAN AMB NON-IMAGING: Scan Result: 107

## 2022-10-01 LAB — URINALYSIS, COMPLETE
Bilirubin, UA: NEGATIVE
Glucose, UA: NEGATIVE
Ketones, UA: NEGATIVE
Nitrite, UA: POSITIVE — AB
Specific Gravity, UA: 1.01 (ref 1.005–1.030)
Urobilinogen, Ur: 0.2 mg/dL (ref 0.2–1.0)
pH, UA: 6.5 (ref 5.0–7.5)

## 2022-10-01 LAB — MICROSCOPIC EXAMINATION: WBC, UA: >30 /hpf — AB (ref 0–5)

## 2022-10-01 NOTE — Progress Notes (Signed)
I, [name], acting as a Neurosurgeon for Jake Altes, MD., have documented all relevant documentation on the behalf of Jake Altes, MD, as directed by  Jake Altes, MD while in the presence of Jake Altes, MD.   10/01/2022 5:16 PM   [name] 1945-07-26 [id number]  Referring provider: [name], MD [address] [place],  [place] [address]  Chief Complaint  Patient presents with   Hematuria   Urologic history: 1.  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 male here for follow up visit.  Last seen 08/11/22 and PVR stable at 127 mL. Urinalysis did show pyuria, though this was not treated as he was asymptomatic. His urine culture did grow E.coli resistant only to Henomycin and Tetracycline. He developed an episode of significant fross hematuria in early August with clots and a difficult urination. Rx Cefuroxime was sent in and he states his hemturia resolved. He was not having dysuria. His only complaint today has been constipation with his previous antibiotics therapy, which is improving.   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 4v CABG on 04/13/2002. b.) LHC 02/03/2013 --> LVEF 50%; severe 3v CAD with patent LIMA-LAD and SVG-OM1 grafts; SVG-PDA and SVG-DG1 occluded with collaterals to PDA and DG1 from LAD.   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   a.) LIMA-LAD, SVG-OM1, SVG-D1, SVG-PDA   Tubular adenoma of colon 02/03/2007    Surgical History: Past Surgical History:  Procedure Laterality Date   CARDIAC CATHETERIZATION Left 04/13/2002   Procedure: CARDIAC CATHETERIZATION; Location: [facility]; Surgeon; Marcina Millard, MD   CARDIAC CATHETERIZATION Left 02/03/2013   Procedure: CARDIAC CATHETERIZATION; Location: [facility]; Surgeon: Marcina Millard, MD   COLONOSCOPY W/ POLYPECTOMY     COLONOSCOPY WITH PROPOFOL N/A 04/09/2015   Procedure: COLONOSCOPY WITH PROPOFOL;  Surgeon: Wallace Cullens, MD;  Location: [facility] ENDOSCOPY;  Service: Gastroenterology;  Laterality: N/A;   CORONARY ANGIOPLASTY     CORONARY ARTERY BYPASS GRAFT N/A 04/13/2002   Procedure: 4v CABG (LIMA-LAD, SVG-OM1, SVG-D1, SVG-PDA); Location: [facility]; Surgeon: Rana Snare, MD   CYSTOSCOPY N/A 07/17/2021   Procedure: Bluford Kaufmann;  Surgeon: Jake Altes, MD;  Location: [facility] ORS;  Service: Urology;  Laterality: N/A;   CYSTOSCOPY N/A 06/24/2022  Procedure: DIAGNOSTIC CYSTOSCOPY;  Surgeon: Jake Altes, MD;  Location: [facility] ORS;  Service: Urology;  Laterality: N/A;   CYSTOSCOPY WITH BIOPSY N/A 07/17/2021   Procedure: CYSTOSCOPY WITH BIOPSY;  Surgeon: Jake Altes, MD;  Location: [facility] ORS;  Service: Urology;  Laterality: N/A;   CYSTOSCOPY WITH BIOPSY N/A 06/24/2022   Procedure: CYSTOSCOPY WITH BLADDER BIOPSY;  Surgeon: Jake Altes, MD;  Location: [facility]  ORS;  Service: Urology;  Laterality: N/A;   CYSTOSCOPY WITH FULGERATION N/A 07/17/2021   Procedure: CYSTOSCOPY WITH FULGERATION;  Surgeon: Jake Altes, MD;  Location: [facility] ORS;  Service: Urology;  Laterality: N/A;   CYSTOSCOPY WITH FULGERATION N/A 06/24/2022   Procedure: CYSTOSCOPY WITH FULGERATION;  Surgeon: Jake Altes, MD;  Location: [facility] ORS;  Service: Urology;  Laterality: N/A;   EYE SURGERY     both eyes with cataract with lens 2021   TONSILLECTOMY     TRANSURETHRAL RESECTION OF BLADDER TUMOR N/A 11/13/2020   Procedure: TRANSURETHRAL RESECTION OF BLADDER TUMOR (TURBT);  Surgeon: Jake Altes, MD;  Location: [facility] ORS;  Service: Urology;  Laterality: N/A;   TRANSURETHRAL RESECTION OF BLADDER TUMOR N/A 06/24/2022   Procedure: TRANSURETHRAL RESECTION OF BLADDER TUMOR (TURBT);  Surgeon: Jake Altes, MD;  Location: [facility] ORS;  Service: Urology;  Laterality: N/A;   TRANSURETHRAL RESECTION OF BLADDER TUMOR WITH GYRUS (TURBT-GYRUS)  06/24/2022   TRANSURETHRAL RESECTION OF PROSTATE      Home Medications:  Allergies as of 10/01/2022       Reactions   Cidex [glutaral] Anaphylaxis   Other Anaphylaxis   - Ortho-phthalaldehyde  - trospion cause AMS        Medication List        Accurate as of October 01, 2022  5:16 PM. If you have any questions, ask your Johns or doctor.          acetaminophen 500 MG tablet Commonly known as: TYLENOL Take 500 mg by mouth every 6 (six) hours as needed.   aspirin 81 MG tablet Take 81 mg by mouth daily.   atorvastatin 10 MG tablet Commonly known as: LIPITOR Take 10 mg by mouth daily.   cetirizine 10 MG tablet Commonly known as: ZYRTEC Take 10 mg by mouth at bedtime.   Coenzyme Q10 100 MG capsule Take 100 mg by mouth daily.   cyanocobalamin 1000 MCG tablet Commonly known as: VITAMIN B12 Take 1,000 mcg by mouth daily.   ferrous sulfate 324 MG Tbec Take 324 mg by mouth.   Fish Oil 1000 MG Caps Take 1,000 mg by mouth  daily.   fluticasone 50 MCG/ACT nasal spray Commonly known as: FLONASE Place 1 spray into both nostrils as needed.   GLUCOSAMINE-CHONDROITIN PO Take 1 tablet by mouth 2 (two) times daily.   HYDROcodone-acetaminophen 5-325 MG tablet Commonly known as: NORCO/VICODIN Take 1 tablet by mouth every 6 (six) hours as needed for moderate pain.   isosorbide mononitrate 30 MG 24 hr tablet Commonly known as: IMDUR Take 30 mg by mouth daily.   Magnesium 200 MG Tabs Take 200 mg by mouth 2 (two) times daily.   Mega Probiotic Caps Take 1 capsule by mouth daily.   metoprolol tartrate 25 MG tablet Commonly known as: LOPRESSOR Take 25 mg by mouth 2 (two) times daily.   metroNIDAZOLE 0.75 % gel Commonly known as: METROGEL Apply 1 application topically 2 (two) times daily.   multivitamin tablet Take 1 tablet by mouth daily.   nitroGLYCERIN 0.4 MG SL tablet  Commonly known as: NITROSTAT Place under the tongue.   Potassium Gluconate 595 MG Caps Take 1 capsule by mouth daily as needed (After HT TR exercise).   silodosin 8 MG Caps capsule Commonly known as: RAPAFLO Take 1 capsule (8 mg total) by mouth daily with breakfast.   Vitamin A 2400 MCG (8000 UT) Caps Take 8,000 Units by mouth daily.   vitamin C 250 MG tablet Commonly known as: ASCORBIC ACID Take 250 mg by mouth 2 (two) times daily.   zinc gluconate 50 MG tablet Take 50 mg by mouth daily.        Allergies:  Allergies  Allergen Reactions   Cidex [Glutaral] Anaphylaxis   Other Anaphylaxis    - Ortho-phthalaldehyde  - trospion cause AMS    Family History: Family History  Problem Relation Age of Onset   Prostate cancer Father        Metastatic   Cancer Father     Social History:  reports that he quit smoking about 16 years ago. His smoking use included cigarettes. He started smoking about 76 years ago. He has a 45 pack-year smoking history. He has been exposed to tobacco smoke. He has never used smokeless tobacco.  He reports current alcohol use of about 2.0 standard drinks of alcohol per week. He reports that he does not use drugs.   Physical Exam: BP 128/80   Pulse 74   Ht 5\' 7"  (1.702 m)   Wt 172 lb (78 kg)   BMI 26.94 kg/m   Constitutional:  Alert, No acute distress. HEENT: [place] AT Respiratory: Normal respiratory effort, no increased work of breathing. Psychiatric: Normal mood and affect.   Urinalysis 3+blood/2+protein/nitrite positive/3+leukocytes and microscopy >30 WBC/11-30 RBC.   Assessment & Plan:    1. Incomplete bladder emptying PVR improving at 107 mL.  2. Asymptomatic bacterial urea UA today with pyuria and microhematuria; urine culture ordered in the event he developed symptoms.  3. History of urothelial carcinoma Cystoscopy scheduled Novemeber 2024.  4. Gross hematuria Recent episode of gross hematuria. 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 Wound clinic referral to evaluate for hyperbaric oxygen therapy  I have reviewed the above documentation for accuracy and completeness, and I agree with the above.   Jake Altes, MD  [facility] [address], [address] [place], [place] [address] [phone]

## 2022-10-02 ENCOUNTER — Telehealth: Payer: Self-pay

## 2022-10-02 ENCOUNTER — Encounter: Payer: Self-pay | Admitting: Urology

## 2022-10-02 NOTE — Telephone Encounter (Signed)
Call the office.  If after hours nurse line will answer

## 2022-10-02 NOTE — Telephone Encounter (Signed)
Pt called triage line, asking what he should do if he starts having blood in his urine again, while waiting for culture results. Pt asked if he should go to the ER or have some antibiotics called in by [place]. Pt was informed that culture results can take 3-4 days to return. Please advise

## 2022-10-03 ENCOUNTER — Encounter: Payer: Self-pay | Admitting: *Deleted

## 2022-10-03 ENCOUNTER — Other Ambulatory Visit: Payer: Self-pay | Admitting: *Deleted

## 2022-10-03 DIAGNOSIS — C61 Malignant neoplasm of prostate: Secondary | ICD-10-CM

## 2022-10-05 LAB — CULTURE, URINE COMPREHENSIVE

## 2022-10-06 ENCOUNTER — Encounter: Payer: Self-pay | Admitting: Urology

## 2022-10-09 ENCOUNTER — Inpatient Hospital Stay: Payer: Medicare Other | Attending: Radiation Oncology

## 2022-10-09 DIAGNOSIS — Z8546 Personal history of malignant neoplasm of prostate: Secondary | ICD-10-CM | POA: Insufficient documentation

## 2022-10-09 DIAGNOSIS — C61 Malignant neoplasm of prostate: Secondary | ICD-10-CM

## 2022-10-09 LAB — PSA: Prostatic Specific Antigen: 0.13 ng/mL (ref 0.00–4.00)

## 2022-10-16 ENCOUNTER — Encounter: Payer: Medicare Other | Attending: Internal Medicine | Admitting: Internal Medicine

## 2022-10-16 ENCOUNTER — Ambulatory Visit
Admission: RE | Admit: 2022-10-16 | Discharge: 2022-10-16 | Disposition: A | Discharge status: Home / Self Care | Payer: Medicare Other | Source: Ambulatory Visit | Attending: Radiation Oncology | Admitting: Radiation Oncology

## 2022-10-16 VITALS — BP 113/61 | HR 81 | Temp 98.9°F | Resp 16 | Ht 67.0 in | Wt 164.4 lb

## 2022-10-16 DIAGNOSIS — Z8551 Personal history of malignant neoplasm of bladder: Secondary | ICD-10-CM | POA: Diagnosis not present

## 2022-10-16 DIAGNOSIS — Z8546 Personal history of malignant neoplasm of prostate: Secondary | ICD-10-CM | POA: Insufficient documentation

## 2022-10-16 DIAGNOSIS — C61 Malignant neoplasm of prostate: Secondary | ICD-10-CM | POA: Diagnosis present

## 2022-10-16 DIAGNOSIS — Z951 Presence of aortocoronary bypass graft: Secondary | ICD-10-CM | POA: Insufficient documentation

## 2022-10-16 DIAGNOSIS — R319 Hematuria, unspecified: Secondary | ICD-10-CM | POA: Insufficient documentation

## 2022-10-16 DIAGNOSIS — Z923 Personal history of irradiation: Secondary | ICD-10-CM | POA: Diagnosis not present

## 2022-10-16 DIAGNOSIS — Z87891 Personal history of nicotine dependence: Secondary | ICD-10-CM | POA: Insufficient documentation

## 2022-10-16 DIAGNOSIS — I10 Essential (primary) hypertension: Secondary | ICD-10-CM | POA: Diagnosis not present

## 2022-10-16 DIAGNOSIS — N3041 Irradiation cystitis with hematuria: Secondary | ICD-10-CM | POA: Insufficient documentation

## 2022-10-16 DIAGNOSIS — I255 Ischemic cardiomyopathy: Secondary | ICD-10-CM | POA: Diagnosis not present

## 2022-10-16 NOTE — Progress Notes (Signed)
Radiation Oncology Follow up Note  Name: [name]   Date:   10/16/2022 MRN:  [id number] DOB: 11-17-1945    This 77 y.o. male presents to the clinic today for 4-year follow-up status post IMRT radiation therapy for Gleason 7 adenocarcinoma presented with a PSA of 17.  REFERRING PROVIDER: [name], MD  HPI: Patient is a 77 year old male now out close to 5 years having completed IMRT radiation therapy for Gleason 7 adenocarcinoma presenting with a PSA of 17.  Patient had multiple problems with bladder cancer multiple TURBTs.  He has been treated with multiple episodes of antibiotic therapy is no longer using a condom catheter.Marland Kitchen  His most recent PSA is stable at 0.13 up slightly from 0.089 months ago.  Patient did have a recent episode of gross hematuria he is also being treated for pyuria and microhematuria on urinalysis of this month and urology.  He has started hyperbaric treatment for continuation of hematuria under Dr. Heywood Footman direction  COMPLICATIONS OF TREATMENT: none  FOLLOW UP COMPLIANCE: keeps appointments   PHYSICAL EXAM:  There were no vitals taken for this visit. Well-developed well-nourished patient in NAD. HEENT reveals PERLA, EOMI, discs not visualized.  Oral cavity is clear. No oral mucosal lesions are identified. Neck is clear without evidence of cervical or supraclavicular adenopathy. Lungs are clear to A&P. Cardiac examination is essentially unremarkable with regular rate and rhythm without murmur rub or thrill. Abdomen is benign with no organomegaly or masses noted. Motor sensory and DTR levels are equal and symmetric in the upper and lower extremities. Cranial nerves II through XII are grossly intact. Proprioception is intact. No peripheral adenopathy or edema is identified. No motor or sensory levels are noted. Crude visual fields are within normal range.  RADIOLOGY RESULTS: No current films for review  PLAN: Present time I am going to turn follow-up care  over to urology.  He has multiple urinary problems including bladder cancer prostate cancer recurrent UTIs.  I be happy to reevaluate patient the future should that be indicated.  His PSA has been stable over time.  Patient knows to call with any questions.  I would like to take this opportunity to thank you for allowing me to participate in the care of your patient.Carmina Miller, MD

## 2022-10-17 ENCOUNTER — Other Ambulatory Visit: Payer: Self-pay | Admitting: Internal Medicine

## 2022-10-17 ENCOUNTER — Ambulatory Visit
Admission: RE | Admit: 2022-10-17 | Discharge: 2022-10-17 | Disposition: A | Discharge status: Home / Self Care | Payer: Medicare Other | Source: Ambulatory Visit | Attending: Internal Medicine | Admitting: Internal Medicine

## 2022-10-17 ENCOUNTER — Ambulatory Visit
Admission: RE | Admit: 2022-10-17 | Discharge: 2022-10-17 | Disposition: A | Discharge status: Home / Self Care | Payer: Medicare Other | Attending: Internal Medicine | Admitting: Internal Medicine

## 2022-10-17 DIAGNOSIS — Z01818 Encounter for other preprocedural examination: Secondary | ICD-10-CM | POA: Insufficient documentation

## 2022-10-17 NOTE — Progress Notes (Signed)
[name] ([id number]) [phone] [id number] Page 1 of 5 Visit Report for 10/16/2022 Abuse Risk Screen Details Patient Name: Date of Service: [name], [name] 10/16/2022 9:30 A M Medical Record Number: [id number] Patient Account Number: [id number] Date of Birth/Sex: Treating RN: [birth year] (77 y.o. [name]) [name] Primary Care [name]: [name] Other Clinician: Referring [name]: Treating [name]/Extender: [name], [name], [name] in Treatment: 0 Abuse Risk Screen Items Answer ABUSE RISK SCREEN: Has anyone close to you tried to hurt or harm you recentlyo No Do you feel uncomfortable with anyone in your familyo No Has anyone forced you do things that you didnt want to doo No Electronic Signature(s) Signed: 10/17/2022 [name] By: Jake Pax RN Entered By: [name] on 10/16/2022 06:53:48 -------------------------------------------------------------------------------- Activities of Daily Living Details Patient Name: Date of Service: [name], [name] 10/16/2022 9:30 A M Medical Record Number: [id number] Patient Account Number: [id number] Date of Birth/Sex: Treating RN: [birth year]Johns (77 y.o. [name]) [name] Primary Care Azalyn Sliwa: [name] Other Clinician: Referring [name]: Treating [name]/Extender: [name], [name], [name] in Treatment: 0 Activities of Daily Living Items Answer Activities of Daily Living (Please select one for each item) Drive Automobile Completely Able T Medications ake Completely Able Use T elephone Completely Able Care for Appearance Completely Able Use T oilet Completely Able Bath / Shower Completely Able Dress Self Completely Able Feed Self Completely Able Walk Completely Able Get In / Out Bed Completely Able Housework Completely [name] ([id number]) [phone] [id number] Page 2 of 5 Prepare Meals Completely Able Handle Money  Completely Able Shop for Self Completely Able Electronic Signature(s) Signed: 10/17/2022 [name] By: Jake Pax RN Entered By: [name] on 10/16/2022 06:54:08 -------------------------------------------------------------------------------- Education Screening Details Patient Name: Date of Service: [name] 10/16/2022 9:30 A M Medical Record Number: [id number] Patient Account Number: [id number] Date of Birth/Sex: Treating RN: [name] (77 y.o. [name]) [name] Primary Care Capricia Serda: [name] Other Clinician: Referring [name]: Treating [name]/Extender: [name], [name], [name] in Treatment: 0 Primary Learner Assessed: Patient Learning Preferences/Education Level/Primary Language Learning Preference: Explanation Highest Education Level: College or Above Preferred Language: English [name] Language Barrier: No Translator Needed: No Memory Deficit: No Emotional Barrier: No Cultural/Religious Beliefs Affecting Medical Care: No Physical Barrier Impaired Vision: No Impaired Hearing: No Decreased Hand dexterity: No Knowledge/Comprehension Knowledge Level: Medium Comprehension Level: High Ability to understand written instructions: High Ability to understand verbal instructions: High Motivation Anxiety Level: Anxious Cooperation: Cooperative Education Importance: Acknowledges Need Interest in Health Problems: Asks Questions Perception: Coherent Willingness to Engage in Self-Management High Activities: Readiness to Engage in Self-Management High Activities: Electronic Signature(s) Signed: 10/17/2022 [name] By: Jake Pax RN Entered By: [name] on 10/16/2022 06:54:37 [name] ([id number]) [id number] Page 3 of 5 -------------------------------------------------------------------------------- Fall Risk Assessment Details Patient Name: Date of Service: [name], [name]  10/16/2022 9:30 A M Medical Record Number: [id number] Patient Account Number: [id number] Date of Birth/Sex: Treating RN: [name] (77 y.o. [name]) [name] Primary Care Shaquetta Arcos: [name] Other Clinician: Referring Kailyn Dubie: Treating Shekita Boyden/Extender: [name], [name], [name] in Treatment: 0 Fall Risk Assessment Items Have you had 2 or more falls in the last 12 monthso 0 No Have you had any fall that resulted in injury in the last 12 monthso 0 No FALLS RISK SCREEN History of falling - immediate or within 3 months 0 No  Secondary diagnosis (Do you have 2 or more medical diagnoseso) 0 No Ambulatory aid None/bed rest/wheelchair/nurse 0 Yes Crutches/cane/walker 0 No Furniture 0 No Intravenous therapy Access/Saline/Heparin Lock 0 No Gait/Transferring Normal/ bed rest/ wheelchair 0 Yes Weak (short steps with or without shuffle, stooped but able to lift head while walking, may seek 0 No support from furniture) Impaired (short steps with shuffle, may have difficulty arising from chair, head down, impaired 0 No balance) Mental Status Oriented to own ability 0 Yes Electronic Signature(s) Signed: 10/17/2022 [name] By: Jake Pax RN Entered By: [name] on 10/16/2022 06:54:46 -------------------------------------------------------------------------------- Foot Assessment Details Patient Name: Date of Service: [name] 10/16/2022 9:30 A M Medical Record Number: [id number] Patient Account Number: [id number] Date of Birth/Sex: Treating RN: [name] (77 y.o. [name]) [name] Primary Care Eira Alpert: [name] Other Clinician: Referring Kalyssa Anker: Treating Odaly Peri/Extender: [name], [name], [name] in Treatment: 0 Foot Assessment Items Site Locations [name], [name] ([id number]) [id number] Page 4 of 5 + = Sensation present, - = Sensation absent, C = Callus, U = Ulcer R = Redness, W = Warmth, M  = Maceration, PU = Pre-ulcerative lesion F = Fissure, S = Swelling, D = Dryness Assessment Right: Left: Other Deformity: No No Prior Foot Ulcer: No No Prior Amputation: No No Charcot Joint: No No Ambulatory Status: Ambulatory Without Help Gait: Steady Electronic Signature(s) Signed: 10/17/2022 [name] By: Jake Pax RN Entered By: [name] on 10/16/2022 06:55:07 -------------------------------------------------------------------------------- Nutrition Risk Screening Details Patient Name: Date of Service: [name], [name] 10/16/2022 9:30 A M Medical Record Number: [id number] Patient Account Number: [id number] Date of Birth/Sex: Treating RN: [birth year] (77 y.o. [name]) [name] Primary Care Urian Martenson: [name] Other Clinician: Referring Aiyanah Kalama: Treating Maniah Nading/Extender: [name], [name], [name] in Treatment: 0 Height (in): 65 Weight (lbs): 163 Body Mass Index (BMI): 27.1 Nutrition Risk Screening Items Score Screening NUTRITION RISK SCREEN: I have an illness or condition that made me change the kind and/or amount of food I eat 0 No I eat fewer than two meals per day 0 No I eat few fruits and vegetables, or milk products 0 No I have three or more drinks of beer, liquor or wine almost every day 0 No I have tooth or mouth problems that make it hard for me to eat 0 No I don't always have enough money to buy the food I need 0 No CORSON, AYBAR ([id number]) [phone] [id number] Page 5 of 5 I eat alone most of the time 0 No I take three or more different prescribed or over-the-counter drugs a day 1 Yes Without wanting to, I have lost or gained 10 pounds in the last six months 0 No I Johns not always physically able to shop, cook and/or feed myself 0 No Nutrition Protocols Good Risk Protocol 0 No interventions needed Moderate Risk Protocol High Risk Proctocol Risk Level: Good Risk Score: 1 Electronic Signature(s) Signed:  10/17/2022 [name] By: Jake Pax RN Entered By: [name] on 10/16/2022 06:54:56

## 2022-10-17 NOTE — Progress Notes (Signed)
RYA, ROEL ([id number]) [id number] Page 1 of 1 Visit Report for 10/16/2022 HBO Patient Questionnaire Details Patient Name: Date of Service: [name], [name] 10/16/2022 9:30 A M Medical Record Number: [id number] Patient Account Number: [id number] Date of Birth/Sex: Treating RN: 1945/02/25 (77 y.o. [name]) Yevonne Pax Primary Care Dreanna Kyllo: Marcelino Duster Other Clinician: Referring Nasim Garofano: Treating Stran Raper/Extender: RO BSO N, MICHA EL G Stoioff, Scott Weeks in Treatment: 0 HBO Patient Questionnaire Items Answer A "Yes" answers must be brought to the hyperbaric physician's attention. ny Breathing or Lung problemso No Currently use tobacco productso No Used tobacco products in the pasto Yes Heart problemso Yes Seen a doctor for any heart or blood pressure problemso Yes If yes, provide the name of doctor: DR PARASHCHOS Do you take water pills (diuretic)o No Diabeteso No Dialysiso No Eye problems like glaucomao No Ear problems or surgeryo No Sinus Problemso No Cancero Yes List the location: prostate, bladder Surgery(s) for cancero Yes Radiation therapy for cancero Yes List the location of the radiation therapy: bladder Last treatment for radiation therapyo 01/07/21 Chemotherapy for cancero Yes Last treatment for chemotherapyo o Confinement Anxiety (Claustrophobia- fear of confined places)o No Any medical implants/devices that are fully or partially implanted or attached to your bodyo No Pregnanto No Seizureso No Electronic Signature(s) Signed: 10/17/2022 11:58:03 AM By: Yevonne Pax RN Entered By: Yevonne Pax on 10/16/2022 07:46:28

## 2022-10-17 NOTE — Progress Notes (Addendum)
of Birth/Sex: Treating RN: April 12, 1945 (77 y.o. [name] Primary Care Provider: Marcelino Johns Other Clinician: Referring Provider: Treating Provider/Extender: RO BSO N, MICHA Johns Johns Johns, [name] in Treatment: 0 Constitutional Sitting or standing Blood Pressure is within target range for patient.. Pulse regular and within target range for patient.Marland Kitchen Respirations regular, non-labored and within target range.. Temperature is normal and within the target range for the patient.Marland Kitchen appears in no distress. Respiratory Respiratory effort is easy and symmetric bilaterally. Rate is normal at rest and on room air.. Bilateral breath sounds are clear and equal in all lobes with no wheezes, rales or rhonchi.. Cardiovascular Heart rhythm and rate regular, without murmur or gallop.. Genitourinary (GU) . Electronic Signature(s) Signed: 10/16/2022 5:20:34 PM By: Baltazar Najjar MD Entered By: Baltazar Najjar on 10/16/2022 09:21:11 -------------------------------------------------------------------------------- Physician Orders Details Patient Name: Date of Service: [name] 10/16/2022 9:30 A M Medical Record Number: [id number] Patient Account Number: [id number] Date of Birth/Sex: Treating RN: 05-Jun-1945 (77 y.o. [name]) [name] Primary Care Provider: Marcelino Johns Other Clinician: Referring Provider: Treating Provider/Extender: Chauncey Mann, MICHA Johns Althea Grimmer, [name] in Treatment: 0 [name], [name] ([id number]) [id number] Page 3 of 8 Verbal / Phone Orders: No Diagnosis Coding ICD-10 Coding Code Description N30.41 Irradiation cystitis with hematuria Z85.51 Personal history of malignant neoplasm of bladder Z85.46 Personal history of malignant neoplasm of prostate I25.5 Ischemic cardiomyopathy Follow-up Appointments Other: - once approved Hyperbaric  Oxygen Therapy Evaluate for HBO Therapy Indication and location: - radiation cystitis If appropriate for treatment, begin HBOT per protocol: 2.0 ATA for 90 Minutes without A Breaks ir One treatment per day (delivered Monday through Friday unless otherwise specified in Special Instructions below): Total # of Treatments: - 40 A ntihistamine 30 minutes prior to HBO Treatment, difficulty clearing ears. 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.41 - Irradiation cystitis with hematuria) Electronic Signature(s) Signed: 10/16/2022 12:43:47 PM By: Baltazar Najjar MD Previous Signature: 10/16/2022 11:36:10 AM Version By: Jake Pax RN Entered By: Baltazar Najjar on 10/16/2022 09:43:47 Prescription 10/16/2022 -------------------------------------------------------------------------------- [name] MD Patient Name: Provider: [birth year] [id number] Date of Birth: NPI#[place] Stage Sex: DEA #: [phone] Phone #: License #: [id number] UPN: Patient Address: [address] [facility] [place], [place] [address] [facility] [address], [address] [place], [place] [address] [phone] Allergies cidex Provider's Orders X-ray, Chest - ICD10: N30.41 - prerequisite for Hyperbaric chamber treatment Hand Signature: Date(s): Electronic Signature(s) Signed: 10/16/2022 5:20:34 PM By: Baltazar Najjar MD Entered By: Baltazar Najjar on 10/16/2022 09:43:47 [name] ([id number]) [id number] Page 4 of 8 -------------------------------------------------------------------------------- Problem List Details Patient Name: Date of Service: [name], [name] 10/16/2022 9:30 A M Medical Record Number: [id number] Patient Account Number: [id number] Date of Birth/Sex: Treating RN: 1945-10-28 (77 y.o. [name]) [name] Primary Care Provider: Marcelino Johns  Other Clinician: Referring Provider: Treating Provider/Extender: RO BSO N, MICHA Johns Althea Grimmer, [name] in Treatment: 0 Active Problems ICD-10 Encounter Code Description Active Date MDM Diagnosis N30.41 Irradiation cystitis with hematuria 10/16/2022 No Yes Z85.51 Personal history of malignant neoplasm of bladder 10/16/2022 No Yes Z85.46 Personal history of malignant neoplasm of prostate 10/16/2022 No Yes I25.5 Ischemic cardiomyopathy 10/16/2022 No Yes Inactive Problems Resolved Problems Electronic Signature(s) Signed: 10/16/2022 5:20:34 PM By: Baltazar Najjar MD Entered By: Baltazar Najjar on 10/16/2022 07:44:18 -------------------------------------------------------------------------------- Progress Note Details Patient Name: Date of Service: [name]. 10/16/2022 9:30  could only be hopeful that would help with the urinary frequency 6. We will put him through his insurance which I think is a [employer] advantage plan. He is going to the beach and mid-September would look to starting him at 2 atm for 40 treatments in mid September Electronic Signature(s) Signed: 11/10/2022 3:54:37 PM By: Baltazar Najjar MD Previous Signature: 10/16/2022 5:20:34 PM Version By: Baltazar Najjar MD Entered By: Baltazar Najjar on 11/10/2022 12:54:37 [name] ([id number]) [id number] Page 7 of 8 -------------------------------------------------------------------------------- ROS/PFSH Details Patient Name: Date of Service: [name], [name] 10/16/2022 9:30 A M Medical Record Number: [id number] Patient Account Number: [id number] Date of Birth/Sex: Treating RN: 11-29-1945 (77 y.o. [name]) [name] Primary Care Provider: Marcelino Johns Other Clinician: Referring Provider: Treating Provider/Extender: RO BSO N, MICHA Johns Johns Johns, [name] in Treatment: 0 Cardiovascular Medical History: Positive for: Coronary Artery Disease; Hypertension Immunizations Pneumococcal Vaccine: Received Pneumococcal Vaccination: Yes Received Pneumococcal Vaccination On or After 60th Birthday: Yes Implantable Devices None Family and Social  History Former smoker; Marital Status - Married; Alcohol Use: Daily; Drug Use: No History; Caffeine Use: Daily Electronic Signature(s) Signed: 10/16/2022 5:20:34 PM By: Baltazar Najjar MD Signed: 10/17/2022 11:58:03 AM By: Jake Pax RN Entered By: [name] on 10/16/2022 06:53:41 -------------------------------------------------------------------------------- SuperBill Details Patient Name: Date of Service: [name] 10/16/2022 Medical Record Number: [id number] Patient Account Number: [id number] Date of Birth/Sex: Treating RN: [birth year] (77 y.o. [name] Primary Care Provider: Marcelino Johns Other Clinician: Referring Provider: Treating Provider/Extender: RO BSO N, MICHA Johns Johns Johns, [name] in Treatment: 0 Diagnosis Coding ICD-10 Codes Code Description N30.41 Irradiation cystitis with hematuria Z85.51 Personal history of malignant neoplasm of bladder Z85.46 Personal history of malignant neoplasm of prostate [name], [name] ([id number]) [id number] Page 8 of 8 I25.5 Ischemic cardiomyopathy Facility Procedures : CPT4 Code: 93235573 Description: 9298210514 - WOUND CARE VISIT-LEV 2 EST PT Modifier: Quantity: 1 Physician Procedures : CPT4 Code Description Modifier [id number] - WC PHYS LEVEL 4 - NEW PT ICD-10 Diagnosis Description N30.41 Irradiation cystitis with hematuria Z85.51 Personal history of malignant neoplasm of bladder Z85.46 Personal history of malignant neoplasm of  prostate I25.5 Ischemic cardiomyopathy Quantity: 1 Electronic Signature(s) Signed: 10/16/2022 5:20:34 PM By: Baltazar Najjar MD Entered By: Baltazar Najjar on 10/16/2022 09:44:32  could only be hopeful that would help with the urinary frequency 6. We will put him through his insurance which I think is a [employer] advantage plan. He is going to the beach and mid-September would look to starting him at 2 atm for 40 treatments in mid September Electronic Signature(s) Signed: 11/10/2022 3:54:37 PM By: Baltazar Najjar MD Previous Signature: 10/16/2022 5:20:34 PM Version By: Baltazar Najjar MD Entered By: Baltazar Najjar on 11/10/2022 12:54:37 [name] ([id number]) [id number] Page 7 of 8 -------------------------------------------------------------------------------- ROS/PFSH Details Patient Name: Date of Service: [name], [name] 10/16/2022 9:30 A M Medical Record Number: [id number] Patient Account Number: [id number] Date of Birth/Sex: Treating RN: 11-29-1945 (77 y.o. [name]) [name] Primary Care Provider: Marcelino Johns Other Clinician: Referring Provider: Treating Provider/Extender: RO BSO N, MICHA Johns Johns Johns, [name] in Treatment: 0 Cardiovascular Medical History: Positive for: Coronary Artery Disease; Hypertension Immunizations Pneumococcal Vaccine: Received Pneumococcal Vaccination: Yes Received Pneumococcal Vaccination On or After 60th Birthday: Yes Implantable Devices None Family and Social  History Former smoker; Marital Status - Married; Alcohol Use: Daily; Drug Use: No History; Caffeine Use: Daily Electronic Signature(s) Signed: 10/16/2022 5:20:34 PM By: Baltazar Najjar MD Signed: 10/17/2022 11:58:03 AM By: Jake Pax RN Entered By: [name] on 10/16/2022 06:53:41 -------------------------------------------------------------------------------- SuperBill Details Patient Name: Date of Service: [name] 10/16/2022 Medical Record Number: [id number] Patient Account Number: [id number] Date of Birth/Sex: Treating RN: [birth year] (77 y.o. [name] Primary Care Provider: Marcelino Johns Other Clinician: Referring Provider: Treating Provider/Extender: RO BSO N, MICHA Johns Johns Johns, [name] in Treatment: 0 Diagnosis Coding ICD-10 Codes Code Description N30.41 Irradiation cystitis with hematuria Z85.51 Personal history of malignant neoplasm of bladder Z85.46 Personal history of malignant neoplasm of prostate [name], [name] ([id number]) [id number] Page 8 of 8 I25.5 Ischemic cardiomyopathy Facility Procedures : CPT4 Code: 93235573 Description: [id number] - WOUND CARE VISIT-LEV 2 EST PT Modifier: Quantity: 1 Physician Procedures : CPT4 Code Description Modifier [id number] - WC PHYS LEVEL 4 - NEW PT ICD-10 Diagnosis Description N30.41 Irradiation cystitis with hematuria Z85.51 Personal history of malignant neoplasm of bladder Z85.46 Personal history of malignant neoplasm of  prostate I25.5 Ischemic cardiomyopathy Quantity: 1 Electronic Signature(s) Signed: 10/16/2022 5:20:34 PM By: Baltazar Najjar MD Entered By: Baltazar Najjar on 10/16/2022 09:44:32  A M Medical Record Number: [id number] Patient Account Number: [id number] Date of Birth/Sex: Treating RN: 1945/04/30 (77 y.o. [name]) [name] Primary Care Provider: Marcelino Johns Other Clinician: Referring Provider: Treating Provider/Extender: RO BSO Dorris Carnes, MICHA Johns Althea Grimmer, [name] in Treatment: 0 [name], [name] ([id number]) [id number] Page 5 of 8 Subjective Chief Complaint Information obtained from Patient 10/16/2022; patient is here referred by his urologist Dr. Irineo Axon for consideration of hyperbaric treatment for recurrent radiation cystitis History of Present Illness (HPI) ADMISSION 10/16/2022 This is a 77 year old man who arrived in clinic accompanied by his wife. 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 This recurred in 2017 he was treated with intravascular gemcitabine for 6 Johns. He had a TURBT on September 2022 for a small focus of high-grade features and he was treated with gemcitabine for 6 treatments and completed on 01/07/2021. 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 The patient has had several episodes of gross hematuria. This has been sometimes associated with possibility of UTI. He had a cystogram earlier this year that showed changes suggestive of radiation cystitis. The last postvoid residual urine I see was 127 mL. He was referred here by urology for consideration of hyperbaric oxygen therapy. The patient does have a history of coronary artery disease. 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 His left ventricular ejection fraction in 2014 was 50%. He has an EKG done on June 7 that showed a right bundle branch block. The patient denies any exertional chest pain. He walks 2-1/2 miles on the treadmill but does not feel limited by shortness of breath or chest pain. Past medical history including prostate cancer and urothelial bladder cancer. CABG x 4, mild ischemic cardiomyopathy, hypertension, hyperlipidemia, prostate CA, ejection fraction in the 45 to 50% range. History of adenomatous polyps. Abdominal aortic aneurysm imaging study I looked at it 3 cm. He quit smoking 16 years ago The patient has significant urinary frequency postvoid residual urine seems to be in the 127 cc range. He has been told that he is microscopically bleeding even even when he does not have gross hematuria based on anemia. Hemoglobin that we saw from a CBC 3 Johns ago was 30 was 9.7 his white count was 21.3. EKG showed a right bundle branch block I will need to try and check the chronicity of this I have reviewed his EKGs back to 2017 the right bundle branch is" chronic and his EKG is stable Patient History Allergies cidex Social History Former smoker, Marital Status - Married, Alcohol Use - Daily, Drug Use - No History, Caffeine Use - Daily. Medical History Cardiovascular Patient has history of Coronary Artery Disease, Hypertension Objective Constitutional Sitting  or standing Blood Pressure is within target range for patient.. Pulse regular and within target range for patient.Marland Kitchen Respirations regular, non-labored and within target range.. Temperature is normal and within the target range for the patient.Marland Kitchen appears in no distress. Vitals Time Taken: 9:49 AM, Height: 65 in, Source: Stated, Weight: 163 lbs, Source: Stated, BMI: 27.1, Temperature: 98.5 F, Pulse: 65 bpm, Respiratory Rate: 18 breaths/min, Blood Pressure: 134/69 mmHg. Respiratory Respiratory effort is easy and symmetric bilaterally. Rate is normal at rest and on room air.. Bilateral breath sounds are clear and equal in all lobes with no wheezes, rales or rhonchi.. Cardiovascular Heart rhythm  of Birth/Sex: Treating RN: April 12, 1945 (77 y.o. [name] Primary Care Provider: Marcelino Johns Other Clinician: Referring Provider: Treating Provider/Extender: RO BSO N, MICHA Johns Johns Johns, [name] in Treatment: 0 Constitutional Sitting or standing Blood Pressure is within target range for patient.. Pulse regular and within target range for patient.Marland Kitchen Respirations regular, non-labored and within target range.. Temperature is normal and within the target range for the patient.Marland Kitchen appears in no distress. Respiratory Respiratory effort is easy and symmetric bilaterally. Rate is normal at rest and on room air.. Bilateral breath sounds are clear and equal in all lobes with no wheezes, rales or rhonchi.. Cardiovascular Heart rhythm and rate regular, without murmur or gallop.. Genitourinary (GU) . Electronic Signature(s) Signed: 10/16/2022 5:20:34 PM By: Baltazar Najjar MD Entered By: Baltazar Najjar on 10/16/2022 09:21:11 -------------------------------------------------------------------------------- Physician Orders Details Patient Name: Date of Service: [name] 10/16/2022 9:30 A M Medical Record Number: [id number] Patient Account Number: [id number] Date of Birth/Sex: Treating RN: 05-Jun-1945 (77 y.o. [name]) [name] Primary Care Provider: Marcelino Johns Other Clinician: Referring Provider: Treating Provider/Extender: Chauncey Mann, MICHA Johns Althea Grimmer, [name] in Treatment: 0 [name], [name] ([id number]) [id number] Page 3 of 8 Verbal / Phone Orders: No Diagnosis Coding ICD-10 Coding Code Description N30.41 Irradiation cystitis with hematuria Z85.51 Personal history of malignant neoplasm of bladder Z85.46 Personal history of malignant neoplasm of prostate I25.5 Ischemic cardiomyopathy Follow-up Appointments Other: - once approved Hyperbaric  Oxygen Therapy Evaluate for HBO Therapy Indication and location: - radiation cystitis If appropriate for treatment, begin HBOT per protocol: 2.0 ATA for 90 Minutes without A Breaks ir One treatment per day (delivered Monday through Friday unless otherwise specified in Special Instructions below): Total # of Treatments: - 40 A ntihistamine 30 minutes prior to HBO Treatment, difficulty clearing ears. 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.41 - Irradiation cystitis with hematuria) Electronic Signature(s) Signed: 10/16/2022 12:43:47 PM By: Baltazar Najjar MD Previous Signature: 10/16/2022 11:36:10 AM Version By: Jake Pax RN Entered By: Baltazar Najjar on 10/16/2022 09:43:47 Prescription 10/16/2022 -------------------------------------------------------------------------------- [name] MD Patient Name: Provider: [birth year] [id number] Date of Birth: NPI#[place] Stage Sex: DEA #: [phone] Phone #: License #: [id number] UPN: Patient Address: [address] [facility] [place], [place] [address] [facility] [address], [address] [place], [place] [address] [phone] Allergies cidex Provider's Orders X-ray, Chest - ICD10: N30.41 - prerequisite for Hyperbaric chamber treatment Hand Signature: Date(s): Electronic Signature(s) Signed: 10/16/2022 5:20:34 PM By: Baltazar Najjar MD Entered By: Baltazar Najjar on 10/16/2022 09:43:47 [name] ([id number]) [id number] Page 4 of 8 -------------------------------------------------------------------------------- Problem List Details Patient Name: Date of Service: [name], [name] 10/16/2022 9:30 A M Medical Record Number: [id number] Patient Account Number: [id number] Date of Birth/Sex: Treating RN: 1945-10-28 (77 y.o. [name]) [name] Primary Care Provider: Marcelino Johns  Other Clinician: Referring Provider: Treating Provider/Extender: RO BSO N, MICHA Johns Althea Grimmer, [name] in Treatment: 0 Active Problems ICD-10 Encounter Code Description Active Date MDM Diagnosis N30.41 Irradiation cystitis with hematuria 10/16/2022 No Yes Z85.51 Personal history of malignant neoplasm of bladder 10/16/2022 No Yes Z85.46 Personal history of malignant neoplasm of prostate 10/16/2022 No Yes I25.5 Ischemic cardiomyopathy 10/16/2022 No Yes Inactive Problems Resolved Problems Electronic Signature(s) Signed: 10/16/2022 5:20:34 PM By: Baltazar Najjar MD Entered By: Baltazar Najjar on 10/16/2022 07:44:18 -------------------------------------------------------------------------------- Progress Note Details Patient Name: Date of Service: [name]. 10/16/2022 9:30

## 2022-10-17 NOTE — Progress Notes (Addendum)
SALOME, MASSENA ([id number]) [id number] Page 1 of 7 Visit Report for 10/16/2022 Allergy List Details Patient Name: Date of Service: [name], [name] 10/16/2022 9:30 A M Medical Record Number: [id number] Patient Account Number: [id number] Date of Birth/Sex: Treating RN: [birth year] (77 y.o. [name]) Yevonne Pax Primary Care Exodus Kutzer: Marcelino Duster Other Clinician: Referring Timothee Gali: Treating Wyvonne Carda/Extender: RO BSO N, MICHA EL Elveria Rising, John Weeks in Treatment: 0 Allergies Active Allergies cidex Type: Allergen Allergy Notes Electronic Signature(s) Signed: 10/17/2022 11:58:03 AM By: Yevonne Pax RN Entered By: Yevonne Pax on 10/16/2022 09:52:00 -------------------------------------------------------------------------------- Arrival Information Details Patient Name: Date of Service: [name] 10/16/2022 9:30 A M Medical Record Number: [id number] Patient Account Number: [id number] Date of Birth/Sex: Treating RN: [birth year] (77 y.o. [name]) Yevonne Pax Primary Care Prajwal Fellner: Marcelino Duster Other Clinician: Referring Alize Acy: Treating Quante Pettry/Extender: RO BSO Dorris Carnes, MICHA EL Chauncy Passy in Treatment: 0 Visit Information Patient Arrived: Ambulatory Arrival Time: 09:30 Accompanied By: wife Transfer Assistance: None Patient Identification Verified: Yes Secondary Verification Process Completed: Yes Patient Requires Transmission-Based Precautions: No Patient Has Alerts: No Electronic Signature(s) Signed: 10/17/2022 11:58:03 AM By: Yevonne Pax RN Entered By: Yevonne Pax on 10/16/2022 09:48:22 [name] ([id number]) [id number] Page 2 of 7 -------------------------------------------------------------------------------- Clinic Level of Care Assessment Details Patient Name: Date of Service: [name], [name] 10/16/2022 9:30 A M Medical Record Number: [id number] Patient Account Number: [id number] Date of  Birth/Sex: Treating RN: 19-Nov-1945 (77 y.o. [name]) Yevonne Pax Primary Care Edgar Corrigan: Marcelino Duster Other Clinician: Referring Vivyan Biggers: Treating Crawford Tamura/Extender: RO BSO N, MICHA EL Elveria Rising, John Weeks in Treatment: 0 Clinic Level of Care Assessment Items TOOL 4 Quantity Score X- 1 0 Use when only an EandM is performed on FOLLOW-UP visit ASSESSMENTS - Nursing Assessment / Reassessment X- 1 10 Reassessment of Co-morbidities (includes updates in patient status) X- 1 5 Reassessment of Adherence to Treatment Plan ASSESSMENTS - Wound and Skin A ssessment / Reassessment []  - 0 Simple Wound Assessment / Reassessment - one wound []  - 0 Complex Wound Assessment / Reassessment - multiple wounds []  - 0 Dermatologic / Skin Assessment (not related to wound area) ASSESSMENTS - Focused Assessment []  - 0 Circumferential Edema Measurements - multi extremities []  - 0 Nutritional Assessment / Counseling / Intervention []  - 0 Lower Extremity Assessment (monofilament, tuning fork, pulses) []  - 0 Peripheral Arterial Disease Assessment (using hand held doppler) ASSESSMENTS - Ostomy and/or Continence Assessment and Care []  - 0 Incontinence Assessment and Management []  - 0 Ostomy Care Assessment and Management (repouching, etc.) PROCESS - Coordination of Care []  - 0 Simple Patient / Family Education for ongoing care []  - 0 Complex (extensive) Patient / Family Education for ongoing care []  - 0 Staff obtains Chiropractor, Records, T Results / Process Orders est []  - 0 Staff telephones HHA, Nursing Homes / Clarify orders / etc []  - 0 Routine Transfer to another Facility (non-emergent condition) []  - 0 Routine Hospital Admission (non-emergent condition) X- 1 15 New Admissions / Manufacturing engineer / Ordering NPWT Apligraf, etc. , []  - 0 Emergency Hospital Admission (emergent condition) X- 1 10 Simple Discharge Coordination []  - 0 Complex (extensive) Discharge Coordination PROCESS -  Special Needs []  - 0 Pediatric / Minor Patient Management []  - 0 Isolation Patient Management [name], [name] ([id number]) [id number] Page 3 of 7 []  - 0 Hearing / Language / Visual special needs []  - 0 Assessment of Community assistance (transportation, D/C planning, etc.) []  - 0 Additional assistance / Altered mentation []  -  0 Support Surface(s) Assessment (bed, cushion, seat, etc.) INTERVENTIONS - Wound Cleansing / Measurement []  - 0 Simple Wound Cleansing - one wound []  - 0 Complex Wound Cleansing - multiple wounds []  - 0 Wound Imaging (photographs - any number of wounds) []  - 0 Wound Tracing (instead of photographs) []  - 0 Simple Wound Measurement - one wound []  - 0 Complex Wound Measurement - multiple wounds INTERVENTIONS - Wound Dressings []  - 0 Small Wound Dressing one or multiple wounds []  - 0 Medium Wound Dressing one or multiple wounds []  - 0 Large Wound Dressing one or multiple wounds []  - 0 Application of Medications - topical []  - 0 Application of Medications - injection INTERVENTIONS - Miscellaneous []  - 0 External ear exam []  - 0 Specimen Collection (cultures, biopsies, blood, body fluids, etc.) []  - 0 Specimen(s) / Culture(s) sent or taken to Lab for analysis []  - 0 Patient Transfer (multiple staff / Nurse, adult / Similar devices) []  - 0 Simple Staple / Suture removal (25 or less) []  - 0 Complex Staple / Suture removal (26 or more) []  - 0 Hypo / Hyperglycemic Management (close monitor of Blood Glucose) []  - 0 Ankle / Brachial Index (ABI) - do not check if billed separately X- 1 5 Vital Signs Has the patient been seen at the hospital within the last three years: Yes Total Score: 45 Level Of Care: New/Established - Level 2 Electronic Signature(s) Signed: 10/17/2022 11:58:03 AM By: Yevonne Pax RN Entered By: Yevonne Pax on 10/16/2022  10:07:02 -------------------------------------------------------------------------------- Encounter Discharge Information Details Patient Name: Date of Service: [name] 10/16/2022 9:30 A M Medical Record Number: [id number] Patient Account Number: [id number] Date of Birth/Sex: Treating RN: May 19, 1945 (77 y.o. [name] Primary Care Caitland Porchia: Marcelino Duster Other Clinician: Referring Dameka Younker: Treating Micheala Morissette/Extender: RO BSO Dorris Carnes, MICHA EL Elveria Rising, John Weeks in Treatment: 0 [name], [name] ([id number]) [id number] Page 4 of 7 Encounter Discharge Information Items Discharge Condition: Stable Ambulatory Status: Ambulatory Discharge Destination: Home Transportation: Private Auto Accompanied By: wife Schedule Follow-up Appointment: Yes Clinical Summary of Care: Electronic Signature(s) Signed: 10/17/2022 11:58:03 AM By: Yevonne Pax RN Entered By: Yevonne Pax on 10/16/2022 10:08:12 -------------------------------------------------------------------------------- Lower Extremity Assessment Details Patient Name: Date of Service: [name], [name] 10/16/2022 9:30 A M Medical Record Number: [id number] Patient Account Number: [id number] Date of Birth/Sex: Treating RN: Dec 16, 1945 (77 y.o. [name]) Yevonne Pax Primary Care Prescious Hurless: Marcelino Duster Other Clinician: Referring Lizzett Nobile: Treating Gentri Guardado/Extender: RO BSO Dorris Carnes, MICHA EL Elveria Rising, John Weeks in Treatment: 0 Electronic Signature(s) Signed: 10/17/2022 11:58:03 AM By: Yevonne Pax RN Entered By: Yevonne Pax on 10/16/2022 09:50:01 -------------------------------------------------------------------------------- Multi Wound Chart Details Patient Name: Date of Service: [name] 10/16/2022 9:30 A M Medical Record Number: [id number] Patient Account Number: [id number] Date of Birth/Sex: Treating RN: 1945-09-06 (77 y.o. [name]) Yevonne Pax Primary Care Kenly Henckel: Marcelino Duster Other  Clinician: Referring Roque Schill: Treating Benito Lemmerman/Extender: RO BSO N, MICHA EL Elveria Rising, John Weeks in Treatment: 0 Vital Signs Height(in): 65 Pulse(bpm): 65 Weight(lbs): 163 Blood Pressure(mmHg): 134/69 Body Mass Index(BMI): 27.1 Temperature(F): 98.5 Respiratory Rate(breaths/min): 18 [name], [name] ([id number]) Electronic Signature(s) Signed: 10/17/2022 11:58:03 AM By: Yevonne Pax RN Entered By: Yevonne Pax on 10/16/2022 09:55:16 -------------------------------------------------------------------------------- Multi-Disciplinary Care Plan Details Patient Name: Date of Service: [name] 10/16/2022 9:30 A M Medical Record Number: [id number] Patient Account Number: [id number] Date of Birth/Sex: Treating RN: Mar 27, 1945 (77 y.o. [name] Primary Care Tavonna Worthington: Marcelino Duster Other Clinician: Referring Claudio Mondry: Treating  Hitoshi Werts/Extender: RO BSO N, MICHA EL Elveria Rising, John Weeks in Treatment: 0 Active Inactive Psychologist, prison and probation services) Signed: 12/23/2022 3:50:07 PM By: Elliot Gurney, BSN, RN, CWS, Kim RN, BSN Signed: 12/31/2022 4:44:34 PM By: Yevonne Pax RN Previous Signature: 10/17/2022 11:58:03 AM Version By: Yevonne Pax RN Entered By: Elliot Gurney, BSN, RN, CWS, Kim on 12/23/2022 15:50:07 -------------------------------------------------------------------------------- Pain Assessment Details Patient Name: Date of Service: [name] 10/16/2022 9:30 A M Medical Record Number: [id number] Patient Account Number: [id number] Date of Birth/Sex: Treating RN: 1946-01-12 (77 y.o. [name] Primary Care Saiquan Hands: Marcelino Duster Other Clinician: Referring Zenith Kercheval: Treating Sircharles Holzheimer/Extender: RO BSO N, MICHA EL Elveria Rising, John Weeks in Treatment: 0 Active Problems Location of Pain Severity and Description of Pain Patient Has Paino No Site Locations [name], [name] ([id number]) [id number] Page 6 of 7 Pain Management and  Medication Current Pain Management: Electronic Signature(s) Signed: 10/17/2022 11:58:03 AM By: Yevonne Pax RN Entered By: Yevonne Pax on 10/16/2022 09:49:17 -------------------------------------------------------------------------------- Patient/Caregiver Education Details Patient Name: Date of Service: [name] 8/29/2024andnbsp9:30 A M Medical Record Number: [id number] Patient Account Number: [id number] Date of Birth/Gender: Treating RN: 1945-12-03 (77 y.o. [name] Primary Care Physician: Marcelino Duster Other Clinician: Referring Physician: Treating Physician/Extender: RO BSO N, MICHA EL Chauncy Passy in Treatment: 0 Education Assessment Education Provided To: Patient Education Topics Provided Welcome [facility]New Patient Packet: o Handouts: Welcome T [facility] o Methods: Explain/Verbal Responses: State content correctly Electronic Signature(s) Signed: 10/17/2022 11:58:03 AM By: Yevonne Pax RN Entered By: Yevonne Pax on 10/16/2022 09:56:56 [name] ([id number]) [id number] Page 7 of 7 -------------------------------------------------------------------------------- Vitals Details Patient Name: Date of Service: [name], [name] 10/16/2022 9:30 A M Medical Record Number: [id number] Patient Account Number: [id number] Date of Birth/Sex: Treating RN: 09-Jan-1946 (77 y.o. [name]) Yevonne Pax Primary Care Tempie Gibeault: Marcelino Duster Other Clinician: Referring Hector Venne: Treating Kairav Russomanno/Extender: RO BSO N, MICHA EL Elveria Rising, John Weeks in Treatment: 0 Vital Signs Time Taken: 09:49 Temperature (F): 98.5 Height (in): 65 Pulse (bpm): 65 Source: Stated Respiratory Rate (breaths/min): 18 Weight (lbs): 163 Blood Pressure (mmHg): 134/69 Source: Stated Reference Range: 80 - 120 mg / dl Body Mass Index (BMI): 27.1 Electronic Signature(s) Signed: 10/17/2022 11:58:03 AM By: Yevonne Pax RN Entered  By: Yevonne Pax on 10/16/2022 09:49:47

## 2022-10-24 ENCOUNTER — Ambulatory Visit: Payer: Medicare Other | Admitting: Physician Assistant

## 2022-11-17 ENCOUNTER — Encounter: Payer: Medicare Other | Attending: Physician Assistant | Admitting: Physician Assistant

## 2022-11-17 DIAGNOSIS — N3041 Irradiation cystitis with hematuria: Secondary | ICD-10-CM | POA: Diagnosis present

## 2022-11-17 DIAGNOSIS — Z8551 Personal history of malignant neoplasm of bladder: Secondary | ICD-10-CM | POA: Diagnosis not present

## 2022-11-17 DIAGNOSIS — Z8546 Personal history of malignant neoplasm of prostate: Secondary | ICD-10-CM | POA: Diagnosis not present

## 2022-11-17 DIAGNOSIS — I255 Ischemic cardiomyopathy: Secondary | ICD-10-CM | POA: Insufficient documentation

## 2022-11-17 NOTE — Progress Notes (Signed)
AUBERT, CHOYCE ([id number]) [id number] Page 1 of 3 Visit Report for 11/17/2022 HBO Details Patient Name: Date of Service: [name], SCAIFE 11/17/2022 1:30 PM Medical Record Number: [id number] Patient Account Number: [id number] Date of Birth/Sex: Treating RN: 02/15/1946 (77 y.o. M) Primary Care Mycah Formica: Marcelino Duster Other Clinician: Referring Marvelous Woolford: Treating Zakery Normington/Extender: Joylene Grapes in Treatment: 4 HBO Treatment Course Details Treatment Course Number: 1 Ordering Mikaylee Arseneau: Adrian Blackwater Treatments Ordered: otal 40 HBO Treatment Start Date: 11/17/2022 HBO Indication: Other (specify in Notes) Notes: Irradiation cystitis with hematuria HBO Treatment Details Treatment Number: 1 Patient Type: Outpatient Chamber Type: Monoplace Chamber Serial #: [id number] Treatment Protocol: 2.0 ATA with 90 minutes oxygen, and no air breaks Treatment Details Compression Rate Down: 2.0 psi / minute De-Compression Rate Up: 1.0 psi / minute Air breaks and breathing Decompress Decompress Compress Tx Pressure Begins Reached periods Begins Ends (leave unused spaces blank) Chamber Pressure (ATA 1 2 ------2 1 ) Clock Time (24 hr) 13:41 13:58 - - - - - - 15:29 15:43 Treatment Length: 122 (minutes) Treatment Segments: 4 Vital Signs Capillary Blood Glucose Reference Range: 80 - 120 mg / dl HBO Diabetic Blood Glucose Intervention Range: <131 mg/dl or >440 mg/dl Time Vitals Blood Respiratory Capillary Blood Glucose Pulse Action Type: Pulse: Temperature: Taken: Pressure: Rate: Glucose (mg/dl): Meter #: Oximetry (%) Taken: Pre 01:00 102/68 70 16 98 98 Post 15:43 110/74 70 16 97.8 Pre-Treatment Ear Evaluation Left Right Clear: Yes Clear: Yes Intact: Yes Intact: Yes Color: gray Color: gray PE Tubes inserted: No PE Tubes inserted: No Irrigated: No Irrigated: No Myringotomy performed: No Myringotomy performed: No Left T Scale: eed  Grade 0 Right T Scale: eed Grade 0 Treatment Response Treatment Toleration: Well Treatment Completion Status: Treatment Completed without Adverse Event KAVIAN, PETERS ([id number]) [id number] Page 2 of 3 Post Treatment Teed Score Post Treatment T Score: Left Ear eed Grade 0 Post Treatment T Score: Right Ear eed Grade 0 HBO Attestation I certify that I supervised this HBO treatment in accordance with Medicare guidelines. A trained emergency response team is readily available per Yes hospital policies and procedures. Continue HBOT as ordered. Yes Electronic Signature(s) Signed: 11/17/2022 4:49:23 PM By: Allen Derry PA-C Previous Signature: 11/17/2022 3:55:47 PM Version By: Demetria Pore Entered By: Allen Derry on 11/17/2022 16:49:23 -------------------------------------------------------------------------------- HBO Patient Questionnaire Details Patient Name: Date of Service: [name] 11/17/2022 1:30 PM Medical Record Number: [id number] Patient Account Number: [id number] Date of Birth/Sex: Treating RN: April 28, 1945 (77 y.o. M) Primary Care Creed Kail: Marcelino Duster Other Clinician: Referring Okie Jansson: Treating Sheron Robin/Extender: Joylene Grapes in Treatment: 4 HBO Patient Questionnaire Items Answer A "Yes" answers must be brought to the hyperbaric physician's attention. ny Breathing or Lung problemso No Currently use tobacco productso No Used tobacco products in the pasto No Heart problemso No Do you take water pills (diuretic)o No Diabeteso No Dialysiso No Eye problems like glaucomao No Ear problems or surgeryo No Sinus Problemso Yes Cancero No Confinement Anxiety (Claustrophobia- fear of confined places)o No Any medical implants/devices that are fully or partially implanted or attached to your bodyo No Pregnanto No Seizureso No Date of last: Chest x-ray: 10/17/2022 EKG: 07/25/2022 CBC: 10/30/2022 Electronic  Signature(s) Signed: 11/17/2022 3:55:47 PM By: Demetria Pore Entered By: Demetria Pore on 11/17/2022 13:54:09 [name] ([id number]) [id number] Page 3 of 3 -------------------------------------------------------------------------------- HBO Safety Checklist Details Patient Name: Date of Service: [name], [name] 11/17/2022 1:30 PM Medical Record Number: [id number] Patient  Account Number: [id number] Date of Birth/Sex: Treating RN: [birth year] (77 y.o. M) Primary Care Salia Cangemi: Marcelino Duster Other Clinician: Referring Danine Hor: Treating Leva Baine/Extender: Joylene Grapes in Treatment: 4 HBO Safety Checklist Items Safety Checklist Consent Form Signed Patient voided / foley secured and emptied When did you last eato 11/17/22 Last dose of injectable or oral agent 11/17/22 Ostomy pouch emptied and vented if applicable NA All implantable devices assessed, documented and approved NA Intravenous access site secured and place NA Valuables secured Linens and cotton and cotton/polyester blend (less than 51% polyester) Personal oil-based products / skin lotions / body lotions removed Wigs or hairpieces removed NA Smoking or tobacco materials removed NA Books / newspapers / magazines / loose paper removed Cologne, aftershave, perfume and deodorant removed Jewelry removed (may wrap wedding band) Make-up removed NA Hair care products removed Battery operated devices (external) removed Heating patches and chemical warmers removed NA Titanium eyewear removed NA Nail polish cured greater than 10 hours NA Casting material cured greater than 10 hours NA Hearing aids removed NA Loose dentures or partials removed NA Prosthetics have been removed Patient demonstrates correct use of air break device (if applicable) Patient concerns have been addressed Patient grounding bracelet on and cord attached to chamber Specifics for Inpatients (complete  in addition to above) Medication sheet sent with patient NA Intravenous medications needed or due during therapy sent with patient NA Drainage tubes (e.g. nasogastric tube or chest tube secured and vented) NA Endotracheal or Tracheotomy tube secured NA Cuff deflated of air and inflated with saline NA Airway suctioned NA Electronic Signature(s) Signed: 11/17/2022 3:55:47 PM By: Demetria Pore Entered By: Demetria Pore on 11/17/2022 15:55:00

## 2022-11-17 NOTE — Progress Notes (Signed)
GLENDEL, JAGGERS ([id number]) [id number] Page 1 of 2 Visit Report for 11/17/2022 Arrival Information Details Patient Name: Date of Service: KWAN, SHELLHAMMER 11/17/2022 1:30 PM Medical Record Number: [id number] Patient Account Number: [id number] Date of Birth/Sex: Treating RN: 1945/10/28 (77 y.o. M) Primary Care Tally Mckinnon: Marcelino Duster Other Clinician: Referring Danaye Sobh: Treating Hser Belanger/Extender: Joylene Grapes in Treatment: 4 Visit Information History Since Last Visit Added or deleted any medications: No Patient Arrived: Ambulatory Any new allergies or adverse reactions: No Arrival Time: 01:00 Had a fall or experienced change in No Accompanied By: self activities of daily living that may affect Transfer Assistance: None risk of falls: Patient Identification Verified: Yes Signs or symptoms of abuse/neglect since last visito No Secondary Verification Process Completed: Yes Implantable device outside of the clinic excluding No Patient Requires Transmission-Based Precautions: No cellular tissue based products placed in the center Patient Has Alerts: No since last visit: Pain Present Now: No Electronic Signature(s) Signed: 11/17/2022 3:55:47 PM By: Demetria Pore Entered By: Demetria Pore on 11/17/2022 14:57:23 -------------------------------------------------------------------------------- Encounter Discharge Information Details Patient Name: Date of Service: [name] 11/17/2022 1:30 PM Medical Record Number: [id number] Patient Account Number: [id number] Date of Birth/Sex: Treating RN: 29-Mar-1945 (77 y.o. M) Primary Care Kristina Mcnorton: Marcelino Duster Other Clinician: Referring Ulah Olmo: Treating Nelson Julson/Extender: Joylene Grapes in Treatment: 4 Encounter Discharge Information Items Discharge Condition: Stable Ambulatory Status: Ambulatory Discharge Destination: Home Transportation: Private Auto Schedule  Follow-up Appointment: Yes Clinical Summary of Care: Electronic Signature(s) Signed: 11/17/2022 3:55:47 PM By: Gena Fray, Signed: 11/17/2022 3:55:47 PM By: Cornell Barman ([id number]) [id number] Page 2 of 2 Entered By: Demetria Pore on 11/17/2022 15:55:28 -------------------------------------------------------------------------------- Vitals Details Patient Name: Date of Service: CLEARNCE, LEJA 11/17/2022 1:30 PM Medical Record Number: [id number] Patient Account Number: [id number] Date of Birth/Sex: Treating RN: October 21, 1945 (77 y.o. M) Primary Care Natasia Sanko: Marcelino Duster Other Clinician: Referring Di Jasmer: Treating Daronte Shostak/Extender: Joylene Grapes in Treatment: 4 Vital Signs Time Taken: 01:00 Temperature (F): 98.0 Height (in): 65 Pulse (bpm): 70 Weight (lbs): 163 Respiratory Rate (breaths/min): 16 Body Mass Index (BMI): 27.1 Blood Pressure (mmHg): 102/68 Reference Range: 80 - 120 mg / dl Airway Pulse Oximetry (%): 98 Electronic Signature(s) Signed: 11/17/2022 3:55:47 PM By: Demetria Pore Entered By: Demetria Pore on 11/17/2022 14:57:27

## 2022-11-17 NOTE — Progress Notes (Signed)
NARAYAN, SCULL ([id number]) [id number] Page 1 of 2 Visit Report for 11/17/2022 Problem List Details Patient Name: Date of Service: [name], [name] 11/17/2022 1:30 PM Medical Record Number: [id number] Patient Account Number: [id number] Date of Birth/Sex: Treating RN: [birth year] (77 y.o. 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 I25.5 Ischemic cardiomyopathy Facility Procedures : 4 CPT4 Code: 1300002 Description: (Facility Use Only) HBOT full body chamber, , Modifier: Quantity: 4 Physician Procedures : 9 CPT4: Description Modifier  Code [phone] Physician attendance and supervision of hyperbaric oxygen therapy, per sessionsupervision of hyperbaric Oxygen therapy, per session ICD-10 Diagnosis Description N30.41 Irradiation cystitis with hematuria Z85.51  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

## 2022-11-18 ENCOUNTER — Encounter: Payer: Medicare Other | Attending: Physician Assistant | Admitting: Physician Assistant

## 2022-11-18 DIAGNOSIS — N3041 Irradiation cystitis with hematuria: Secondary | ICD-10-CM | POA: Insufficient documentation

## 2022-11-18 DIAGNOSIS — Z8546 Personal history of malignant neoplasm of prostate: Secondary | ICD-10-CM | POA: Insufficient documentation

## 2022-11-18 DIAGNOSIS — Z8551 Personal history of malignant neoplasm of bladder: Secondary | ICD-10-CM | POA: Diagnosis not present

## 2022-11-18 DIAGNOSIS — I255 Ischemic cardiomyopathy: Secondary | ICD-10-CM | POA: Diagnosis not present

## 2022-11-18 NOTE — Progress Notes (Signed)
LYNN, WAGAMAN ([id number]) [id number] Page 1 of 2 Visit Report for 11/18/2022 HBO Details Patient Name: Date of Service: [name], [name] 11/18/2022 1:30 PM Medical Record Number: [id number] Patient Account Number: [id number] Date of Birth/Sex: Treating RN: 04/21/1945 (77 y.o. M) Primary Care Hyman Crossan: Marcelino Duster Other Clinician: Referring Verlin Duke: Treating Lemar Bakos/Extender: Joylene Grapes in Treatment: 4 HBO Treatment Course Details Treatment Course Number: 1 Ordering Cylinda Santoli: Adrian Blackwater Treatments Ordered: otal 40 HBO Treatment Start Date: 11/17/2022 HBO Indication: Other (specify in Notes) Notes: Irradiation cystitis with hematuria HBO Treatment Details Treatment Number: 2 Patient Type: Outpatient Chamber Type: Monoplace Chamber Serial #: [id number] Treatment Protocol: 2.0 ATA with 90 minutes oxygen, and no air breaks Treatment Details Compression Rate Down: 2.0 psi / minute De-Compression Rate Up: 1.5 psi / minute Air breaks and breathing Decompress Decompress Compress Tx Pressure Begins Reached periods Begins Ends (leave unused spaces blank) Chamber Pressure (ATA 1 2 ------2 1 ) Clock Time (24 hr) 13:38 13:50 - - - - - - 15:20 15:30 Treatment Length: 112 (minutes) Treatment Segments: 4 Vital Signs Capillary Blood Glucose Reference Range: 80 - 120 mg / dl HBO Diabetic Blood Glucose Intervention Range: <131 mg/dl or >782 mg/dl Time Vitals Blood Respiratory Capillary Blood Glucose Pulse Action Type: Pulse: Temperature: Taken: Pressure: Rate: Glucose (mg/dl): Meter #: Oximetry (%) Taken: Pre 01:30 118/80 70 16 98 98 Post 15:30 124/80 88 16 98 Treatment Response Treatment Toleration: Well Treatment Completion Status: Treatment Completed without Adverse Event Electronic Signature(s) Signed: 11/18/2022 3:39:07 PM By: Demetria Pore Signed: 11/18/2022 4:56:48 PM By: Allen Derry PA-C Entered By: Demetria Pore on  11/18/2022 12:38:30 [name] ([id number]) [id number] Page 2 of 2 -------------------------------------------------------------------------------- HBO Safety Checklist Details Patient Name: Date of Service: [name], [name] 11/18/2022 1:30 PM Medical Record Number: [id number] Patient Account Number: [id number] Date of Birth/Sex: Treating RN: 10/14/1945 (77 y.o. M) Primary Care Kalyiah Saintil: Marcelino Duster Other Clinician: Referring Tanijah Morais: Treating Tanice Petre/Extender: Joylene Grapes in Treatment: 4 HBO Safety Checklist Items Safety Checklist Consent Form Signed Patient voided / foley secured and emptied When did you last eato 11/18/22 Last dose of injectable or oral agent 11/18/22 Ostomy pouch emptied and vented if applicable NA All implantable devices assessed, documented and approved NA Intravenous access site secured and place NA Valuables secured Linens and cotton and cotton/polyester blend (less than 51% polyester) Personal oil-based products / skin lotions / body lotions removed Wigs or hairpieces removed NA Smoking or tobacco materials removed NA Books / newspapers / magazines / loose paper removed Cologne, aftershave, perfume and deodorant removed Jewelry removed (may wrap wedding band) Make-up removed NA Hair care products removed Battery operated devices (external) removed NA Heating patches and chemical warmers removed NA Titanium eyewear removed NA Nail polish cured greater than 10 hours NA Casting material cured greater than 10 hours NA Hearing aids removed NA Loose dentures or partials removed NA Prosthetics have been removed NA Patient demonstrates correct use of air break device (if applicable) Patient concerns have been addressed Patient grounding bracelet on and cord attached to chamber Specifics for Inpatients (complete in addition to above) Medication sheet sent with patient NA Intravenous  medications needed or due during therapy sent with patient NA Drainage tubes (e.g. nasogastric tube or chest tube secured and vented) NA Endotracheal or Tracheotomy tube secured NA Cuff deflated of air and inflated with saline NA Airway suctioned NA Electronic Signature(s) Signed: 11/18/2022 3:39:07 PM By: Demetria Pore Entered By:  Demetria Pore on 11/18/2022 12:08:16

## 2022-11-18 NOTE — Progress Notes (Signed)
ERMIAS, TOMEO ([id number]) [phone].pdf Page 1 of 2 Visit Report for 11/18/2022 Arrival Information Details Patient Name: Date of Service: [name], [name] 11/18/2022 1:30 PM Medical Record Number: [id number] Patient Account Number: [id number] Date of Birth/Sex: Treating RN: 1945/02/19 (77 y.o. M) Primary Care Isma Tietje: Marcelino Duster Other Clinician: Referring Jaiven Graveline: Treating Ignatius Kloos/Extender: Joylene Grapes in Treatment: 4 Visit Information History Since Last Visit Added or deleted any medications: No Patient Arrived: Ambulatory Any new allergies or adverse reactions: No Arrival Time: 13:30 Had a fall or experienced change in No Accompanied By: self activities of daily living that may affect Transfer Assistance: None risk of falls: Patient Identification Verified: Yes Signs or symptoms of abuse/neglect since last visito No Secondary Verification Process Completed: Yes Hospitalized since last visit: No Patient Requires Transmission-Based Precautions: No Implantable device outside of the clinic excluding No Patient Has Alerts: No cellular tissue based products placed in the center since last visit: Pain Present Now: No Electronic Signature(s) Signed: 11/18/2022 3:39:07 PM By: Demetria Pore Entered By: Demetria Pore on 11/18/2022 15:08:08 -------------------------------------------------------------------------------- Vitals Details Patient Name: Date of Service: [name]. 11/18/2022 1:30 PM Medical Record Number: [id number] Patient Account Number: [id number] Date of Birth/Sex: Treating RN: Feb 01, 1946 (77 y.o. M) Primary Care Nuh Lipton: Marcelino Duster Other Clinician: Referring Skarlet Lyons: Treating Tynesha Free/Extender: Joylene Grapes in Treatment: 4 Vital Signs Time Taken: 01:30 Temperature (F): 98.0 Height (in): 65 Pulse (bpm): 70 Weight (lbs): 163 Respiratory Rate (breaths/min): 16 Body Mass Index  (BMI): 27.1 Blood Pressure (mmHg): 118/80 Reference Range: 80 - 120 mg / dl Airway Pulse Oximetry (%): 98 [name], [name] ([id number]) [phone].pdf Page 2 of 2 Electronic Signature(s) Signed: 11/18/2022 3:39:07 PM By: Demetria Pore Entered By: Demetria Pore on 11/18/2022 15:08:12

## 2022-11-19 ENCOUNTER — Encounter: Payer: Medicare Other | Admitting: Internal Medicine

## 2022-11-20 ENCOUNTER — Encounter: Payer: Medicare Other | Admitting: Physician Assistant

## 2022-11-20 DIAGNOSIS — N3041 Irradiation cystitis with hematuria: Secondary | ICD-10-CM | POA: Diagnosis not present

## 2022-11-20 NOTE — Progress Notes (Signed)
KOHAN, AZIZI ([id number]) [id number] Page 1 of 2 Visit Report for 11/20/2022 Arrival Information Details Patient Name: Date of Service: [name] 11/20/2022 1:30 PM Medical Record Number: [id number] Patient Account Number: [id number] Date of Birth/Sex: Treating RN: 1946/02/14 (77 y.o. M) Primary Care Juana Haralson: Marcelino Duster Other Clinician: Referring Emaya Preston: Treating Teoman Giraud/Extender: Joylene Grapes in Treatment: 5 Visit Information History Since Last Visit Added or deleted any medications: No Patient Arrived: Ambulatory Any new allergies or adverse reactions: No Arrival Time: 14:34 Had a fall or experienced change in No Accompanied By: self activities of daily living that may affect Transfer Assistance: None risk of falls: Patient Identification Verified: Yes Signs or symptoms of abuse/neglect since last visito No Secondary Verification Process Completed: Yes Hospitalized since last visit: No Patient Requires Transmission-Based Precautions: No Implantable device outside of the clinic excluding No Patient Has Alerts: No cellular tissue based products placed in the center since last visit: Pain Present Now: No Electronic Signature(s) Signed: 11/20/2022 3:47:29 PM By: Demetria Pore Entered By: Demetria Pore on 11/20/2022 12:07:10 -------------------------------------------------------------------------------- Vitals Details Patient Name: Date of Service: [name]. 11/20/2022 1:30 PM Medical Record Number: [id number] Patient Account Number: [id number] Date of Birth/Sex: Treating RN: 07-Jun-1945 (77 y.o. M) Primary Care Paizlie Klaus: Marcelino Duster Other Clinician: Referring Jacek Colson: Treating Micaiah Litle/Extender: Joylene Grapes in Treatment: 5 Vital Signs Time Taken: 01:30 Temperature (F): 98.0 Height (in): 65 Pulse (bpm): 93 Weight (lbs): 163 Respiratory Rate (breaths/min): 16 Body Mass Index  (BMI): 27.1 Blood Pressure (mmHg): 110/78 Reference Range: 80 - 120 mg / dl Airway Pulse Oximetry (%): 98 [name], [name] ([id number]) [id number] Page 2 of 2 Electronic Signature(s) Signed: 11/20/2022 3:47:29 PM By: Demetria Pore Entered By: Demetria Pore on 11/20/2022 12:10:07

## 2022-11-21 ENCOUNTER — Encounter: Payer: Medicare Other | Admitting: Physician Assistant

## 2022-11-21 NOTE — Progress Notes (Signed)
JAMARQUES, ERICH ([id number]) [id number] Page 1 of 2 Visit Report for 11/20/2022 HBO Details Patient Name: Date of Service: [name], [name] 11/20/2022 1:30 PM Medical Record Number: [id number] Patient Account Number: [id number] Date of Birth/Sex: Treating RN: [birth year] (77 y.o. M) Primary Care Raetta Agostinelli: Marcelino Duster Other Clinician: Referring Makayah Pauli: Treating Kendrix Orman/Extender: Joylene Grapes in Treatment: 5 HBO Treatment Course Details Treatment Course Number: 1 Ordering Genevie Elman: Adrian Blackwater Treatments Ordered: otal 40 HBO Treatment Start Date: 11/17/2022 HBO Indication: Other (specify in Notes) Notes: Irradiation cystitis with hematuria HBO Treatment Details Treatment Number: 3 Patient Type: Outpatient Chamber Type: Monoplace Chamber Serial #: [id number] Treatment Protocol: 2.0 ATA with 90 minutes oxygen, and no air breaks Treatment Details Compression Rate Down: 2.0 psi / minute De-Compression Rate Up: 1.5 psi / minute Air breaks and breathing Decompress Decompress Compress Tx Pressure Begins Reached periods Begins Ends (leave unused spaces blank) Chamber Pressure (ATA 1 2 ------2 1 ) Clock Time (24 hr) 13:44 13:55 - - - - - - 15:26 15:36 Treatment Length: 112 (minutes) Treatment Segments: 4 Vital Signs Capillary Blood Glucose Reference Range: 80 - 120 mg / dl HBO Diabetic Blood Glucose Intervention Range: <131 mg/dl or >782 mg/dl Time Vitals Blood Respiratory Capillary Blood Glucose Pulse Action Type: Pulse: Temperature: Taken: Pressure: Rate: Glucose (mg/dl): Meter #: Oximetry (%) Taken: Pre 01:30 110/78 93 16 98 98 Post 15:36 118/80 89 16 98 99 Treatment Response Treatment Toleration: Well Treatment Completion Status: Treatment Completed without Adverse Event Electronic Signature(s) Signed: 11/20/2022 3:47:29 PM By: Demetria Pore Signed: 11/21/2022 2:08:40 PM By: Allen Derry PA-C Entered By: Demetria Pore  on 11/20/2022 15:47:00 [name] ([id number]) [id number] Page 2 of 2 -------------------------------------------------------------------------------- HBO Safety Checklist Details Patient Name: Date of Service: [name], [name] 11/20/2022 1:30 PM Medical Record Number: [id number] Patient Account Number: [id number] Date of Birth/Sex: Treating RN: [birth year] (77 y.o. M) Primary Care Darshay Deupree: Marcelino Duster Other Clinician: Referring Ellia Knowlton: Treating Lorean Ekstrand/Extender: Joylene Grapes in Treatment: 5 HBO Safety Checklist Items Safety Checklist Consent Form Signed Patient voided / foley secured and emptied When did you last eato 11/20/2022 Last dose of injectable or oral agent 11/20/2022 Ostomy pouch emptied and vented if applicable NA All implantable devices assessed, documented and approved NA Intravenous access site secured and place NA Valuables secured Linens and cotton and cotton/polyester blend (less than 51% polyester) Personal oil-based products / skin lotions / body lotions removed Wigs or hairpieces removed NA Smoking or tobacco materials removed NA Books / newspapers / magazines / loose paper removed Cologne, aftershave, perfume and deodorant removed Jewelry removed (may wrap wedding band) Make-up removed NA Hair care products removed Battery operated devices (external) removed NA Heating patches and chemical warmers removed NA Titanium eyewear removed NA Nail polish cured greater than 10 hours NA Casting material cured greater than 10 hours NA Hearing aids removed NA Loose dentures or partials removed NA Prosthetics have been removed NA Patient demonstrates correct use of air break device (if applicable) Patient concerns have been addressed Patient grounding bracelet on and cord attached to chamber Specifics for Inpatients (complete in addition to above) Medication sheet sent with  patient NA Intravenous medications needed or due during therapy sent with patient NA Drainage tubes (e.g. nasogastric tube or chest tube secured and vented) NA Endotracheal or Tracheotomy tube secured NA Cuff deflated of air and inflated with saline NA Airway suctioned NA Electronic Signature(s) Signed: 11/20/2022 3:47:29 PM By: Francoise Ceo  By: Demetria Pore on 11/20/2022 15:34:40

## 2022-11-24 ENCOUNTER — Encounter: Payer: Medicare Other | Admitting: Physician Assistant

## 2022-11-24 DIAGNOSIS — N3041 Irradiation cystitis with hematuria: Secondary | ICD-10-CM | POA: Diagnosis not present

## 2022-11-25 ENCOUNTER — Encounter: Payer: Medicare Other | Admitting: Physician Assistant

## 2022-11-25 DIAGNOSIS — N3041 Irradiation cystitis with hematuria: Secondary | ICD-10-CM | POA: Diagnosis not present

## 2022-11-25 NOTE — Progress Notes (Signed)
UWE, KOIS ([id number]) [id number] Page 1 of 2 Visit Report for 11/25/2022 HBO Details Patient Name: Date of Service: [name], [name] 11/25/2022 1:30 PM Medical Record Number: [id number] Patient Account Number: [id number] Date of Birth/Sex: Treating RN: 29-Oct-1945 (77 y.o. M) Primary Care Draeden Kellman: Marcelino Duster Other Clinician: Referring Tymar Polyak: Treating Saadiya Wilfong/Extender: Joylene Grapes in Treatment: 5 HBO Treatment Course Details Treatment Course Number: 1 Ordering Zabian Swayne: Adrian Blackwater Treatments Ordered: otal 40 HBO Treatment Start Date: 11/17/2022 HBO Indication: Other (specify in Notes) Notes: Irradiation cystitis with hematuria HBO Treatment Details Treatment Number: 5 Patient Type: Outpatient Chamber Type: Monoplace Chamber Serial #: [id number] Treatment Protocol: 2.0 ATA with 90 minutes oxygen, and no air breaks Treatment Details Compression Rate Down: 2.0 psi / minute De-Compression Rate Up: 1.5 psi / minute Air breaks and breathing Decompress Decompress Compress Tx Pressure Begins Reached periods Begins Ends (leave unused spaces blank) Chamber Pressure (ATA 1 2 ------2 1 ) Clock Time (24 hr) 13:42 13:52 - - - - - - 15:22 15:32 Treatment Length: 110 (minutes) Treatment Segments: 4 Vital Signs Capillary Blood Glucose Reference Range: 80 - 120 mg / dl HBO Diabetic Blood Glucose Intervention Range: <131 mg/dl or >272 mg/dl Time Vitals Blood Respiratory Capillary Blood Glucose Pulse Action Type: Pulse: Temperature: Taken: Pressure: Rate: Glucose (mg/dl): Meter #: Oximetry (%) Taken: Pre 01:30 118/80 60 16 98 Post 15:32 120/68 62 16 97.8 Treatment Response Treatment Completion Status: Treatment Completed without Adverse Event Electronic Signature(s) Signed: 11/25/2022 3:58:02 PM By: Demetria Pore Signed: 11/25/2022 4:49:45 PM By: Allen Derry PA-C Entered By: Demetria Pore on 11/25/2022 15:57:27 [name] ([id number]) [id number] Page 2 of 2 -------------------------------------------------------------------------------- HBO Safety Checklist Details Patient Name: Date of Service: [name], [name] 11/25/2022 1:30 PM Medical Record Number: [id number] Patient Account Number: [id number] Date of Birth/Sex: Treating RN: March 27, 1945 (77 y.o. M) Primary Care Salmaan Patchin: Marcelino Duster Other Clinician: Referring Rashunda Passon: Treating Perrin Eddleman/Extender: Joylene Grapes in Treatment: 5 HBO Safety Checklist Items Safety Checklist Consent Form Signed Patient voided / foley secured and emptied When did you last eato 11/25/22 Last dose of injectable or oral agent 11/25/22 Ostomy pouch emptied and vented if applicable NA All implantable devices assessed, documented and approved NA Intravenous access site secured and place NA Valuables secured Linens and cotton and cotton/polyester blend (less than 51% polyester) Personal oil-based products / skin lotions / body lotions removed Wigs or hairpieces removed NA Smoking or tobacco materials removed NA Books / newspapers / magazines / loose paper removed Cologne, aftershave, perfume and deodorant removed Jewelry removed (may wrap wedding band) Make-up removed NA Hair care products removed NA Battery operated devices (external) removed NA Heating patches and chemical warmers removed NA Titanium eyewear removed NA Nail polish cured greater than 10 hours NA Casting material cured greater than 10 hours NA Hearing aids removed NA Loose dentures or partials removed NA Prosthetics have been removed NA Patient demonstrates correct use of air break device (if applicable) Patient concerns have been addressed Patient grounding bracelet on and cord attached to chamber Specifics for Inpatients (complete in addition to above) Medication sheet sent with patient NA Intravenous medications needed or due  during therapy sent with patient NA Drainage tubes (e.g. nasogastric tube or chest tube secured and vented) NA Endotracheal or Tracheotomy tube secured NA Cuff deflated of air and inflated with saline NA Airway suctioned NA Electronic Signature(s) Signed: 11/25/2022 3:58:02 PM By: Demetria Pore Entered By: Demetria Pore on  11/25/2022 15:57:11

## 2022-11-25 NOTE — Progress Notes (Signed)
ORESTE, MAJEED ([id number]) [id number] Page 1 of 2 Visit Report for 11/24/2022 Arrival Information Details Patient Name: Date of Service: [name], [name] 11/24/2022 12:30 PM Medical Record Number: [id number] Patient Account Number: [id number] Date of Birth/Sex: Treating RN: Jun 28, 1945 (77 y.o. M) Primary Care Zurie Platas: Marcelino Duster Other Clinician: Referring Philamena Kramar: Treating Joslyne Marshburn/Extender: Joylene Grapes in Treatment: 5 Visit Information History Since Last Visit Added or deleted any medications: No Patient Arrived: Ambulatory Any new allergies or adverse reactions: No Arrival Time: 12:35 Had a fall or experienced change in No Accompanied By: self activities of daily living that may affect Transfer Assistance: None risk of falls: Patient Identification Verified: Yes Signs or symptoms of abuse/neglect since last visito No Secondary Verification Process Completed: Yes Hospitalized since last visit: No Patient Requires Transmission-Based Precautions: No Implantable device outside of the clinic excluding No Patient Has Alerts: No cellular tissue based products placed in the center since last visit: Pain Present Now: No Electronic Signature(s) Signed: 11/25/2022 3:58:02 PM By: Demetria Pore Entered By: Demetria Pore on 11/24/2022 14:35:48 -------------------------------------------------------------------------------- Encounter Discharge Information Details Patient Name: Date of Service: [name] 11/24/2022 12:30 PM Medical Record Number: [id number] Patient Account Number: [id number] Date of Birth/Sex: Treating RN: 02/12/1946 (77 y.o. M) Primary Care Latondra Gebhart: Marcelino Duster Other Clinician: Referring Rolla Servidio: Treating Charmelle Soh/Extender: Joylene Grapes in Treatment: 5 Encounter Discharge Information Items Discharge Condition: Stable Ambulatory Status: Ambulatory Discharge Destination:  Home Transportation: Private Auto Accompanied By: self Schedule Follow-up Appointment: Yes Clinical Summary of Care: [name], [name] ([id number]) [id number] Page 2 of 2 Electronic Signature(s) Signed: 11/25/2022 3:58:02 PM By: Demetria Pore Entered By: Demetria Pore on 11/24/2022 15:04:13 -------------------------------------------------------------------------------- Vitals Details Patient Name: Date of Service: [name] 11/24/2022 12:30 PM Medical Record Number: [id number] Patient Account Number: [id number] Date of Birth/Sex: Treating RN: Jun 29, 1945 (77 y.o. M) Primary Care Jacquelynne Guedes: Marcelino Duster Other Clinician: Referring Lochlan Grygiel: Treating Clairessa Boulet/Extender: Joylene Grapes in Treatment: 5 Vital Signs Time Taken: 12:35 Temperature (F): 98.0 Height (in): 65 Pulse (bpm): 70 Weight (lbs): 163 Respiratory Rate (breaths/min): 16 Body Mass Index (BMI): 27.1 Blood Pressure (mmHg): 124/80 Reference Range: 80 - 120 mg / dl Airway Pulse Oximetry (%): 98 Electronic Signature(s) Signed: 11/25/2022 3:58:02 PM By: Demetria Pore Entered By: Demetria Pore on 11/24/2022 14:35:50

## 2022-11-25 NOTE — Progress Notes (Signed)
[name], [name] ([id number]) [phone].pdf Page 1 of 2 Visit Report for 11/25/2022 Arrival Information Details Patient Name: Date of Service: [name], [name] 11/25/2022 1:30 PM Medical Record Number: [id number] Patient Account Number: [id number] Date of Birth/Sex: Treating RN: 05-24-1945 (77 y.o. M) Primary Care Hashem Goynes: Marcelino Duster Other Clinician: Referring Polette Nofsinger: Treating Rolla Servidio/Extender: Joylene Grapes in Treatment: 5 Visit Information History Since Last Visit Added or deleted any medications: No Patient Arrived: Ambulatory Any new allergies or adverse reactions: No Arrival Time: 13:47 Had a fall or experienced change in No Accompanied By: self activities of daily living that may affect Transfer Assistance: None risk of falls: Patient Identification Verified: Yes Signs or symptoms of abuse/neglect since last visito No Secondary Verification Process Completed: Yes Hospitalized since last visit: No Patient Requires Transmission-Based Precautions: No Implantable device outside of the clinic excluding No Patient Has Alerts: No cellular tissue based products placed in the center since last visit: Pain Present Now: No Electronic Signature(s) Signed: 11/25/2022 3:58:02 PM By: Demetria Pore Entered By: Demetria Pore on 11/25/2022 12:57:05 -------------------------------------------------------------------------------- Encounter Discharge Information Details Patient Name: Date of Service: [name] 11/25/2022 1:30 PM Medical Record Number: [id number] Patient Account Number: [id number] Date of Birth/Sex: Treating RN: [birth year] (77 y.o. M) Primary Care Yaslyn Cumby: Marcelino Duster Other Clinician: Referring Rubina Basinski: Treating Walker Sitar/Extender: Joylene Grapes in Treatment: 5 Encounter Discharge Information Items Discharge Condition: Stable Ambulatory Status: Ambulatory Discharge Destination:  Home Transportation: Private Auto Accompanied By: self Schedule Follow-up Appointment: Yes Clinical Summary of Care: [name] ([id number]) [phone].pdf Page 2 of 2 Electronic Signature(s) Signed: 11/25/2022 3:58:02 PM By: Demetria Pore Entered By: Demetria Pore on 11/25/2022 12:57:38 -------------------------------------------------------------------------------- Vitals Details Patient Name: Date of Service: [name] 11/25/2022 1:30 PM Medical Record Number: [id number] Patient Account Number: [id number] Date of Birth/Sex: Treating RN: [birth year] (77 y.o. M) Primary Care Melessa Cowell: Marcelino Duster Other Clinician: Referring Latifah Padin: Treating Westley Blass/Extender: Joylene Grapes in Treatment: 5 Vital Signs Time Taken: 01:30 Temperature (F): 98.0 Height (in): 65 Pulse (bpm): 60 Weight (lbs): 163 Respiratory Rate (breaths/min): 16 Body Mass Index (BMI): 27.1 Blood Pressure (mmHg): 118/80 Reference Range: 80 - 120 mg / dl Electronic Signature(s) Signed: 11/25/2022 3:58:02 PM By: Demetria Pore Entered By: Demetria Pore on 11/25/2022 12:57:07

## 2022-11-25 NOTE — Progress Notes (Signed)
BENEN, LASO ([id number]) [id number] Page 1 of 2 Visit Report for 11/24/2022 HBO Details Patient Name: Date of Service: [name], [name] 11/24/2022 12:30 PM Medical Record Number: [id number] Patient Account Number: [id number] Date of Birth/Sex: Treating RN: [birth year] (77 y.o. M) Primary Care Marlisa Caridi: Marcelino Duster Other Clinician: Referring Jennae Hakeem: Treating Winda Summerall/Extender: Joylene Grapes in Treatment: 5 HBO Treatment Course Details Treatment Course Number: 1 Ordering Adele Milson: Adrian Blackwater Treatments Ordered: otal 40 HBO Treatment Start Date: 11/17/2022 HBO Indication: Other (specify in Notes) Notes: Irradiation cystitis with hematuria HBO Treatment Details Treatment Number: 4 Patient Type: Outpatient Chamber Type: Monoplace Chamber Serial #: [id number] Treatment Protocol: 2.0 ATA with 90 minutes oxygen, and no air breaks Treatment Details Compression Rate Down: 2.0 psi / minute De-Compression Rate Up: 1.5 psi / minute Air breaks and breathing Decompress Decompress Compress Tx Pressure Begins Reached periods Begins Ends (leave unused spaces blank) Chamber Pressure (ATA 1 2 ------2 1 ) Clock Time (24 hr) 12:48 13:00 - - - - - - 14:31 14:40 Treatment Length: 112 (minutes) Treatment Segments: 4 Vital Signs Capillary Blood Glucose Reference Range: 80 - 120 mg / dl HBO Diabetic Blood Glucose Intervention Range: <131 mg/dl or >782 mg/dl Time Vitals Blood Respiratory Capillary Blood Glucose Pulse Action Type: Pulse: Temperature: Taken: Pressure: Rate: Glucose (mg/dl): Meter #: Oximetry (%) Taken: Pre 12:35 124/80 70 16 98 98 Post 14:40 148/70 68 16 97.6 Treatment Response Treatment Toleration: Well Treatment Completion Status: Treatment Completed without Adverse Event Electronic Signature(s) Signed: 11/24/2022 3:41:41 PM By: Allen Derry PA-C Signed: 11/25/2022 3:58:02 PM By: Demetria Pore Entered By: Demetria Pore  on 11/24/2022 15:03:47 [name] ([id number]) [id number] Page 2 of 2 -------------------------------------------------------------------------------- HBO Safety Checklist Details Patient Name: Date of Service: [name], [name] 11/24/2022 12:30 PM Medical Record Number: [id number] Patient Account Number: [id number] Date of Birth/Sex: Treating RN: [birth year] (77 y.o. M) Primary Care Chardai Gangemi: Marcelino Duster Other Clinician: Referring Aritha Huckeba: Treating Elayne Gruver/Extender: Joylene Grapes in Treatment: 5 HBO Safety Checklist Items Safety Checklist Consent Form Signed Patient voided / foley secured and emptied When did you last eato 11/24/22 Last dose of injectable or oral agent 11/24/22 Ostomy pouch emptied and vented if applicable NA All implantable devices assessed, documented and approved NA Intravenous access site secured and place NA Valuables secured Linens and cotton and cotton/polyester blend (less than 51% polyester) Personal oil-based products / skin lotions / body lotions removed Wigs or hairpieces removed NA Smoking or tobacco materials removed NA Books / newspapers / magazines / loose paper removed Cologne, aftershave, perfume and deodorant removed Jewelry removed (may wrap wedding band) Make-up removed Hair care products removed Battery operated devices (external) removed NA Heating patches and chemical warmers removed NA Titanium eyewear removed NA Nail polish cured greater than 10 hours NA Casting material cured greater than 10 hours NA Hearing aids removed NA Loose dentures or partials removed NA Prosthetics have been removed NA Patient demonstrates correct use of air break device (if applicable) Patient concerns have been addressed Patient grounding bracelet on and cord attached to chamber Specifics for Inpatients (complete in addition to above) Medication sheet sent with patient NA Intravenous  medications needed or due during therapy sent with patient NA Drainage tubes (e.g. nasogastric tube or chest tube secured and vented) NA Endotracheal or Tracheotomy tube secured NA Cuff deflated of air and inflated with saline NA Airway suctioned NA Electronic Signature(s) Signed: 11/25/2022 3:58:02 PM By: Demetria Pore Entered By: Lynnell Jude  Kim on 11/24/2022 14:36:53

## 2022-11-26 ENCOUNTER — Encounter: Payer: Medicare Other | Admitting: Internal Medicine

## 2022-11-26 DIAGNOSIS — N3041 Irradiation cystitis with hematuria: Secondary | ICD-10-CM | POA: Diagnosis not present

## 2022-11-26 NOTE — Progress Notes (Signed)
[name] ([id number]) [id number] Page 1 of 2 Visit Report for 11/26/2022 Arrival Information Details Patient Name: Date of Service: [name], [name] 11/26/2022 1:30 PM Medical Record Number: [id number] Patient Account Number: [id number] Date of Birth/Sex: Treating RN: [birth year] (77 y.o. M) Primary Care Khanh Cordner: Marcelino Duster Other Clinician: Referring Dena Esperanza: Treating Ronelle Smallman/Extender: RO BSO N, MICHA EL Chauncy Passy in Treatment: 5 Visit Information History Since Last Visit Added or deleted any medications: No Patient Arrived: Ambulatory Any new allergies or adverse reactions: No Arrival Time: 13:30 Had a fall or experienced change in No Accompanied By: self activities of daily living that may affect Transfer Assistance: None risk of falls: Patient Identification Verified: Yes Signs or symptoms of abuse/neglect since last visito No Secondary Verification Process Completed: Yes Hospitalized since last visit: No Patient Requires Transmission-Based Precautions: No Implantable device outside of the clinic excluding No Patient Has Alerts: No cellular tissue based products placed in the center since last visit: Pain Present Now: No Electronic Signature(s) Signed: 11/26/2022 3:48:00 PM By: Demetria Pore Entered By: Demetria Pore on 11/26/2022 11:22:08 -------------------------------------------------------------------------------- Encounter Discharge Information Details Patient Name: Date of Service: [name] 11/26/2022 1:30 PM Medical Record Number: [id number] Patient Account Number: [id number] Date of Birth/Sex: Treating RN: [birth year] (77 y.o. M) Primary Care Onisha Cedeno: Marcelino Duster Other Clinician: Referring Azalynn Maxim: Treating Letisia Schwalb/Extender: RO BSO N, MICHA EL Chauncy Passy in Treatment: 5 Encounter Discharge Information Items Discharge Condition: Stable Ambulatory Status: Ambulatory Discharge  Destination: Home Transportation: Private Auto Accompanied By: self Schedule Follow-up Appointment: Yes Clinical Summary of Care: TYSHAN, ENDERLE ([id number]) [id number] Page 2 of 2 Electronic Signature(s) Signed: 11/26/2022 3:48:00 PM By: Demetria Pore Entered By: Demetria Pore on 11/26/2022 12:46:51 -------------------------------------------------------------------------------- Vitals Details Patient Name: Date of Service: [name] 11/26/2022 1:30 PM Medical Record Number: [id number] Patient Account Number: [id number] Date of Birth/Sex: Treating RN: April 06, 1945 (77 y.o. M) Primary Care Nastasia Kage: Marcelino Duster Other Clinician: Referring Sailor Haughn: Treating Aricka Goldberger/Extender: RO BSO N, MICHA EL Elveria Rising, John Weeks in Treatment: 5 Vital Signs Time Taken: 01:30 Temperature (F): 98.0 Height (in): 65 Pulse (bpm): 84 Weight (lbs): 163 Respiratory Rate (breaths/min): 16 Body Mass Index (BMI): 27.1 Blood Pressure (mmHg): 120/64 Reference Range: 80 - 120 mg / dl Airway Pulse Oximetry (%): 98 Electronic Signature(s) Signed: 11/26/2022 3:48:00 PM By: Demetria Pore Entered By: Demetria Pore on 11/26/2022 10:58:44

## 2022-11-26 NOTE — Progress Notes (Signed)
Jake, Johns ([id number]) [id number] Page 1 of 3 Visit Report for 11/26/2022 HBO Details Patient Name: Date of Service: [name], [name] 11/26/2022 1:30 PM Medical Record Number: [id number] Patient Account Number: [id number] Date of Birth/Sex: Treating RN: [birth year] (77 y.o. M) Primary Care Onnika Siebel: Marcelino Duster Other Clinician: Referring Karole Oo: Treating Christoper Bushey/Extender: RO BSO N, MICHA EL Elveria Rising, John Weeks in Treatment: 5 HBO Treatment Course Details Treatment Course Number: 1 Ordering Lorae Roig: Adrian Blackwater Treatments Ordered: otal 40 HBO Treatment Start Date: 11/17/2022 HBO Indication: Other (specify in Notes) Notes: Irradiation cystitis with hematuria HBO Treatment Details Treatment Number: 6 Patient Type: Outpatient Chamber Type: Monoplace Chamber Serial #: [id number] Treatment Protocol: 2.0 ATA with 90 minutes oxygen, and no air breaks Treatment Details Compression Rate Down: 2.0 psi / minute De-Compression Rate Up: 1.5 psi / minute Air breaks and breathing Decompress Decompress Compress Tx Pressure Begins Reached periods Begins Ends (leave unused spaces blank) Chamber Pressure (ATA 1 2 ------2 1 ) Clock Time (24 hr) 13:39 13:50 - - - - - - 15:21 15:31 Treatment Length: 112 (minutes) Treatment Segments: 4 Vital Signs Capillary Blood Glucose Reference Range: 80 - 120 mg / dl HBO Diabetic Blood Glucose Intervention Range: <131 mg/dl or >956 mg/dl Time Vitals Blood Respiratory Capillary Blood Glucose Pulse Action Type: Pulse: Temperature: Taken: Pressure: Rate: Glucose (mg/dl): Meter #: Oximetry (%) Taken: Pre 01:30 120/64 84 16 98 98 Post 15:31 124/70 82 16 98 Treatment Response Treatment Toleration: Well Treatment Completion Status: Treatment Completed without Adverse Event Yanett Conkright Notes The patient did not have any problems in HBO treatment. After he came out he spoke to me about a sense of fecal urgency has  he gets out of the chamber. In discussion it seems clear that this is not an HBO effect and I explained the patient that this is not an HBO side effect. It seems like this is happening at different times of the day. It is not diarrhea small hard stools with gas. I have recommended a laxative, apparently his primary physician suggested Senokot I think that would be reasonable as well. HBO Attestation I certify that I supervised this HBO treatment in accordance with Medicare guidelines. A trained emergency response team is readily available per Yes hospital policies and procedures. Continue HBOT as ordered. TRAMARION, MCPHEETERS ([id number]) [id number] Page 2 of 3 Electronic Signature(s) Signed: 11/26/2022 4:50:17 PM By: Baltazar Najjar MD Previous Signature: 11/26/2022 3:48:00 PM Version By: Demetria Pore Entered By: Baltazar Najjar on 11/26/2022 16:46:52 -------------------------------------------------------------------------------- HBO Safety Checklist Details Patient Name: Date of Service: [name] 11/26/2022 1:30 PM Medical Record Number: [id number] Patient Account Number: [id number] Date of Birth/Sex: Treating RN: 1945/04/25 (77 y.o. M) Primary Care Siaosi Alter: Marcelino Duster Other Clinician: Referring Carnesha Maravilla: Treating Seanmichael Salmons/Extender: RO BSO N, MICHA EL Elveria Rising, John Weeks in Treatment: 5 HBO Safety Checklist Items Safety Checklist Consent Form Signed Patient voided / foley secured and emptied When did you last eato 11/26/2022 Last dose of injectable or oral agent 11/26/2022 Ostomy pouch emptied and vented if applicable NA All implantable devices assessed, documented and approved NA Intravenous access site secured and place NA Valuables secured Linens and cotton and cotton/polyester blend (less than 51% polyester) Personal oil-based products / skin lotions / body lotions removed Wigs or hairpieces removed Smoking or tobacco materials  removed NA Books / newspapers / magazines / loose paper removed Cologne, aftershave, perfume and deodorant removed Jewelry removed (may wrap wedding band) Make-up removed Hair care  products removed Battery operated devices (external) removed NA Heating patches and chemical warmers removed NA Titanium eyewear removed NA Nail polish cured greater than 10 hours NA Casting material cured greater than 10 hours NA Hearing aids removed NA Loose dentures or partials removed NA Prosthetics have been removed NA Patient demonstrates correct use of air break device (if applicable) Patient concerns have been addressed Patient grounding bracelet on and cord attached to chamber Specifics for Inpatients (complete in addition to above) Medication sheet sent with patient NA Intravenous medications needed or due during therapy sent with patient NA Drainage tubes (e.g. nasogastric tube or chest tube secured and vented) NA Endotracheal or Tracheotomy tube secured NA Cuff deflated of air and inflated with saline NA Airway suctioned NA Electronic Signature(s) Signed: 11/26/2022 3:48:00 PM By: Gena Fray, Signed: 11/26/2022 3:48:00 PM By: Cornell Barman ([id number]) [id number] Page 3 of 3 Entered By: Demetria Pore on 11/26/2022 13:59:44

## 2022-11-27 ENCOUNTER — Encounter: Payer: Medicare Other | Admitting: Physician Assistant

## 2022-11-27 DIAGNOSIS — N3041 Irradiation cystitis with hematuria: Secondary | ICD-10-CM | POA: Diagnosis not present

## 2022-11-27 NOTE — Progress Notes (Signed)
ELBIE, WICHERS ([id number]) [id number] Page 1 of 2 Visit Report for 11/27/2022 HBO Details Patient Name: Date of Service: [name], [name] 11/27/2022 1:30 PM Medical Record Number: [id number] Patient Account Number: [id number] Date of Birth/Sex: Treating RN: 10/19/1945 (77 y.o. M) Primary Care Yamir Carignan: Marcelino Duster Other Clinician: Referring Leyli Kevorkian: Treating Ima Hafner/Extender: Joylene Grapes in Treatment: 6 HBO Treatment Course Details Treatment Course Number: 1 Ordering Aireona Torelli: Adrian Blackwater Treatments Ordered: otal 40 HBO Treatment Start Date: 11/17/2022 HBO Indication: Other (specify in Notes) Notes: Irradiation cystitis with hematuria HBO Treatment Details Treatment Number: 7 Patient Type: Outpatient Chamber Type: Monoplace Chamber Serial #: [id number] Treatment Protocol: 2.0 ATA with 90 minutes oxygen, and no air breaks Treatment Details Compression Rate Down: 2.0 psi / minute De-Compression Rate Up: 1.5 psi / minute Air breaks and breathing Decompress Decompress Compress Tx Pressure Begins Reached periods Begins Ends (leave unused spaces blank) Chamber Pressure (ATA 1 2 ------2 1 ) Clock Time (24 hr) 13:25 13:37 - - - - - - 15:08 15:18 Treatment Length: 113 (minutes) Treatment Segments: 4 Vital Signs Capillary Blood Glucose Reference Range: 80 - 120 mg / dl HBO Diabetic Blood Glucose Intervention Range: <131 mg/dl or >086 mg/dl Time Vitals Blood Respiratory Capillary Blood Glucose Pulse Action Type: Pulse: Temperature: Taken: Pressure: Rate: Glucose (mg/dl): Meter #: Oximetry (%) Taken: Pre 01:15 110/68 100 16 98 98 Post 15:18 118/80 98 16 98 Treatment Response Treatment Toleration: Well Treatment Completion Status: Treatment Completed without Adverse Event Electronic Signature(s) Signed: 11/27/2022 3:41:22 PM By: Demetria Pore Signed: 11/27/2022 4:17:03 PM By: Allen Derry PA-C Entered By: Demetria Pore on 11/27/2022 12:31:12 [name] ([id number]) [id number] Page 2 of 2 -------------------------------------------------------------------------------- HBO Safety Checklist Details Patient Name: Date of Service: [name], [name] 11/27/2022 1:30 PM Medical Record Number: [id number] Patient Account Number: [id number] Date of Birth/Sex: Treating RN: [birth year] (77 y.o. M) Primary Care Ria Redcay: Marcelino Duster Other Clinician: Referring Annebelle Bostic: Treating Wes Lezotte/Extender: Joylene Grapes in Treatment: 6 HBO Safety Checklist Items Safety Checklist Consent Form Signed Patient voided / foley secured and emptied When did you last eato 11/27/2022 Last dose of injectable or oral agent 11/27/2022 Ostomy pouch emptied and vented if applicable NA All implantable devices assessed, documented and approved NA Intravenous access site secured and place NA Valuables secured Linens and cotton and cotton/polyester blend (less than 51% polyester) Personal oil-based products / skin lotions / body lotions removed Wigs or hairpieces removed NA Smoking or tobacco materials removed NA Books / newspapers / magazines / loose paper removed Cologne, aftershave, perfume and deodorant removed Jewelry removed (may wrap wedding band) Make-up removed NA Hair care products removed Battery operated devices (external) removed NA Heating patches and chemical warmers removed NA Titanium eyewear removed NA Nail polish cured greater than 10 hours NA Casting material cured greater than 10 hours NA Hearing aids removed NA Loose dentures or partials removed NA Prosthetics have been removed NA Patient demonstrates correct use of air break device (if applicable) Patient concerns have been addressed Patient grounding bracelet on and cord attached to chamber Specifics for Inpatients (complete in addition to above) Medication sheet sent with  patient NA Intravenous medications needed or due during therapy sent with patient NA Drainage tubes (e.g. nasogastric tube or chest tube secured and vented) NA Endotracheal or Tracheotomy tube secured NA Cuff deflated of air and inflated with saline NA Airway suctioned NA Electronic Signature(s) Signed: 11/27/2022 3:41:22 PM By: Demetria Pore Entered By:  Demetria Pore on 11/27/2022 12:17:37

## 2022-11-27 NOTE — Progress Notes (Signed)
BERNABE, DORCE ([id number]) [id number].pdf Page 1 of 2 Visit Report for 11/27/2022 Arrival Information Details Patient Name: Date of Service: [name], [name] 11/27/2022 1:30 PM Medical Record Number: [id number] Patient Account Number: [id number] Date of Birth/Sex: Treating RN: 08-13-1945 (77 y.o. M) Primary Care Ottilie Wigglesworth: Marcelino Duster Other Clinician: Referring Twila Rappa: Treating Christopher Hink/Extender: Joylene Grapes in Treatment: 6 Visit Information History Since Last Visit Added or deleted any medications: No Patient Arrived: Ambulatory Any new allergies or adverse reactions: No Arrival Time: 13:50 Had a fall or experienced change in No Accompanied By: self activities of daily living that may affect Transfer Assistance: None risk of falls: Patient Identification Verified: Yes Signs or symptoms of abuse/neglect since last visito No Secondary Verification Process Completed: Yes Hospitalized since last visit: No Patient Requires Transmission-Based Precautions: No Implantable device outside of the clinic excluding No Patient Has Alerts: No cellular tissue based products placed in the center since last visit: Pain Present Now: No Electronic Signature(s) Signed: 11/27/2022 3:41:22 PM By: Demetria Pore Entered By: Demetria Pore on 11/27/2022 12:17:31 -------------------------------------------------------------------------------- Encounter Discharge Information Details Patient Name: Date of Service: [name] 11/27/2022 1:30 PM Medical Record Number: [id number] Patient Account Number: [id number] Date of Birth/Sex: Treating RN: 02-08-1946 (77 y.o. M) Primary Care Riely Baskett: Marcelino Duster Other Clinician: Referring Shakeita Vandevander: Treating Tonnette Zwiebel/Extender: Joylene Grapes in Treatment: 6 Encounter Discharge Information Items Discharge Condition: Stable Ambulatory Status: Ambulatory Discharge Destination:  Home Transportation: Private Auto Accompanied By: self Schedule Follow-up Appointment: Yes Clinical Summary of Care: COBAIN, MORICI ([id number]) [id number] Page 2 of 2 Electronic Signature(s) Signed: 11/27/2022 3:41:22 PM By: Demetria Pore Entered By: Demetria Pore on 11/27/2022 12:31:43 -------------------------------------------------------------------------------- Vitals Details Patient Name: Date of Service: [name] 11/27/2022 1:30 PM Medical Record Number: [id number] Patient Account Number: [id number] Date of Birth/Sex: Treating RN: February 04, 1946 (77 y.o. M) Primary Care Farida Mcreynolds: Marcelino Duster Other Clinician: Referring Anslee Micheletti: Treating Faydra Korman/Extender: Joylene Grapes in Treatment: 6 Vital Signs Time Taken: 01:15 Temperature (F): 98.0 Height (in): 65 Pulse (bpm): 100 Weight (lbs): 163 Respiratory Rate (breaths/min): 16 Body Mass Index (BMI): 27.1 Blood Pressure (mmHg): 110/68 Reference Range: 80 - 120 mg / dl Airway Pulse Oximetry (%): 98 Electronic Signature(s) Signed: 11/27/2022 3:41:22 PM By: Demetria Pore Entered By: Demetria Pore on 11/27/2022 12:17:34

## 2022-11-28 ENCOUNTER — Encounter: Payer: Medicare Other | Admitting: Physician Assistant

## 2022-12-01 ENCOUNTER — Encounter: Payer: Medicare Other | Admitting: Physician Assistant

## 2022-12-01 DIAGNOSIS — N3041 Irradiation cystitis with hematuria: Secondary | ICD-10-CM | POA: Diagnosis not present

## 2022-12-01 NOTE — Progress Notes (Signed)
[name], FALLEN ([id number]) [id number] Page 1 of 2 Visit Report for 12/01/2022 HBO Details Patient Name: Date of Service: [name], [name] 12/01/2022 1:30 PM Medical Record Number: [id number] Patient Account Number: [id number] Date of Birth/Sex: Treating RN: 10-Nov-1945 (77 y.o. M) Primary Care Sandor Arboleda: Marcelino Duster Other Clinician: Referring Exodus Kutzer: Treating Loraina Stauffer/Extender: Joylene Grapes in Treatment: 6 HBO Treatment Course Details Treatment Course Number: 1 Ordering Silas Muff: Adrian Blackwater Treatments Ordered: otal 40 HBO Treatment Start Date: 11/17/2022 HBO Indication: Other (specify in Notes) Notes: Irradiation cystitis with hematuria HBO Treatment Details Treatment Number: 8 Patient Type: Outpatient Chamber Type: Monoplace Chamber Serial #: [id number] Treatment Protocol: 2.0 ATA with 90 minutes oxygen, and no air breaks Treatment Details Compression Rate Down: 2.0 psi / minute De-Compression Rate Up: 1.5 psi / minute Air breaks and breathing Decompress Decompress Compress Tx Pressure Begins Reached periods Begins Ends (leave unused spaces blank) Chamber Pressure (ATA 1 2 ------2 1 ) Clock Time (24 hr) 13:36 13:48 - - - - - - 15:18 15:28 Treatment Length: 112 (minutes) Treatment Segments: 4 Vital Signs Capillary Blood Glucose Reference Range: 80 - 120 mg / dl HBO Diabetic Blood Glucose Intervention Range: <131 mg/dl or >086 mg/dl Time Vitals Blood Respiratory Capillary Blood Glucose Pulse Action Type: Pulse: Temperature: Taken: Pressure: Rate: Glucose (mg/dl): Meter #: Oximetry (%) Taken: Pre 01:30 118/78 100 16 98 98 Post 15:28 120/80 98 16 97.8 Treatment Response Treatment Toleration: Well Treatment Completion Status: Treatment Completed without Adverse Event Electronic Signature(s) Signed: 12/01/2022 3:53:32 PM By: Demetria Pore Signed: 12/01/2022 4:48:00 PM By: Allen Derry PA-C Entered By: Demetria Pore on 12/01/2022 12:52:37 [name] ([id number]) [id number] Page 2 of 2 -------------------------------------------------------------------------------- HBO Safety Checklist Details Patient Name: Date of Service: [name], [name] 12/01/2022 1:30 PM Medical Record Number: [id number] Patient Account Number: [id number] Date of Birth/Sex: Treating RN: 25-Sep-1945 (77 y.o. M) Primary Care Lilyian Quayle: Marcelino Duster Other Clinician: Referring Mahaila Tischer: Treating Ferris Tally/Extender: Joylene Grapes in Treatment: 6 HBO Safety Checklist Items Safety Checklist Consent Form Signed Patient voided / foley secured and emptied When did you last eato 12/01/2022 Last dose of injectable or oral agent Ostomy pouch emptied and vented if applicable NA All implantable devices assessed, documented and approved NA Intravenous access site secured and place NA Valuables secured Linens and cotton and cotton/polyester blend (less than 51% polyester) Personal oil-based products / skin lotions / body lotions removed Wigs or hairpieces removed NA Smoking or tobacco materials removed NA Books / newspapers / magazines / loose paper removed Cologne, aftershave, perfume and deodorant removed Jewelry removed (may wrap wedding band) Make-up removed NA Hair care products removed Battery operated devices (external) removed NA Heating patches and chemical warmers removed NA Titanium eyewear removed NA Nail polish cured greater than 10 hours NA Casting material cured greater than 10 hours NA Hearing aids removed NA Loose dentures or partials removed NA Prosthetics have been removed NA Patient demonstrates correct use of air break device (if applicable) Patient concerns have been addressed Patient grounding bracelet on and cord attached to chamber Specifics for Inpatients (complete in addition to above) Medication sheet sent with patient NA Intravenous  medications needed or due during therapy sent with patient NA Drainage tubes (e.g. nasogastric tube or chest tube secured and vented) NA Endotracheal or Tracheotomy tube secured NA Cuff deflated of air and inflated with saline NA Airway suctioned NA Electronic Signature(s) Signed: 12/01/2022 3:53:32 PM By: Demetria Pore Entered By: Lynnell Jude  Kim on 12/01/2022 12:51:28

## 2022-12-01 NOTE — Progress Notes (Signed)
[name] ([id number]) [id number] Page 1 of 2 Visit Report for 12/01/2022 Arrival Information Details Patient Name: Date of Service: [name], [name] 12/01/2022 1:30 PM Medical Record Number: [id number] Patient Account Number: [id number] Date of Birth/Sex: Treating RN: Mar 20, 1945 (77 y.o. M) Primary Care Chas Axel: Marcelino Duster Other Clinician: Referring Ragen Laver: Treating Shakiyah Cirilo/Extender: Joylene Grapes in Treatment: 6 Visit Information History Since Last Visit Added or deleted any medications: No Patient Arrived: Ambulatory Any new allergies or adverse reactions: No Arrival Time: 01:30 Had a fall or experienced change in No Accompanied By: self activities of daily living that may affect Transfer Assistance: None risk of falls: Patient Requires Transmission-Based Precautions: No Signs or symptoms of abuse/neglect since last visito No Patient Has Alerts: No Hospitalized since last visit: No Implantable device outside of the clinic excluding No cellular tissue based products placed in the center since last visit: Pain Present Now: No Electronic Signature(s) Signed: 12/01/2022 3:53:32 PM By: Demetria Pore Entered By: Demetria Pore on 12/01/2022 12:51:20 -------------------------------------------------------------------------------- Encounter Discharge Information Details Patient Name: Date of Service: [name] 12/01/2022 1:30 PM Medical Record Number: [id number] Patient Account Number: [id number] Date of Birth/Sex: Treating RN: May 27, 1945 (77 y.o. M) Primary Care Josephina Melcher: Marcelino Duster Other Clinician: Referring Lakia Gritton: Treating Lama Narayanan/Extender: Joylene Grapes in Treatment: 6 Encounter Discharge Information Items Discharge Condition: Stable Ambulatory Status: Ambulatory Discharge Destination: Home Transportation: Private Auto Accompanied By: self Schedule Follow-up Appointment:  No Clinical Summary of Care: JARIEL, DROST ([id number]) [id number].pdf Page 2 of 2 Electronic Signature(s) Signed: 12/01/2022 3:53:32 PM By: Demetria Pore Entered By: Demetria Pore on 12/01/2022 12:53:06 -------------------------------------------------------------------------------- Vitals Details Patient Name: Date of Service: [name] 12/01/2022 1:30 PM Medical Record Number: [id number] Patient Account Number: [id number] Date of Birth/Sex: Treating RN: 1945-07-10 (77 y.o. M) Primary Care Shada Nienaber: Marcelino Duster Other Clinician: Referring Marik Sedore: Treating Jayanth Szczesniak/Extender: Joylene Grapes in Treatment: 6 Vital Signs Time Taken: 01:30 Temperature (F): 98.0 Height (in): 65 Pulse (bpm): 100 Weight (lbs): 163 Respiratory Rate (breaths/min): 16 Body Mass Index (BMI): 27.1 Blood Pressure (mmHg): 118/78 Reference Range: 80 - 120 mg / dl Airway Pulse Oximetry (%): 98 Electronic Signature(s) Signed: 12/01/2022 3:53:32 PM By: Demetria Pore Entered By: Demetria Pore on 12/01/2022 12:51:23

## 2022-12-02 ENCOUNTER — Encounter: Payer: Medicare Other | Admitting: Physician Assistant

## 2022-12-02 DIAGNOSIS — N3041 Irradiation cystitis with hematuria: Secondary | ICD-10-CM | POA: Diagnosis not present

## 2022-12-02 NOTE — Progress Notes (Signed)
CORDELRO, GAUTREAU ([id number]) [id number] Page 1 of 2 Visit Report for 12/02/2022 Arrival Information Details Patient Name: Date of Service: TROY, KANOUSE 12/02/2022 1:30 PM Medical Record Number: [id number] Patient Account Number: [id number] Date of Birth/Sex: Treating RN: 1945/03/30 (77 y.o. M) Primary Care Tiarna Koppen: Marcelino Duster Other Clinician: Referring Sydny Schnitzler: Treating Miklos Bidinger/Extender: Joylene Grapes in Treatment: 6 Visit Information History Since Last Visit Added or deleted any medications: No Patient Arrived: Ambulatory Any new allergies or adverse reactions: No Arrival Time: 01:30 Had a fall or experienced change in No Accompanied By: self activities of daily living that may affect Transfer Assistance: None risk of falls: Patient Requires Transmission-Based Precautions: No Signs or symptoms of abuse/neglect since last visito No Patient Has Alerts: No Hospitalized since last visit: No Implantable device outside of the clinic excluding No cellular tissue based products placed in the center since last visit: Pain Present Now: No Electronic Signature(s) Signed: 12/02/2022 3:37:04 PM By: Demetria Pore Entered By: Demetria Pore on 12/02/2022 15:23:12 -------------------------------------------------------------------------------- Encounter Discharge Information Details Patient Name: Date of Service: [name] 12/02/2022 1:30 PM Medical Record Number: [id number] Patient Account Number: [id number] Date of Birth/Sex: Treating RN: [birth year] (77 y.o. M) Primary Care Abrianna Sidman: Marcelino Duster Other Clinician: Referring Shelaine Frie: Treating Erskin Zinda/Extender: Joylene Grapes in Treatment: 6 Encounter Discharge Information Items Discharge Condition: Stable Ambulatory Status: Ambulatory Discharge Destination: Home Transportation: Private Auto Accompanied By: self Schedule Follow-up Appointment:  Yes Clinical Summary of Care: TEQUAN, REDMON ([id number]) [id number].pdf Page 2 of 2 Electronic Signature(s) Signed: 12/02/2022 3:37:04 PM By: Demetria Pore Entered By: Demetria Pore on 12/02/2022 15:28:08 -------------------------------------------------------------------------------- Vitals Details Patient Name: Date of Service: [name] 12/02/2022 1:30 PM Medical Record Number: [id number] Patient Account Number: [id number] Date of Birth/Sex: Treating RN: 05-10-1945 (77 y.o. M) Primary Care Rodgerick Gilliand: Marcelino Duster Other Clinician: Referring Dejanira Pamintuan: Treating Danyele Smejkal/Extender: Joylene Grapes in Treatment: 6 Vital Signs Time Taken: 01:30 Temperature (F): 98.0 Height (in): 65 Pulse (bpm): 68 Weight (lbs): 163 Respiratory Rate (breaths/min): 16 Body Mass Index (BMI): 27.1 Blood Pressure (mmHg): 110/70 Reference Range: 80 - 120 mg / dl Airway Pulse Oximetry (%): 98 Electronic Signature(s) Signed: 12/02/2022 3:37:04 PM By: Demetria Pore Entered By: Demetria Pore on 12/02/2022 15:24:06

## 2022-12-03 ENCOUNTER — Encounter: Payer: Medicare Other | Admitting: Internal Medicine

## 2022-12-03 DIAGNOSIS — N3041 Irradiation cystitis with hematuria: Secondary | ICD-10-CM | POA: Diagnosis not present

## 2022-12-03 NOTE — Progress Notes (Signed)
Jake, Johns ([id number]) [id number] Page 1 of 2 Visit Report for 12/02/2022 HBO Details Patient Name: Date of Service: [name], [name] 12/02/2022 1:30 PM Medical Record Number: [id number] Patient Account Number: [id number] Date of Birth/Sex: Treating RN: 06-17-1945 (77 y.o. 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 HBO Treatment Details Treatment Number: 9 Patient Type: Outpatient Chamber Type: Monoplace Chamber Serial #: [id number] Treatment Protocol: 2.0 ATA with 90 minutes oxygen, and no air breaks Treatment Details Compression Rate Down: 2.0 psi / minute De-Compression Rate Up: 1.5 psi / minute Air breaks and breathing Decompress Decompress Compress Tx Pressure Begins Reached periods Begins Ends (leave unused spaces blank) Chamber Pressure (ATA 1 2 ------2 1 ) Clock Time (24 hr) 13:35 13:47 - - - - - - 15:17 15:27 Treatment Length: 112 (minutes) Treatment Segments: 4 Vital Signs Capillary Blood Glucose Reference Range: 80 - 120 mg / dl HBO Diabetic Blood Glucose Intervention Range: <131 mg/dl or >440 mg/dl Time Vitals Blood Respiratory Capillary Blood Glucose Pulse Action Type: Pulse: Temperature: Taken: Pressure: Rate: Glucose (mg/dl): Meter #: Oximetry (%) Taken: Pre 01:30 110/70 68 16 98 98 Post 15:27 116/68 70 16 97.8 Treatment Response Treatment Toleration: Well Treatment Completion Status: Treatment Completed without Adverse Event Electronic Signature(s) Signed: 12/02/2022 3:37:04 PM By: Demetria Pore Signed: 12/02/2022 6:10:25 PM By: Allen Derry PA-C Entered By: Demetria Pore on 12/02/2022 15:36:35 [name] ([id number]) [id number] Page 2 of 2 -------------------------------------------------------------------------------- HBO Safety Checklist Details Patient Name: Date of Service: Jake, Johns 12/02/2022 1:30 PM Medical Record Number: [id number] Patient Account Number: [id number] Date of Birth/Sex: Treating RN: 11/26/1945 (77 y.o. M) Primary Care Brynli Ollis: Marcelino Duster Other Clinician: Referring Mubarak Bevens: Treating Khamarion Bjelland/Extender: Joylene Grapes in Treatment: 6 HBO Safety Checklist Items Safety Checklist Consent Form Signed Patient voided / foley secured and emptied When did you last eato 12/02/2022 Last dose of injectable or oral agent Ostomy pouch emptied and vented if applicable NA All implantable devices assessed, documented and approved NA Intravenous access site secured and place NA Valuables secured Linens and cotton and cotton/polyester blend (less than 51% polyester) Personal oil-based products / skin lotions / body lotions removed Wigs or hairpieces removed NA Smoking or tobacco materials removed NA Books / newspapers / magazines / loose paper removed Cologne, aftershave, perfume and deodorant removed Jewelry removed (may wrap wedding band) Make-up removed NA Hair care products removed Battery operated devices (external) removed NA Heating patches and chemical warmers removed NA Titanium eyewear removed NA Nail polish cured greater than 10 hours NA Casting material cured greater than 10 hours NA Hearing aids removed NA Loose dentures or partials removed NA Prosthetics have been removed NA Patient demonstrates correct use of air break device (if applicable) Patient concerns have been addressed Patient grounding bracelet on and cord attached to chamber Specifics for Inpatients (complete in addition to above) Medication sheet sent with patient NA Intravenous  medications needed or due during therapy sent with patient NA Drainage tubes (e.g. nasogastric tube or chest tube secured and vented) NA Endotracheal or Tracheotomy tube secured NA Cuff deflated of air and inflated with saline NA Airway suctioned NA Electronic Signature(s) Signed: 12/02/2022 3:37:04 PM By: Demetria Pore Entered By: Lynnell Jude  Kim on 12/02/2022 15:28:22

## 2022-12-04 ENCOUNTER — Encounter: Payer: Medicare Other | Admitting: Physician Assistant

## 2022-12-04 DIAGNOSIS — N3041 Irradiation cystitis with hematuria: Secondary | ICD-10-CM | POA: Diagnosis not present

## 2022-12-04 NOTE — Progress Notes (Signed)
[name], [name] ([id number]) [id number] Page 1 of 2 Visit Report for 12/03/2022 Arrival Information Details Patient Name: Date of Service: [name], [name] 12/03/2022 1:30 PM Medical Record Number: [id number] Patient Account Number: [id number] Date of Birth/Sex: Treating RN: Dec 10, 1945 (77 y.o. M) Primary Care Lashann Hagg: Marcelino Duster Other Clinician: Referring Anne-Marie Genson: Treating Sabrine Patchen/Extender: RO BSO N, MICHA EL Chauncy Passy in Treatment: 6 Visit Information History Since Last Visit Added or deleted any medications: No Patient Arrived: Ambulatory Any new allergies or adverse reactions: No Arrival Time: 14:24 Had a fall or experienced change in No Accompanied By: self activities of daily living that may affect Transfer Assistance: None risk of falls: Patient Identification Verified: Yes Signs or symptoms of abuse/neglect since last visito No Secondary Verification Process Completed: Yes Hospitalized since last visit: No Patient Requires Transmission-Based Precautions: No Implantable device outside of the clinic excluding No Patient Has Alerts: No cellular tissue based products placed in the center since last visit: Pain Present Now: No Electronic Signature(s) Signed: 12/04/2022 3:58:20 PM By: Demetria Pore Entered By: Demetria Pore on 12/03/2022 15:40:23 -------------------------------------------------------------------------------- Encounter Discharge Information Details Patient Name: Date of Service: [name] 12/03/2022 1:30 PM Medical Record Number: [id number] Patient Account Number: [id number] Date of Birth/Sex: Treating RN: May 03, 1945 (77 y.o. M) Primary Care Salisha Bardsley: Marcelino Duster Other Clinician: Referring Kassidy Dockendorf: Treating Lynda Capistran/Extender: RO BSO N, MICHA EL Chauncy Passy in Treatment: 6 Encounter Discharge Information Items Discharge Condition: Stable Ambulatory Status: Ambulatory Discharge  Destination: Home Transportation: Private Auto Accompanied By: self Schedule Follow-up Appointment: No Clinical Summary of Care: [name], [name] ([id number]) [id number] Page 2 of 2 Electronic Signature(s) Signed: 12/04/2022 3:58:20 PM By: Demetria Pore Entered By: Demetria Pore on 12/03/2022 15:41:32 -------------------------------------------------------------------------------- Vitals Details Patient Name: Date of Service: [name] 12/03/2022 1:30 PM Medical Record Number: [id number] Patient Account Number: [id number] Date of Birth/Sex: Treating RN: July 27, 1945 (77 y.o. M) Primary Care Sargun Rummell: Marcelino Duster Other Clinician: Referring Gladstone Rosas: Treating Jerelyn Trimarco/Extender: RO BSO N, MICHA EL Elveria Rising, John Weeks in Treatment: 6 Vital Signs Time Taken: 01:30 Temperature (F): 98.0 Height (in): 65 Pulse (bpm): 115 Weight (lbs): 163 Respiratory Rate (breaths/min): 16 Body Mass Index (BMI): 27.1 Blood Pressure (mmHg): 120/78 Reference Range: 80 - 120 mg / dl Airway Pulse Oximetry (%): 98 Electronic Signature(s) Signed: 12/04/2022 3:58:20 PM By: Demetria Pore Entered By: Demetria Pore on 12/03/2022 15:40:26

## 2022-12-04 NOTE — Progress Notes (Signed)
Jake, Johns ([id number]) [id number] Page 1 of 2 Visit Report for 12/03/2022 HBO Details Patient Name: Date of Service: [name], [name] 12/03/2022 1:30 PM Medical Record Number: [id number] Patient Account Number: [id number] Date of Birth/Sex: Treating RN: 07-17-1945 (77 y.o. 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 HBO Treatment Details Treatment Number: 10 Patient Type: Outpatient Chamber Type: Monoplace Chamber Serial #: [id number] Treatment Protocol: 2.0 ATA with 90 minutes oxygen, and no air breaks Treatment Details Compression Rate Down: 2.0 psi / minute De-Compression Rate Up: 1.5 psi / minute Air breaks and breathing Decompress Decompress Compress Tx Pressure Begins Reached periods Begins Ends (leave unused spaces blank) Chamber Pressure (ATA 1 2 ------2 1 ) Clock Time (24 hr) 13:38 13:50 - - - - - - 15:20 15:30 Treatment Length: 112 (minutes) Treatment Segments: 4 Vital Signs Capillary Blood Glucose Reference Range: 80 - 120 mg / dl HBO Diabetic Blood Glucose Intervention Range: <131 mg/dl or >962 mg/dl Time Vitals Blood Respiratory Capillary Blood Glucose Pulse Action Type: Pulse: Temperature: Taken: Pressure: Rate: Glucose (mg/dl): Meter #: Oximetry (%) Taken: Pre 01:30 120/78 115 16 98 98 Post 15:30 118/80 100 16 97.8 Treatment Response Treatment Toleration: Well Treatment Completion Status: Treatment Completed without Adverse Event Charmayne Odell Notes No concerns with treatment given. HBO Attestation I certify that I supervised this HBO treatment in accordance  with Medicare guidelines. A trained emergency response team is readily available per Yes hospital policies and procedures. Continue HBOT as ordered. JAMESSON, SHIMON ([id number]) [id number] Page 2 of 2 Electronic Signature(s) Signed: 12/03/2022 4:42:07 PM By: Baltazar Najjar MD Entered By: Baltazar Najjar on 12/03/2022 16:41:25 -------------------------------------------------------------------------------- HBO Safety Checklist Details Patient Name: Date of Service: [name] 12/03/2022 1:30 PM Medical Record Number: [id number] Patient Account Number: [id number] Date of Birth/Sex: Treating RN: Feb 26, 1945 (77 y.o. M) Primary Care Oluwanifemi Petitti: Marcelino Duster Other Clinician: Referring Nikitha Mode: Treating Mardell Cragg/Extender: RO BSO N, MICHA EL Elveria Rising, John Weeks in Treatment: 6 HBO Safety Checklist Items Safety Checklist Consent Form Signed Patient voided / foley secured and emptied When did you last eato 12/03/2022 Last dose of injectable or oral agent Ostomy pouch emptied and vented if applicable NA All implantable devices assessed, documented and approved NA Intravenous access site secured and place NA Valuables secured Linens and cotton and cotton/polyester blend (less than 51% polyester) Personal oil-based products / skin lotions / body lotions removed Wigs or hairpieces removed NA Smoking or tobacco materials removed NA Books / newspapers / magazines / loose paper removed Cologne, aftershave, perfume and deodorant removed Jewelry removed (may wrap wedding band) Make-up removed Hair care products removed Battery operated devices (external) removed NA Heating patches and chemical warmers removed NA Titanium eyewear removed NA Nail polish cured greater than 10 hours NA Casting material cured greater than 10 hours NA Hearing aids removed NA Loose dentures or partials removed NA Prosthetics have been removed NA Patient  demonstrates correct use of air break device (if applicable) Patient concerns have been addressed Patient grounding bracelet on and cord attached to chamber Specifics for Inpatients (complete in addition to above) Medication sheet sent with patient NA Intravenous medications needed or due during therapy sent with patient NA Drainage  tubes (e.g. nasogastric tube or chest tube secured and vented) NA Endotracheal or Tracheotomy tube secured NA Cuff deflated of air and inflated with saline NA Airway suctioned NA Electronic Signature(s) Signed: 12/04/2022 3:58:20 PM By: Demetria Pore Entered By: Demetria Pore on 12/04/2022 15:20:02

## 2022-12-04 NOTE — Progress Notes (Signed)
SULTAN, PARGAS ([id number]) [id number] Page 1 of 2 Visit Report for 12/04/2022 Arrival Information Details Patient Name: Date of Service: VIRL, COBLE 12/04/2022 1:30 PM Medical Record Number: [id number] Patient Account Number: [id number] Date of Birth/Sex: Treating RN: [birth year] (77 y.o. M) Primary Care Madelaine Whipple: Marcelino Duster Other Clinician: Referring Zane Pellecchia: Treating Micha Erck/Extender: Joylene Grapes in Treatment: 7 Visit Information History Since Last Visit Added or deleted any medications: No Patient Arrived: Ambulatory Any new allergies or adverse reactions: No Arrival Time: 14:52 Had a fall or experienced change in No Accompanied By: self activities of daily living that may affect Transfer Assistance: None risk of falls: Patient Identification Verified: Yes Signs or symptoms of abuse/neglect since last visito No Secondary Verification Process Completed: Yes Hospitalized since last visit: No Patient Requires Transmission-Based Precautions: No Implantable device outside of the clinic excluding No Patient Has Alerts: No cellular tissue based products placed in the center since last visit: Pain Present Now: No Electronic Signature(s) Signed: 12/04/2022 3:58:20 PM By: Demetria Pore Entered By: Demetria Pore on 12/04/2022 14:53:18 -------------------------------------------------------------------------------- Encounter Discharge Information Details Patient Name: Date of Service: [name] 12/04/2022 1:30 PM Medical Record Number: [id number] Patient Account Number: [id number] Date of Birth/Sex: Treating RN: 13-Jun-1945 (77 y.o. M) Primary Care Nazar Kuan: Marcelino Duster Other Clinician: Referring Marybel Alcott: Treating Elchonon Maxson/Extender: Joylene Grapes in Treatment: 7 Encounter Discharge Information Items Discharge Condition: Stable Ambulatory Status: Ambulatory Discharge Destination:  Home Transportation: Private Auto Accompanied By: self Schedule Follow-up Appointment: Yes Clinical Summary of Care: ANDRELL, BERGESON ([id number]) [phone].pdf Page 2 of 2 Electronic Signature(s) Signed: 12/04/2022 3:58:20 PM By: Demetria Pore Entered By: Demetria Pore on 12/04/2022 15:57:56 -------------------------------------------------------------------------------- Vitals Details Patient Name: Date of Service: [name] 12/04/2022 1:30 PM Medical Record Number: [id number] Patient Account Number: [id number] Date of Birth/Sex: Treating RN: [birth year] (77 y.o. M) Primary Care Alexza Norbeck: Marcelino Duster Other Clinician: Referring Lindee Leason: Treating Marty Uy/Extender: Joylene Grapes in Treatment: 7 Vital Signs Time Taken: 02:00 Temperature (F): 98.0 Height (in): 65 Pulse (bpm): 98 Weight (lbs): 163 Respiratory Rate (breaths/min): 16 Body Mass Index (BMI): 27.1 Blood Pressure (mmHg): 108/60 Reference Range: 80 - 120 mg / dl Airway Pulse Oximetry (%): 98 Electronic Signature(s) Signed: 12/04/2022 3:58:20 PM By: Demetria Pore Entered By: Demetria Pore on 12/04/2022 15:48:27

## 2022-12-05 ENCOUNTER — Encounter: Payer: Medicare Other | Admitting: Physician Assistant

## 2022-12-05 NOTE — Progress Notes (Signed)
TROTTER, REMSON ([id number]) [id number] Page 1 of 2 Visit Report for 12/04/2022 HBO Details Patient Name: Date of Service: [name], [name] 12/04/2022 1:30 PM Medical Record Number: [id number] Patient Account Number: [id number] Date of Birth/Sex: Treating RN: [birth year] (77 y.o. M) Primary Care Kathyann Spaugh: Marcelino Duster Other Clinician: Referring Annisha Baar: Treating Merlean Pizzini/Extender: Joylene Grapes in Treatment: 7 HBO Treatment Course Details Treatment Course Number: 1 Ordering Trinika Cortese: Adrian Blackwater Treatments Ordered: otal 40 HBO Treatment Start Date: 11/17/2022 HBO Indication: Other (specify in Notes) Notes: Irradiation cystitis with hematuria HBO Treatment Details Treatment Number: 11 Patient Type: Outpatient Chamber Type: Monoplace Chamber Serial #: [id number] Treatment Protocol: 2.0 ATA with 90 minutes oxygen, and no air breaks Treatment Details Compression Rate Down: 2.0 psi / minute De-Compression Rate Up: 1.5 psi / minute Air breaks and breathing Decompress Decompress Compress Tx Pressure Begins Reached periods Begins Ends (leave unused spaces blank) Chamber Pressure (ATA 1 2 ------2 1 ) Clock Time (24 hr) 14:05 14:17 - - - - - - 15:48 15:58 Treatment Length: 113 (minutes) Treatment Segments: 4 Vital Signs Capillary Blood Glucose Reference Range: 80 - 120 mg / dl HBO Diabetic Blood Glucose Intervention Range: <131 mg/dl or >536 mg/dl Time Vitals Blood Respiratory Capillary Blood Glucose Pulse Action Type: Pulse: Temperature: Taken: Pressure: Rate: Glucose (mg/dl): Meter #: Oximetry (%) Taken: Pre 02:00 108/60 98 16 98 98 Post 15:58 110/64 100 16 97.8 Treatment Response Treatment Toleration: Well Treatment Completion Status: Treatment Completed without Adverse Event Electronic Signature(s) Signed: 12/04/2022 3:58:20 PM By: Demetria Pore Signed: 12/04/2022 5:31:22 PM By: Allen Derry PA-C Entered By:  Demetria Pore on 12/04/2022 15:57:27 [name] ([id number]) [id number] Page 2 of 2 -------------------------------------------------------------------------------- HBO Safety Checklist Details Patient Name: Date of Service: DRAYCEN, WEISHAUPT 12/04/2022 1:30 PM Medical Record Number: [id number] Patient Account Number: [id number] Date of Birth/Sex: Treating RN: [birth year] (77 y.o. M) Primary Care Kinzee Happel: Marcelino Duster Other Clinician: Referring Osiel Stick: Treating Tashunda Vandezande/Extender: Joylene Grapes in Treatment: 7 HBO Safety Checklist Items Safety Checklist Consent Form Signed Patient voided / foley secured and emptied When did you last eato 12/04/22 Last dose of injectable or oral agent Ostomy pouch emptied and vented if applicable NA All implantable devices assessed, documented and approved NA Intravenous access site secured and place NA Valuables secured Linens and cotton and cotton/polyester blend (less than 51% polyester) Personal oil-based products / skin lotions / body lotions removed Wigs or hairpieces removed NA Smoking or tobacco materials removed NA Books / newspapers / magazines / loose paper removed Cologne, aftershave, perfume and deodorant removed Jewelry removed (may wrap wedding band) NA Make-up removed NA Hair care products removed Battery operated devices (external) removed NA Heating patches and chemical warmers removed NA Titanium eyewear removed NA Nail polish cured greater than 10 hours NA Casting material cured greater than 10 hours NA Hearing aids removed NA Loose dentures or partials removed NA Prosthetics have been removed NA Patient demonstrates correct use of air break device (if applicable) Patient concerns have been addressed Patient grounding bracelet on and cord attached to chamber Specifics for Inpatients (complete in addition to above) Medication sheet sent with  patient NA Intravenous medications needed or due during therapy sent with patient NA Drainage tubes (e.g. nasogastric tube or chest tube secured and vented) NA Endotracheal or Tracheotomy tube secured NA Cuff deflated of air and inflated with saline NA Airway suctioned NA Electronic Signature(s) Signed: 12/04/2022 3:58:20 PM By: Demetria Pore Entered By:  Demetria Pore on 12/04/2022 15:48:30

## 2022-12-08 ENCOUNTER — Encounter: Payer: Medicare Other | Admitting: Physician Assistant

## 2022-12-08 DIAGNOSIS — N3041 Irradiation cystitis with hematuria: Secondary | ICD-10-CM | POA: Diagnosis not present

## 2022-12-08 NOTE — Progress Notes (Signed)
KNOWLEDGE, BAQUERO ([id number]) [id number] Page 1 of 2 Visit Report for 12/08/2022 Arrival Information Details Patient Name: Date of Service: [name], [name] 12/08/2022 1:30 PM Medical Record Number: [id number] Patient Account Number: [id number] Date of Birth/Sex: Treating RN: [birth year] (77 y.o. M) Primary Care Art Levan: Marcelino Duster Other Clinician: Referring Carmina Walle: Treating Yostin Malacara/Extender: Joylene Grapes in Treatment: 7 Visit Information History Since Last Visit Added or deleted any medications: No Patient Arrived: Ambulatory Any new allergies or adverse reactions: No Arrival Time: 01:30 Had a fall or experienced change in No Accompanied By: self activities of daily living that may affect Transfer Assistance: None risk of falls: Patient Identification Verified: Yes Signs or symptoms of abuse/neglect since last visito No Secondary Verification Process Completed: Yes Hospitalized since last visit: No Patient Requires Transmission-Based Precautions: No Implantable device outside of the clinic excluding No Patient Has Alerts: No cellular tissue based products placed in the center since last visit: Pain Present Now: No Electronic Signature(s) Signed: 12/08/2022 4:03:00 PM By: Demetria Pore Entered By: Demetria Pore on 12/08/2022 12:02:58 -------------------------------------------------------------------------------- Encounter Discharge Information Details Patient Name: Date of Service: [name] 12/08/2022 1:30 PM Medical Record Number: [id number] Patient Account Number: [id number] Date of Birth/Sex: Treating RN: 07/10/1945 (77 y.o. M) Primary Care Lauri Purdum: Marcelino Duster Other Clinician: Referring Kiarra Kidd: Treating Glenard Keesling/Extender: Joylene Grapes in Treatment: 7 Encounter Discharge Information Items Discharge Condition: Stable Ambulatory Status: Ambulatory Discharge Destination:  Home Transportation: Private Auto Accompanied By: self Schedule Follow-up Appointment: Yes Clinical Summary of Care: SIDDHANTH, EVENER ([id number]) [id number] Page 2 of 2 Electronic Signature(s) Signed: 12/08/2022 4:03:00 PM By: Demetria Pore Entered By: Demetria Pore on 12/08/2022 13:02:41 -------------------------------------------------------------------------------- Vitals Details Patient Name: Date of Service: [name] 12/08/2022 1:30 PM Medical Record Number: [id number] Patient Account Number: [id number] Date of Birth/Sex: Treating RN: 10-Feb-1946 (77 y.o. M) Primary Care Jamilyn Pigeon: Marcelino Duster Other Clinician: Referring Josselin Gaulin: Treating Davit Vassar/Extender: Joylene Grapes in Treatment: 7 Vital Signs Time Taken: 01:30 Temperature (F): 98.0 Height (in): 65 Pulse (bpm): 98 Weight (lbs): 163 Respiratory Rate (breaths/min): 16 Body Mass Index (BMI): 27.1 Blood Pressure (mmHg): 118/68 Reference Range: 80 - 120 mg / dl Airway Pulse Oximetry (%): 99 Electronic Signature(s) Signed: 12/08/2022 4:03:00 PM By: Demetria Pore Entered By: Demetria Pore on 12/08/2022 12:03:01

## 2022-12-08 NOTE — Progress Notes (Signed)
Jake, Johns ([id number]) [id number] Page 1 of 2 Visit Report for 12/08/2022 HBO Details Patient Name: Date of Service: [name], [name] 12/08/2022 1:30 PM Medical Record Number: [id number] Patient Account Number: [id number] Date of Birth/Sex: Treating RN: [birth year] (77 y.o. M) Primary Care Temperance Kelemen: Marcelino Duster Other Clinician: Referring Aurore Redinger: Treating Mellie Buccellato/Extender: Joylene Grapes in Treatment: 7 HBO Treatment Course Details Treatment Course Number: 1 Ordering Ahtziri Jeffries: Adrian Blackwater Treatments Ordered: otal 40 HBO Treatment Start Date: 11/17/2022 HBO Indication: Other (specify in Notes) Notes: Irradiation cystitis with hematuria HBO Treatment Details Treatment Number: 12 Patient Type: Outpatient Chamber Type: Monoplace Chamber Serial #: [id number] Treatment Protocol: 2.0 ATA with 90 minutes oxygen, and no air breaks Treatment Details Compression Rate Down: 1.5 psi / minute De-Compression Rate Up: 1.5 psi / minute Air breaks and breathing Decompress Decompress Compress Tx Pressure Begins Reached periods Begins Ends (leave unused spaces blank) Chamber Pressure (ATA 1 2 ------2 1 ) Clock Time (24 hr) 13:49 14:01 - - - - - - 15:31 15:41 Treatment Length: 112 (minutes) Treatment Segments: 4 Vital Signs Capillary Blood Glucose Reference Range: 80 - 120 mg / dl HBO Diabetic Blood Glucose Intervention Range: <131 mg/dl or >782 mg/dl Time Vitals Blood Respiratory Capillary Blood Glucose Pulse Action Type: Pulse: Temperature: Taken: Pressure: Rate: Glucose (mg/dl): Meter #: Oximetry (%) Taken: Pre 01:30 118/68 98 16 98 99 Post 15:41 110/68 94 16 97.8 100 Treatment Response Treatment Toleration: Well Treatment Completion Status: Treatment Completed without Adverse Event Electronic Signature(s) Signed: 12/08/2022 4:03:00 PM By: Demetria Pore Signed: 12/08/2022 4:50:43 PM By: Allen Derry PA-C Entered By:  Demetria Pore on 12/08/2022 16:02:17 [name] ([id number]) [id number] Page 2 of 2 -------------------------------------------------------------------------------- HBO Safety Checklist Details Patient Name: Date of Service: Jake, Johns 12/08/2022 1:30 PM Medical Record Number: [id number] Patient Account Number: [id number] Date of Birth/Sex: Treating RN: Jun 30, 1945 (77 y.o. M) Primary Care Kyl Givler: Marcelino Duster Other Clinician: Referring Glori Machnik: Treating Dalayah Deahl/Extender: Joylene Grapes in Treatment: 7 HBO Safety Checklist Items Safety Checklist Consent Form Signed Patient voided / foley secured and emptied When did you last eato 12/08/2022 Last dose of injectable or oral agent Ostomy pouch emptied and vented if applicable NA All implantable devices assessed, documented and approved NA Intravenous access site secured and place NA Valuables secured Linens and cotton and cotton/polyester blend (less than 51% polyester) Personal oil-based products / skin lotions / body lotions removed Wigs or hairpieces removed NA Smoking or tobacco materials removed NA Books / newspapers / magazines / loose paper removed Cologne, aftershave, perfume and deodorant removed Jewelry removed (may wrap wedding band) Make-up removed NA Hair care products removed Battery operated devices (external) removed Heating patches and chemical warmers removed NA Titanium eyewear removed NA Nail polish cured greater than 10 hours NA Casting material cured greater than 10 hours NA Hearing aids removed NA Loose dentures or partials removed NA Prosthetics have been removed NA Patient demonstrates correct use of air break device (if applicable) Patient concerns have been addressed Patient grounding bracelet on and cord attached to chamber Specifics for Inpatients (complete in addition to above) Medication sheet sent with  patient NA Intravenous medications needed or due during therapy sent with patient NA Drainage tubes (e.g. nasogastric tube or chest tube secured and vented) NA Endotracheal or Tracheotomy tube secured NA Cuff deflated of air and inflated with saline NA Airway suctioned NA Electronic Signature(s) Signed: 12/08/2022 4:03:00 PM By: Demetria Pore Entered By: Lynnell Jude  Kim on 12/08/2022 15:03:04

## 2022-12-08 NOTE — Progress Notes (Signed)
[name], [name] ([id number]) [id number] Page 1 of 2 Visit Report for 12/08/2022 Problem List Details Patient Name: Date of Service: [name], [name] 12/08/2022 1:30 PM Medical Record Number: [id number] Patient Account Number: [id number] Date of Birth/Sex: Treating RN: 03/12/1945 (77 y.o. 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 I25.5 Ischemic cardiomyopathy Facility Procedures : CPT4 Code: 53664403 Description: (Facility Use Only) HBOT full body chamber, , Modifier: Quantity:  4 Physician Procedures : CPT4: Description Modifier Code 47425 [id number] Physician attendance and supervision of hyperbaric oxygen therapy, per sessionsupervision of hyperbaric Oxygen therapy, per session ICD-10 Diagnosis Description N30.41 Irradiation cystitis with hematuria Z85.51  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

## 2022-12-09 ENCOUNTER — Encounter: Payer: Medicare Other | Admitting: Physician Assistant

## 2022-12-09 DIAGNOSIS — N3041 Irradiation cystitis with hematuria: Secondary | ICD-10-CM | POA: Diagnosis not present

## 2022-12-10 ENCOUNTER — Encounter: Payer: Medicare Other | Admitting: Internal Medicine

## 2022-12-10 DIAGNOSIS — N3041 Irradiation cystitis with hematuria: Secondary | ICD-10-CM | POA: Diagnosis not present

## 2022-12-10 NOTE — Progress Notes (Signed)
KENSEN, TIERRABLANCA ([id number]) [id number] Page 1 of 2 Visit Report for 12/10/2022 Arrival Information Details Patient Name: Date of Service: PRINCEAMIR, ASAD 12/10/2022 1:30 PM Medical Record Number: [id number] Patient Account Number: [id number] Date of Birth/Sex: Treating RN: October 03, 1945 (77 y.o. M) Primary Care Momoko Slezak: Marcelino Duster Other Clinician: Referring Rafeal Skibicki: Treating Fredna Stricker/Extender: Joylene Grapes in Treatment: 7 Visit Information History Since Last Visit Added or deleted any medications: No Patient Arrived: Ambulatory Any new allergies or adverse reactions: No Arrival Time: 01:36 Had a fall or experienced change in No Accompanied By: self activities of daily living that may affect Transfer Assistance: None risk of falls: Patient Identification Verified: Yes Signs or symptoms of abuse/neglect since last visito No Secondary Verification Process Completed: Yes Hospitalized since last visit: No Patient Requires Transmission-Based Precautions: No Implantable device outside of the clinic excluding No Patient Has Alerts: No cellular tissue based products placed in the center since last visit: Pain Present Now: No Electronic Signature(s) Signed: 12/10/2022 3:53:27 PM By: Demetria Pore Entered By: Demetria Pore on 12/10/2022 13:49:29 -------------------------------------------------------------------------------- Encounter Discharge Information Details Patient Name: Date of Service: [name] 12/10/2022 1:30 PM Medical Record Number: [id number] Patient Account Number: [id number] Date of Birth/Sex: Treating RN: September 29, 1945 (77 y.o. M) Primary Care Reyaansh Merlo: Marcelino Duster Other Clinician: Referring Karlos Scadden: Treating Chellsie Gomer/Extender: Joylene Grapes in Treatment: 7 Encounter Discharge Information Items Discharge Condition: Stable Ambulatory Status: Ambulatory Discharge Destination:  Home Transportation: Private Auto Accompanied By: self Schedule Follow-up Appointment: Yes Clinical Summary of Care: KHAIDYN, JEWART ([id number]) [id number] Page 2 of 2 Electronic Signature(s) Signed: 12/10/2022 3:53:27 PM By: Demetria Pore Entered By: Demetria Pore on 12/10/2022 15:52:42 -------------------------------------------------------------------------------- Vitals Details Patient Name: Date of Service: [name] 12/10/2022 1:30 PM Medical Record Number: [id number] Patient Account Number: [id number] Date of Birth/Sex: Treating RN: 05-04-1945 (77 y.o. M) Primary Care Ladon Heney: Marcelino Duster Other Clinician: Referring Victorious Kundinger: Treating Birda Didonato/Extender: Joylene Grapes in Treatment: 7 Vital Signs Time Taken: 01:30 Temperature (F): 98.0 Height (in): 65 Pulse (bpm): 100 Weight (lbs): 163 Respiratory Rate (breaths/min): 16 Body Mass Index (BMI): 27.1 Blood Pressure (mmHg): 110/68 Reference Range: 80 - 120 mg / dl Airway Pulse Oximetry (%): 99 Electronic Signature(s) Signed: 12/10/2022 3:53:27 PM By: Demetria Pore Entered By: Demetria Pore on 12/10/2022 13:51:50

## 2022-12-10 NOTE — Progress Notes (Signed)
YASSIN, KRAVETS ([id number]) [id number] Page 1 of 2 Visit Report for 12/09/2022 HBO Details Patient Name: Date of Service: [name], [name] 12/09/2022 1:30 PM Medical Record Number: [id number] Patient Account Number: [id number] Date of Birth/Sex: Treating RN: 11/04/1945 (77 y.o. M) Primary Care Macky Galik: Marcelino Duster Other Clinician: Referring Floyd Lusignan: Treating Denisa Enterline/Extender: Joylene Grapes in Treatment: 7 HBO Treatment Course Details Treatment Course Number: 1 Ordering Britainy Kozub: Adrian Blackwater Treatments Ordered: otal 40 HBO Treatment Start Date: 11/17/2022 HBO Indication: Other (specify in Notes) Notes: Irradiation cystitis with hematuria HBO Treatment Details Treatment Number: 13 Patient Type: Outpatient Chamber Type: Monoplace Chamber Serial #: [id number] Treatment Protocol: 2.0 ATA with 90 minutes oxygen, and no air breaks Treatment Details Compression Rate Down: 2.0 psi / minute De-Compression Rate Up: 1.5 psi / minute Air breaks and breathing Decompress Decompress Compress Tx Pressure Begins Reached periods Begins Ends (leave unused spaces blank) Chamber Pressure (ATA 1 2 ------2 1 ) Clock Time (24 hr) 13:37 13:48 - - - - - - 15:18 15:28 Treatment Length: 111 (minutes) Treatment Segments: 4 Vital Signs Capillary Blood Glucose Reference Range: 80 - 120 mg / dl HBO Diabetic Blood Glucose Intervention Range: <131 mg/dl or >176 mg/dl Time Vitals Blood Respiratory Capillary Blood Glucose Pulse Action Type: Pulse: Temperature: Taken: Pressure: Rate: Glucose (mg/dl): Meter #: Oximetry (%) Taken: Pre 01:30 110/68 100 16 98 98 Post 15:28 110/64 85 16 97.8 Treatment Response Treatment Toleration: Well Treatment Completion Status: Treatment Completed without Adverse Event Electronic Signature(s) Signed: 12/09/2022 4:48:34 PM By: Allen Derry PA-C Signed: 12/10/2022 3:53:27 PM By: Demetria Pore Entered By:  Demetria Pore on 12/09/2022 15:56:58 [name] ([id number]) [id number] Page 2 of 2 -------------------------------------------------------------------------------- HBO Safety Checklist Details Patient Name: Date of Service: [name], [name] 12/09/2022 1:30 PM Medical Record Number: [id number] Patient Account Number: [id number] Date of Birth/Sex: Treating RN: 1945-06-30 (77 y.o. M) Primary Care Alphonza Tramell: Marcelino Duster Other Clinician: Referring Zaara Sprowl: Treating Berline Semrad/Extender: Joylene Grapes in Treatment: 7 HBO Safety Checklist Items Safety Checklist Consent Form Signed Patient voided / foley secured and emptied When did you last eato 12/09/22 Last dose of injectable or oral agent Ostomy pouch emptied and vented if applicable NA All implantable devices assessed, documented and approved NA Intravenous access site secured and place NA Valuables secured Linens and cotton and cotton/polyester blend (less than 51% polyester) Personal oil-based products / skin lotions / body lotions removed Wigs or hairpieces removed NA Smoking or tobacco materials removed NA Books / newspapers / magazines / loose paper removed Cologne, aftershave, perfume and deodorant removed Jewelry removed (may wrap wedding band) Make-up removed NA Hair care products removed Battery operated devices (external) removed NA Heating patches and chemical warmers removed NA Titanium eyewear removed NA Nail polish cured greater than 10 hours NA Casting material cured greater than 10 hours NA Hearing aids removed NA Loose dentures or partials removed NA Prosthetics have been removed NA Patient demonstrates correct use of air break device (if applicable) Patient concerns have been addressed Patient grounding bracelet on and cord attached to chamber Specifics for Inpatients (complete in addition to above) Medication sheet sent with  patient NA Intravenous medications needed or due during therapy sent with patient NA Drainage tubes (e.g. nasogastric tube or chest tube secured and vented) NA Endotracheal or Tracheotomy tube secured NA Cuff deflated of air and inflated with saline NA Airway suctioned NA Electronic Signature(s) Signed: 12/10/2022 3:53:27 PM By: Demetria Pore Entered By: Lynnell Jude  Kim on 12/09/2022 15:56:26

## 2022-12-10 NOTE — Progress Notes (Signed)
[facility], TALK ([id number]) [id number] Page 1 of 2 Visit Report for 12/09/2022 Arrival Information Details Patient Name: Date of Service: [name], [name] 12/09/2022 1:30 PM Medical Record Number: [id number] Patient Account Number: [id number] Date of Birth/Sex: Treating RN: 05-Mar-1945 (77 y.o. M) Primary Care Saydie Gerdts: Marcelino Duster Other Clinician: Referring Kacey Vicuna: Treating Recia Sons/Extender: Joylene Grapes in Treatment: 7 Visit Information History Since Last Visit Added or deleted any medications: No Patient Arrived: Ambulatory Any new allergies or adverse reactions: No Arrival Time: 01:37 Had a fall or experienced change in No Accompanied By: self activities of daily living that may affect Transfer Assistance: None risk of falls: Patient Requires Transmission-Based Precautions: No Signs or symptoms of abuse/neglect since last visito No Patient Has Alerts: No Hospitalized since last visit: No Implantable device outside of the clinic excluding No cellular tissue based products placed in the center since last visit: Pain Present Now: No Electronic Signature(s) Signed: 12/10/2022 3:53:27 PM By: Demetria Pore Entered By: Demetria Pore on 12/09/2022 15:49:37 -------------------------------------------------------------------------------- Encounter Discharge Information Details Patient Name: Date of Service: [name] 12/09/2022 1:30 PM Medical Record Number: [id number] Patient Account Number: [id number] Date of Birth/Sex: Treating RN: Nov 12, 1945 (77 y.o. M) Primary Care Zakariyya Helfman: Marcelino Duster Other Clinician: Referring Xaine Sansom: Treating Chrishawna Farina/Extender: Joylene Grapes in Treatment: 7 Encounter Discharge Information Items Discharge Condition: Stable Ambulatory Status: Ambulatory Discharge Destination: Home Transportation: Private Auto Accompanied By: self Schedule Follow-up Appointment:  Yes Clinical Summary of Care: FORBES, PARZIALE ([id number]) [phone] Page 2 of 2 Electronic Signature(s) Signed: 12/10/2022 3:53:27 PM By: Demetria Pore Entered By: Demetria Pore on 12/09/2022 15:58:07 -------------------------------------------------------------------------------- Vitals Details Patient Name: Date of Service: [name] 12/09/2022 1:30 PM Medical Record Number: [id number] Patient Account Number: [id number] Date of Birth/Sex: Treating RN: [birth year] (77 y.o. M) Primary Care Syon Tews: Marcelino Duster Other Clinician: Referring Kaari Zeigler: Treating Lovett Coffin/Extender: Joylene Grapes in Treatment: 7 Vital Signs Time Taken: 01:30 Temperature (F): 98.0 Height (in): 65 Pulse (bpm): 100 Weight (lbs): 163 Respiratory Rate (breaths/min): 16 Body Mass Index (BMI): 27.1 Blood Pressure (mmHg): 110/68 Reference Range: 80 - 120 mg / dl Airway Pulse Oximetry (%): 98 Electronic Signature(s) Signed: 12/10/2022 3:53:27 PM By: Demetria Pore Entered By: Demetria Pore on 12/09/2022 15:49:44

## 2022-12-11 ENCOUNTER — Encounter: Payer: Medicare Other | Admitting: Physician Assistant

## 2022-12-12 ENCOUNTER — Encounter: Payer: Medicare Other | Admitting: Physician Assistant

## 2022-12-12 NOTE — Progress Notes (Addendum)
[name] ([id number]) [id number] Page 1 of 3 Visit Report for 12/10/2022 HBO Details Patient Name: Date of Service: [name], [name] 12/10/2022 1:30 PM Medical Record Number: [id number] Patient Account Number: [id number] Date of Birth/Sex: Treating RN: [birth year] (77 y.o. M) Primary Care [name]: [name] Other Clinician: Referring [name]: Treating [name]/Extender: [name] in Treatment: 7 HBO Treatment Course Details Treatment Course Number: 1 Ordering [name]: [name] Treatments Ordered: otal 40 HBO Treatment Start 11/17/2022 Date: HBO Indication: Other (specify in Notes) HBO Treatment End 12/10/2022 Date: Notes: Irradiation cystitis with hematuria HBO Discharge Treatment Series Incomplete; Medical Event- Outcome: Unrelated to HBO HBO Treatment Details Treatment Number: 14 Patient Type: Outpatient Chamber Type: [name] #: [id number] Treatment Protocol: 2.0 ATA with 90 minutes oxygen, and no air breaks Treatment Details Compression Rate Down: 2.0 psi / minute De-Compression Rate Up: 1.5 psi / minute Air breaks and breathing Decompress Decompress Compress Tx Pressure Begins Reached periods Begins Ends (leave unused spaces blank) Chamber Pressure (ATA 1 2 ------2 1 ) Clock Time (24 hr) 13:36 13:47 - - - - - - 15:19 15:28 Treatment Length: 112 (minutes) Treatment Segments: 4 Vital Signs Capillary Blood Glucose Reference Range: 80 - 120 mg / dl HBO Diabetic Blood Glucose Intervention Range: <131 mg/dl or >782 mg/dl Time Vitals Blood Respiratory Capillary Blood Glucose Pulse Action Type: Pulse: Temperature: Taken: Pressure: Rate: Glucose (mg/dl): Meter #: Oximetry (%) Taken: Pre 01:30 110/68 100 16 98 99 Post 15:28 110/68 110 16 97.8 Treatment Response Treatment Toleration: Well Treatment Completion Status: Treatment Completed without Adverse Event Treatment  Notes Pt asked if he could use Cetaphil Hydrating Moisturizing cream/lotion before he gets in the chamber I said no and explained why. Pt requested [name] to come speak with him regarding lotion. [name] agrees not on approved list to use while in chamber. After [name] left the room, Patient stated he was going to sneak and use cream before he comes in. I stated that is dangerous and I wont be able to treat him if he does not agree to the safety guidelines. [name] Notified. Reached out to Clinicals [name] they said to find an alternative barrier from the approved list as well as reach out to manufacture to see if it has been pressure tested. Manufacture stated has not been tested. I recommended Zinc Oxide which is on the approved list. [name], [name] ([id number]) [id number] Page 2 of 3 [name] Notes No concerns with treatment given. The patient was upset about finding an ointment to protect his skin and genital area from urine leakage in the chambers. We contacted heel logics and said that zinc oxide would be fine. With regards to his symptomatology I cautioned it is still early [treatment #13] to determine any effect of the HBO HBO Attestation I certify that I supervised this HBO treatment in accordance with Medicare guidelines. A trained emergency response team is readily available per Yes hospital policies and procedures. Continue HBOT as ordered. Yes Electronic Signature(s) Signed: 12/23/2022 3:49:33 PM By: [name], BSN, RN, CWS, Kim RN, BSN Signed: 01/01/2023 7:58:12 AM By: Jake Derry PA-C Previous Signature: 12/11/2022 3:33:32 PM Version By: Jake Najjar MD Previous Signature: 12/11/2022 6:30:03 PM Version By: Jake Derry PA-C Previous Signature: 12/10/2022 3:53:27 PM Version By: [name] Entered By: [name] BSN, RN, CWS, [name] on 12/23/2022 15:49:32 -------------------------------------------------------------------------------- HBO Safety Checklist  Details Patient Name: Date of Service: [name], [name] 12/10/2022 1:30 PM Medical Record Number: [id number] Patient Account  Number: [id number] Date of Birth/Sex: Treating RN: 12-18-1945 (77 y.o. M) Primary Care [name]: [name] Other Clinician: Referring [name]: Treating [name]/Extender: [name] in Treatment: 7 HBO Safety Checklist Items Safety Checklist Consent Form Signed Patient voided / foley secured and emptied When did you last eato 12/10/22 Last dose of injectable or oral agent Ostomy pouch emptied and vented if applicable NA All implantable devices assessed, documented and approved NA Intravenous access site secured and place NA Valuables secured Linens and cotton and cotton/polyester blend (less than 51% polyester) Personal oil-based products / skin lotions / body lotions removed Wigs or hairpieces removed NA Smoking or tobacco materials removed NA Books / newspapers / magazines / loose paper removed Cologne, aftershave, perfume and deodorant removed Jewelry removed (may wrap wedding band) Make-up removed Hair care products removed Battery operated devices (external) removed NA Heating patches and chemical warmers removed NA Titanium eyewear removed NA Nail polish cured greater than 10 hours NA Casting material cured greater than 10 hours NA Hearing aids removed NA Loose dentures or partials removed NA Prosthetics have been removed NA Patient demonstrates correct use of air break device (if applicable) Patient concerns have been addressed Patient grounding bracelet on and cord attached to chamber [name], [name] ([id number]) [id number] Page 3 of 3 Specifics for Inpatients (complete in addition to above) Medication sheet sent with patient NA Intravenous medications needed or due during therapy sent with patient NA Drainage tubes (e.Johns. nasogastric tube or chest tube secured and  vented) NA Endotracheal or Tracheotomy tube secured NA Cuff deflated of air and inflated with saline NA Airway suctioned NA Electronic Signature(s) Signed: 12/10/2022 3:53:27 PM By: [name] Entered By: [name] on 12/10/2022 13:52:56

## 2022-12-15 ENCOUNTER — Encounter: Payer: Medicare Other | Admitting: Physician Assistant

## 2022-12-16 ENCOUNTER — Other Ambulatory Visit: Payer: Self-pay

## 2022-12-16 ENCOUNTER — Telehealth: Payer: Self-pay | Admitting: *Deleted

## 2022-12-16 ENCOUNTER — Encounter: Payer: Medicare Other | Admitting: Physician Assistant

## 2022-12-16 ENCOUNTER — Inpatient Hospital Stay: Payer: Medicare Other

## 2022-12-16 ENCOUNTER — Encounter: Payer: Self-pay | Admitting: Oncology

## 2022-12-16 ENCOUNTER — Inpatient Hospital Stay: Payer: Medicare Other | Attending: Radiation Oncology | Admitting: Oncology

## 2022-12-16 ENCOUNTER — Inpatient Hospital Stay
Admission: EM | Admit: 2022-12-16 | Discharge: 2022-12-25 | DRG: 840 | Disposition: A | Discharge status: Home Health | Payer: Medicare Other | Attending: Hospitalist | Admitting: Hospitalist

## 2022-12-16 ENCOUNTER — Emergency Department: Payer: Medicare Other

## 2022-12-16 VITALS — BP 93/50 | HR 82 | Temp 95.6°F | Resp 17 | Ht 66.0 in | Wt 137.0 lb

## 2022-12-16 DIAGNOSIS — Z8616 Personal history of COVID-19: Secondary | ICD-10-CM

## 2022-12-16 DIAGNOSIS — Z888 Allergy status to other drugs, medicaments and biological substances status: Secondary | ICD-10-CM

## 2022-12-16 DIAGNOSIS — Z8546 Personal history of malignant neoplasm of prostate: Secondary | ICD-10-CM | POA: Insufficient documentation

## 2022-12-16 DIAGNOSIS — B9620 Unspecified Escherichia coli [E. coli] as the cause of diseases classified elsewhere: Secondary | ICD-10-CM | POA: Diagnosis present

## 2022-12-16 DIAGNOSIS — E43 Unspecified severe protein-calorie malnutrition: Secondary | ICD-10-CM | POA: Diagnosis present

## 2022-12-16 DIAGNOSIS — R768 Other specified abnormal immunological findings in serum: Secondary | ICD-10-CM

## 2022-12-16 DIAGNOSIS — Z9221 Personal history of antineoplastic chemotherapy: Secondary | ICD-10-CM

## 2022-12-16 DIAGNOSIS — N529 Male erectile dysfunction, unspecified: Secondary | ICD-10-CM | POA: Diagnosis present

## 2022-12-16 DIAGNOSIS — Z8551 Personal history of malignant neoplasm of bladder: Secondary | ICD-10-CM

## 2022-12-16 DIAGNOSIS — I7143 Infrarenal abdominal aortic aneurysm, without rupture: Secondary | ICD-10-CM | POA: Diagnosis present

## 2022-12-16 DIAGNOSIS — H409 Unspecified glaucoma: Secondary | ICD-10-CM | POA: Diagnosis present

## 2022-12-16 DIAGNOSIS — Z87891 Personal history of nicotine dependence: Secondary | ICD-10-CM

## 2022-12-16 DIAGNOSIS — Z951 Presence of aortocoronary bypass graft: Secondary | ICD-10-CM

## 2022-12-16 DIAGNOSIS — D631 Anemia in chronic kidney disease: Secondary | ICD-10-CM | POA: Diagnosis present

## 2022-12-16 DIAGNOSIS — N189 Chronic kidney disease, unspecified: Principal | ICD-10-CM

## 2022-12-16 DIAGNOSIS — E785 Hyperlipidemia, unspecified: Secondary | ICD-10-CM | POA: Diagnosis present

## 2022-12-16 DIAGNOSIS — Z923 Personal history of irradiation: Secondary | ICD-10-CM

## 2022-12-16 DIAGNOSIS — E8721 Acute metabolic acidosis: Secondary | ICD-10-CM | POA: Diagnosis present

## 2022-12-16 DIAGNOSIS — N138 Other obstructive and reflux uropathy: Secondary | ICD-10-CM | POA: Diagnosis present

## 2022-12-16 DIAGNOSIS — D649 Anemia, unspecified: Secondary | ICD-10-CM

## 2022-12-16 DIAGNOSIS — Z79899 Other long term (current) drug therapy: Secondary | ICD-10-CM

## 2022-12-16 DIAGNOSIS — E875 Hyperkalemia: Secondary | ICD-10-CM | POA: Diagnosis present

## 2022-12-16 DIAGNOSIS — D630 Anemia in neoplastic disease: Secondary | ICD-10-CM | POA: Diagnosis present

## 2022-12-16 DIAGNOSIS — I723 Aneurysm of iliac artery: Secondary | ICD-10-CM | POA: Diagnosis present

## 2022-12-16 DIAGNOSIS — N1330 Unspecified hydronephrosis: Secondary | ICD-10-CM | POA: Diagnosis present

## 2022-12-16 DIAGNOSIS — R634 Abnormal weight loss: Secondary | ICD-10-CM

## 2022-12-16 DIAGNOSIS — E871 Hypo-osmolality and hyponatremia: Secondary | ICD-10-CM

## 2022-12-16 DIAGNOSIS — R5383 Other fatigue: Secondary | ICD-10-CM | POA: Insufficient documentation

## 2022-12-16 DIAGNOSIS — E861 Hypovolemia: Secondary | ICD-10-CM | POA: Diagnosis present

## 2022-12-16 DIAGNOSIS — Y842 Radiological procedure and radiotherapy as the cause of abnormal reaction of the patient, or of later complication, without mention of misadventure at the time of the procedure: Secondary | ICD-10-CM | POA: Diagnosis present

## 2022-12-16 DIAGNOSIS — I129 Hypertensive chronic kidney disease with stage 1 through stage 4 chronic kidney disease, or unspecified chronic kidney disease: Secondary | ICD-10-CM | POA: Diagnosis present

## 2022-12-16 DIAGNOSIS — Z6822 Body mass index (BMI) 22.0-22.9, adult: Secondary | ICD-10-CM

## 2022-12-16 DIAGNOSIS — Z8042 Family history of malignant neoplasm of prostate: Secondary | ICD-10-CM

## 2022-12-16 DIAGNOSIS — N179 Acute kidney failure, unspecified: Secondary | ICD-10-CM | POA: Diagnosis present

## 2022-12-16 DIAGNOSIS — R310 Gross hematuria: Secondary | ICD-10-CM | POA: Diagnosis not present

## 2022-12-16 DIAGNOSIS — C9000 Multiple myeloma not having achieved remission: Secondary | ICD-10-CM | POA: Diagnosis present

## 2022-12-16 DIAGNOSIS — Z8249 Family history of ischemic heart disease and other diseases of the circulatory system: Secondary | ICD-10-CM

## 2022-12-16 DIAGNOSIS — E870 Hyperosmolality and hypernatremia: Secondary | ICD-10-CM | POA: Diagnosis not present

## 2022-12-16 DIAGNOSIS — N401 Enlarged prostate with lower urinary tract symptoms: Secondary | ICD-10-CM | POA: Diagnosis present

## 2022-12-16 DIAGNOSIS — I720 Aneurysm of carotid artery: Secondary | ICD-10-CM | POA: Diagnosis present

## 2022-12-16 DIAGNOSIS — I2510 Atherosclerotic heart disease of native coronary artery without angina pectoris: Secondary | ICD-10-CM | POA: Diagnosis present

## 2022-12-16 DIAGNOSIS — Z7982 Long term (current) use of aspirin: Secondary | ICD-10-CM

## 2022-12-16 DIAGNOSIS — N184 Chronic kidney disease, stage 4 (severe): Secondary | ICD-10-CM | POA: Diagnosis present

## 2022-12-16 DIAGNOSIS — Z9079 Acquired absence of other genital organ(s): Secondary | ICD-10-CM

## 2022-12-16 DIAGNOSIS — R7989 Other specified abnormal findings of blood chemistry: Secondary | ICD-10-CM | POA: Insufficient documentation

## 2022-12-16 DIAGNOSIS — I959 Hypotension, unspecified: Secondary | ICD-10-CM | POA: Diagnosis not present

## 2022-12-16 DIAGNOSIS — R5381 Other malaise: Secondary | ICD-10-CM | POA: Diagnosis not present

## 2022-12-16 DIAGNOSIS — N3041 Irradiation cystitis with hematuria: Secondary | ICD-10-CM | POA: Diagnosis present

## 2022-12-16 DIAGNOSIS — Z860101 Personal history of adenomatous and serrated colon polyps: Secondary | ICD-10-CM

## 2022-12-16 DIAGNOSIS — I700 Atherosclerosis of aorta: Secondary | ICD-10-CM | POA: Diagnosis present

## 2022-12-16 DIAGNOSIS — I452 Bifascicular block: Secondary | ICD-10-CM | POA: Diagnosis present

## 2022-12-16 LAB — COMPREHENSIVE METABOLIC PANEL
ALT: 13 U/L (ref 0–44)
ALT: 12 U/L (ref 0–44)
AST: 8 U/L — ABNORMAL LOW (ref 15–41)
AST: 9 U/L — ABNORMAL LOW (ref 15–41)
Albumin: 3.5 g/dL (ref 3.5–5.0)
Albumin: 3.2 g/dL — ABNORMAL LOW (ref 3.5–5.0)
Alkaline Phosphatase: 67 U/L (ref 38–126)
Alkaline Phosphatase: 70 U/L (ref 38–126)
Anion gap: 13 (ref 5–15)
Anion gap: 10 (ref 5–15)
BUN: 144 mg/dL — ABNORMAL HIGH (ref 8–23)
BUN: 133 mg/dL — ABNORMAL HIGH (ref 8–23)
CO2: 11 mmol/L — ABNORMAL LOW (ref 22–32)
CO2: 12 mmol/L — ABNORMAL LOW (ref 22–32)
Calcium: 9.1 mg/dL (ref 8.9–10.3)
Calcium: 8.7 mg/dL — ABNORMAL LOW (ref 8.9–10.3)
Chloride: 100 mmol/L (ref 98–111)
Chloride: 99 mmol/L (ref 98–111)
Creatinine, Ser: 9 mg/dL — ABNORMAL HIGH (ref 0.61–1.24)
Creatinine, Ser: 9.17 mg/dL — ABNORMAL HIGH (ref 0.61–1.24)
GFR, Estimated: 6 mL/min — ABNORMAL LOW (ref >60)
GFR, Estimated: 5 mL/min — ABNORMAL LOW (ref >60)
Glucose, Bld: 124 mg/dL — ABNORMAL HIGH (ref 70–99)
Glucose, Bld: 136 mg/dL — ABNORMAL HIGH (ref 70–99)
Potassium: 6 mmol/L — ABNORMAL HIGH (ref 3.5–5.1)
Potassium: 5.7 mmol/L — ABNORMAL HIGH (ref 3.5–5.1)
Sodium: 124 mmol/L — ABNORMAL LOW (ref 135–145)
Sodium: 121 mmol/L — ABNORMAL LOW (ref 135–145)
Total Bilirubin: 0.5 mg/dL (ref 0.3–1.2)
Total Bilirubin: 0.7 mg/dL (ref 0.3–1.2)
Total Protein: 8.9 g/dL — ABNORMAL HIGH (ref 6.5–8.1)
Total Protein: 8.7 g/dL — ABNORMAL HIGH (ref 6.5–8.1)

## 2022-12-16 LAB — URINALYSIS, ROUTINE W REFLEX MICROSCOPIC
Bacteria, UA: NONE SEEN
RBC / HPF: >50 RBC/hpf (ref 0–5)
Squamous Epithelial / HPF: 0 /[HPF] (ref 0–5)
WBC, UA: >50 WBC/hpf (ref 0–5)

## 2022-12-16 LAB — RETICULOCYTES
Immature Retic Fract: 6.9 % (ref 2.3–15.9)
RBC.: 2.82 MIL/uL — ABNORMAL LOW (ref 4.22–5.81)
Retic Count, Absolute: 78.1 10*3/uL (ref 19.0–186.0)
Retic Ct Pct: 2.8 % (ref 0.4–3.1)

## 2022-12-16 LAB — OSMOLALITY, URINE: Osmolality, Ur: 317 mosm/kg (ref 300–900)

## 2022-12-16 LAB — TSH: TSH: 5.667 u[IU]/mL — ABNORMAL HIGH (ref 0.350–4.500)

## 2022-12-16 LAB — CBC WITH DIFFERENTIAL/PLATELET
Abs Immature Granulocytes: 0.24 10*3/uL — ABNORMAL HIGH (ref 0.00–0.07)
Abs Immature Granulocytes: 0.06 10*3/uL (ref 0.00–0.07)
Basophils Absolute: 0 10*3/uL (ref 0.0–0.1)
Basophils Absolute: 0 10*3/uL (ref 0.0–0.1)
Basophils Relative: 0 %
Basophils Relative: 0 %
Eosinophils Absolute: 0 10*3/uL (ref 0.0–0.5)
Eosinophils Absolute: 0 10*3/uL (ref 0.0–0.5)
Eosinophils Relative: 0 %
Eosinophils Relative: 0 %
HCT: 27.6 % — ABNORMAL LOW (ref 39.0–52.0)
HCT: 28.1 % — ABNORMAL LOW (ref 39.0–52.0)
Hemoglobin: 9 g/dL — ABNORMAL LOW (ref 13.0–17.0)
Hemoglobin: 9.3 g/dL — ABNORMAL LOW (ref 13.0–17.0)
Immature Granulocytes: 2 %
Immature Granulocytes: 1 %
Lymphocytes Relative: 6 %
Lymphocytes Relative: 4 %
Lymphs Abs: 0.8 10*3/uL (ref 0.7–4.0)
Lymphs Abs: 0.5 10*3/uL — ABNORMAL LOW (ref 0.7–4.0)
MCH: 32.1 pg (ref 26.0–34.0)
MCH: 32.1 pg (ref 26.0–34.0)
MCHC: 32.6 g/dL (ref 30.0–36.0)
MCHC: 33.1 g/dL (ref 30.0–36.0)
MCV: 98.6 fL (ref 80.0–100.0)
MCV: 96.9 fL (ref 80.0–100.0)
Monocytes Absolute: 1.1 10*3/uL — ABNORMAL HIGH (ref 0.1–1.0)
Monocytes Absolute: 1.1 10*3/uL — ABNORMAL HIGH (ref 0.1–1.0)
Monocytes Relative: 8 %
Monocytes Relative: 9 %
Neutro Abs: 12 10*3/uL — ABNORMAL HIGH (ref 1.7–7.7)
Neutro Abs: 11 10*3/uL — ABNORMAL HIGH (ref 1.7–7.7)
Neutrophils Relative %: 84 %
Neutrophils Relative %: 86 %
Platelets: 332 10*3/uL (ref 150–400)
Platelets: 327 10*3/uL (ref 150–400)
RBC: 2.8 MIL/uL — ABNORMAL LOW (ref 4.22–5.81)
RBC: 2.9 MIL/uL — ABNORMAL LOW (ref 4.22–5.81)
RDW: 13.8 % (ref 11.5–15.5)
RDW: 13.8 % (ref 11.5–15.5)
WBC: 14.2 10*3/uL — ABNORMAL HIGH (ref 4.0–10.5)
WBC: 12.7 10*3/uL — ABNORMAL HIGH (ref 4.0–10.5)
nRBC: 0 % (ref 0.0–0.2)
nRBC: 0 % (ref 0.0–0.2)

## 2022-12-16 LAB — LACTATE DEHYDROGENASE: LDH: 97 U/L — ABNORMAL LOW (ref 98–192)

## 2022-12-16 LAB — IRON AND TIBC
Iron: 88 ug/dL (ref 45–182)
Saturation Ratios: 43 % — ABNORMAL HIGH (ref 17.9–39.5)
TIBC: 207 ug/dL — ABNORMAL LOW (ref 250–450)
UIBC: 119 ug/dL

## 2022-12-16 LAB — FERRITIN: Ferritin: 454 ng/mL — ABNORMAL HIGH (ref 24–336)

## 2022-12-16 LAB — MAGNESIUM: Magnesium: 3.2 mg/dL — ABNORMAL HIGH (ref 1.7–2.4)

## 2022-12-16 LAB — FOLATE: Folate: >40 ng/mL (ref >5.9)

## 2022-12-16 LAB — SODIUM, URINE, RANDOM: Sodium, Ur: 66 mmol/L

## 2022-12-16 MED ORDER — ATORVASTATIN CALCIUM 10 MG PO TABS
10.0000 mg | ORAL_TABLET | Freq: Every day | ORAL | Status: DC
  Administered 2022-12-17 – 2022-12-25 (×9): 10 mg via ORAL
  Filled 2022-12-16 (×10): qty 1

## 2022-12-16 MED ORDER — DEXTROSE 50 % IV SOLN
1.0000 | Freq: Once | INTRAVENOUS | Status: AC
  Administered 2022-12-16: 50 mL via INTRAVENOUS
  Filled 2022-12-16: qty 50

## 2022-12-16 MED ORDER — ASPIRIN 81 MG PO TBEC
81.0000 mg | DELAYED_RELEASE_TABLET | Freq: Every day | ORAL | Status: DC
  Administered 2022-12-17: 81 mg via ORAL
  Filled 2022-12-16 (×2): qty 1

## 2022-12-16 MED ORDER — SODIUM BICARBONATE 8.4 % IV SOLN
INTRAVENOUS | Status: DC
  Filled 2022-12-16 (×3): qty 150
  Filled 2022-12-16 (×4): qty 1000
  Filled 2022-12-16: qty 150
  Filled 2022-12-16 (×2): qty 1000
  Filled 2022-12-16 (×2): qty 150

## 2022-12-16 MED ORDER — FUROSEMIDE 10 MG/ML IJ SOLN
40.0000 mg | Freq: Once | INTRAMUSCULAR | Status: AC
Start: 2022-12-16 — End: 2022-12-16
  Administered 2022-12-16: 40 mg via INTRAVENOUS
  Filled 2022-12-16: qty 4

## 2022-12-16 MED ORDER — INSULIN ASPART 100 UNIT/ML IV SOLN
5.0000 [IU] | Freq: Once | INTRAVENOUS | Status: AC
  Administered 2022-12-16: 5 [IU] via INTRAVENOUS
  Filled 2022-12-16: qty 0.05

## 2022-12-16 MED ORDER — SODIUM CHLORIDE 0.9 % IV BOLUS
1000.0000 mL | Freq: Once | INTRAVENOUS | Status: AC
  Administered 2022-12-17: 1000 mL via INTRAVENOUS

## 2022-12-16 MED ORDER — ALBUTEROL SULFATE (2.5 MG/3ML) 0.083% IN NEBU
10.0000 mg | INHALATION_SOLUTION | Freq: Once | RESPIRATORY_TRACT | Status: AC
  Administered 2022-12-16: 10 mg via RESPIRATORY_TRACT
  Filled 2022-12-16: qty 12

## 2022-12-16 MED ORDER — ENSURE ENLIVE PO LIQD
237.0000 mL | Freq: Two times a day (BID) | ORAL | Status: DC
  Administered 2022-12-17 – 2022-12-24 (×15): 237 mL via ORAL

## 2022-12-16 MED ORDER — FENTANYL CITRATE PF 50 MCG/ML IJ SOSY
50.0000 ug | PREFILLED_SYRINGE | Freq: Once | INTRAMUSCULAR | Status: AC
  Administered 2022-12-16: 50 ug via INTRAVENOUS
  Filled 2022-12-16: qty 1

## 2022-12-16 MED ORDER — SODIUM CHLORIDE 0.9 % IV BOLUS
1000.0000 mL | Freq: Once | INTRAVENOUS | Status: AC
  Administered 2022-12-16: 1000 mL via INTRAVENOUS

## 2022-12-16 NOTE — ED Provider Notes (Signed)
[facility] Provider Note    Event Date/Time   First MD Initiated Contact with Patient 12/16/22 1457     (approximate)   History   Abnormal Lab   HPI  [name] is a 77 year old male with history of HTN, AAA, CAD, prostate and bladder cancer not currently on treatment presenting to the emergency department for evaluation of fatigue.  Patient reports he has had worsening generalized fatigue over the past few weeks.  He saw his primary care doctor who noted that he was anemic and referred him to hematology oncology for further evaluation.  Saw [name] in office earlier today where he had lab work performed.  This demonstrated multiple derangements including a creatinine of 9, sodium of 124, potassium of 6.  Patient was directed to the ER for further evaluation.  Denies fevers, chest pain, shortness of breath.  Reports occasional vomiting and diarrhea, denies abdominal pain.  No dysuria or increased urinary frequency.  Has not noticed any decreased urine production.  Has been getting hyperbaric oxygen treatment for bladder wound from his prior radiation several years ago.      Physical Exam   Triage Vital Signs: ED Triage Vitals  Encounter Vitals Group     BP 12/16/22 1456 (!) 113/47     Systolic BP Percentile --      Diastolic BP Percentile --      Pulse Rate 12/16/22 1456 62     Resp 12/16/22 1456 19     Temp 12/16/22 1452 97.7 F (36.5 C)     Temp Source 12/16/22 1452 Axillary     SpO2 12/16/22 1456 97 %     Weight --      Height --      Head Circumference --      Peak Flow --      Pain Score 12/16/22 1452 0     Pain Loc --      Pain Education --      Exclude from Growth Chart --     Most recent vital signs: Vitals:   12/16/22 1530 12/16/22 1600  BP: 97/70 (!) 118/54  Pulse: (!) 51 62  Resp: 15 12  Temp:    SpO2: 99% 99%     General: Awake, interactive  CV:  Regular rate, good peripheral perfusion.  Resp:  Unlabored  respirations, lungs clear to auscultation Abd:  Soft, nontender, nondistended  Neuro:  Symmetric facial movement, fluid speech   ED Results / Procedures / Treatments   Labs (all labs ordered are listed, but only abnormal results are displayed) Labs Reviewed  CBC WITH DIFFERENTIAL/PLATELET - Abnormal; Notable for the following components:      Result Value   WBC 12.7 (*)    RBC 2.90 (*)    Hemoglobin 9.3 (*)    HCT 28.1 (*)    Neutro Abs 11.0 (*)    Lymphs Abs 0.5 (*)    Monocytes Absolute 1.1 (*)    All other components within normal limits  COMPREHENSIVE METABOLIC PANEL - Abnormal; Notable for the following components:   Sodium 121 (*)    Potassium 5.7 (*)    CO2 12 (*)    Glucose, Bld 136 (*)    BUN 133 (*)    Creatinine, Ser 9.17 (*)    Calcium 8.7 (*)    Total Protein 8.7 (*)    Albumin 3.2 (*)    AST 9 (*)    GFR, Estimated 5 (*)  All other components within normal limits  MAGNESIUM - Abnormal; Notable for the following components:   Magnesium 3.2 (*)    All other components within normal limits  SODIUM, URINE, RANDOM  OSMOLALITY, URINE  OSMOLALITY  URINALYSIS, ROUTINE W REFLEX MICROSCOPIC     EKG EKG independently reviewed interpreted by myself (ER attending) demonstrates:  EKG demonstrates suspected sinus rhythm though somewhat difficult to identify P waves due to artifact with right bundle branch block morphology at a rate of 56, QRS 146, QTc 407, no appreciable acute ST changes  RADIOLOGY Imaging independently reviewed and interpreted by myself demonstrates:    PROCEDURES:  Critical Care performed: Yes, see critical care procedure note(s).  CRITICAL CARE Performed by: Trinna Post   Total critical care time: 38 minutes  Critical care time was exclusive of separately billable procedures and treating other patients.  Critical care was necessary to treat or prevent imminent or life-threatening deterioration.  Critical care was time spent personally  by me on the following activities: development of treatment plan with patient and/or surrogate as well as nursing, discussions with consultants, evaluation of patient's response to treatment, examination of patient, obtaining history from patient or surrogate, ordering and performing treatments and interventions, ordering and review of laboratory studies, ordering and review of radiographic studies, pulse oximetry and re-evaluation of patient's condition.   Procedures   MEDICATIONS ORDERED IN ED: Medications  furosemide (LASIX) injection 40 mg (has no administration in time range)  sodium chloride 0.9 % bolus 1,000 mL (has no administration in time range)  albuterol (PROVENTIL) (2.5 MG/3ML) 0.083% nebulizer solution 10 mg (has no administration in time range)  insulin aspart (novoLOG) injection 5 Units (has no administration in time range)    And  dextrose 50 % solution 50 mL (has no administration in time range)  sodium bicarbonate 150 mEq in dextrose 5 % 1,150 mL infusion (has no administration in time range)     IMPRESSION / MDM / ASSESSMENT AND PLAN / ED COURSE  I reviewed the triage vital signs and the nursing notes.  Differential diagnosis includes, but is not limited to, acute renal failure secondary to urinary retention, recurrent or new malignancy, acute infection  Patient's presentation is most consistent with acute presentation with potential threat to life or bodily function.  77 year old male presenting with weakness found to have multiple derangements on outpatient labs.  Will repeat labs here to confirm. Discussed with [name] with heme-oncology. He has an ongoing myeloma workup and will plan for bone marrow biopsy tomorrow, possible PET scan as an inpatient, but no plans for treatment or further recommendations until a diagnosis has been made.   Clinical Course as of 12/17/22 0016  Tue Dec 16, 2022  1609 Bladder scan 284cc. Case reviewed with Dr. Wynelle Link with nephrology,  who had already been contacted by [name].  He recommended initiation of a bicarb drip, renal ultrasound, fluids, Foley catheter placement, temporizing treatment for his hyperkalemia which have been ordered.  Plan for medicine admission. [NR]  1619 Labs here prior to pretreatment with improved potassium, but slightly worsened renal function and worsening hyponatremia.  Will reach out to hospitalist team to discuss admission. [NR]  1626 Reviewed with hospitalist team.  They will evaluate the patient for anticipated admission. [NR]    Clinical Course User Index [NR] Trinna Post, MD     FINAL CLINICAL IMPRESSION(S) / ED DIAGNOSES   Final diagnoses:  Acute renal failure superimposed on chronic kidney disease, unspecified acute renal failure  type, unspecified CKD stage (HCC)  Hyponatremia  Hyperkalemia  Anemia, unspecified type     Rx / DC Orders   ED Discharge Orders     None        Note:  This document was prepared using Dragon voice recognition software and may include unintentional dictation errors.   Trinna Post, MD 12/17/22 [phone]

## 2022-12-16 NOTE — Telephone Encounter (Signed)
[name] called the pt and let him know that he has creat. 9 and he has to go to the ER to be admitted. Also [name] says no Hyperbaric oxygen therapy for wound at this time and the wound clinic was notified of this.

## 2022-12-16 NOTE — ED Triage Notes (Signed)
Pt arrives via POV. PT reports he was sent to the ED by his hematologist. Pt had labs earlier today, His potassium was 6.0, bun was 144, and creatinine was 9.0. Pt has hx of bladder cancer, currently not undergoing any treatment at this time. Pt is AxOx4. He reports generalized fatigue. Denies other symptoms.

## 2022-12-16 NOTE — Progress Notes (Signed)
Hematology/Oncology Consult note [facility]  [phone] [phone]  Patient Care Team: [name], MD as PCP - General (Internal Medicine) Carmina Miller, MD as Radiation Oncologist (Radiation Oncology)   Name of the patient: [name]  [id number]  1945-03-08   Date of visit: 12/16/22  Diagnosis- 1.  History of superficial bladder cancer  Chief complaint/ Reason for visit-history of superficial bladder cancer now referred for abnormal SPEP  Heme/Onc history: Patient is a 77 year old male with history of superficial bladder cancer was last seen by me in November 2022.  At that time he completed 6 weekly cycles of intravesical gemcitabine chemotherapy and was subsequently followed up by urology.  He has been having concerns of radiation cystitis for which she has been undergoing hyperbaric oxygen therapy.  He followed up with Dr. Wynelle Link from nephrology for CKD when he was found to have a creatinine of 2.3.  At that time he was found to have a creatinine of 2.9 with an H&H of 10.2/31.2.  Possible M protein noted on SPEP and labs also showed elevated lambda light chain of 403 with a free light chain ratio of 0.11   Interval history- Patient feels quite poorly at this time.  He reports significant fatigue and loss of appetite.  He has lost 40 pounds in the last 2 months.  ECOG PS- 3 Pain scale- 0   Review of systems- Review of Systems  Constitutional:  Positive for malaise/fatigue and weight loss.      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 4v CABG on 04/13/2002. b.) LHC 02/03/2013 --> LVEF 50%; severe 3v CAD with patent LIMA-LAD and SVG-OM1 grafts; SVG-PDA and SVG-DG1 occluded with collaterals to PDA and DG1 from LAD.   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   a.) LIMA-LAD, SVG-OM1, SVG-D1, SVG-PDA   Tubular adenoma of colon 02/03/2007     Past Surgical History:  Procedure Laterality Date   CARDIAC CATHETERIZATION Left 04/13/2002   Procedure: CARDIAC CATHETERIZATION; Location: [facility]; Surgeon; Marcina Millard, MD   CARDIAC CATHETERIZATION Left 02/03/2013   Procedure: CARDIAC CATHETERIZATION; Location: [facility]; Surgeon: Marcina Millard, MD   COLONOSCOPY W/ POLYPECTOMY     COLONOSCOPY WITH PROPOFOL N/A 04/09/2015   Procedure: COLONOSCOPY WITH PROPOFOL;  Surgeon: Wallace Cullens, MD;  Location: [facility] ENDOSCOPY;  Service: Gastroenterology;  Laterality: N/A;   CORONARY ANGIOPLASTY     CORONARY ARTERY BYPASS GRAFT N/A 04/13/2002   Procedure: 4v CABG (LIMA-LAD, SVG-OM1,  SVG-D1, SVG-PDA); Location: [facility]; Surgeon: Rana Snare, MD   CYSTOSCOPY N/A 07/17/2021   Procedure: Bluford Kaufmann;  Surgeon: [name], MD;  Location: [facility] ORS;  Service: Urology;  Laterality: N/A;   CYSTOSCOPY N/A 06/24/2022   Procedure: DIAGNOSTIC CYSTOSCOPY;  Surgeon: [name], MD;  Location: [facility] ORS;  Service: Urology;  Laterality: N/A;   CYSTOSCOPY WITH BIOPSY N/A 07/17/2021   Procedure: CYSTOSCOPY WITH BIOPSY;  Surgeon: [name], MD;  Location: [facility] ORS;  Service: Urology;  Laterality: N/A;   CYSTOSCOPY WITH BIOPSY  N/A 06/24/2022   Procedure: CYSTOSCOPY WITH BLADDER BIOPSY;  Surgeon: [name], MD;  Location: [facility] ORS;  Service: Urology;  Laterality: N/A;   CYSTOSCOPY WITH FULGERATION N/A 07/17/2021   Procedure: CYSTOSCOPY WITH FULGERATION;  Surgeon: [name], MD;  Location: [facility] ORS;  Service: Urology;  Laterality: N/A;   CYSTOSCOPY WITH FULGERATION N/A 06/24/2022   Procedure: CYSTOSCOPY WITH FULGERATION;  Surgeon: [name], MD;  Location: [facility] ORS;  Service: Urology;  Laterality: N/A;   EYE SURGERY     both eyes with cataract with lens 2021   TONSILLECTOMY     TRANSURETHRAL RESECTION OF BLADDER TUMOR N/A 11/13/2020   Procedure: TRANSURETHRAL RESECTION OF BLADDER TUMOR (TURBT);  Surgeon: [name], MD;  Location: [facility] ORS;  Service: Urology;  Laterality: N/A;   TRANSURETHRAL RESECTION OF BLADDER TUMOR N/A 06/24/2022   Procedure: TRANSURETHRAL RESECTION OF BLADDER TUMOR (TURBT);  Surgeon: [name], MD;  Location: [facility] ORS;  Service: Urology;  Laterality: N/A;   TRANSURETHRAL RESECTION OF BLADDER TUMOR WITH GYRUS (TURBT-GYRUS)  06/24/2022   TRANSURETHRAL RESECTION OF PROSTATE      Social History   Socioeconomic History   Marital status: Married    Spouse name: [name]   Number of children: Not on file   Years of education: Not on file   Highest education level: Not on file  Occupational History   Not on file  Tobacco Use   Smoking status: Former    Current packs/day: 0.00    Average packs/day: 0.8 packs/day for 60.0 years (45.0 ttl pk-yrs)    Types: Cigarettes    Start date: 49    Quit date: 2008    Years since quitting: 16.8    Passive exposure: Past   Smokeless tobacco: Never  Substance and Sexual Activity   Alcohol use: Yes    Alcohol/week: 2.0 standard drinks of alcohol    Types: 2 Standard drinks or equivalent per week    Comment: 14/week   Drug use: No   Sexual activity: Not on file  Other Topics Concern   Not on file  Social History  Narrative   Not on file   Social Determinants of Health   Financial Resource Strain: Not on file  Food Insecurity: No Food Insecurity (04/17/2022)   Hunger Vital Sign    Worried About Running Out of Food in the Last Year: Never true    Ran Out of Food in the Last Year: Never true  Transportation Needs: No Transportation Needs (04/17/2022)   PRAPARE - Administrator, Civil Service (Medical): No    Lack of Transportation (Non-Medical): No  Physical Activity: Not on file  Stress: Not on file  Social Connections: Not on file  Intimate Partner Violence: Not At Risk (04/17/2022)   Humiliation, Afraid, Rape, and Kick questionnaire    Fear of Current or Ex-Partner: No    Emotionally Abused: No    Physically Abused: No  Sexually Abused: No    Family History  Problem Relation Age of Onset   Prostate cancer Father        Metastatic   Cancer Father     No current facility-administered medications for this visit. No current outpatient medications on file.  Facility-Administered Medications Ordered in Other Visits:    aspirin EC tablet 81 mg, 81 mg, Oral, Daily, Djan, Scarlette Calico, MD   atorvastatin (LIPITOR) tablet 10 mg, 10 mg, Oral, Daily, Djan, Scarlette Calico, MD   sodium bicarbonate 150 mEq in dextrose 5 % 1,150 mL infusion, , Intravenous, Continuous, Ray, Neha, MD, Last Rate: 75 mL/hr at 12/16/22 1659, New Bag at 12/16/22 1659   sodium chloride 0.9 % bolus 1,000 mL, 1,000 mL, Intravenous, Once, Loyce Dys, MD  Physical exam:  Vitals:   12/16/22 1128  BP: (!) 93/50  Pulse: 82  Resp: 17  Temp: (!) 95.6 F (35.3 C)  TempSrc: Tympanic  SpO2: 98%  Weight: 137 lb (62.1 kg)  Height: 5\' 6"  (1.676 m)   Physical Exam Constitutional:      Comments: Sitting in a wheelchair and appears fatigued  Cardiovascular:     Rate and Rhythm: Normal rate and regular rhythm.     Heart sounds: Normal heart sounds.  Pulmonary:     Effort: Pulmonary effort is normal.     Breath sounds:  Normal breath sounds.  Abdominal:     General: Bowel sounds are normal.     Palpations: Abdomen is soft.  Musculoskeletal:     Right lower leg: No edema.     Left lower leg: No edema.  Skin:    General: Skin is warm and dry.  Neurological:     Mental Status: He is alert and oriented to person, place, and time.         Latest Ref Rng & Units 12/16/2022    3:13 PM  CMP  Glucose 70 - 99 mg/dL 710   BUN 8 - 23 mg/dL 626   Creatinine 9.48 - 1.24 mg/dL 5.46   Sodium 270 - 350 mmol/L 121   Potassium 3.5 - 5.1 mmol/L 5.7   Chloride 98 - 111 mmol/L 99   CO2 22 - 32 mmol/L 12   Calcium 8.9 - 10.3 mg/dL 8.7   Total Protein 6.5 - 8.1 g/dL 8.7   Total Bilirubin 0.3 - 1.2 mg/dL 0.7   Alkaline Phos 38 - 126 U/L 70   AST 15 - 41 U/L 9   ALT 0 - 44 U/L 12       Latest Ref Rng & Units 12/16/2022    3:13 PM  CBC  WBC 4.0 - 10.5 K/uL 12.7   Hemoglobin 13.0 - 17.0 g/dL 9.3   Hematocrit 09.3 - 52.0 % 28.1   Platelets 150 - 400 K/uL 327     No images are attached to the encounter.  No results found.   Assessment and plan- Patient is a 77 y.o. male with prior history of superficial bladder cancer now referred for normocytic anemia  Patient's hemoglobin back in November 2022 was 14.9 and over the last 8 to 9 months has drifted down between 9-10.  Lambda light chain significantly elevated at 430 with a free light chain ratio 0.11.  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  My plan was to get a complete anemia workup today in the office including CBC with differential CMP myeloma panel serum free light chains as well as outpatient PET scan and  bone marrow biopsy ASAP.  After the patient left the clinic his CMP came back with a creatinine of 9 and a potassium of 6 which was significantly elevated as compared to his baseline creatinine of 2.5.  I have therefore asked the patient to go to the ER for further workup of possible myeloma.  Nephrology needs to be involved to manage his renal  functions until his diagnosis is confirmed.  Myeloma labs have already been drawn in the clinic and I will plan to get bone marrow biopsy as an inpatient tomorrow.  He will also need inpatient PET scan to expedite his workup   Visit Diagnosis 1. Free immunoglobulin light chain above reference range   2. Normocytic anemia   3. Loss of weight      Dr. Owens Shark, MD, MPH [facility] [phone] 12/16/2022 7:11 PM

## 2022-12-16 NOTE — ED Notes (Signed)
Basic labs and a blue top sent down to lab with "save tube" labels.

## 2022-12-16 NOTE — H&P (Signed)
History and Physical    Patient: [name] HYQ:[id number] DOB: 02/10/1946 DOA: 12/16/2022 DOS: the patient was seen and examined on 12/16/2022 PCP: Jake Nurse, MD  Patient coming from: Home  Chief Complaint:  Chief Complaint  Patient presents with   Abnormal Lab   HPI: [name] is a 77 y.o. male with medical history significant of hypertension, hyperlipidemia, coronary artery disease status post CABG, history of bladder cancer and prostate cancer s/p radiation who follows up with urology closely.  Patient presents to the emergency room today after he was called to come to the emergency room by hematology [name] on account of having worsening renal function and hyperkalemia.  According to patient in August he followed up with his primary care physician and he was found to have worsening anemia and therefore referred to hematology for further management.  Patient saw hematologist today [name] and underwent some blood work and was later called to come to the emergency room on account of the abnormal labs.  At the hematologist office patient was found to have creatinine of 9 sodium 124 and potassium of 6. According to patient he has been having associated generalized weakness, about 70 pounds weight loss within the last 1 year, and loss of appetite.  He denies nausea vomiting abdominal pain chest pain cough diarrhea constipation or urinary complaints  ED course: Upon arrival to the emergency room patient was found to have temperature 95.6 respiratory rate is 17 pulse 82 blood pressure 93/50 saturating in the mid 90s on room air. Blood work showed sodium 121 potassium 5.7 creatinine 9.17 and bicarb 12.  WBC 12.7 hemoglobin 9.3 platelets of 327. ED physician discussed with [name] as well as nephrologist and then consulted hospitalist for admission for further management.  Review of Systems: As mentioned in the history of present illness. All other systems reviewed and are  negative. 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 4v CABG on 04/13/2002. b.) LHC 02/03/2013 --> LVEF 50%; severe 3v CAD with patent LIMA-LAD and SVG-OM1 grafts; SVG-PDA and SVG-DG1 occluded with collaterals to PDA and DG1 from LAD.   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   a.) LIMA-LAD, SVG-OM1, SVG-D1, SVG-PDA   Tubular adenoma of colon 02/03/2007   Past Surgical History:  Procedure Laterality Date   CARDIAC CATHETERIZATION Left 04/13/2002   Procedure: CARDIAC CATHETERIZATION; Location: [facility]; Surgeon; Marcina Millard, MD   CARDIAC CATHETERIZATION Left 02/03/2013   Procedure: CARDIAC CATHETERIZATION; Location: [facility]; Surgeon: Marcina Millard, MD   COLONOSCOPY W/ POLYPECTOMY     COLONOSCOPY WITH PROPOFOL N/A 04/09/2015   Procedure: COLONOSCOPY WITH PROPOFOL;  Surgeon: Ezzard Standing  Bluford Kaufmann, MD;  Location: [facility] ENDOSCOPY;  Service: Gastroenterology;  Laterality: N/A;   CORONARY ANGIOPLASTY     CORONARY ARTERY BYPASS GRAFT N/A 04/13/2002   Procedure: 4v CABG (LIMA-LAD, SVG-OM1, SVG-D1, SVG-PDA); Location: [facility]; Surgeon: Rana Snare, MD   CYSTOSCOPY N/A 07/17/2021   Procedure: Bluford Kaufmann;  Surgeon: [name], MD;  Location: [facility] ORS;  Service: Urology;  Laterality: N/A;   CYSTOSCOPY N/A 06/24/2022   Procedure:  DIAGNOSTIC CYSTOSCOPY;  Surgeon: [name], MD;  Location: [facility] ORS;  Service: Urology;  Laterality: N/A;   CYSTOSCOPY WITH BIOPSY N/A 07/17/2021   Procedure: CYSTOSCOPY WITH BIOPSY;  Surgeon: [name], MD;  Location: [facility] ORS;  Service: Urology;  Laterality: N/A;   CYSTOSCOPY WITH BIOPSY N/A 06/24/2022   Procedure: CYSTOSCOPY WITH BLADDER BIOPSY;  Surgeon: [name], MD;  Location: [facility] ORS;  Service: Urology;  Laterality: N/A;   CYSTOSCOPY WITH FULGERATION N/A 07/17/2021   Procedure: CYSTOSCOPY WITH FULGERATION;  Surgeon: [name], MD;  Location: [facility] ORS;  Service: Urology;  Laterality: N/A;   CYSTOSCOPY WITH FULGERATION N/A 06/24/2022   Procedure: CYSTOSCOPY WITH FULGERATION;  Surgeon: [name], MD;  Location: [facility] ORS;  Service: Urology;  Laterality: N/A;   EYE SURGERY     both eyes with cataract with lens 2021   TONSILLECTOMY     TRANSURETHRAL RESECTION OF BLADDER TUMOR N/A 11/13/2020   Procedure: TRANSURETHRAL RESECTION OF BLADDER TUMOR (TURBT);  Surgeon: [name], MD;  Location: [facility] ORS;  Service: Urology;  Laterality: N/A;   TRANSURETHRAL RESECTION OF BLADDER TUMOR N/A 06/24/2022   Procedure: TRANSURETHRAL RESECTION OF BLADDER TUMOR (TURBT);  Surgeon: [name], MD;  Location: [facility] ORS;  Service: Urology;  Laterality: N/A;   TRANSURETHRAL RESECTION OF BLADDER TUMOR WITH GYRUS (TURBT-GYRUS)  06/24/2022   TRANSURETHRAL RESECTION OF PROSTATE     Social History:  reports that he quit smoking about 16 years ago. His smoking use included cigarettes. He started smoking about 76 years ago. He has a 45 pack-year smoking history. He has been exposed to tobacco smoke. He has never used smokeless tobacco. He reports current alcohol use of about 2.0 standard drinks of alcohol per week. He reports that he does not use drugs.  Allergies  Allergen Reactions   Cidex [Glutaral] Anaphylaxis   Other Anaphylaxis    - Ortho-phthalaldehyde  - trospion cause  AMS    Family History  Problem Relation Age of Onset   Prostate cancer Father        Metastatic   Cancer Father     Prior to Admission medications   Medication Sig Start Date End Date Taking? Authorizing Provider  acetaminophen (TYLENOL) 500 MG tablet Take 500 mg by mouth every 6 (six) hours as needed.    [provider]  aspirin 81 MG tablet Take 81 mg by mouth daily.    [provider]  atorvastatin (LIPITOR) 10 MG tablet Take 10 mg by mouth daily. 04/29/22   [provider]  cetirizine (ZYRTEC) 10 MG tablet Take 10 mg by mouth at bedtime.    [provider]  Coenzyme Q10 100 MG capsule Take 100 mg by mouth daily.     [provider]  ferrous sulfate 324 MG TBEC Take 324 mg by mouth.    [provider]  fluticasone (FLONASE) 50 MCG/ACT nasal spray Place 1 spray into both nostrils as needed.    [provider]  GLUCOSAMINE-CHONDROITIN PO Take 1 tablet by mouth 2 (  two) times daily.    [provider]  HYDROcodone-acetaminophen (NORCO/VICODIN) 5-325 MG tablet Take 1 tablet by mouth every 6 (six) hours as needed for moderate pain. 06/24/22   Stoioff, Verna Czech, MD  isosorbide mononitrate (IMDUR) 30 MG 24 hr tablet Take 30 mg by mouth daily.    [provider]  Magnesium 200 MG TABS Take 200 mg by mouth 2 (two) times daily.    [provider]  metoprolol tartrate (LOPRESSOR) 25 MG tablet Take 25 mg by mouth 2 (two) times daily.    [provider]  metroNIDAZOLE (METROGEL) 0.75 % gel Apply 1 application topically 2 (two) times daily.    [provider]  Multiple Vitamin (MULTIVITAMIN) tablet Take 1 tablet by mouth daily.    [provider]  nitroGLYCERIN (NITROSTAT) 0.4 MG SL tablet Place under the tongue. 08/21/21   [provider]  Omega-3 Fatty Acids (FISH OIL) 1000 MG CAPS Take 1,000 mg by mouth daily.    [provider]  Potassium Gluconate 595 MG CAPS Take 1  capsule by mouth daily as needed (After HT TR exercise).    [provider]  Probiotic Product (MEGA PROBIOTIC) CAPS Take 1 capsule by mouth daily. 07/20/19   [provider]  silodosin (RAPAFLO) 8 MG CAPS capsule Take 1 capsule (8 mg total) by mouth daily with breakfast. 03/26/22   Carman Ching, PA-C  Vitamin A 2400 MCG (8000 UT) CAPS Take 8,000 Units by mouth daily.    [provider]  vitamin B-12 (CYANOCOBALAMIN) 1000 MCG tablet Take 1,000 mcg by mouth daily.    [provider]  vitamin C (ASCORBIC ACID) 250 MG tablet Take 250 mg by mouth 2 (two) times daily.    [provider]  zinc gluconate 50 MG tablet Take 50 mg by mouth daily.    [provider]    Physical Exam:  General: Laying in bed in no acute distress CVS: S1-S2 present no murmur appreciated HEENT: Normocephalic atraumatic CNS: Alert and oriented x 3 moving all extremities bilaterally nonfocal Respiratory: Clear to auscultation bilaterally Abdomen: Soft nontender no masses palpable GU: Deferred Skin: Area of ecchymosis noted to the left forearm  Vitals:   12/16/22 1456 12/16/22 1514 12/16/22 1530 12/16/22 1600  BP: (!) 113/47  97/70 (!) 118/54  Pulse: 62  (!) 51 62  Resp: 19  15 12   Temp:      TempSrc:      SpO2: 97% 99% 99% 99%    Data Reviewed: I have reviewed patient's lab reports as shown as well as vitals I have also reviewed ED physician documentation and nursing documentation   Assessment and Plan:  Acute renal failure likely in the setting of possible multiple myeloma Patient presented with creatinine of 9 Baseline creatinine of 1.5-2 in the last 4 to 5 months  Elevated free kappa and lambda obtained in September 2024 Given elevated protein gap as well as rapidly worsening renal function in the setting of anemia, this raises high suspicion for multiple myeloma Nephrology on board Hematology planning IR guided bone marrow biopsy tomorrow We  will keep n.p.o. after midnight  Normocytic anemia likely 2 secondary to anemia of chronic disease No indication for blood transfusion at this time Monitor CBC closely  Hyperkalemia in the setting of worsening renal failure Patient received hyperkalemia cocktail in the emergency room We will continue to monitor closely Repeat BMP after therapy this night  Mild leukocytosis No evidence of infection at this  time Continue to monitor closely Antibiotic not indicated at this time  Severe metabolic acidosis secondary to acute renal failure Continue bicarb drip as recommended by nephrology Monitor BMP closely  Hyponatremia likely secondary to hypovolemia Patient received IV fluid bolus in the emergency room We will continue to monitor and bolus as needed based on sodium level on account of the national IV fluid shortage we will avoid continuous IV infusion Monitor electrolytes closely Follow-up on urine electrolytes as well as osmolarity  Hyperlipidemia-continue statin therapy  Hypertension Holding antihypertensives at this time on account of hypotension Monitor blood pressure closely  History of bladder cancer History of prostate cancer Outpatient follow-up with urology  Coronary artery disease status post CABG Continue statin therapy and aspirin   Advance Care Planning:   Code Status: Prior full code Discussed with patient and patient's wife at bedside and they would like to be full code  Consults: Oncology, nephrology  Family Communication: Discussed with patient's wife present at bedside  Severity of Illness: The appropriate patient status for this patient is INPATIENT. Inpatient status is judged to be reasonable and necessary in order to provide the required intensity of service to ensure the patient's safety. The patient's presenting symptoms, physical exam findings, and initial radiographic and laboratory data in the context of their chronic comorbidities is felt to  place them at high risk for further clinical deterioration. Furthermore, it is not anticipated that the patient will be medically stable for discharge from the hospital within 2 midnights of admission.   * I certify that at the point of admission it is my clinical judgment that the patient will require inpatient hospital care spanning beyond 2 midnights from the point of admission due to high intensity of service, high risk for further deterioration and high frequency of surveillance required.*  Author: Loyce Dys, MD 12/16/2022 4:30 PM  For on call review [email or web address].

## 2022-12-17 ENCOUNTER — Encounter: Payer: Medicare Other | Admitting: Internal Medicine

## 2022-12-17 ENCOUNTER — Inpatient Hospital Stay: Payer: Medicare Other

## 2022-12-17 ENCOUNTER — Inpatient Hospital Stay: Payer: Medicare Other | Admitting: Radiology

## 2022-12-17 ENCOUNTER — Ambulatory Visit: Payer: Medicare Other | Admitting: Oncology

## 2022-12-17 ENCOUNTER — Encounter: Payer: Self-pay | Admitting: Internal Medicine

## 2022-12-17 DIAGNOSIS — N179 Acute kidney failure, unspecified: Secondary | ICD-10-CM | POA: Diagnosis not present

## 2022-12-17 HISTORY — PX: IR BONE MARROW BIOPSY & ASPIRATION: IMG5727

## 2022-12-17 LAB — CBC WITH DIFFERENTIAL/PLATELET
Abs Immature Granulocytes: 0.19 10*3/uL — ABNORMAL HIGH (ref 0.00–0.07)
Basophils Absolute: 0 10*3/uL (ref 0.0–0.1)
Basophils Relative: 0 %
Eosinophils Absolute: 0 10*3/uL (ref 0.0–0.5)
Eosinophils Relative: 0 %
HCT: 21.3 % — ABNORMAL LOW (ref 39.0–52.0)
Hemoglobin: 7.4 g/dL — ABNORMAL LOW (ref 13.0–17.0)
Immature Granulocytes: 1 %
Lymphocytes Relative: 2 %
Lymphs Abs: 0.4 10*3/uL — ABNORMAL LOW (ref 0.7–4.0)
MCH: 32.9 pg (ref 26.0–34.0)
MCHC: 34.7 g/dL (ref 30.0–36.0)
MCV: 94.7 fL (ref 80.0–100.0)
Monocytes Absolute: 0.9 10*3/uL (ref 0.1–1.0)
Monocytes Relative: 4 %
Neutro Abs: 22.4 10*3/uL — ABNORMAL HIGH (ref 1.7–7.7)
Neutrophils Relative %: 93 %
Platelets: 258 10*3/uL (ref 150–400)
RBC: 2.25 MIL/uL — ABNORMAL LOW (ref 4.22–5.81)
RDW: 13.7 % (ref 11.5–15.5)
WBC: 23.9 10*3/uL — ABNORMAL HIGH (ref 4.0–10.5)
nRBC: 0 % (ref 0.0–0.2)

## 2022-12-17 LAB — BASIC METABOLIC PANEL
Anion gap: 11 (ref 5–15)
BUN: 132 mg/dL — ABNORMAL HIGH (ref 8–23)
CO2: 13 mmol/L — ABNORMAL LOW (ref 22–32)
Calcium: 8 mg/dL — ABNORMAL LOW (ref 8.9–10.3)
Chloride: 101 mmol/L (ref 98–111)
Creatinine, Ser: 8.43 mg/dL — ABNORMAL HIGH (ref 0.61–1.24)
GFR, Estimated: 6 mL/min — ABNORMAL LOW (ref >60)
Glucose, Bld: 143 mg/dL — ABNORMAL HIGH (ref 70–99)
Potassium: 4.4 mmol/L (ref 3.5–5.1)
Sodium: 125 mmol/L — ABNORMAL LOW (ref 135–145)

## 2022-12-17 LAB — GLUCOSE, CAPILLARY: Glucose-Capillary: 97 mg/dL (ref 70–99)

## 2022-12-17 LAB — VITAMIN B12: Vitamin B-12: 2084 pg/mL — ABNORMAL HIGH (ref 180–914)

## 2022-12-17 LAB — KAPPA/LAMBDA LIGHT CHAINS
Kappa free light chain: 68.2 mg/L — ABNORMAL HIGH (ref 3.3–19.4)
Kappa, lambda light chain ratio: 0.07 — ABNORMAL LOW (ref 0.26–1.65)
Lambda free light chains: 948.1 mg/L — ABNORMAL HIGH (ref 5.7–26.3)

## 2022-12-17 LAB — PROCALCITONIN: Procalcitonin: 3.6 ng/mL

## 2022-12-17 LAB — OSMOLALITY: Osmolality: 324 mosm/kg (ref 275–295)

## 2022-12-17 MED ORDER — FENTANYL CITRATE (PF) 100 MCG/2ML IJ SOLN
INTRAMUSCULAR | Status: AC | PRN
  Administered 2022-12-17: 25 ug via INTRAVENOUS

## 2022-12-17 MED ORDER — HEPARIN SOD (PORK) LOCK FLUSH 100 UNIT/ML IV SOLN
INTRAVENOUS | Status: AC
  Filled 2022-12-17: qty 5

## 2022-12-17 MED ORDER — FENTANYL CITRATE (PF) 100 MCG/2ML IJ SOLN
INTRAMUSCULAR | Status: AC
  Filled 2022-12-17: qty 2

## 2022-12-17 MED ORDER — SODIUM CHLORIDE 0.9 % IV BOLUS
500.0000 mL | Freq: Once | INTRAVENOUS | Status: AC
  Administered 2022-12-17: 500 mL via INTRAVENOUS

## 2022-12-17 MED ORDER — FLUDEOXYGLUCOSE F - 18 (FDG) INJECTION
7.7500 | Freq: Once | INTRAVENOUS | Status: AC | PRN
  Administered 2022-12-17: 7.75 via INTRAVENOUS

## 2022-12-17 MED ORDER — MIDAZOLAM HCL 5 MG/5ML IJ SOLN
INTRAMUSCULAR | Status: AC | PRN
Start: 2022-12-17 — End: 2022-12-17
  Administered 2022-12-17: .5 mg via INTRAVENOUS

## 2022-12-17 MED ORDER — LIDOCAINE 1 % OPTIME INJ - NO CHARGE
8.0000 mL | Freq: Once | INTRAMUSCULAR | Status: AC
  Administered 2022-12-17: 8 mL via INTRADERMAL

## 2022-12-17 MED ORDER — MIDAZOLAM HCL 2 MG/2ML IJ SOLN
INTRAMUSCULAR | Status: AC
  Filled 2022-12-17: qty 2

## 2022-12-17 MED ORDER — CHLORHEXIDINE GLUCONATE CLOTH 2 % EX PADS
6.0000 | MEDICATED_PAD | Freq: Every day | CUTANEOUS | Status: DC
  Administered 2022-12-17 – 2022-12-24 (×8): 6 via TOPICAL

## 2022-12-17 MED ORDER — MORPHINE SULFATE (PF) 2 MG/ML IV SOLN
2.0000 mg | INTRAVENOUS | Status: DC | PRN
  Administered 2022-12-17: 2 mg via INTRAVENOUS
  Filled 2022-12-17: qty 1

## 2022-12-17 MED ORDER — ADULT MULTIVITAMIN W/MINERALS CH
1.0000 | ORAL_TABLET | Freq: Every day | ORAL | Status: DC
  Administered 2022-12-17 – 2022-12-25 (×9): 1 via ORAL
  Filled 2022-12-17 (×9): qty 1

## 2022-12-17 NOTE — TOC Initial Note (Signed)
Transition of Care [facility]) - Initial/Assessment Note    Patient Details  Name: [name] MRN: [id number] Date of Birth: Aug 04, 1945  Transition of Care [facility]) CM/SW Contact:    Chapman Fitch, RN Phone Number: 12/17/2022, 11:34 AM  Clinical Narrative:                  Met with patient and wife at bedside. Patient post bone marrow biopsy and sleeping soundly.   Admitted for: AKI Admitted from: Home with Wife PCP: Letitia Libra - wife provides transportation [facility] Current home health/prior home health/DME: NA  Therapy evals pending    Expected Discharge Plan: Home w Home Health Services     Patient Goals and CMS Choice            Expected Discharge Plan and Services                                              Prior Living Arrangements/Services                       Activities of Daily Living   ADL Screening (condition at time of admission) Independently performs ADLs?: No Does the patient have a NEW difficulty with bathing/dressing/toileting/self-feeding that is expected to last >3 days?: Yes (Initiates electronic notice to provider for possible OT consult) Does the patient have a NEW difficulty with getting in/out of bed, walking, or climbing stairs that is expected to last >3 days?: Yes (Initiates electronic notice to provider for possible PT consult) Does the patient have a NEW difficulty with communication that is expected to last >3 days?: No Is the patient deaf or have difficulty hearing?: No Does the patient have difficulty seeing, even when wearing glasses/contacts?: No Does the patient have difficulty concentrating, remembering, or making decisions?: No  Permission Sought/Granted                  Emotional Assessment              Admission diagnosis:  Hyperkalemia [E87.5] Hyponatremia [E87.1] AKI (acute kidney injury) (HCC) [N17.9] Anemia, unspecified type [D64.9] Acute renal failure superimposed on  chronic kidney disease, unspecified acute renal failure type, unspecified CKD stage (HCC) [N17.9, N18.9] Patient Active Problem List   Diagnosis Date Noted   AKI (acute kidney injury) (HCC) 12/16/2022   Urinary retention 04/17/2022   Hyponatremia 04/17/2022   Acute kidney injury superimposed on chronic kidney disease (HCC) 04/17/2022   Hematuria 04/17/2022   Acute on chronic anemia 04/17/2022   Essential hypertension 04/17/2022   Leg edema, left 04/17/2022   Leukocytosis 04/17/2022   Abdominal aortic aneurysm without rupture (HCC) 09/18/2020   Residual hemorrhoidal skin tags 07/19/2019   FH: macular degeneration 10/05/2018   Elevated PSA 03/11/2018   Family history of prostate cancer in father 02/24/2018   Coronary artery disease involving native heart without angina pectoris 09/02/2017   Personal history of malignant neoplasm of bladder 08/28/2017   Presence of aortocoronary bypass graft 01/16/2014   Postsurgical aortocoronary bypass status 01/16/2014   Rosacea 01/03/2014   Cardiac disease 01/03/2014   Pure hypercholesterolemia 01/03/2014   History of cardiac catheterization 01/03/2014   Malignant neoplasm of bladder, unspecified (HCC) 10/04/2013   Increased frequency of urination 02/07/2013   ED (erectile dysfunction) of organic origin 11/26/2011   Benign prostatic hyperplasia without lower urinary  tract symptoms 11/26/2011   Bilateral renal cysts 11/26/2011   Anaphylaxis 07/24/2011   PCP:  [name], MD Pharmacy:   [facility] [id number] - [place], [place] - [address] [place] [address] Phone: [phone] Fax: [phone]  [id number] - [place], [place] - [address] [place] [address] Phone: [phone] Fax: [phone]  [id number] [place], [place] - [address] AT [facility] OF [address] [place] [address] [place] [address] Phone: [phone] Fax: [phone]     Social  Determinants of Health (SDOH) Social History: SDOH Screenings   Food Insecurity: No Food Insecurity (12/16/2022)  Housing: Low Risk  (12/16/2022)  Transportation Needs: No Transportation Needs (12/16/2022)  Utilities: Not At Risk (12/16/2022)  Tobacco Use: Medium Risk (12/17/2022)   SDOH Interventions:     Readmission Risk Interventions    12/17/2022   11:32 AM  Readmission Risk Prevention Plan  Transportation Screening Complete  Home Care Screening Complete  Medication Review (RN CM) Complete

## 2022-12-17 NOTE — Progress Notes (Signed)
PROGRESS NOTE    [name]  ZDG:[id number] DOB: 1946-01-02 DOA: 12/16/2022 PCP: [name], MD    Brief Narrative:   77 y.o. male with medical history significant of hypertension, hyperlipidemia, coronary artery disease status post CABG, history of bladder cancer and prostate cancer s/p radiation who follows up with urology closely.  Patient presents to the emergency room today after he was called to come to the emergency room by hematology [name] on account of having worsening renal function and hyperkalemia.  According to patient in August he followed up with his primary care physician and he was found to have worsening anemia and therefore referred to hematology for further management.  Patient saw hematologist today [name] and underwent some blood work and was later called to come to the emergency room on account of the abnormal labs.    Assessment & Plan:   Principal Problem:   AKI (acute kidney injury) (HCC)  Acute renal failure likely in the setting of possible multiple myeloma Patient presented with creatinine of 9 Baseline creatinine of 1.5-2 in the last 4 to 5 months  Elevated free kappa and lambda obtained in September 2024 Given elevated protein gap as well as rapidly worsening renal function in the setting of anemia, this raises high suspicion for multiple myeloma Nephrology on board Status post bone marrow biopsy 10/30 Status post inpatient PET scan 10/30 Plan: Can resume diet after PET scan complete.  On bicarbonate drip per nephrology recommendations.  No indication for emergent dialysis.  Oncology following.  Daily renal function.  Normocytic anemia likely 2 secondary to anemia of chronic disease No indication for blood transfusion at this time Monitor CBC closely   Hyperkalemia in the setting of worsening renal failure Patient received hyperkalemia cocktail in the emergency room Potassium has improved   Mild leukocytosis Worsening.  Could be  related underlying plasma cell dyscrasia No clear evidence of infection Continue to monitor closely Antibiotic not indicated at this time Low threshold to start   Severe metabolic acidosis secondary to acute renal failure Continue bicarb drip as recommended by nephrology Monitor BMP closely   Hyponatremia Suspect pseudohyponatremia in the setting of plasma cell dyscrasia As evidenced by hyperosmolarity Plan: IV bicarbonate Avoid saline infusion  Hyperlipidemia PTA statin   Hypertension Hold BP meds at this time   History of bladder cancer History of prostate cancer Outpatient follow-up with urology   Coronary artery disease status post CABG Continue statin therapy and aspirin    DVT prophylaxis: SCDs Code Status: Full Family Communication: Spouse at bedside 10/30 Disposition Plan: Status is: Inpatient Remains inpatient appropriate because: Severe acute renal failure in the setting of suspected plasma cell dyscrasia   Level of care: Telemetry Medical  Consultants:  Nephrology Hematology oncology  Procedures:  Pulmonary biopsy 10/30 CT PET scan 10/30  Antimicrobials: None   Subjective: Seen and examined.  Sleepy this a.m. after receiving medications for bone marrow biopsy.  Wife at bedside.  Objective: Vitals:   12/17/22 1000 12/17/22 1015 12/17/22 1030 12/17/22 1054  BP: (!) 104/52 (!) 94/54 (!) 95/59 (!) 95/57  Pulse: 99 90 92 93  Resp: 13 17 13 16   Temp:    97.6 F (36.4 C)  TempSrc:      SpO2: 95% 95% 96% 96%  Weight:      Height:        Intake/Output Summary (Last 24 hours) at 12/17/2022 1350 Last data filed at 12/17/2022 1020 Gross per 24 hour  Intake  1903.93 ml  Output 1400 ml  Net 503.93 ml   Filed Weights   12/16/22 1900 12/17/22 0826  Weight: 63.8 kg 63.8 kg    Examination:  General exam: Appears calm and comfortable  Respiratory system: Clear to auscultation. Respiratory effort normal. Cardiovascular system: S1-S2, RRR, no  murmurs, no pedal edema Gastrointestinal system: Soft, NT/ND, normal bowel sounds Central nervous system: Sleepy but alert oriented x 3.  No focal deficits Extremities: Symmetric 5 x 5 power. Skin: No rashes, lesions or ulcers Psychiatry: Judgement and insight appear normal. Mood & affect appropriate.     Data Reviewed: I have personally reviewed following labs and imaging studies  CBC: Recent Labs  Lab 12/16/22 1227 12/16/22 1513 12/17/22 0436  WBC 14.2* 12.7* 23.9*  NEUTROABS 12.0* 11.0* 22.4*  HGB 9.0* 9.3* 7.4*  HCT 27.6* 28.1* 21.3*  MCV 98.6 96.9 94.7  PLT 332 327 258   Basic Metabolic Panel: Recent Labs  Lab 12/16/22 1227 12/16/22 1513 12/17/22 0436  NA 124* 121* 125*  K 6.0* 5.7* 4.4  CL 100 99 101  CO2 11* 12* 13*  GLUCOSE 124* 136* 143*  BUN 144* 133* 132*  CREATININE 9.00* 9.17* 8.43*  CALCIUM 9.1 8.7* 8.0*  MG  --  3.2*  --    GFR: Estimated Creatinine Clearance: 6.5 mL/min (A) (by C-G formula based on SCr of 8.43 mg/dL (H)). Liver Function Tests: Recent Labs  Lab 12/16/22 1227 12/16/22 1513  AST 8* 9*  ALT 13 12  ALKPHOS 67 70  BILITOT 0.5 0.7  PROT 8.9* 8.7*  ALBUMIN 3.5 3.2*   No results for input(s): "LIPASE", "AMYLASE" in the last 168 hours. No results for input(s): "AMMONIA" in the last 168 hours. Coagulation Profile: No results for input(s): "INR", "PROTIME" in the last 168 hours. Cardiac Enzymes: No results for input(s): "CKTOTAL", "CKMB", "CKMBINDEX", "TROPONINI" in the last 168 hours. BNP (last 3 results) No results for input(s): "PROBNP" in the last 8760 hours. HbA1C: No results for input(s): "HGBA1C" in the last 72 hours. CBG: Recent Labs  Lab 12/17/22 1147  GLUCAP 97   Lipid Profile: No results for input(s): "CHOL", "HDL", "LDLCALC", "TRIG", "CHOLHDL", "LDLDIRECT" in the last 72 hours. Thyroid Function Tests: Recent Labs    12/16/22 1227  TSH 5.667*   Anemia Panel: Recent Labs    12/16/22 1227  VITAMINB12  2,084*  FOLATE >40.0  FERRITIN 454*  TIBC 207*  IRON 88  RETICCTPCT 2.8   Sepsis Labs: Recent Labs  Lab 12/17/22 0436  PROCALCITON 3.60    No results found for this or any previous visit (from the past 240 hour(s)).       Radiology Studies: IR BONE MARROW BIOPSY & ASPIRATION  Result Date: 12/17/2022 INDICATION: Multiple myeloma workup EXAM: Bone marrow aspiration and core biopsy using fluoroscopic guidance MEDICATIONS: None. ANESTHESIA/SEDATION: Moderate (conscious) sedation was employed during this procedure. A total of Versed 0.5 mg and Fentanyl 25 mcg was administered intravenously. Moderate Sedation Time: 10 minutes. The patient's level of consciousness and vital signs were monitored continuously by radiology nursing throughout the procedure under my direct supervision. FLUOROSCOPY TIME:  Fluoroscopy Time: 0.8 minutes (3 mGy) COMPLICATIONS: None immediate. PROCEDURE: Informed written consent was obtained from the patient after a thorough discussion of the procedural risks, benefits and alternatives. All questions were addressed. Maximal Sterile Barrier Technique was utilized including caps, mask, sterile gowns, sterile gloves, sterile drape, hand hygiene and skin antiseptic. A timeout was performed prior to the initiation of the procedure.  The patient was placed prone on the exam table. Limited fluoroscopy of the pelvis was performed for planning purposes. Skin entry site was marked, and the overlying skin was prepped and draped in the standard sterile fashion. Local analgesia was obtained with 1% lidocaine. Using fluoroscopic guidance, an 11 gauge needle was advanced just deep to the cortex of the right posterior ilium. Subsequently, bone marrow aspiration and core biopsy were performed. Specimens were submitted to lab/pathology for handling. Hemostasis was achieved with manual pressure, and a clean dressing was placed. The patient tolerated the procedure well without immediate  complication. IMPRESSION: Successful bone marrow aspiration and core biopsy of the right posterior ilium using fluoroscopic guidance. Electronically Signed   By: Olive Bass M.D.   On: 12/17/2022 11:54   US Renal  Result Date: 12/16/2022 CLINICAL DATA:  Acute on chronic renal failure. EXAM: RENAL / URINARY TRACT ULTRASOUND COMPLETE COMPARISON:  April 17, 2022 FINDINGS: Right Kidney: Renal measurements: 9.7 cm x 5.8 cm x 5.0 cm = volume: 146 mL. There is diffusely increased echogenicity of the renal parenchyma. A 2.6 cm x 2.5 cm x 3.0 cm and 2.5 cm x 1.9 cm x 2.1 cm cysts are seen within the right kidney. There is mild right-sided hydronephrosis. Left Kidney: Renal measurements: 10.6 cm x 6.2 cm x 6.0 cm = volume: 207 mL. There is diffusely increased echogenicity of the renal parenchyma. 1.7 cm x 1.6 cm x 2.0 cm and 4.3 cm x 3.7 cm x 5.4 cm simple cysts are seen within the left kidney. There is moderate severity left-sided hydronephrosis. Bladder: A Foley catheter is present. Other: None. IMPRESSION: 1. Bilateral echogenic kidneys, likely secondary to medical renal disease. 2. Multiple large, bilateral simple renal cysts. 3. Bilateral hydronephrosis, left greater than right. Electronically Signed   By: Aram Candela M.D.   On: 12/16/2022 21:01        Scheduled Meds:  aspirin EC  81 mg Oral Daily   atorvastatin  10 mg Oral Daily   feeding supplement  237 mL Oral BID BM   Continuous Infusions:  sodium bicarbonate 150 mEq in dextrose 5 % 1,150 mL infusion 75 mL/hr at 12/16/22 1659   sodium chloride       LOS: 1 day     Tresa Moore, MD [facility]   If 7PM-7AM, please contact night-coverage  12/17/2022, 1:50 PM

## 2022-12-17 NOTE — Progress Notes (Signed)
[facility]  ROUNDING NOTE   Subjective:   Pateint was last seen in nephrology office on 9/12 for work up for worsening renal function and nephrotic range proteinuria and hematuria. He was found to have M spike on SPEP and was asked to follow up with oncology.   [facility] by [name]. Patient 's labs shown a creatinine of 9.17, potassium of 5.7, bicarb of 12, and sodium of 121. He was asked to present to the emergency department.   Patient started on IV fluids and improvement in blood work with sodium bicarbonate infusion.   Objective:  Vital signs in last 24 hours:  Temp:  [97.6 F (36.4 C)-99.2 F (37.3 C)] 97.6 F (36.4 C) (10/30 1054) Pulse Rate:  [51-108] 93 (10/30 1054) Resp:  [9-19] 16 (10/30 1054) BP: (93-138)/(41-87) 95/57 (10/30 1054) SpO2:  [95 %-100 %] 96 % (10/30 1054) Weight:  [63.8 kg] 63.8 kg (10/30 0826)  Weight change:  Filed Weights   12/16/22 1900 12/17/22 0826  Weight: 63.8 kg 63.8 kg    Intake/Output: I/O last 3 completed shifts: In: 1843.9 [I.V.:761.7; IV Piggyback:1082.3] Out: 1400 [Urine:1400]   Intake/Output this shift:  Total I/O In: 60 [P.O.:60] Out: -   Physical Exam: General: NAD,   Head: Normocephalic, atraumatic. Moist oral mucosal membranes  Eyes: Anicteric, PERRL  Neck: Supple, trachea midline  Lungs:  Clear to auscultation  Heart: Regular rate and rhythm  Abdomen:  Soft, nontender,   Extremities:  no peripheral edema.  Neurologic: Nonfocal, moving all four extremities  Skin: No lesions  Access: none    Basic Metabolic Panel: Recent Labs  Lab 12/16/22 1227 12/16/22 1513 12/17/22 0436  NA 124* 121* 125*  K 6.0* 5.7* 4.4  CL 100 99 101  CO2 11* 12* 13*  GLUCOSE 124* 136* 143*  BUN 144* 133* 132*  CREATININE 9.00* 9.17* 8.43*  CALCIUM 9.1 8.7* 8.0*  MG  --  3.2*  --     Liver Function Tests: Recent Labs  Lab 12/16/22 1227 12/16/22 1513  AST 8* 9*  ALT 13 12   ALKPHOS 67 70  BILITOT 0.5 0.7  PROT 8.9* 8.7*  ALBUMIN 3.5 3.2*   No results for input(s): "LIPASE", "AMYLASE" in the last 168 hours. No results for input(s): "AMMONIA" in the last 168 hours.  CBC: Recent Labs  Lab 12/16/22 1227 12/16/22 1513 12/17/22 0436  WBC 14.2* 12.7* 23.9*  NEUTROABS 12.0* 11.0* 22.4*  HGB 9.0* 9.3* 7.4*  HCT 27.6* 28.1* 21.3*  MCV 98.6 96.9 94.7  PLT 332 327 258    Cardiac Enzymes: No results for input(s): "CKTOTAL", "CKMB", "CKMBINDEX", "TROPONINI" in the last 168 hours.  BNP: Invalid input(s): "POCBNP"  CBG: No results for input(s): "GLUCAP" in the last 168 hours.  Microbiology: Results for orders placed or performed in visit on 10/01/22  Microscopic Examination     Status: Abnormal   Collection Time: 10/01/22  2:14 PM   Urine  Result Value Ref Range Status   WBC, UA >30 (A) 0 - 5 /hpf Final   RBC, Urine 11-30 (A) 0 - 2 /hpf Final   Epithelial Cells (non renal) 0-10 0 - 10 /hpf Final   Bacteria, UA Moderate (A) None seen/Few Final  CULTURE, URINE COMPREHENSIVE     Status: Abnormal   Collection Time: 10/01/22  3:33 PM   Specimen: Urine   UR  Result Value Ref Range Status   Urine Culture, Comprehensive Final report (  A)  Final   Organism ID, Bacteria Escherichia coli (A)  Final    Comment: Cefazolin <=4 ug/mL Cefazolin with an MIC <=16 predicts susceptibility to the oral agents cefaclor, cefdinir, cefpodoxime, cefprozil, cefuroxime, cephalexin, and loracarbef when used for therapy of uncomplicated urinary tract infections due to E. coli, Klebsiella pneumoniae, and Proteus mirabilis. 50,000-100,000 colony forming units per mL    ANTIMICROBIAL SUSCEPTIBILITY Comment  Final    Comment:       ** S = Susceptible; I = Intermediate; R = Resistant **                    P = Positive; N = Negative             MICS are expressed in micrograms per mL    Antibiotic                 RSLT#1    RSLT#2    RSLT#3    RSLT#4 Amoxicillin/Clavulanic  Acid    S Ampicillin                     S Cefepime                       S Ceftriaxone                    S Cefuroxime                     S Ciprofloxacin                  S Ertapenem                      S Gentamicin                     R Imipenem                       S Levofloxacin                   I Meropenem                      S Nitrofurantoin                 S Piperacillin/Tazobactam        S Tetracycline                   R Tobramycin                     I Trimethoprim/Sulfa             R     Coagulation Studies: No results for input(s): "LABPROT", "INR" in the last 72 hours.  Urinalysis: Recent Labs    12/16/22 1513  COLORURINE RED*  LABSPEC TEST NOT REPORTED DUE TO COLOR INTERFERENCE OF URINE PIGMENT  PHURINE TEST NOT REPORTED DUE TO COLOR INTERFERENCE OF URINE PIGMENT  GLUCOSEU TEST NOT REPORTED DUE TO COLOR INTERFERENCE OF URINE PIGMENT*  HGBUR TEST NOT REPORTED DUE TO COLOR INTERFERENCE OF URINE PIGMENT*  BILIRUBINUR TEST NOT REPORTED DUE TO COLOR INTERFERENCE OF URINE PIGMENT*  KETONESUR TEST NOT REPORTED DUE TO COLOR INTERFERENCE OF URINE PIGMENT*  PROTEINUR TEST NOT REPORTED DUE TO COLOR INTERFERENCE OF URINE PIGMENT*  NITRITE TEST NOT REPORTED DUE TO COLOR  INTERFERENCE OF URINE PIGMENT*  LEUKOCYTESUR TEST NOT REPORTED DUE TO COLOR INTERFERENCE OF URINE PIGMENT*      Imaging: IR BONE MARROW BIOPSY & ASPIRATION  Result Date: 12/17/2022 INDICATION: Multiple myeloma workup EXAM: Bone marrow aspiration and core biopsy using fluoroscopic guidance MEDICATIONS: None. ANESTHESIA/SEDATION: Moderate (conscious) sedation was employed during this procedure. A total of Versed 0.5 mg and Fentanyl 25 mcg was administered intravenously. Moderate Sedation Time: 10 minutes. The patient's level of consciousness and vital signs were monitored continuously by radiology nursing throughout the procedure under my direct supervision. FLUOROSCOPY TIME:  Fluoroscopy Time: 0.8  minutes (3 mGy) COMPLICATIONS: None immediate. PROCEDURE: Informed written consent was obtained from the patient after a thorough discussion of the procedural risks, benefits and alternatives. All questions were addressed. Maximal Sterile Barrier Technique was utilized including caps, mask, sterile gowns, sterile gloves, sterile drape, hand hygiene and skin antiseptic. A timeout was performed prior to the initiation of the procedure. The patient was placed prone on the exam table. Limited fluoroscopy of the pelvis was performed for planning purposes. Skin entry site was marked, and the overlying skin was prepped and draped in the standard sterile fashion. Local analgesia was obtained with 1% lidocaine. Using fluoroscopic guidance, an 11 gauge needle was advanced just deep to the cortex of the right posterior ilium. Subsequently, bone marrow aspiration and core biopsy were performed. Specimens were submitted to lab/pathology for handling. Hemostasis was achieved with manual pressure, and a clean dressing was placed. The patient tolerated the procedure well without immediate complication. IMPRESSION: Successful bone marrow aspiration and core biopsy of the right posterior ilium using fluoroscopic guidance. Electronically Signed   By: Olive Bass M.D.   On: 12/17/2022 11:54   US Renal  Result Date: 12/16/2022 CLINICAL DATA:  Acute on chronic renal failure. EXAM: RENAL / URINARY TRACT ULTRASOUND COMPLETE COMPARISON:  April 17, 2022 FINDINGS: Right Kidney: Renal measurements: 9.7 cm x 5.8 cm x 5.0 cm = volume: 146 mL. There is diffusely increased echogenicity of the renal parenchyma. A 2.6 cm x 2.5 cm x 3.0 cm and 2.5 cm x 1.9 cm x 2.1 cm cysts are seen within the right kidney. There is mild right-sided hydronephrosis. Left Kidney: Renal measurements: 10.6 cm x 6.2 cm x 6.0 cm = volume: 207 mL. There is diffusely increased echogenicity of the renal parenchyma. 1.7 cm x 1.6 cm x 2.0 cm and 4.3 cm x 3.7 cm x 5.4  cm simple cysts are seen within the left kidney. There is moderate severity left-sided hydronephrosis. Bladder: A Foley catheter is present. Other: None. IMPRESSION: 1. Bilateral echogenic kidneys, likely secondary to medical renal disease. 2. Multiple large, bilateral simple renal cysts. 3. Bilateral hydronephrosis, left greater than right. Electronically Signed   By: Aram Candela M.D.   On: 12/16/2022 21:01     Medications:    sodium bicarbonate 150 mEq in dextrose 5 % 1,150 mL infusion 75 mL/hr at 12/16/22 1659   sodium chloride      aspirin EC  81 mg Oral Daily   atorvastatin  10 mg Oral Daily   feeding supplement  237 mL Oral BID BM   morphine injection  Assessment/ Plan:  Mr. BRANDTLY DOIRON is a 77 y.o.  male with hypertension, AAA, hyperlipidemia, coronary artery disease status post CABG, bladder cancer, glaucoma who [facility] to [facility] the [facility] with Hyperkalemia [E87.5] Hyponatremia [E87.1] AKI (acute kidney injury) (HCC) [N17.9] Anemia, unspecified type [D64.9] Acute renal failure superimposed on chronic  kidney disease, unspecified acute renal failure type, unspecified CKD stage (HCC) [N17.9, N18.9]  Acute Kidney Injury with hyperkalemia on Chronic kidney disease stage IV with proteinuria: concern for underlying multiple myeloma and cast nephropathy - avoid saline infusion due to possible precipitation of casts.  - Continue sodium bicarbonate.   - No indication for dialysis.   Hyponatremia: hyperosmolar consistent with pseudohyponatremia from plasma cell proteins.   Acute metabolic acidosis: nonanion gap: consistent with renal tubular acidosis type IV.  - sodium bicarbonate.   Anemia with chronic kidney disease: with leukocytosis. Concerning for multiple myeloma or other blood cell dyscrasia. Appreciate hematology input.    LOS: 1 Octavion Mollenkopf 10/30/202412:12 PM

## 2022-12-17 NOTE — Consult Note (Signed)
Chief Complaint: Patient was seen in consultation today for  Chief Complaint  Patient presents with   Abnormal Lab   Referring Physician(s): [name]  Supervising Physician: Pernell Dupre  Patient Status: [facility] - In-pt  History of Present Illness: [name] is a 77 y.o. male with a medical history significant for CAD, CABG x 4, HTN, chronic kidney disease, prostate cancer and bladder cancer. He has been undergoing work up with nephrology for worsening kidney function and is also being followed by Oncology with recent labs concerning for multiple myeloma.   He had a visit with [name] yesterday and had labs drawn. A CMP came back with a creatinine of 9 and a potassium of 6. His baseline creatinine level is 2.5. She advised the patient to go to the ER for further work up of possible multiple myeloma. He has been admitted and Interventional Radiology has been asked to evaluate this patient for an image-guided bone marrow biopsy with aspiration.  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 4v CABG on 04/13/2002. b.) LHC 02/03/2013 --> LVEF 50%; severe 3v CAD with patent LIMA-LAD and SVG-OM1 grafts; SVG-PDA and SVG-DG1 occluded with collaterals to PDA and DG1 from LAD.   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   a.) LIMA-LAD, SVG-OM1, SVG-D1, SVG-PDA   Tubular adenoma of colon 02/03/2007    Past Surgical History:  Procedure Laterality Date   CARDIAC CATHETERIZATION Left 04/13/2002   Procedure: CARDIAC CATHETERIZATION; Location: [facility]; Surgeon; Marcina Millard, MD   CARDIAC CATHETERIZATION Left 02/03/2013   Procedure: CARDIAC CATHETERIZATION; Location: [facility]; Surgeon: Marcina Millard, MD   COLONOSCOPY W/ POLYPECTOMY     COLONOSCOPY WITH PROPOFOL N/A 04/09/2015   Procedure: COLONOSCOPY WITH PROPOFOL;  Surgeon: Wallace Cullens, MD;  Location: [facility];  Service: Gastroenterology;  Laterality: N/A;   CORONARY ANGIOPLASTY     CORONARY ARTERY BYPASS GRAFT N/A 04/13/2002   Procedure: 4v CABG (LIMA-LAD, SVG-OM1, SVG-D1, SVG-PDA); Location: [facility]; Surgeon: Rana Snare, MD   CYSTOSCOPY N/A 07/17/2021   Procedure: Bluford Kaufmann;  Surgeon: [name], MD;  Location: [facility] ORS;  Service: Urology;  Laterality: N/A;   CYSTOSCOPY N/A 06/24/2022   Procedure: DIAGNOSTIC CYSTOSCOPY;  Surgeon: [name], MD;  Location: [facility] OR[facility];  Service: Urology;  Laterality: N/A;   CYSTOSCOPY WITH BIOPSY N/A 07/17/2021   Procedure: CYSTOSCOPY WITH BIOPSY;  Surgeon: [name], MD;  Location: [facility];  Service: Urology;  Laterality: N/A;   CYSTOSCOPY WITH BIOPSY N/A 06/24/2022   Procedure: CYSTOSCOPY WITH BLADDER BIOPSY;  Surgeon: [name], MD;  Location: [facility];  Service: Urology;  Laterality: N/A;   CYSTOSCOPY  WITH FULGERATION N/A 07/17/2021   Procedure: CYSTOSCOPY WITH FULGERATION;  Surgeon: [name], MD;  Location: [facility];  Service: Urology;  Laterality: N/A;   CYSTOSCOPY WITH FULGERATION N/A 06/24/2022   Procedure: CYSTOSCOPY WITH FULGERATION;  Surgeon: [name], MD;  Location: [facility];  Service: Urology;  Laterality: N/A;   EYE SURGERY     both eyes with cataract with lens 2021   TONSILLECTOMY     TRANSURETHRAL RESECTION OF BLADDER TUMOR N/A 11/13/2020   Procedure:  TRANSURETHRAL RESECTION OF BLADDER TUMOR (TURBT);  Surgeon: [name], MD;  Location: [facility] OR[facility];  Service: Urology;  Laterality: N/A;   TRANSURETHRAL RESECTION OF BLADDER TUMOR N/A 06/24/2022   Procedure: TRANSURETHRAL RESECTION OF BLADDER TUMOR (TURBT);  Surgeon: [name], MD;  Location: [facility];  Service: Urology;  Laterality: N/A;   TRANSURETHRAL RESECTION OF BLADDER TUMOR WITH GYRUS (TURBT-GYRUS)  06/24/2022   TRANSURETHRAL RESECTION OF PROSTATE      Allergies: Cidex [glutaral] and Other  Medications: Prior to Admission medications   Medication Sig Start Date End Date Taking? Authorizing Provider  acetaminophen (TYLENOL) 500 MG tablet Take 500 mg by mouth every 6 (six) hours as needed.    [provider]  aspirin 81 MG tablet Take 81 mg by mouth daily.    [provider]  atorvastatin (LIPITOR) 10 MG tablet Take 10 mg by mouth daily. 04/29/22   [provider]  cetirizine (ZYRTEC) 10 MG tablet Take 10 mg by mouth at bedtime.    [provider]  Coenzyme Q10 100 MG capsule Take 100 mg by mouth daily.     [provider]  ferrous sulfate 324 MG TBEC Take 324 mg by mouth.    [provider]  fluticasone (FLONASE) 50 MCG/ACT nasal spray Place 1 spray into both nostrils as needed.    [provider]  GLUCOSAMINE-CHONDROITIN PO Take 1 tablet by mouth 2 (two) times daily.    [provider]  HYDROcodone-acetaminophen (NORCO/VICODIN) 5-325 MG tablet Take 1 tablet by mouth every 6 (six) hours as needed for moderate pain. 06/24/22   Stoioff, Verna Czech, MD  isosorbide mononitrate (IMDUR) 30 MG 24 hr tablet Take 30 mg by mouth daily.    [provider]  Magnesium 200 MG TABS Take 200 mg by mouth 2 (two) times daily.    [provider]  metoprolol tartrate (LOPRESSOR) 25 MG tablet Take 25 mg by mouth 2 (two) times daily.    [provider]  metroNIDAZOLE (METROGEL) 0.75 % gel Apply 1 application  topically 2 (two) times daily.    [provider]  Multiple Vitamin (MULTIVITAMIN) tablet Take 1 tablet by mouth daily.    [provider]  nitroGLYCERIN (NITROSTAT) 0.4 MG SL tablet Place under the tongue. 08/21/21   [provider]  Omega-3 Fatty Acids (FISH OIL) 1000 MG CAPS Take 1,000 mg by mouth daily.    [provider]  Potassium Gluconate 595 MG CAPS Take 1 capsule by mouth daily as needed (After HT TR exercise).    [provider]  Probiotic Product (MEGA PROBIOTIC) CAPS Take 1 capsule by mouth daily. 07/20/19   [provider]  silodosin (RAPAFLO) 8 MG CAPS capsule Take 1 capsule (8 mg total) by mouth daily with breakfast. 03/26/22   Carman Ching, PA-C  Vitamin A 2400 MCG (8000 UT) CAPS Take 8,000 Units by mouth daily.    [provider]  vitamin B-12 (CYANOCOBALAMIN) 1000 MCG tablet  Take 1,000 mcg by mouth daily.    [provider]  vitamin C (ASCORBIC ACID) 250 MG tablet Take 250 mg by mouth 2 (two) times daily.    [provider]  zinc gluconate 50 MG tablet Take 50 mg by mouth daily.    [provider]     Family History  Problem Relation Age of Onset   Prostate cancer Father        Metastatic   Cancer Father     Social History   Socioeconomic History   Marital status: Married    Spouse name: [name]   Number of children: Not on file   Years of education: Not on file   Highest education level: Not on file  Occupational History   Not on file  Tobacco Use   Smoking status: Former    Current packs/day: 0.00    Average packs/day: 0.8 packs/day for 60.0 years (45.0 ttl pk-yrs)    Types: Cigarettes    Start date: 65    Quit date: 2008    Years since quitting: 16.8    Passive exposure: Past   Smokeless tobacco: Never  Substance and Sexual Activity   Alcohol use: Yes    Alcohol/week: 2.0 standard drinks of alcohol    Types: 2 Standard drinks or equivalent per week     Comment: 14/week   Drug use: No   Sexual activity: Not on file  Other Topics Concern   Not on file  Social History Narrative   Not on file   Social Determinants of Health   Financial Resource Strain: Not on file  Food Insecurity: No Food Insecurity (12/16/2022)   Hunger Vital Sign    Worried About Running Out of Food in the Last Year: Never true    Ran Out of Food in the Last Year: Never true  Transportation Needs: No Transportation Needs (12/16/2022)   PRAPARE - Administrator, Civil Service (Medical): No    Lack of Transportation (Non-Medical): No  Physical Activity: Not on file  Stress: Not on file  Social Connections: Not on file    Review of Systems: A 12 point ROS discussed and pertinent positives are indicated in the HPI above.  All other systems are negative.  Review of Systems  Constitutional:  Positive for fatigue.  Respiratory:  Negative for cough and shortness of breath.   Cardiovascular:  Negative for chest pain and leg swelling.  Gastrointestinal:  Negative for abdominal pain, diarrhea, nausea and vomiting.  Genitourinary:  Negative for flank pain.  Musculoskeletal:  Positive for arthralgias and myalgias. Negative for back pain.  Neurological:  Negative for dizziness and headaches.    Vital Signs: BP (!) 93/44 (BP Location: Right Arm)   Pulse 92   Temp 98.2 F (36.8 C) (Oral)   Resp 18   Ht 5\' 6"  (1.676 m)   Wt 140 lb 9.6 oz (63.8 kg)   SpO2 98%   BMI 22.69 kg/m   Physical Exam Constitutional:      Appearance: He is not ill-appearing.     Comments: Patient is in mild physical distress from bilateral lower extremity pain.   HENT:     Mouth/Throat:     Mouth: Mucous membranes are moist.     Pharynx: Oropharynx is clear.  Cardiovascular:     Rate and Rhythm: Normal rate.     Pulses: Normal pulses.  Pulmonary:     Effort: Pulmonary effort is normal.  Abdominal:  Palpations: Abdomen is soft.     Tenderness: There is no abdominal  tenderness.  Genitourinary:    Comments: Foley catheter with amber colored urine in gravity bag.  Musculoskeletal:     Right lower leg: No edema.     Left lower leg: No edema.  Skin:    General: Skin is warm and dry.  Neurological:     Mental Status: He is alert and oriented to person, place, and time.     Imaging: US Renal  Result Date: 12/16/2022 CLINICAL DATA:  Acute on chronic renal failure. EXAM: RENAL / URINARY TRACT ULTRASOUND COMPLETE COMPARISON:  April 17, 2022 FINDINGS: Right Kidney: Renal measurements: 9.7 cm x 5.8 cm x 5.0 cm = volume: 146 mL. There is diffusely increased echogenicity of the renal parenchyma. A 2.6 cm x 2.5 cm x 3.0 cm and 2.5 cm x 1.9 cm x 2.1 cm cysts are seen within the right kidney. There is mild right-sided hydronephrosis. Left Kidney: Renal measurements: 10.6 cm x 6.2 cm x 6.0 cm = volume: 207 mL. There is diffusely increased echogenicity of the renal parenchyma. 1.7 cm x 1.6 cm x 2.0 cm and 4.3 cm x 3.7 cm x 5.4 cm simple cysts are seen within the left kidney. There is moderate severity left-sided hydronephrosis. Bladder: A Foley catheter is present. Other: None. IMPRESSION: 1. Bilateral echogenic kidneys, likely secondary to medical renal disease. 2. Multiple large, bilateral simple renal cysts. 3. Bilateral hydronephrosis, left greater than right. Electronically Signed   By: Aram Candela M.D.   On: 12/16/2022 21:01    Labs:  CBC: Recent Labs    07/25/22 0732 12/16/22 1227 12/16/22 1513 12/17/22 0436  WBC 15.2* 14.2* 12.7* 23.9*  HGB 10.9* 9.0* 9.3* 7.4*  HCT 34.1* 27.6* 28.1* 21.3*  PLT 251 332 327 258    COAGS: No results for input(s): "INR", "APTT" in the last 8760 hours.  BMP: Recent Labs    07/25/22 0637 12/16/22 1227 12/16/22 1513 12/17/22 0436  NA 139 124* 121* 125*  K 4.5 6.0* 5.7* 4.4  CL 113* 100 99 101  CO2 20* 11* 12* 13*  GLUCOSE 125* 124* 136* 143*  BUN 26* 144* 133* 132*  CALCIUM 9.2 9.1 8.7* 8.0*   CREATININE 2.06* 9.00* 9.17* 8.43*  GFRNONAA 32* 6* 5* 6*    LIVER FUNCTION TESTS: Recent Labs    07/25/22 0637 12/16/22 1227 12/16/22 1513  BILITOT 0.7 0.5 0.7  AST 23 8* 9*  ALT 14 13 12   ALKPHOS 63 67 70  PROT 8.2* 8.9* 8.7*  ALBUMIN 3.5 3.5 3.2*    TUMOR MARKERS: No results for input(s): "AFPTM", "CEA", "CA199", "CHROMGRNA" in the last 8760 hours.  Assessment and Plan:  Abnormal labs; concern for multiple myeloma: [name], 77 year old male, is scheduled today for an image-guided bone marrow biopsy with aspiration.   Risks and benefits of this procedure were discussed with the patient and/or patient's family including, but not limited to bleeding, infection, damage to adjacent structures or low yield requiring additional tests.  All of the questions were answered and there is agreement to proceed. He has been NPO.   Consent signed and in chart.  Thank you for this interesting consult.  I greatly enjoyed meeting [name] and look forward to participating in their care.  A copy of this report was sent to the requesting provider on this date.  Electronically Signed: Alwyn Ren, AGACNP-BC [phone] 12/17/2022, 7:47 AM   I spent a total  of 20 Minutes    in face to face in clinical consultation, greater than 50% of which was counseling/coordinating care for bone marrow biopsy with aspiration.

## 2022-12-17 NOTE — Progress Notes (Signed)
OT Cancellation Note  Patient Details Name: [name] MRN: [id number] DOB: 02/07/1946   Cancelled Treatment:    Reason Eval/Treat Not Completed: Patient at procedure or test/ unavailable. Order received, chart reviewed. Pt currently off the unit for imaging. Spouse at bedside reports pt has been NPO all day and fatigued from AM biopsy/scans. Requests re-attempt to initiate services next date as able.   Kathie Dike, M.S. OTR/L  12/17/22, 1:17 PM  ascom [phone]

## 2022-12-17 NOTE — Plan of Care (Signed)
  Problem: Health Behavior/Discharge Planning: Goal: Ability to manage health-related needs will improve Outcome: Progressing   

## 2022-12-17 NOTE — Plan of Care (Signed)

## 2022-12-17 NOTE — Progress Notes (Addendum)
   12/17/22 1317  PT Visit Information  Last PT Received On 12/17/22  Reason Eval/Treat Not Completed Patient at procedure or test/unavailable   Pt out of room for PET scan, discussed case with treating OT who reports wife indicates pt will be tired and hungry after busy day. Unavailable for PT at this time, will keep pt on caseload and attempt to see pt when appropriate. Loran Senters, DPT

## 2022-12-17 NOTE — Progress Notes (Signed)
Patient clinically stable post IR BMB per Dr Juliette Alcide, tolerated well, vitals stable pre and post procedure. Received Versed 0.5 mg along with Fentanyl 25 mcg IV  for procedure. Report given to Valier Rn post procedure/14/specials.

## 2022-12-17 NOTE — Progress Notes (Signed)
CROSS COVER NOTE  NAME: TABORIS TEDROW MRN: [id number] DOB : 1945-06-11    Concern as stated by nurse / staff   critical serum osmo 324      Pertinent findings on chart review: Hand P briefly reviewed  Assessment and  Interventions   Assessment:  Plan: Ns bolus ordered X X

## 2022-12-17 NOTE — Progress Notes (Signed)
Initial Nutrition Assessment  DOCUMENTATION CODES:   Not applicable  INTERVENTION:   -Liberalize diet to regular for widest variety of meal selections -Continue Ensure Enlive po BID, each supplement provides 350 kcal and 20 grams of protein -MVI with minerals daily  NUTRITION DIAGNOSIS:   Increased nutrient needs related to chronic illness (bladder cancer and prostate cancer s/p radiation) as evidenced by estimated needs.  GOAL:   Patient will meet greater than or equal to 90% of their needs  MONITOR:   PO intake, Supplement acceptance  REASON FOR ASSESSMENT:   Malnutrition Screening Tool    ASSESSMENT:   Pt with medical history significant of hypertension, hyperlipidemia, coronary artery disease status post CABG, history of bladder cancer and prostate cancer s/p radiation who follows up with urology closely.  Patient presents after he was called to come to the emergency room by hematology [name] on account of having worsening renal function and hyperkalemia.  Pt admitted with acute renal failure likely in the setting of multiple myeloma.   Reviewed I/O's: +444 ml x 24 hours  UOP: 1.4 L x 24 hours   Plan for bone marrow biopsy today. Pt out of room for procedure. RD unable to obtain further nutrition-related history or complete nutrition-focused physical exam at this time.      Noted pt was placed on a heart healthy diet; RD liberalized diet to regular for widest variety of meal selections.   Reviewed wt hx; pt has experienced a 19.1% wt loss over the past 6 months, which is significant for time frame. Pt is at high risk for malnutrition, however, unable to identify at this time. Pt would greatly benefit from addition of oral nutrition supplements.   Medications reviewed and include sodium bicarbonate 150 mEq in dextrose 5% 1150 ml infusion @ 75 ml/hr.   Labs reviewed: Na: 125, CBGS: 97 (inpatient orders for glycemic control are none).    Diet Order:   Diet Order              Diet regular Room service appropriate? Yes; Fluid consistency: Thin  Diet effective now                   EDUCATION NEEDS:   No education needs have been identified at this time  Skin:  Skin Assessment: Reviewed RN Assessment  Last BM:  Unknown  Height:   Ht Readings from Last 1 Encounters:  12/17/22 5\' 6"  (1.676 m)    Weight:   Wt Readings from Last 1 Encounters:  12/17/22 63.8 kg    Ideal Body Weight:  64.5 kg  BMI:  Body mass index is 22.7 kg/m.  Estimated Nutritional Needs:   Kcal:  1900-2100  Protein:  95-110 grams  Fluid:  > 1.9 L    Levada Schilling, RD, LDN, CDCES Registered Dietitian III Certified Diabetes Care and Education Specialist Please refer to [facility] for RD and/or RD on-call/weekend/after hours pager

## 2022-12-18 ENCOUNTER — Encounter
Admission: EM | Disposition: A | Discharge status: Home Health | Payer: Self-pay | Source: Home / Self Care | Attending: Internal Medicine

## 2022-12-18 ENCOUNTER — Encounter: Payer: Medicare Other | Admitting: Physician Assistant

## 2022-12-18 DIAGNOSIS — N179 Acute kidney failure, unspecified: Secondary | ICD-10-CM | POA: Diagnosis not present

## 2022-12-18 LAB — CBC WITH DIFFERENTIAL/PLATELET
Abs Immature Granulocytes: 0.06 10*3/uL (ref 0.00–0.07)
Basophils Absolute: 0 10*3/uL (ref 0.0–0.1)
Basophils Relative: 0 %
Eosinophils Absolute: 0.1 10*3/uL (ref 0.0–0.5)
Eosinophils Relative: 1 %
HCT: 21.2 % — ABNORMAL LOW (ref 39.0–52.0)
Hemoglobin: 7.4 g/dL — ABNORMAL LOW (ref 13.0–17.0)
Immature Granulocytes: 1 %
Lymphocytes Relative: 5 %
Lymphs Abs: 0.6 10*3/uL — ABNORMAL LOW (ref 0.7–4.0)
MCH: 32.2 pg (ref 26.0–34.0)
MCHC: 34.9 g/dL (ref 30.0–36.0)
MCV: 92.2 fL (ref 80.0–100.0)
Monocytes Absolute: 0.9 10*3/uL (ref 0.1–1.0)
Monocytes Relative: 8 %
Neutro Abs: 9.1 10*3/uL — ABNORMAL HIGH (ref 1.7–7.7)
Neutrophils Relative %: 85 %
Platelets: 253 10*3/uL (ref 150–400)
RBC: 2.3 MIL/uL — ABNORMAL LOW (ref 4.22–5.81)
RDW: 13.9 % (ref 11.5–15.5)
WBC: 10.7 10*3/uL — ABNORMAL HIGH (ref 4.0–10.5)
nRBC: 0 % (ref 0.0–0.2)

## 2022-12-18 LAB — BASIC METABOLIC PANEL
Anion gap: 11 (ref 5–15)
BUN: 108 mg/dL — ABNORMAL HIGH (ref 8–23)
CO2: 18 mmol/L — ABNORMAL LOW (ref 22–32)
Calcium: 8 mg/dL — ABNORMAL LOW (ref 8.9–10.3)
Chloride: 100 mmol/L (ref 98–111)
Creatinine, Ser: 6.68 mg/dL — ABNORMAL HIGH (ref 0.61–1.24)
GFR, Estimated: 8 mL/min — ABNORMAL LOW (ref >60)
Glucose, Bld: 119 mg/dL — ABNORMAL HIGH (ref 70–99)
Potassium: 4 mmol/L (ref 3.5–5.1)
Sodium: 129 mmol/L — ABNORMAL LOW (ref 135–145)

## 2022-12-18 LAB — HEPATITIS B CORE ANTIBODY, TOTAL: Hep B Core Total Ab: NONREACTIVE

## 2022-12-18 LAB — HEPATITIS B SURFACE ANTIGEN: Hepatitis B Surface Ag: NONREACTIVE

## 2022-12-18 SURGERY — TEMPORARY DIALYSIS CATHETER
Anesthesia: Moderate Sedation

## 2022-12-18 MED ORDER — FLUTICASONE PROPIONATE 50 MCG/ACT NA SUSP: 1.0000 | NASAL | Status: DC | PRN

## 2022-12-18 MED ORDER — MELATONIN 5 MG PO TABS
2.5000 mg | ORAL_TABLET | Freq: Every evening | ORAL | Status: DC | PRN
  Administered 2022-12-18 – 2022-12-21 (×4): 2.5 mg via ORAL
  Filled 2022-12-18 (×4): qty 1

## 2022-12-18 MED ORDER — LORATADINE 10 MG PO TABS
10.0000 mg | ORAL_TABLET | Freq: Every day | ORAL | Status: DC
  Administered 2022-12-19 – 2022-12-25 (×7): 10 mg via ORAL
  Filled 2022-12-18 (×8): qty 1

## 2022-12-18 MED ORDER — FERROUS SULFATE 325 (65 FE) MG PO TABS
324.0000 mg | ORAL_TABLET | Freq: Every day | ORAL | Status: DC
  Administered 2022-12-19 – 2022-12-20 (×2): 324 mg via ORAL
  Filled 2022-12-18 (×2): qty 1

## 2022-12-18 MED ORDER — ISOSORBIDE MONONITRATE ER 30 MG PO TB24: 30.0000 mg | ORAL_TABLET | Freq: Every day | ORAL | Status: DC

## 2022-12-18 NOTE — Progress Notes (Signed)
PROGRESS NOTE    [name]  XBM:[id number] DOB: 1945/12/05 DOA: 12/16/2022 PCP: [name], MD    Brief Narrative:   77 y.o. male with medical history significant of hypertension, hyperlipidemia, coronary artery disease status post CABG, history of bladder cancer and prostate cancer s/p radiation who follows up with urology closely.  Patient presents to the emergency room today after he was called to come to the emergency room by hematology [name] on account of having worsening renal function and hyperkalemia.  According to patient in August he followed up with his primary care physician and he was found to have worsening anemia and therefore referred to hematology for further management.  Patient saw hematologist today [name] and underwent some blood work and was later called to come to the emergency room on account of the abnormal labs.    Assessment & Plan:   Principal Problem:   AKI (acute kidney injury) (HCC)  Acute renal failure likely in the setting of possible multiple myeloma Patient presented with creatinine of 9 Baseline creatinine of 1.5-2 in the last 4 to 5 months  Elevated free kappa and lambda obtained in September 2024 Given elevated protein gap as well as rapidly worsening renal function in the setting of anemia, this raises high suspicion for multiple myeloma Nephrology on board Status post bone marrow biopsy 10/30 Status post inpatient PET scan 10/30 Plan: Kidney function is improving.  Creatinine downtrending.  Patient had some clots that were evacuated by bedside RN on 10/30.  Urine now clearing up, now pale yellow.  Case discussed at length with nephrology, oncology, urology.  Consultations appreciated.  At this time will defer dialysis initiation.  Defer nephrostomy tubes.  Continue to trend renal function.  Could consider bilateral nephrostomy tubes if creatinine plateaus despite maximal drainage with Foley however would like to avoid if  possible. Follow-up results of PET scan and bone marrow biopsy per oncology  Normocytic anemia likely 2 secondary to anemia of chronic disease No indication for blood transfusion at this time Monitor CBC closely Transfuse as needed hemoglobin less than 7   Hyperkalemia in the setting of worsening renal failure Patient received hyperkalemia cocktail in the emergency room Potassium has improved Trend daily renal function   Mild leukocytosis Downtrending No clear evidence of infection Continue to monitor closely Antibiotic not indicated at this time Low threshold to start   Severe metabolic acidosis secondary to acute renal failure Continue IV bicarbonate Monitor BMP closely   Hyponatremia Suspect pseudohyponatremia in the setting of plasma cell dyscrasia As evidenced by hyperosmolarity Plan: Continue IV bicarbonate Avoid saline infusion  Hyperlipidemia PTA statin   Hypertension Hold BP meds at this time (home medications include Imdur metoprolol)   History of bladder cancer History of prostate cancer Outpatient follow-up with urology   Coronary artery disease status post CABG Continue statin therapy and aspirin    DVT prophylaxis: SCDs Code Status: Full Family Communication: Spouse at bedside 10/30, 10/31 Disposition Plan: Status is: Inpatient Remains inpatient appropriate because: Severe acute renal failure in the setting of suspected plasma cell dyscrasia   Level of care: Telemetry Medical  Consultants:  Nephrology Hematology oncology  Procedures:  Pulmonary biopsy 10/30 CT PET scan 10/30  Antimicrobials: None   Subjective: Seen and examined.  Much more awake this morning alert.  Oriented x 4.  Wife at bedside.  Objective: Vitals:   12/17/22 1516 12/17/22 2019 12/18/22 0336 12/18/22 0754  BP: (!) 102/50 (!) 114/56 (!) 104/52 98/60  Pulse: 100 (!) 109 95 94  Resp: 20 18 20 16   Temp: 98.7 F (37.1 C) 99.8 F (37.7 C) 98.5 F (36.9 C) 98.2  F (36.8 C)  TempSrc:  Oral Oral   SpO2: 96% 97% 97% 98%  Weight:      Height:        Intake/Output Summary (Last 24 hours) at 12/18/2022 1217 Last data filed at 12/18/2022 0534 Gross per 24 hour  Intake --  Output 1550 ml  Net -1550 ml   Filed Weights   12/16/22 1900 12/17/22 0826  Weight: 63.8 kg 63.8 kg    Examination:  General exam: NAD Respiratory system: Lungs clear normal work of breathing.  Room air Cardiovascular system: S1-S2, RRR, no murmurs, no pedal edema Gastrointestinal system: Soft, NT/ND, normal bowel sounds Central nervous system: Awake.  Oriented x 3.  No focal deficits Extremities: Symmetric 5 x 5 power. Skin: No rashes, lesions or ulcers Psychiatry: Judgement and insight appear normal. Mood & affect appropriate.     Data Reviewed: I have personally reviewed following labs and imaging studies  CBC: Recent Labs  Lab 12/16/22 1227 12/16/22 1513 12/17/22 0436 12/18/22 0819  WBC 14.2* 12.7* 23.9* 10.7*  NEUTROABS 12.0* 11.0* 22.4* 9.1*  HGB 9.0* 9.3* 7.4* 7.4*  HCT 27.6* 28.1* 21.3* 21.2*  MCV 98.6 96.9 94.7 92.2  PLT 332 327 258 253   Basic Metabolic Panel: Recent Labs  Lab 12/16/22 1227 12/16/22 1513 12/17/22 0436 12/18/22 0819  NA 124* 121* 125* 129*  K 6.0* 5.7* 4.4 4.0  CL 100 99 101 100  CO2 11* 12* 13* 18*  GLUCOSE 124* 136* 143* 119*  BUN 144* 133* 132* 108*  CREATININE 9.00* 9.17* 8.43* 6.68*  CALCIUM 9.1 8.7* 8.0* 8.0*  MG  --  3.2*  --   --    GFR: Estimated Creatinine Clearance: 8.2 mL/min (A) (by C-G formula based on SCr of 6.68 mg/dL (H)). Liver Function Tests: Recent Labs  Lab 12/16/22 1227 12/16/22 1513  AST 8* 9*  ALT 13 12  ALKPHOS 67 70  BILITOT 0.5 0.7  PROT 8.9* 8.7*  ALBUMIN 3.5 3.2*   No results for input(s): "LIPASE", "AMYLASE" in the last 168 hours. No results for input(s): "AMMONIA" in the last 168 hours. Coagulation Profile: No results for input(s): "INR", "PROTIME" in the last 168  hours. Cardiac Enzymes: No results for input(s): "CKTOTAL", "CKMB", "CKMBINDEX", "TROPONINI" in the last 168 hours. BNP (last 3 results) No results for input(s): "PROBNP" in the last 8760 hours. HbA1C: No results for input(s): "HGBA1C" in the last 72 hours. CBG: Recent Labs  Lab 12/17/22 1147  GLUCAP 97   Lipid Profile: No results for input(s): "CHOL", "HDL", "LDLCALC", "TRIG", "CHOLHDL", "LDLDIRECT" in the last 72 hours. Thyroid Function Tests: Recent Labs    12/16/22 1227  TSH 5.667*   Anemia Panel: Recent Labs    12/16/22 1227  VITAMINB12 2,084*  FOLATE >40.0  FERRITIN 454*  TIBC 207*  IRON 88  RETICCTPCT 2.8   Sepsis Labs: Recent Labs  Lab 12/17/22 0436  PROCALCITON 3.60    No results found for this or any previous visit (from the past 240 hour(s)).       Radiology Studies: NM PET Image Initial (PI) Whole Body (F-18 FDG)  Result Date: 12/17/2022 CLINICAL DATA:  Initial treatment strategy for multiple myeloma. Prior history of bladder carcinoma and prostate carcinoma. EXAM: NUCLEAR MEDICINE PET WHOLE BODY TECHNIQUE: 7.8 mCi F-18 FDG was injected intravenously. Full-ring PET  imaging was performed from the head to foot after the radiotracer. CT data was obtained and used for attenuation correction and anatomic localization. Fasting blood glucose: 97 mg/dl COMPARISON:  None Available. FINDINGS: Mediastinal blood-pool activity (background): SUV max = 2.7 Liver activity (reference): SUV max = N/A NECK:  No hypermetabolic lymph nodes or masses. Incidental CT findings:  None. CHEST: No hypermetabolic lymph nodes. No suspicious pulmonary nodules seen on CT images. Incidental CT findings:  None. ABDOMEN/PELVIS: No abnormal hypermetabolic activity within the liver, pancreas, adrenal glands, or spleen. No hypermetabolic lymph nodes in the abdomen or pelvis. Incidental CT findings: Mildly enlarged prostate gland. Foley catheter seen within bladder. Diffuse bladder wall  thickening is seen, likely due to chronic bladder outlet obstruction or cystitis. Mild moderate bilateral hydroureteronephrosis is seen to the level of the bladder, likely due to vesicoureteral reflux. SKELETON: No focal lytic or sclerotic bone lesions are seen on CT images. No focal hypermetabolic bone lesions. Incidental CT findings:  None. EXTREMITIES: No focal hypermetabolic bone lesions identified. No focal lytic or sclerotic bone lesion seen on CT images. Incidental CT findings: none IMPRESSION: No focal hypermetabolic or lytic bone lesions identified. Electronically Signed   By: Danae Orleans M.D.   On: 12/17/2022 15:39   IR BONE MARROW BIOPSY & ASPIRATION  Result Date: 12/17/2022 INDICATION: Multiple myeloma workup EXAM: Bone marrow aspiration and core biopsy using fluoroscopic guidance MEDICATIONS: None. ANESTHESIA/SEDATION: Moderate (conscious) sedation was employed during this procedure. A total of Versed 0.5 mg and Fentanyl 25 mcg was administered intravenously. Moderate Sedation Time: 10 minutes. The patient's level of consciousness and vital signs were monitored continuously by radiology nursing throughout the procedure under my direct supervision. FLUOROSCOPY TIME:  Fluoroscopy Time: 0.8 minutes (3 mGy) COMPLICATIONS: None immediate. PROCEDURE: Informed written consent was obtained from the patient after a thorough discussion of the procedural risks, benefits and alternatives. All questions were addressed. Maximal Sterile Barrier Technique was utilized including caps, mask, sterile gowns, sterile gloves, sterile drape, hand hygiene and skin antiseptic. A timeout was performed prior to the initiation of the procedure. The patient was placed prone on the exam table. Limited fluoroscopy of the pelvis was performed for planning purposes. Skin entry site was marked, and the overlying skin was prepped and draped in the standard sterile fashion. Local analgesia was obtained with 1% lidocaine. Using  fluoroscopic guidance, an 11 gauge needle was advanced just deep to the cortex of the right posterior ilium. Subsequently, bone marrow aspiration and core biopsy were performed. Specimens were submitted to lab/pathology for handling. Hemostasis was achieved with manual pressure, and a clean dressing was placed. The patient tolerated the procedure well without immediate complication. IMPRESSION: Successful bone marrow aspiration and core biopsy of the right posterior ilium using fluoroscopic guidance. Electronically Signed   By: Olive Bass M.D.   On: 12/17/2022 11:54   US Renal  Result Date: 12/16/2022 CLINICAL DATA:  Acute on chronic renal failure. EXAM: RENAL / URINARY TRACT ULTRASOUND COMPLETE COMPARISON:  April 17, 2022 FINDINGS: Right Kidney: Renal measurements: 9.7 cm x 5.8 cm x 5.0 cm = volume: 146 mL. There is diffusely increased echogenicity of the renal parenchyma. A 2.6 cm x 2.5 cm x 3.0 cm and 2.5 cm x 1.9 cm x 2.1 cm cysts are seen within the right kidney. There is mild right-sided hydronephrosis. Left Kidney: Renal measurements: 10.6 cm x 6.2 cm x 6.0 cm = volume: 207 mL. There is diffusely increased echogenicity of the renal parenchyma. 1.7 cm x  1.6 cm x 2.0 cm and 4.3 cm x 3.7 cm x 5.4 cm simple cysts are seen within the left kidney. There is moderate severity left-sided hydronephrosis. Bladder: A Foley catheter is present. Other: None. IMPRESSION: 1. Bilateral echogenic kidneys, likely secondary to medical renal disease. 2. Multiple large, bilateral simple renal cysts. 3. Bilateral hydronephrosis, left greater than right. Electronically Signed   By: Aram Candela M.D.   On: 12/16/2022 21:01        Scheduled Meds:  atorvastatin  10 mg Oral Daily   Chlorhexidine Gluconate Cloth  6 each Topical Daily   feeding supplement  237 mL Oral BID BM   multivitamin with minerals  1 tablet Oral Daily   Continuous Infusions:  sodium bicarbonate 150 mEq in dextrose 5 % 1,150 mL infusion  75 mL/hr at 12/18/22 0534     LOS: 2 days     Tresa Moore, MD [facility]   If 7PM-7AM, please contact night-coverage  12/18/2022, 12:17 PM

## 2022-12-18 NOTE — Plan of Care (Signed)
  Problem: Education: Goal: Knowledge of General Education information will improve Description: Including pain rating scale, medication(s)/side effects and non-pharmacologic comfort measures 12/18/2022 0323 by Lamount Cohen, RN Outcome: Progressing 12/18/2022 0323 by Lamount Cohen, RN Outcome: Progressing   Problem: Health Behavior/Discharge Planning: Goal: Ability to manage health-related needs will improve 12/18/2022 0323 by Lamount Cohen, RN Outcome: Progressing 12/18/2022 0323 by Lamount Cohen, RN Outcome: Progressing   Problem: Clinical Measurements: Goal: Ability to maintain clinical measurements within normal limits will improve 12/18/2022 0323 by Lamount Cohen, RN Outcome: Progressing 12/18/2022 0323 by Lamount Cohen, RN Outcome: Progressing Goal: Will remain free from infection 12/18/2022 0323 by Lamount Cohen, RN Outcome: Progressing 12/18/2022 0323 by Lamount Cohen, RN Outcome: Progressing Goal: Diagnostic test results will improve 12/18/2022 0323 by Lamount Cohen, RN Outcome: Progressing 12/18/2022 0323 by Lamount Cohen, RN Outcome: Progressing Goal: Respiratory complications will improve 12/18/2022 0323 by Lamount Cohen, RN Outcome: Progressing 12/18/2022 0323 by Lamount Cohen, RN Outcome: Progressing Goal: Cardiovascular complication will be avoided 12/18/2022 0323 by Lamount Cohen, RN Outcome: Progressing 12/18/2022 0323 by Lamount Cohen, RN Outcome: Progressing   Problem: Activity: Goal: Risk for activity intolerance will decrease 12/18/2022 0323 by Lamount Cohen, RN Outcome: Progressing 12/18/2022 0323 by Lamount Cohen, RN Outcome: Progressing   Problem: Nutrition: Goal: Adequate nutrition will be maintained 12/18/2022 0323 by Lamount Cohen, RN Outcome: Progressing 12/18/2022 0323 by Lamount Cohen, RN Outcome: Progressing   Problem: Coping: Goal: Level of anxiety will  decrease 12/18/2022 0323 by Lamount Cohen, RN Outcome: Progressing 12/18/2022 0323 by Lamount Cohen, RN Outcome: Progressing   Problem: Elimination: Goal: Will not experience complications related to bowel motility Outcome: Progressing Goal: Will not experience complications related to urinary retention Outcome: Progressing   Problem: Pain Management: Goal: General experience of comfort will improve Outcome: Progressing   Problem: Safety: Goal: Ability to remain free from injury will improve Outcome: Progressing   Problem: Skin Integrity: Goal: Risk for impaired skin integrity will decrease Outcome: Progressing

## 2022-12-18 NOTE — TOC Progression Note (Signed)
Transition of Care [facility]) - Progression Note    Patient Details  Name: [name] MRN: [id number] Date of Birth: November 08, 1945  Transition of Care [facility]) CM/SW Contact  Chapman Fitch, RN Phone Number: 12/18/2022, 3:34 PM  Clinical Narrative:    Therapy recommending home health  Wife at bedside.  Wife and patient in agreement.  Provided CMS Medicare.gov Compare Post Acute Care list.  They will review and make a decision on which agency they would like to use.   Therapy recommending RW.  TOC to obtain closer to discharge    Expected Discharge Plan: Home w Home Health Services    Expected Discharge Plan and Services                                               Social Determinants of Health (SDOH) Interventions SDOH Screenings   Food Insecurity: No Food Insecurity (12/16/2022)  Housing: Low Risk  (12/16/2022)  Transportation Needs: No Transportation Needs (12/16/2022)  Utilities: Not At Risk (12/16/2022)  Tobacco Use: Medium Risk (12/17/2022)    Readmission Risk Interventions    12/17/2022   11:32 AM  Readmission Risk Prevention Plan  Transportation Screening Complete  Home Care Screening Complete  Medication Review (RN CM) Complete

## 2022-12-18 NOTE — Consult Note (Addendum)
Hematology/Oncology Consult note [facility] [phone] [phone]  Patient Care Team: [name], MD as PCP - General (Internal Medicine) Carmina Miller, MD as Radiation Oncologist (Radiation Oncology)   Name of the patient: [name]  [id number]  06-Jun-1945    Reason for consult: concern for multiple myeloma   Requesting physician:DR. 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  Most recently he was gettingHyperbaric oxygen therapy for cystitis.  He was also seen by nephrology as an outpatient for chronic kidney disease and was found to have elevated free light chain and therefore referred to hematology.  In my clinic yesterday he was noted to have a creatinine of 9 which went up from 2.5 a month ago.  And therefore patient was admitted to the hospital.  Oncology consulted for concerns of possible multiple myeloma  ECOG PS- 3  Pain scale- 0   Review of systems- Review of Systems  Constitutional:  Positive for malaise/fatigue and weight loss. Negative for chills and fever.  HENT:  Negative for congestion, ear discharge and nosebleeds.   Eyes:  Negative for blurred vision.  Respiratory:  Negative for cough, hemoptysis, sputum production, shortness of breath and wheezing.   Cardiovascular:  Negative for chest pain, palpitations, orthopnea and claudication.  Gastrointestinal:  Negative for abdominal pain, blood in stool, constipation, diarrhea, heartburn, melena, nausea and vomiting.  Genitourinary:  Negative for dysuria, flank pain, frequency, hematuria and urgency.  Musculoskeletal:  Negative for back pain, joint pain and myalgias.  Skin:  Negative for rash.  Neurological:  Negative for dizziness, tingling, focal  weakness, seizures, weakness and headaches.  Endo/Heme/Allergies:  Does not bruise/bleed easily.  Psychiatric/Behavioral:  Negative for depression and suicidal ideas. The patient does not have insomnia.     Allergies  Allergen Reactions   Cidex [Glutaral] Anaphylaxis   Other Anaphylaxis    - Ortho-phthalaldehyde  - trospion cause AMS    Patient Active Problem List   Diagnosis Date Noted   AKI (acute kidney injury) (HCC) 12/16/2022   Urinary retention 04/17/2022   Hyponatremia 04/17/2022   Acute kidney injury superimposed on chronic kidney disease (HCC) 04/17/2022   Hematuria 04/17/2022   Acute on chronic anemia 04/17/2022   Essential hypertension 04/17/2022   Leg edema, left 04/17/2022   Leukocytosis 04/17/2022   Abdominal aortic aneurysm without rupture (HCC) 09/18/2020   Residual hemorrhoidal skin tags 07/19/2019   FH: macular degeneration 10/05/2018   Elevated PSA 03/11/2018   Family history of prostate cancer in father 02/24/2018   Coronary artery disease involving native heart without angina pectoris 09/02/2017   Personal history of malignant neoplasm of bladder 08/28/2017   Presence of aortocoronary bypass graft 01/16/2014   Postsurgical aortocoronary bypass status 01/16/2014   Rosacea 01/03/2014   Cardiac disease 01/03/2014   Pure hypercholesterolemia 01/03/2014   History of cardiac catheterization 01/03/2014   Malignant neoplasm of bladder, unspecified (HCC) 10/04/2013   Increased frequency of urination 02/07/2013   ED (erectile dysfunction) of organic origin 11/26/2011   Benign prostatic hyperplasia without lower urinary tract symptoms 11/26/2011   Bilateral renal cysts 11/26/2011   Anaphylaxis 07/24/2011     Past Medical History:  Diagnosis Date   Anemia    Aneurysm of infrarenal abdominal aorta (HCC)    a.) CTA AP 07/28/2019: measured 3.0 cm  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 4v CABG on 04/13/2002. b.) LHC 02/03/2013 --> LVEF 50%; severe 3v CAD with patent LIMA-LAD and SVG-OM1 grafts; SVG-PDA and SVG-DG1 occluded with collaterals to PDA and DG1 from LAD.   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   a.) LIMA-LAD, SVG-OM1, SVG-D1, SVG-PDA   Tubular adenoma of colon 02/03/2007     Past Surgical History:  Procedure Laterality Date   CARDIAC CATHETERIZATION Left 04/13/2002   Procedure: CARDIAC CATHETERIZATION; Location: [facility]; Surgeon; Marcina Millard, MD   CARDIAC CATHETERIZATION Left 02/03/2013   Procedure: CARDIAC CATHETERIZATION; Location: [facility]; Surgeon: Marcina Millard, MD   COLONOSCOPY W/ POLYPECTOMY     COLONOSCOPY WITH PROPOFOL N/A 04/09/2015   Procedure: COLONOSCOPY WITH PROPOFOL;  Surgeon: Wallace Cullens, MD;  Location: [facility];  Service: Gastroenterology;  Laterality: N/A;   CORONARY ANGIOPLASTY     CORONARY ARTERY BYPASS GRAFT N/A 04/13/2002   Procedure: 4v CABG (LIMA-LAD, SVG-OM1, SVG-D1, SVG-PDA); Location: [facility]; Surgeon: Rana Snare, MD   CYSTOSCOPY N/A 07/17/2021   Procedure: Bluford Kaufmann;  Surgeon: [name], MD;  Location: [facility];  Service: Urology;  Laterality: N/A;   CYSTOSCOPY N/A 06/24/2022   Procedure: DIAGNOSTIC CYSTOSCOPY;  Surgeon: [name], MD;  Location: [facility];  Service: Urology;  Laterality: N/A;   CYSTOSCOPY WITH BIOPSY N/A 07/17/2021   Procedure: CYSTOSCOPY WITH BIOPSY;  Surgeon: [name], MD;  Location: [facility];  Service: Urology;   Laterality: N/A;   CYSTOSCOPY WITH BIOPSY N/A 06/24/2022   Procedure: CYSTOSCOPY WITH BLADDER BIOPSY;  Surgeon: [name], MD;  Location: [facility];  Service: Urology;  Laterality: N/A;   CYSTOSCOPY WITH FULGERATION N/A 07/17/2021   Procedure: CYSTOSCOPY WITH FULGERATION;  Surgeon: [name], MD;  Location: [facility];  Service: Urology;  Laterality: N/A;   CYSTOSCOPY WITH FULGERATION N/A 06/24/2022   Procedure: CYSTOSCOPY WITH FULGERATION;  Surgeon: [name], MD;  Location: [facility];  Service: Urology;  Laterality: N/A;   EYE SURGERY     both eyes with cataract with lens 2021   IR BONE MARROW BIOPSY & ASPIRATION  12/17/2022   TONSILLECTOMY     TRANSURETHRAL RESECTION OF BLADDER TUMOR N/A 11/13/2020   Procedure: TRANSURETHRAL RESECTION OF BLADDER TUMOR (TURBT);  Surgeon: [name], MD;  Location: [facility];  Service: Urology;  Laterality: N/A;   TRANSURETHRAL RESECTION OF BLADDER TUMOR N/A 06/24/2022   Procedure: TRANSURETHRAL RESECTION OF BLADDER TUMOR (TURBT);  Surgeon: [name], MD;  Location: [facility];  Service: Urology;  Laterality: N/A;   TRANSURETHRAL RESECTION OF BLADDER TUMOR WITH GYRUS (TURBT-GYRUS)  06/24/2022   TRANSURETHRAL RESECTION OF PROSTATE      Social History   Socioeconomic History   Marital status: Married    Spouse name: [name]   Number of children: Not  on file   Years of education: Not on file   Highest education level: Not on file  Occupational History   Not on file  Tobacco Use   Smoking status: Former    Current packs/day: 0.00    Average packs/day: 0.8 packs/day for 60.0 years (45.0 ttl pk-yrs)    Types: Cigarettes    Start date: 63    Quit date: 2008    Years since quitting: 16.8    Passive exposure: Past   Smokeless tobacco: Never  Substance and Sexual Activity   Alcohol use: Yes    Alcohol/week: 2.0 standard drinks of alcohol    Types: 2 Standard drinks or equivalent per week    Comment: 14/week   Drug use: No    Sexual activity: Not on file  Other Topics Concern   Not on file  Social History Narrative   Not on file   Social Determinants of Health   Financial Resource Strain: Not on file  Food Insecurity: No Food Insecurity (12/16/2022)   Hunger Vital Sign    Worried About Running Out of Food in the Last Year: Never true    Ran Out of Food in the Last Year: Never true  Transportation Needs: No Transportation Needs (12/16/2022)   PRAPARE - Administrator, Civil Service (Medical): No    Lack of Transportation (Non-Medical): No  Physical Activity: Not on file  Stress: Not on file  Social Connections: Not on file  Intimate Partner Violence: Not At Risk (12/16/2022)   Humiliation, Afraid, Rape, and Kick questionnaire    Fear of Current or Ex-Partner: No    Emotionally Abused: No    Physically Abused: No    Sexually Abused: No     Family History  Problem Relation Age of Onset   Prostate cancer Father        Metastatic   Cancer Father      Current Facility-Administered Medications:    atorvastatin (LIPITOR) tablet 10 mg, 10 mg, Oral, Daily, Djan, Prince T, MD, 10 mg at 12/18/22 8295   Chlorhexidine Gluconate Cloth 2 % PADS 6 each, 6 each, Topical, Daily, Georgeann Oppenheim, Sudheer B, MD, 6 each at 12/18/22 0959   feeding supplement (ENSURE ENLIVE / ENSURE PLUS) liquid 237 mL, 237 mL, Oral, BID BM, Djan, Prince T, MD, 237 mL at 12/18/22 0959   [START ON 12/19/2022] ferrous sulfate tablet 324 mg, 324 mg, Oral, Q breakfast, Sreenath, Sudheer B, MD   fluticasone (FLONASE) 50 MCG/ACT nasal spray 1 spray, 1 spray, Each Nare, PRN, Sreenath, Sudheer B, MD   loratadine (CLARITIN) tablet 10 mg, 10 mg, Oral, Daily, Sreenath, Sudheer B, MD   melatonin tablet 2.5 mg, 2.5 mg, Oral, QHS PRN, Mansy, Jan A, MD, 2.5 mg at 12/18/22 0019   morphine (PF) 2 MG/ML injection 2 mg, 2 mg, Intravenous, Q2H PRN, Andris Baumann, MD, 2 mg at 12/17/22 0227   multivitamin with minerals tablet 1 tablet, 1 tablet,  Oral, Daily, Sreenath, Sudheer B, MD, 1 tablet at 12/18/22 0959   sodium bicarbonate 150 mEq in dextrose 5 % 1,150 mL infusion, , Intravenous, Continuous, Ray, Neha, MD, Last Rate: 75 mL/hr at 12/18/22 0534, New Bag at 12/18/22 0534   Physical exam:  Vitals:   12/17/22 1516 12/17/22 2019 12/18/22 0336 12/18/22 0754  BP: (!) 102/50 (!) 114/56 (!) 104/52 98/60  Pulse: 100 (!) 109 95 94  Resp: 20 18 20 16   Temp: 98.7 F (37.1 C) 99.8 F (37.7 C)  98.5 F (36.9 C) 98.2 F (36.8 C)  TempSrc:  Oral Oral   SpO2: 96% 97% 97% 98%  Weight:      Height:       Physical Exam Constitutional:      Comments: Appears fatigued and deconditioned  Cardiovascular:     Rate and Rhythm: Normal rate and regular rhythm.     Heart sounds: Normal heart sounds.  Pulmonary:     Effort: Pulmonary effort is normal.     Breath sounds: Normal breath sounds.  Abdominal:     General: Bowel sounds are normal.     Palpations: Abdomen is soft.     Comments: Foley draining bloody urine  Skin:    General: Skin is warm and dry.  Neurological:     Mental Status: He is oriented to person, place, and time.           Latest Ref Rng & Units 12/18/2022    8:19 AM  CMP  Glucose 70 - 99 mg/dL 191   BUN 8 - 23 mg/dL 478   Creatinine 2.95 - 1.24 mg/dL 6.21   Sodium 308 - 657 mmol/L 129   Potassium 3.5 - 5.1 mmol/L 4.0   Chloride 98 - 111 mmol/L 100   CO2 22 - 32 mmol/L 18   Calcium 8.9 - 10.3 mg/dL 8.0       Latest Ref Rng & Units 12/18/2022    8:19 AM  CBC  WBC 4.0 - 10.5 K/uL 10.7   Hemoglobin 13.0 - 17.0 g/dL 7.4   Hematocrit 84.6 - 52.0 % 21.2   Platelets 150 - 400 K/uL 253     @IMAGES @  NM PET Image Initial (PI) Whole Body (F-18 FDG)  Result Date: 12/17/2022 CLINICAL DATA:  Initial treatment strategy for multiple myeloma. Prior history of bladder carcinoma and prostate carcinoma. EXAM: NUCLEAR MEDICINE PET WHOLE BODY TECHNIQUE: 7.8 mCi F-18 FDG was injected intravenously. Full-ring PET imaging  was performed from the head to foot after the radiotracer. CT data was obtained and used for attenuation correction and anatomic localization. Fasting blood glucose: 97 mg/dl COMPARISON:  None Available. FINDINGS: Mediastinal blood-pool activity (background): SUV max = 2.7 Liver activity (reference): SUV max = N/A NECK:  No hypermetabolic lymph nodes or masses. Incidental CT findings:  None. CHEST: No hypermetabolic lymph nodes. No suspicious pulmonary nodules seen on CT images. Incidental CT findings:  None. ABDOMEN/PELVIS: No abnormal hypermetabolic activity within the liver, pancreas, adrenal glands, or spleen. No hypermetabolic lymph nodes in the abdomen or pelvis. Incidental CT findings: Mildly enlarged prostate gland. Foley catheter seen within bladder. Diffuse bladder wall thickening is seen, likely due to chronic bladder outlet obstruction or cystitis. Mild moderate bilateral hydroureteronephrosis is seen to the level of the bladder, likely due to vesicoureteral reflux. SKELETON: No focal lytic or sclerotic bone lesions are seen on CT images. No focal hypermetabolic bone lesions. Incidental CT findings:  None. EXTREMITIES: No focal hypermetabolic bone lesions identified. No focal lytic or sclerotic bone lesion seen on CT images. Incidental CT findings: none IMPRESSION: No focal hypermetabolic or lytic bone lesions identified. Electronically Signed   By: Danae Orleans M.D.   On: 12/17/2022 15:39   IR BONE MARROW BIOPSY & ASPIRATION  Result Date: 12/17/2022 INDICATION: Multiple myeloma workup EXAM: Bone marrow aspiration and core biopsy using fluoroscopic guidance MEDICATIONS: None. ANESTHESIA/SEDATION: Moderate (conscious) sedation was employed during this procedure. A total of Versed 0.5 mg and Fentanyl 25 mcg was administered intravenously. Moderate Sedation Time:  10 minutes. The patient's level of consciousness and vital signs were monitored continuously by radiology nursing throughout the procedure  under my direct supervision. FLUOROSCOPY TIME:  Fluoroscopy Time: 0.8 minutes (3 mGy) COMPLICATIONS: None immediate. PROCEDURE: Informed written consent was obtained from the patient after a thorough discussion of the procedural risks, benefits and alternatives. All questions were addressed. Maximal Sterile Barrier Technique was utilized including caps, mask, sterile gowns, sterile gloves, sterile drape, hand hygiene and skin antiseptic. A timeout was performed prior to the initiation of the procedure. The patient was placed prone on the exam table. Limited fluoroscopy of the pelvis was performed for planning purposes. Skin entry site was marked, and the overlying skin was prepped and draped in the standard sterile fashion. Local analgesia was obtained with 1% lidocaine. Using fluoroscopic guidance, an 11 gauge needle was advanced just deep to the cortex of the right posterior ilium. Subsequently, bone marrow aspiration and core biopsy were performed. Specimens were submitted to lab/pathology for handling. Hemostasis was achieved with manual pressure, and a clean dressing was placed. The patient tolerated the procedure well without immediate complication. IMPRESSION: Successful bone marrow aspiration and core biopsy of the right posterior ilium using fluoroscopic guidance. Electronically Signed   By: Olive Bass M.D.   On: 12/17/2022 11:54   US Renal  Result Date: 12/16/2022 CLINICAL DATA:  Acute on chronic renal failure. EXAM: RENAL / URINARY TRACT ULTRASOUND COMPLETE COMPARISON:  April 17, 2022 FINDINGS: Right Kidney: Renal measurements: 9.7 cm x 5.8 cm x 5.0 cm = volume: 146 mL. There is diffusely increased echogenicity of the renal parenchyma. A 2.6 cm x 2.5 cm x 3.0 cm and 2.5 cm x 1.9 cm x 2.1 cm cysts are seen within the right kidney. There is mild right-sided hydronephrosis. Left Kidney: Renal measurements: 10.6 cm x 6.2 cm x 6.0 cm = volume: 207 mL. There is diffusely increased echogenicity of  the renal parenchyma. 1.7 cm x 1.6 cm x 2.0 cm and 4.3 cm x 3.7 cm x 5.4 cm simple cysts are seen within the left kidney. There is moderate severity left-sided hydronephrosis. Bladder: A Foley catheter is present. Other: None. IMPRESSION: 1. Bilateral echogenic kidneys, likely secondary to medical renal disease. 2. Multiple large, bilateral simple renal cysts. 3. Bilateral hydronephrosis, left greater than right. Electronically Signed   By: Aram Candela M.D.   On: 12/16/2022 21:01    Assessment and plan- Patient is a 77 y.o. male admitted for acute kidney injury and anemia with concerns for possible multiple myeloma  Patient had a bone marrow biopsy today the results of which are pending.  As per my conversation with Dr. Kenyon Ana from pathology bone marrow aspirate shows roughly 15% plasma cells.  His free light chain ratio is elevated at 13.  Free light chain ratio can be sometimes altered in renal disease unrelated to multiple myeloma.  If we were to think that he has cast nephropathy from free light chains we would have expected his free light chain ratio to be way higher (>100).  Also PET scan does not show any evidence of lytic lesions.no hypercalcemia.  Multiple myeloma panel is currently pending.  Anemia can be seen in the setting of kidney disease as well.  Overall as of now there is not a convincing picture that this is overt multiple myeloma.  I have discussed his case with Dr. Wynelle Link and I recommend getting kidney biopsy to confirm that he has cast nephropathy from multiple myeloma before we can treat  him presumptively for multiple myeloma.  Also given that his renal ultrasound showed mild right hydronephrosis and moderate left hydronephrosis I would like to have urology on board to see if we are dealing with any postobstructive cause of acute kidney injury.  Management of AKI as per nephrology team.  I will not be proceeding with any multiple myeloma treatment at this time unless we have  answers from kidney biopsy in the absence of other corroborative evidence for multiple myeloma   Visit Diagnosis 1. Acute renal failure superimposed on chronic kidney disease, unspecified acute renal failure type, unspecified CKD stage (HCC)   2. Hyponatremia   3. Hyperkalemia   4. Anemia, unspecified type     Dr. Owens Shark, MD, MPH [facility] [phone] 12/18/2022

## 2022-12-18 NOTE — Progress Notes (Signed)
[facility]  ROUNDING NOTE   Subjective:   Pateint was last seen in nephrology office on 9/12 for work up for worsening renal function and nephrotic range proteinuria and hematuria.  Patient seen laying in bed Wife at bedside Patient very somnolent, arousable for short periods  Objective:  Vital signs in last 24 hours:  Temp:  [98.2 F (36.8 C)-99.8 F (37.7 C)] 98.2 F (36.8 C) (10/31 0754) Pulse Rate:  [94-109] 94 (10/31 0754) Resp:  [16-20] 16 (10/31 0754) BP: (98-114)/(50-60) 98/60 (10/31 0754) SpO2:  [96 %-98 %] 98 % (10/31 0754)  Weight change: 0.024 kg Filed Weights   12/16/22 1900 12/17/22 0826  Weight: 63.8 kg 63.8 kg    Intake/Output: I/O last 3 completed shifts: In: 821.7 [P.O.:60; I.V.:761.7] Out: 2950 [Urine:2950]   Intake/Output this shift:  No intake/output data recorded.  Physical Exam: General: Somnolent  Head: Normocephalic, atraumatic. Moist oral mucosal membranes  Eyes: Anicteric  Lungs:  Clear to auscultation, normal effort  Heart: Regular rate and rhythm  Abdomen:  Soft, nontender  Extremities:  no peripheral edema.  Neurologic: Somnolent, arouable for short periods  Skin: No lesions  Access: none    Basic Metabolic Panel: Recent Labs  Lab 12/16/22 1227 12/16/22 1513 12/17/22 0436 12/18/22 0819  NA 124* 121* 125* 129*  K 6.0* 5.7* 4.4 4.0  CL 100 99 101 100  CO2 11* 12* 13* 18*  GLUCOSE 124* 136* 143* 119*  BUN 144* 133* 132* 108*  CREATININE 9.00* 9.17* 8.43* 6.68*  CALCIUM 9.1 8.7* 8.0* 8.0*  MG  --  3.2*  --   --     Liver Function Tests: Recent Labs  Lab 12/16/22 1227 12/16/22 1513  AST 8* 9*  ALT 13 12  ALKPHOS 67 70  BILITOT 0.5 0.7  PROT 8.9* 8.7*  ALBUMIN 3.5 3.2*   No results for input(s): "LIPASE", "AMYLASE" in the last 168 hours. No results for input(s): "AMMONIA" in the last 168 hours.  CBC: Recent Labs  Lab 12/16/22 1227 12/16/22 1513 12/17/22 0436 12/18/22 0819  WBC 14.2* 12.7*  23.9* 10.7*  NEUTROABS 12.0* 11.0* 22.4* 9.1*  HGB 9.0* 9.3* 7.4* 7.4*  HCT 27.6* 28.1* 21.3* 21.2*  MCV 98.6 96.9 94.7 92.2  PLT 332 327 258 253    Cardiac Enzymes: No results for input(s): "CKTOTAL", "CKMB", "CKMBINDEX", "TROPONINI" in the last 168 hours.  BNP: Invalid input(s): "POCBNP"  CBG: Recent Labs  Lab 12/17/22 1147  GLUCAP 97    Microbiology: Results for orders placed or performed in visit on 10/01/22  Microscopic Examination     Status: Abnormal   Collection Time: 10/01/22  2:14 PM   Urine  Result Value Ref Range Status   WBC, UA >30 (A) 0 - 5 /hpf Final   RBC, Urine 11-30 (A) 0 - 2 /hpf Final   Epithelial Cells (non renal) 0-10 0 - 10 /hpf Final   Bacteria, UA Moderate (A) None seen/Few Final  CULTURE, URINE COMPREHENSIVE     Status: Abnormal   Collection Time: 10/01/22  3:33 PM   Specimen: Urine   UR  Result Value Ref Range Status   Urine Culture, Comprehensive Final report (A)  Final   Organism ID, Bacteria Escherichia coli (A)  Final    Comment: Cefazolin <=4 ug/mL Cefazolin with an MIC <=16 predicts susceptibility to the oral agents cefaclor, cefdinir, cefpodoxime, cefprozil, cefuroxime, cephalexin, and loracarbef when used for therapy of uncomplicated urinary tract infections due to E. coli, Klebsiella pneumoniae,  and Proteus mirabilis. 50,000-100,000 colony forming units per mL    ANTIMICROBIAL SUSCEPTIBILITY Comment  Final    Comment:       ** S = Susceptible; I = Intermediate; R = Resistant **                    P = Positive; N = Negative             MICS are expressed in micrograms per mL    Antibiotic                 RSLT#1    RSLT#2    RSLT#3    RSLT#4 Amoxicillin/Clavulanic Acid    S Ampicillin                     S Cefepime                       S Ceftriaxone                    S Cefuroxime                     S Ciprofloxacin                  S Ertapenem                      S Gentamicin                     R Imipenem                        S Levofloxacin                   I Meropenem                      S Nitrofurantoin                 S Piperacillin/Tazobactam        S Tetracycline                   R Tobramycin                     I Trimethoprim/Sulfa             R     Coagulation Studies: No results for input(s): "LABPROT", "INR" in the last 72 hours.  Urinalysis: Recent Labs    12/16/22 1513  COLORURINE RED*  LABSPEC TEST NOT REPORTED DUE TO COLOR INTERFERENCE OF URINE PIGMENT  PHURINE TEST NOT REPORTED DUE TO COLOR INTERFERENCE OF URINE PIGMENT  GLUCOSEU TEST NOT REPORTED DUE TO COLOR INTERFERENCE OF URINE PIGMENT*  HGBUR TEST NOT REPORTED DUE TO COLOR INTERFERENCE OF URINE PIGMENT*  BILIRUBINUR TEST NOT REPORTED DUE TO COLOR INTERFERENCE OF URINE PIGMENT*  KETONESUR TEST NOT REPORTED DUE TO COLOR INTERFERENCE OF URINE PIGMENT*  PROTEINUR TEST NOT REPORTED DUE TO COLOR INTERFERENCE OF URINE PIGMENT*  NITRITE TEST NOT REPORTED DUE TO COLOR INTERFERENCE OF URINE PIGMENT*  LEUKOCYTESUR TEST NOT REPORTED DUE TO COLOR INTERFERENCE OF URINE PIGMENT*      Imaging: NM PET Image Initial (PI) Whole Body (F-18 FDG)  Result Date: 12/17/2022 CLINICAL DATA:  Initial treatment strategy for multiple myeloma. Prior history of bladder carcinoma and prostate carcinoma. EXAM: NUCLEAR  MEDICINE PET WHOLE BODY TECHNIQUE: 7.8 mCi F-18 FDG was injected intravenously. Full-ring PET imaging was performed from the head to foot after the radiotracer. CT data was obtained and used for attenuation correction and anatomic localization. Fasting blood glucose: 97 mg/dl COMPARISON:  None Available. FINDINGS: Mediastinal blood-pool activity (background): SUV max = 2.7 Liver activity (reference): SUV max = N/A NECK:  No hypermetabolic lymph nodes or masses. Incidental CT findings:  None. CHEST: No hypermetabolic lymph nodes. No suspicious pulmonary nodules seen on CT images. Incidental CT findings:  None. ABDOMEN/PELVIS: No abnormal  hypermetabolic activity within the liver, pancreas, adrenal glands, or spleen. No hypermetabolic lymph nodes in the abdomen or pelvis. Incidental CT findings: Mildly enlarged prostate gland. Foley catheter seen within bladder. Diffuse bladder wall thickening is seen, likely due to chronic bladder outlet obstruction or cystitis. Mild moderate bilateral hydroureteronephrosis is seen to the level of the bladder, likely due to vesicoureteral reflux. SKELETON: No focal lytic or sclerotic bone lesions are seen on CT images. No focal hypermetabolic bone lesions. Incidental CT findings:  None. EXTREMITIES: No focal hypermetabolic bone lesions identified. No focal lytic or sclerotic bone lesion seen on CT images. Incidental CT findings: none IMPRESSION: No focal hypermetabolic or lytic bone lesions identified. Electronically Signed   By: Danae Orleans M.D.   On: 12/17/2022 15:39   IR BONE MARROW BIOPSY & ASPIRATION  Result Date: 12/17/2022 INDICATION: Multiple myeloma workup EXAM: Bone marrow aspiration and core biopsy using fluoroscopic guidance MEDICATIONS: None. ANESTHESIA/SEDATION: Moderate (conscious) sedation was employed during this procedure. A total of Versed 0.5 mg and Fentanyl 25 mcg was administered intravenously. Moderate Sedation Time: 10 minutes. The patient's level of consciousness and vital signs were monitored continuously by radiology nursing throughout the procedure under my direct supervision. FLUOROSCOPY TIME:  Fluoroscopy Time: 0.8 minutes (3 mGy) COMPLICATIONS: None immediate. PROCEDURE: Informed written consent was obtained from the patient after a thorough discussion of the procedural risks, benefits and alternatives. All questions were addressed. Maximal Sterile Barrier Technique was utilized including caps, mask, sterile gowns, sterile gloves, sterile drape, hand hygiene and skin antiseptic. A timeout was performed prior to the initiation of the procedure. The patient was placed prone on the  exam table. Limited fluoroscopy of the pelvis was performed for planning purposes. Skin entry site was marked, and the overlying skin was prepped and draped in the standard sterile fashion. Local analgesia was obtained with 1% lidocaine. Using fluoroscopic guidance, an 11 gauge needle was advanced just deep to the cortex of the right posterior ilium. Subsequently, bone marrow aspiration and core biopsy were performed. Specimens were submitted to lab/pathology for handling. Hemostasis was achieved with manual pressure, and a clean dressing was placed. The patient tolerated the procedure well without immediate complication. IMPRESSION: Successful bone marrow aspiration and core biopsy of the right posterior ilium using fluoroscopic guidance. Electronically Signed   By: Olive Bass M.D.   On: 12/17/2022 11:54   US Renal  Result Date: 12/16/2022 CLINICAL DATA:  Acute on chronic renal failure. EXAM: RENAL / URINARY TRACT ULTRASOUND COMPLETE COMPARISON:  April 17, 2022 FINDINGS: Right Kidney: Renal measurements: 9.7 cm x 5.8 cm x 5.0 cm = volume: 146 mL. There is diffusely increased echogenicity of the renal parenchyma. A 2.6 cm x 2.5 cm x 3.0 cm and 2.5 cm x 1.9 cm x 2.1 cm cysts are seen within the right kidney. There is mild right-sided hydronephrosis. Left Kidney: Renal measurements: 10.6 cm x 6.2 cm x 6.0 cm = volume:  207 mL. There is diffusely increased echogenicity of the renal parenchyma. 1.7 cm x 1.6 cm x 2.0 cm and 4.3 cm x 3.7 cm x 5.4 cm simple cysts are seen within the left kidney. There is moderate severity left-sided hydronephrosis. Bladder: A Foley catheter is present. Other: None. IMPRESSION: 1. Bilateral echogenic kidneys, likely secondary to medical renal disease. 2. Multiple large, bilateral simple renal cysts. 3. Bilateral hydronephrosis, left greater than right. Electronically Signed   By: Aram Candela M.D.   On: 12/16/2022 21:01     Medications:    sodium bicarbonate 150 mEq in  dextrose 5 % 1,150 mL infusion 75 mL/hr at 12/18/22 0534    atorvastatin  10 mg Oral Daily   Chlorhexidine Gluconate Cloth  6 each Topical Daily   feeding supplement  237 mL Oral BID BM   multivitamin with minerals  1 tablet Oral Daily   melatonin, morphine injection  Assessment/ Plan:  Mr. LUIE LEANG is a 77 y.o.  male with hypertension, AAA, hyperlipidemia, coronary artery disease status post CABG, bladder cancer, glaucoma who [facility] with Hyperkalemia [E87.5] Hyponatremia [E87.1] AKI (acute kidney injury) (HCC) [N17.9] Anemia, unspecified type [D64.9] Acute renal failure superimposed on chronic kidney disease, unspecified acute renal failure type, unspecified CKD stage (HCC) [N17.9, N18.9]  Acute Kidney Injury with hyperkalemia on Chronic kidney disease stage IV with proteinuria: concern for underlying multiple myeloma and cast nephropathy - Creatinine remains elevated but slowly improving. Urine output .  - Decreased mentation concerning for uremic symptoms. BUN has decreased to 108, will continue to monitor for now.  - Was considering initiation of RRT, but will hold for now and continue to monitor renal indices  Hyponatremia: hyperosmolar consistent with pseudohyponatremia from plasma cell proteins.  - Sodium 129 today  Acute metabolic acidosis: nonanion gap: consistent with renal tubular acidosis type IV.  - IV sodium bicarbonate infusion.   Anemia with chronic kidney disease: with leukocytosis. Concerning for multiple myeloma or other blood cell dyscrasia. Appreciate hematology input.     LOS: 2 Corneilus Heggie 10/31/202410:56 AM

## 2022-12-18 NOTE — Evaluation (Signed)
Occupational Therapy Evaluation Patient Details Name: [name] MRN: [id number] DOB: 1945/05/21 Today's Date: 12/18/2022   History of Present Illness 77 y.o. male with medical history significant of hypertension, hyperlipidemia, coronary artery disease status post CABG, history of bladder cancer and prostate cancer s/p radiation who follows up with urology closely.   Clinical Impression   Chart reviewed prior to session. Pt was seen for OT evaluation this date. Prior to hospital admission, pt was MOD I- indep in ADL/IADLs with recent decline in activity and no longer driving. Pt alert and oriented x4, with notable fear of movement and slow processing. Pt lives with his wife in one level home with a flight of stairs to enter. Pt presents to acute OT demonstrating impaired ADL performance and functional mobility 2/2 (See OT problem list for additional functional deficits). Pt currently requires MIN-MOD A all  mobility and step-by-step verbal cues to facilitate movement. Pt completed bed mobility requiring encouragement and MODA, for LE and trunk control. Pt took medication with nurse seated on EOB, demonstrating good static sitting ~5 mins. Pt completed STS + RW + MINA, step pivot to recliner with RW + MINA with continuous verbal cues to sequence. Pt left resting in recliner with wife at bed side. Pt would benefit from skilled OT services to address noted impairments and functional limitations (see below for any additional details) in order to maximize safety and independence while minimizing falls risk and caregiver burden. OT will follow acutely.      If plan is discharge home, recommend the following: A little help with walking and/or transfers;Assist for transportation;Assistance with cooking/housework;Direct supervision/assist for medications management;Help with stairs or ramp for entrance    Functional Status Assessment  Patient has had a recent decline in their functional status and  demonstrates the ability to make significant improvements in function in a reasonable and predictable amount of time.  Equipment Recommendations  Other (comment);BSC/3in1    Recommendations for Other Services       Precautions / Restrictions Precautions Precautions: Fall Restrictions Weight Bearing Restrictions: No      Mobility Bed Mobility Overal bed mobility: Needs Assistance Bed Mobility: Supine to Sit     Supine to sit: Mod assist, HOB elevated, Used rails     General bed mobility comments: MODA scooting to EOB    Transfers Overall transfer level: Needs assistance Equipment used: Rolling walker (2 wheels) Transfers: Sit to/from Stand, Bed to chair/wheelchair/BSC Sit to Stand: Min assist     Step pivot transfers: Min assist     General transfer comment: step by step verbal cues. Transfered EOB <> recliner with MINA + step by step verbal cues for sequencing.      Balance Overall balance assessment: Needs assistance Sitting-balance support: No upper extremity supported, Feet supported Sitting balance-Leahy Scale: Good     Standing balance support: Bilateral upper extremity supported, During functional activity, Reliant on assistive device for balance Standing balance-Leahy Scale: Poor Standing balance comment: Very fearfull on this date, will continue to assess                           ADL either performed or assessed with clinical judgement   ADL Overall ADL's : Needs assistance/impaired Eating/Feeding: Supervision/ safety Eating/Feeding Details (indicate cue type and reason): Drinking                 Lower Body Dressing: Maximal assistance Lower Body Dressing Details (indicate cue type and reason):  sitting on EOB             Functional mobility during ADLs: Minimal assistance;Cueing for sequencing;Rolling walker (2 wheels)       Vision Baseline Vision/History: 1 Wears glasses       Perception         Praxis          Pertinent Vitals/Pain Pain Assessment Pain Assessment: Faces Faces Pain Scale: Hurts little more Pain Location: L "thigh muscles" Pain Descriptors / Indicators: Aching, Grimacing Pain Intervention(s): Limited activity within patient's tolerance, Monitored during session     Extremity/Trunk Assessment Upper Extremity Assessment Upper Extremity Assessment: Generalized weakness;Overall [facility] for tasks assessed   Lower Extremity Assessment Lower Extremity Assessment: Generalized weakness;Overall [facility] for tasks assessed   Cervical / Trunk Assessment Cervical / Trunk Assessment: Normal   Communication Communication Communication: No apparent difficulties Cueing Techniques: Verbal cues;Tactile cues;Visual cues   Cognition Arousal: Alert Behavior During Therapy: Anxious Overall Cognitive Status: Impaired/Different from baseline Area of Impairment: Problem solving, Following commands                       Following Commands: Follows multi-step commands with increased time     Problem Solving: Slow processing, Difficulty sequencing General Comments: Wife reports this is much different than usual, "his thinking is very delayed"     General Comments  cath drain in tact pre/post session    Exercises Other Exercises Other Exercises: Edu: Role of OT, energy conservation, safe ADL completion   Shoulder Instructions      Home Living Family/patient expects to be discharged to:: Private residence Living Arrangements: Spouse/significant other Available Help at Discharge: Family Type of Home: House Home Access: Stairs to enter Secretary/administrator of Steps: flight Entrance Stairs-Rails: Right;Left;Can reach both Home Layout: One level     Bathroom Shower/Tub: Runner, broadcasting/film/video: None          Prior Functioning/Environment Prior Level of Function : Independent/Modified Independent             Mobility Comments: wife does most of the  driving, typically gets out of the house for MDs/eating a few times a week ADLs Comments: Indep, recently stop driving        OT Problem List: Decreased strength;Decreased activity tolerance;Decreased safety awareness;Impaired balance (sitting and/or standing);Decreased knowledge of use of DME or AE      OT Treatment/Interventions: Self-care/ADL training;Therapeutic exercise;Therapeutic activities;Neuromuscular education;Energy conservation;DME and/or AE instruction;Patient/family education;Balance training;Cognitive remediation/compensation    OT Goals(Current goals can be found in the care plan section) Acute Rehab OT Goals Patient Stated Goal: to return to PLOF OT Goal Formulation: With patient/family Time For Goal Achievement: 01/01/23 Potential to Achieve Goals: Good ADL Goals Pt Will Perform Grooming: with modified independence;standing Pt Will Perform Lower Body Dressing: with modified independence;sit to/from stand Pt Will Transfer to Toilet: with modified independence;ambulating;regular height toilet Pt Will Perform Toileting - Clothing Manipulation and hygiene: with modified independence;sit to/from stand  OT Frequency: Min 1X/week    Co-evaluation              AM-PAC OT "6 Clicks" Daily Activity     Outcome Measure Help from another person eating meals?: A Little Help from another person taking care of personal grooming?: A Little Help from another person toileting, which includes using toliet, bedpan, or urinal?: A Little Help from another person bathing (including washing, rinsing, drying)?: A Lot Help from another  person to put on and taking off regular upper body clothing?: None Help from another person to put on and taking off regular lower body clothing?: A Little 6 Click Score: 14   End of Session Equipment Utilized During Treatment: Gait belt;Rolling walker (2 wheels) Nurse Communication: Mobility status  Activity Tolerance: Patient tolerated treatment  well Patient left: in chair;with call bell/phone within reach;with chair alarm set;with family/visitor present;with nursing/sitter in room  OT Visit Diagnosis: Other abnormalities of gait and mobility (R26.89);Muscle weakness (generalized) (M62.81)                Time: 1610-9604 OT Time Calculation (min): 25 min Charges:  OT General Charges $OT Visit: 1 Visit OT Evaluation $OT Eval Moderate Complexity: 1 Mod OT Treatments $Therapeutic Activity: 8-22 mins  [employer], OTS

## 2022-12-18 NOTE — Evaluation (Signed)
Physical Therapy Evaluation Patient Details Name: [name] MRN: [id number] DOB: May 10, 1945 Today's Date: 12/18/2022  History of Present Illness  77 y.o. male with medical history significant of hypertension, hyperlipidemia, coronary artery disease status post CABG, history of bladder cancer and prostate cancer s/p radiation who follows up with urology closely.  Clinical Impression  Pt initially with some hesitancy to really do a lot, but with encouragement agreed and was able to ambulate ~45 ft with slow, walker reliant gait.  He is not close to his baseline both functionally and cognitively but he and wife endorse that he is feeling better today than on arrival.  Pt reports generally feeling quite poorly and c/o gas/abdominal pain.  He does have a flight of steps to get in/out of the home, pt will need to show increased mobility/activity tolerance but hopes to improve enough to be able to d/c home when medically ready to do so.        If plan is discharge home, recommend the following: A little help with walking and/or transfers;A little help with bathing/dressing/bathroom;Assistance with feeding;Assist for transportation   Can travel by private vehicle        Equipment Recommendations Rolling walker (2 wheels)  Recommendations for Other Services       Functional Status Assessment Patient has had a recent decline in their functional status and demonstrates the ability to make significant improvements in function in a reasonable and predictable amount of time.     Precautions / Restrictions Precautions Precautions: Fall Restrictions Weight Bearing Restrictions: No      Mobility  Bed Mobility               General bed mobility comments: in recliner pre/post session, not tested    Transfers Overall transfer level: Needs assistance Equipment used: Rolling walker (2 wheels) Transfers: Sit to/from Stand, Bed to chair/wheelchair/BSC Sit to Stand: Min assist, Contact  guard assist           General transfer comment: Pt needed extra cuing for set up (U&LEs) and sequencing,very light assist to shift weight forward and insure continued upward momentum    Ambulation/Gait Ambulation/Gait assistance: Contact guard assist, Min assist Gait Distance (Feet): 45 Feet Assistive device: Rolling walker (2 wheels)         General Gait Details: Pt typically does not use walker but heavily reliant on it today.  He was able to maintain a slow cadence with heavy UE/walker reliance.  Good effort, SpO2 remained in the mid/high 90s on room air, HR to the 80s.  Stairs            Wheelchair Mobility     Tilt Bed    Modified Rankin (Stroke Patients Only)       Balance Overall balance assessment: Needs assistance Sitting-balance support: No upper extremity supported, Feet supported Sitting balance-Leahy Scale: Good     Standing balance support: Bilateral upper extremity supported, During functional activity, Reliant on assistive device for balance Standing balance-Leahy Scale: Fair Standing balance comment: reliant on the walker/UEs but no LOBs                             Pertinent Vitals/Pain Pain Assessment Pain Assessment: Faces Faces Pain Scale: Hurts little more Pain Location: abdominal/gas pain/tightness    Home Living Family/patient expects to be discharged to:: Private residence Living Arrangements: Spouse/significant other Available Help at Discharge: Family Type of Home: House Home Access: Stairs to  enter Entrance Stairs-Rails: Right;Left;Can reach both Entrance Stairs-Number of Steps: flight   Home Layout: One level Home Equipment: None      Prior Function Prior Level of Function : Independent/Modified Independent             Mobility Comments: wife does most of the driving, typically gets out of the house for MDs/eating a few times a week ADLs Comments: Indep, recently stop driving     Extremity/Trunk  Assessment   Upper Extremity Assessment Upper Extremity Assessment: Generalized weakness;Overall [facility] for tasks assessed    Lower Extremity Assessment Lower Extremity Assessment: Generalized weakness;Overall [facility] for tasks assessed    Cervical / Trunk Assessment Cervical / Trunk Assessment: Normal  Communication   Communication Communication: No apparent difficulties Cueing Techniques: Verbal cues;Tactile cues;Visual cues  Cognition Arousal: Alert Behavior During Therapy: Anxious Overall Cognitive Status: Impaired/Different from baseline Area of Impairment: Problem solving, Following commands                       Following Commands: Follows multi-step commands with increased time     Problem Solving: Slow processing, Difficulty sequencing General Comments: off from baseline, delayed but oriented        General Comments General comments (skin integrity, edema, etc.): Pt is far from his baseline, but with an AD he was able to do some limited ambulation    Exercises     Assessment/Plan    PT Assessment Patient needs continued PT services  PT Problem List Decreased strength;Decreased range of motion;Decreased activity tolerance;Decreased balance;Decreased safety awareness;Decreased mobility;Decreased cognition;Decreased knowledge of use of DME       PT Treatment Interventions Gait training;Functional mobility training;Therapeutic activities;Therapeutic exercise;Balance training;Neuromuscular re-education;Cognitive remediation;Patient/family education;Stair training;DME instruction    PT Goals (Current goals can be found in the Care Plan section)  Acute Rehab PT Goals Patient Stated Goal: eat something PT Goal Formulation: With patient Time For Goal Achievement: 12/18/22 Potential to Achieve Goals: Good    Frequency Min 1X/week     Co-evaluation               AM-PAC PT "6 Clicks" Mobility  Outcome Measure Help needed turning from your back to your  side while in a flat bed without using bedrails?: A Little Help needed moving from lying on your back to sitting on the side of a flat bed without using bedrails?: A Little Help needed moving to and from a bed to a chair (including a wheelchair)?: A Little Help needed standing up from a chair using your arms (e.g., wheelchair or bedside chair)?: A Little Help needed to walk in hospital room?: A Lot Help needed climbing 3-5 steps with a railing? : A Lot 6 Click Score: 16    End of Session Equipment Utilized During Treatment: Gait belt Activity Tolerance: Patient tolerated treatment well Patient left: with chair alarm set;with call bell/phone within reach Nurse Communication: Mobility status PT Visit Diagnosis: Muscle weakness (generalized) (M62.81);Difficulty in walking, not elsewhere classified (R26.2)    Time: 2952-8413 PT Time Calculation (min) (ACUTE ONLY): 32 min   Charges:   PT Evaluation $PT Eval Low Complexity: 1 Low PT Treatments $Gait Training: 8-22 mins PT General Charges $$ ACUTE PT VISIT: 1 Visit         Malachi Pro, DPT 12/18/2022, 1:28 PM

## 2022-12-18 NOTE — Consult Note (Signed)
Urology Consult  I have been asked to see the patient by Dr. Tresa Moore, for evaluation and management of bilateral hydronephrosis and gross hematuria with clots.    Chief Complaint: Bilateral hydronephrosis w/gross hematuria and clots associated with AKI and hyperkalemia.   History of Present Illness: [name] is a 77 y.o. 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 on December 16, 2022 for plasma cell dyscrasia.  At the visit, she obtain blood work and it was noted that his creatinine was 9 his potassium was 6 and at that time he was instructed to seek further evaluation in the emergency department.    Upon presentation to the ED, his temperature was found to be 95.6, hypotensive, hyponatremic, hyperkalemic with severe metabolic acidosis secondary to acute renal failure.  Renal ultrasound was performed which noted bilateral hydronephrosis with left being greater than right..    Staff reported that the Foley catheter was placed during this admission.  They are not aware of the residual that was obtained once the Foley catheter was placed.   They also reported gross hematuria associated with several clots throughout the night and early a.m.  Patient remains confused this morning and somnolent, but he is easily arousable.    VSS, soft BP  WBC count down from 23.9 yesterday to 10.7 this morning.  His serum creatinine has decreased from 8.43 yesterday to 6.68 this morning.    Urine in Foley is now yellow cloudy.  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 4v CABG on 04/13/2002. b.) LHC 02/03/2013 --> LVEF 50%; severe 3v CAD with patent LIMA-LAD and SVG-OM1 grafts; SVG-PDA and SVG-DG1 occluded with collaterals to PDA and DG1 from LAD.   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   a.) LIMA-LAD, SVG-OM1, SVG-D1, SVG-PDA   Tubular adenoma of colon 02/03/2007    Past Surgical History:  Procedure Laterality Date   CARDIAC CATHETERIZATION Left 04/13/2002   Procedure: CARDIAC CATHETERIZATION; Location: [facility]; Surgeon; Marcina Millard, MD   CARDIAC CATHETERIZATION Left 02/03/2013   Procedure: CARDIAC CATHETERIZATION; Location: [facility]; Surgeon: Marcina Millard, MD   COLONOSCOPY W/ POLYPECTOMY     COLONOSCOPY WITH PROPOFOL N/A 04/09/2015   Procedure: COLONOSCOPY WITH PROPOFOL;  Surgeon: Wallace Cullens, MD;  Location: [facility] ENDOSCOPY;  Service: Gastroenterology;  Laterality: N/A;   CORONARY ANGIOPLASTY     CORONARY ARTERY BYPASS GRAFT N/A 04/13/2002   Procedure: 4v CABG (LIMA-LAD, SVG-OM1, SVG-D1, SVG-PDA); Location: [facility];  Surgeon: Rana Snare, MD   CYSTOSCOPY N/A 07/17/2021   Procedure: Bluford Kaufmann;  Surgeon: [name], MD;  Location: [facility] ORS;  Service: Urology;  Laterality: N/A;   CYSTOSCOPY N/A 06/24/2022   Procedure: DIAGNOSTIC CYSTOSCOPY;  Surgeon: [name], MD;  Location: [facility] ORS;  Service: Urology;  Laterality: N/A;   CYSTOSCOPY WITH BIOPSY N/A 07/17/2021   Procedure: CYSTOSCOPY WITH BIOPSY;  Surgeon: [name], MD;  Location: [facility] ORS;  Service: Urology;  Laterality: N/A;    CYSTOSCOPY WITH BIOPSY N/A 06/24/2022   Procedure: CYSTOSCOPY WITH BLADDER BIOPSY;  Surgeon: [name], MD;  Location: [facility] ORS;  Service: Urology;  Laterality: N/A;   CYSTOSCOPY WITH FULGERATION N/A 07/17/2021   Procedure: CYSTOSCOPY WITH FULGERATION;  Surgeon: [name], MD;  Location: [facility] ORS;  Service: Urology;  Laterality: N/A;   CYSTOSCOPY WITH FULGERATION N/A 06/24/2022   Procedure: CYSTOSCOPY WITH FULGERATION;  Surgeon: [name], MD;  Location: [facility] ORS;  Service: Urology;  Laterality: N/A;   EYE SURGERY     both eyes with cataract with lens 2021   IR BONE MARROW BIOPSY & ASPIRATION  12/17/2022   TONSILLECTOMY     TRANSURETHRAL RESECTION OF BLADDER TUMOR N/A 11/13/2020   Procedure: TRANSURETHRAL RESECTION OF BLADDER TUMOR (TURBT);  Surgeon: [name], MD;  Location: [facility] ORS;  Service: Urology;  Laterality: N/A;   TRANSURETHRAL RESECTION OF BLADDER TUMOR N/A 06/24/2022   Procedure: TRANSURETHRAL RESECTION OF BLADDER TUMOR (TURBT);  Surgeon: [name], MD;  Location: [facility] ORS;  Service: Urology;  Laterality: N/A;   TRANSURETHRAL RESECTION OF BLADDER TUMOR WITH GYRUS (TURBT-GYRUS)  06/24/2022   TRANSURETHRAL RESECTION OF PROSTATE      Home Medications:  No current facility-administered medications on file prior to encounter.   Current Outpatient Medications on File Prior to Encounter  Medication Sig Dispense Refill   acetaminophen (TYLENOL) 500 MG tablet Take 500 mg by mouth every 6 (six) hours as needed.     aspirin 81 MG tablet Take 81 mg by mouth daily.     atorvastatin (LIPITOR) 10 MG tablet Take 10 mg by mouth daily.     cetirizine (ZYRTEC) 10 MG tablet Take 10 mg by mouth at bedtime.     Coenzyme Q10 100 MG capsule Take 100 mg by mouth daily.      ferrous sulfate 324 MG TBEC Take 324 mg by mouth.     fluticasone (FLONASE) 50 MCG/ACT nasal spray Place 1 spray into both nostrils as needed.     GLUCOSAMINE-CHONDROITIN PO Take 1 tablet by mouth 2  (two) times daily.     HYDROcodone-acetaminophen (NORCO/VICODIN) 5-325 MG tablet Take 1 tablet by mouth every 6 (six) hours as needed for moderate pain. 5 tablet 0   isosorbide mononitrate (IMDUR) 30 MG 24 hr tablet Take 30 mg by mouth daily.     Magnesium 200 MG TABS Take 200 mg by mouth 2 (two) times daily.     metoprolol tartrate (LOPRESSOR) 25 MG tablet Take 25 mg by mouth 2 (two) times daily.     metroNIDAZOLE (METROGEL) 0.75 % gel Apply 1 application topically 2 (two) times daily.     Multiple Vitamin (MULTIVITAMIN) tablet Take 1 tablet by mouth daily.     nitroGLYCERIN (NITROSTAT) 0.4 MG SL tablet Place under the tongue.     Omega-3 Fatty Acids (FISH OIL) 1000 MG CAPS Take 1,000 mg by mouth daily.     Potassium Gluconate 595 MG CAPS Take 1 capsule  by mouth daily as needed (After HT TR exercise).     Probiotic Product (MEGA PROBIOTIC) CAPS Take 1 capsule by mouth daily.     silodosin (RAPAFLO) 8 MG CAPS capsule Take 1 capsule (8 mg total) by mouth daily with breakfast. 30 capsule 11   Vitamin A 2400 MCG (8000 UT) CAPS Take 8,000 Units by mouth daily.     vitamin B-12 (CYANOCOBALAMIN) 1000 MCG tablet Take 1,000 mcg by mouth daily.     vitamin C (ASCORBIC ACID) 250 MG tablet Take 250 mg by mouth 2 (two) times daily.     zinc gluconate 50 MG tablet Take 50 mg by mouth daily.      Allergies:  Allergies  Allergen Reactions   Cidex [Glutaral] Anaphylaxis   Other Anaphylaxis    - Ortho-phthalaldehyde  - trospion cause AMS    Family History  Problem Relation Age of Onset   Prostate cancer Father        Metastatic   Cancer Father     Social History:  reports that he quit smoking about 16 years ago. His smoking use included cigarettes. He started smoking about 76 years ago. He has a 45 pack-year smoking history. He has been exposed to tobacco smoke. He has never used smokeless tobacco. He reports current alcohol use of about 2.0 standard drinks of alcohol per week. He reports that he  does not use drugs.  ROS: A complete review of systems was performed.  All systems are negative except for pertinent findings as noted.  Physical Exam:  Vital signs in last 24 hours: Temp:  [97.6 F (36.4 C)-99.8 F (37.7 C)] 98.2 F (36.8 C) (10/31 0754) Pulse Rate:  [90-109] 94 (10/31 0754) Resp:  [9-20] 16 (10/31 0754) BP: (94-138)/(50-87) 98/60 (10/31 0754) SpO2:  [95 %-98 %] 98 % (10/31 0754) Constitutional:  Alert and oriented, No acute distress HEENT: [place] AT, moist mucus membranes.  Trachea midline, no masses Cardiovascular: No clubbing, cyanosis, or edema. Respiratory: Normal respiratory effort, lungs clear bilaterally GI: Abdomen is soft, nontender, nondistended, no abdominal masses GU: No CVA tenderness.  Foley in place draining yellow, cloudy urine Neurologic: Grossly intact, no focal deficits, moving all 4 extremities Psychiatric: Normal mood and affect   Laboratory Data:  Recent Labs    12/16/22 1513 12/17/22 0436 12/18/22 0819  WBC 12.7* 23.9* 10.7*  HGB 9.3* 7.4* 7.4*  HCT 28.1* 21.3* 21.2*   Recent Labs    12/16/22 1227 12/16/22 1513 12/17/22 0436  NA 124* 121* 125*  K 6.0* 5.7* 4.4  CL 100 99 101  CO2 11* 12* 13*  GLUCOSE 124* 136* 143*  BUN 144* 133* 132*  CREATININE 9.00* 9.17* 8.43*  CALCIUM 9.1 8.7* 8.0*   No results for input(s): "LABPT", "INR" in the last 72 hours. No results for input(s): "LABURIN" in the last 72 hours. Results for orders placed or performed in visit on 10/01/22  Microscopic Examination     Status: Abnormal   Collection Time: 10/01/22  2:14 PM   Urine  Result Value Ref Range Status   WBC, UA >30 (A) 0 - 5 /hpf Final   RBC, Urine 11-30 (A) 0 - 2 /hpf Final   Epithelial Cells (non renal) 0-10 0 - 10 /hpf Final   Bacteria, UA Moderate (A) None seen/Few Final  CULTURE, URINE COMPREHENSIVE     Status: Abnormal   Collection Time: 10/01/22  3:33 PM   Specimen: Urine   UR  Result Value Ref Range Status  Urine Culture,  Comprehensive Final report (A)  Final   Organism ID, Bacteria Escherichia coli (A)  Final    Comment: Cefazolin <=4 ug/mL Cefazolin with an MIC <=16 predicts susceptibility to the oral agents cefaclor, cefdinir, cefpodoxime, cefprozil, cefuroxime, cephalexin, and loracarbef when used for therapy of uncomplicated urinary tract infections due to E. coli, Klebsiella pneumoniae, and Proteus mirabilis. 50,000-100,000 colony forming units per mL    ANTIMICROBIAL SUSCEPTIBILITY Comment  Final    Comment:       ** S = Susceptible; I = Intermediate; R = Resistant **                    P = Positive; N = Negative             MICS are expressed in micrograms per mL    Antibiotic                 RSLT#1    RSLT#2    RSLT#3    RSLT#4 Amoxicillin/Clavulanic Acid    S Ampicillin                     S Cefepime                       S Ceftriaxone                    S Cefuroxime                     S Ciprofloxacin                  S Ertapenem                      S Gentamicin                     R Imipenem                       S Levofloxacin                   I Meropenem                      S Nitrofurantoin                 S Piperacillin/Tazobactam        S Tetracycline                   R Tobramycin                     I Trimethoprim/Sulfa             R      Radiologic Imaging: NM PET Image Initial (PI) Whole Body (F-18 FDG)  Result Date: 12/17/2022 CLINICAL DATA:  Initial treatment strategy for multiple myeloma. Prior history of bladder carcinoma and prostate carcinoma. EXAM: NUCLEAR MEDICINE PET WHOLE BODY TECHNIQUE: 7.8 mCi F-18 FDG was injected intravenously. Full-ring PET imaging was performed from the head to foot after the radiotracer. CT data was obtained and used for attenuation correction and anatomic localization. Fasting blood glucose: 97 mg/dl COMPARISON:  None Available. FINDINGS: Mediastinal blood-pool activity (background): SUV max = 2.7 Liver activity (reference): SUV max = N/A  NECK:  No hypermetabolic lymph nodes or masses. Incidental CT findings:  None. CHEST:  No hypermetabolic lymph nodes. No suspicious pulmonary nodules seen on CT images. Incidental CT findings:  None. ABDOMEN/PELVIS: No abnormal hypermetabolic activity within the liver, pancreas, adrenal glands, or spleen. No hypermetabolic lymph nodes in the abdomen or pelvis. Incidental CT findings: Mildly enlarged prostate gland. Foley catheter seen within bladder. Diffuse bladder wall thickening is seen, likely due to chronic bladder outlet obstruction or cystitis. Mild moderate bilateral hydroureteronephrosis is seen to the level of the bladder, likely due to vesicoureteral reflux. SKELETON: No focal lytic or sclerotic bone lesions are seen on CT images. No focal hypermetabolic bone lesions. Incidental CT findings:  None. EXTREMITIES: No focal hypermetabolic bone lesions identified. No focal lytic or sclerotic bone lesion seen on CT images. Incidental CT findings: none IMPRESSION: No focal hypermetabolic or lytic bone lesions identified. Electronically Signed   By: Danae Orleans M.D.   On: 12/17/2022 15:39   IR BONE MARROW BIOPSY & ASPIRATION  Result Date: 12/17/2022 INDICATION: Multiple myeloma workup EXAM: Bone marrow aspiration and core biopsy using fluoroscopic guidance MEDICATIONS: None. ANESTHESIA/SEDATION: Moderate (conscious) sedation was employed during this procedure. A total of Versed 0.5 mg and Fentanyl 25 mcg was administered intravenously. Moderate Sedation Time: 10 minutes. The patient's level of consciousness and vital signs were monitored continuously by radiology nursing throughout the procedure under my direct supervision. FLUOROSCOPY TIME:  Fluoroscopy Time: 0.8 minutes (3 mGy) COMPLICATIONS: None immediate. PROCEDURE: Informed written consent was obtained from the patient after a thorough discussion of the procedural risks, benefits and alternatives. All questions were addressed. Maximal Sterile Barrier  Technique was utilized including caps, mask, sterile gowns, sterile gloves, sterile drape, hand hygiene and skin antiseptic. A timeout was performed prior to the initiation of the procedure. The patient was placed prone on the exam table. Limited fluoroscopy of the pelvis was performed for planning purposes. Skin entry site was marked, and the overlying skin was prepped and draped in the standard sterile fashion. Local analgesia was obtained with 1% lidocaine. Using fluoroscopic guidance, an 11 gauge needle was advanced just deep to the cortex of the right posterior ilium. Subsequently, bone marrow aspiration and core biopsy were performed. Specimens were submitted to lab/pathology for handling. Hemostasis was achieved with manual pressure, and a clean dressing was placed. The patient tolerated the procedure well without immediate complication. IMPRESSION: Successful bone marrow aspiration and core biopsy of the right posterior ilium using fluoroscopic guidance. Electronically Signed   By: Olive Bass M.D.   On: 12/17/2022 11:54   US Renal  Result Date: 12/16/2022 CLINICAL DATA:  Acute on chronic renal failure. EXAM: RENAL / URINARY TRACT ULTRASOUND COMPLETE COMPARISON:  April 17, 2022 FINDINGS: Right Kidney: Renal measurements: 9.7 cm x 5.8 cm x 5.0 cm = volume: 146 mL. There is diffusely increased echogenicity of the renal parenchyma. A 2.6 cm x 2.5 cm x 3.0 cm and 2.5 cm x 1.9 cm x 2.1 cm cysts are seen within the right kidney. There is mild right-sided hydronephrosis. Left Kidney: Renal measurements: 10.6 cm x 6.2 cm x 6.0 cm = volume: 207 mL. There is diffusely increased echogenicity of the renal parenchyma. 1.7 cm x 1.6 cm x 2.0 cm and 4.3 cm x 3.7 cm x 5.4 cm simple cysts are seen within the left kidney. There is moderate severity left-sided hydronephrosis. Bladder: A Foley catheter is present. Other: None. IMPRESSION: 1. Bilateral echogenic kidneys, likely secondary to medical renal disease. 2.  Multiple large, bilateral simple renal cysts. 3. Bilateral hydronephrosis, left greater than right. Electronically  Signed   By: Aram Candela M.D.   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.  He was experiencing gross hematuria with passage of clots when he was first admitted, but this has since cleared to the night and early a.m.  He is now draining yellow cloudy urine.  Bilateral hydronephrosis was noted on his admission renal ultrasound, but was also present on an ultrasound performed in February so this is not a new finding.  The bilateral hydronephrosis may be the result of ureteral stricture by radiation or possibly vesicular reflux.  I tried to contact his wife for further information, but no one was available.  His creatinine has come down this am to 6.68 from 8.43 yesterday.   Plan:  -no plans for nephrostomy tube placement at this time -continue to trend serum creatinine, suspect it will improve with hydration and bladder compression with Foley  -Will plan for an outpatient voiding trial with Dr. Lonna Cobb in 1 to 2 weeks  12/18/2022, 9:11 AM  Harvis Mabus, PA-C

## 2022-12-19 ENCOUNTER — Encounter: Payer: Self-pay | Admitting: Internal Medicine

## 2022-12-19 ENCOUNTER — Encounter: Payer: Medicare Other | Admitting: Physician Assistant

## 2022-12-19 DIAGNOSIS — Z8546 Personal history of malignant neoplasm of prostate: Secondary | ICD-10-CM

## 2022-12-19 DIAGNOSIS — R310 Gross hematuria: Secondary | ICD-10-CM

## 2022-12-19 DIAGNOSIS — E875 Hyperkalemia: Secondary | ICD-10-CM | POA: Diagnosis not present

## 2022-12-19 DIAGNOSIS — N1330 Unspecified hydronephrosis: Secondary | ICD-10-CM

## 2022-12-19 DIAGNOSIS — N179 Acute kidney failure, unspecified: Secondary | ICD-10-CM | POA: Diagnosis not present

## 2022-12-19 DIAGNOSIS — E870 Hyperosmolality and hypernatremia: Secondary | ICD-10-CM | POA: Diagnosis not present

## 2022-12-19 DIAGNOSIS — Z8551 Personal history of malignant neoplasm of bladder: Secondary | ICD-10-CM

## 2022-12-19 LAB — BASIC METABOLIC PANEL
Anion gap: 10 (ref 5–15)
BUN: 103 mg/dL — ABNORMAL HIGH (ref 8–23)
CO2: 23 mmol/L (ref 22–32)
Calcium: 7.9 mg/dL — ABNORMAL LOW (ref 8.9–10.3)
Chloride: 97 mmol/L — ABNORMAL LOW (ref 98–111)
Creatinine, Ser: 6.14 mg/dL — ABNORMAL HIGH (ref 0.61–1.24)
GFR, Estimated: 9 mL/min — ABNORMAL LOW (ref >60)
Glucose, Bld: 123 mg/dL — ABNORMAL HIGH (ref 70–99)
Potassium: 4.2 mmol/L (ref 3.5–5.1)
Sodium: 130 mmol/L — ABNORMAL LOW (ref 135–145)

## 2022-12-19 LAB — CBC WITH DIFFERENTIAL/PLATELET
Abs Immature Granulocytes: 0.05 10*3/uL (ref 0.00–0.07)
Basophils Absolute: 0 10*3/uL (ref 0.0–0.1)
Basophils Relative: 0 %
Eosinophils Absolute: 0.3 10*3/uL (ref 0.0–0.5)
Eosinophils Relative: 3 %
HCT: 20.9 % — ABNORMAL LOW (ref 39.0–52.0)
Hemoglobin: 7 g/dL — ABNORMAL LOW (ref 13.0–17.0)
Immature Granulocytes: 1 %
Lymphocytes Relative: 7 %
Lymphs Abs: 0.7 10*3/uL (ref 0.7–4.0)
MCH: 32 pg (ref 26.0–34.0)
MCHC: 33.5 g/dL (ref 30.0–36.0)
MCV: 95.4 fL (ref 80.0–100.0)
Monocytes Absolute: 1.1 10*3/uL — ABNORMAL HIGH (ref 0.1–1.0)
Monocytes Relative: 11 %
Neutro Abs: 7.7 10*3/uL (ref 1.7–7.7)
Neutrophils Relative %: 78 %
Platelets: 225 10*3/uL (ref 150–400)
RBC: 2.19 MIL/uL — ABNORMAL LOW (ref 4.22–5.81)
RDW: 13.7 % (ref 11.5–15.5)
WBC: 9.8 10*3/uL (ref 4.0–10.5)
nRBC: 0 % (ref 0.0–0.2)

## 2022-12-19 LAB — MULTIPLE MYELOMA PANEL, SERUM
Albumin SerPl Elph-Mcnc: 3.5 g/dL (ref 2.9–4.4)
Albumin/Glob SerPl: 0.8 (ref 0.7–1.7)
Alpha 1: 0.4 g/dL (ref 0.0–0.4)
Alpha2 Glob SerPl Elph-Mcnc: 1.1 g/dL — ABNORMAL HIGH (ref 0.4–1.0)
B-Globulin SerPl Elph-Mcnc: 0.7 g/dL (ref 0.7–1.3)
Gamma Glob SerPl Elph-Mcnc: 2.4 g/dL — ABNORMAL HIGH (ref 0.4–1.8)
Globulin, Total: 4.6 g/dL — ABNORMAL HIGH (ref 2.2–3.9)
IgA: 81 mg/dL (ref 61–437)
IgG (Immunoglobin G), Serum: 2918 mg/dL — ABNORMAL HIGH (ref 603–1613)
IgM (Immunoglobulin M), Srm: 54 mg/dL (ref 15–143)
M Protein SerPl Elph-Mcnc: 1.8 g/dL — ABNORMAL HIGH
Total Protein ELP: 8.1 g/dL (ref 6.0–8.5)

## 2022-12-19 LAB — HEPATITIS B SURFACE ANTIBODY, QUANTITATIVE: Hep B S AB Quant (Post): <3.5 m[IU]/mL — ABNORMAL LOW

## 2022-12-19 LAB — SURGICAL PATHOLOGY

## 2022-12-19 LAB — BETA 2 MICROGLOBULIN, SERUM: Beta-2 Microglobulin: 19.3 mg/L — ABNORMAL HIGH (ref 0.6–2.4)

## 2022-12-19 NOTE — Progress Notes (Signed)
PROGRESS NOTE    [name]  QMV:[id number] DOB: 1945/11/06 DOA: 12/16/2022 PCP: [name], MD    Brief Narrative:   77 y.o. male with medical history significant of hypertension, hyperlipidemia, coronary artery disease status post CABG, history of bladder cancer and prostate cancer s/p radiation who follows up with urology closely.  Patient presents to the emergency room today after he was called to come to the emergency room by hematology [name] on account of having worsening renal function and hyperkalemia.  According to patient in August he followed up with his primary care physician and he was found to have worsening anemia and therefore referred to hematology for further management.  Patient saw hematologist today [name] and underwent some blood work and was later called to come to the emergency room on account of the abnormal labs.    Assessment & Plan:   Principal Problem:   AKI (acute kidney injury) (HCC)  Acute renal failure likely in the setting of possible multiple myeloma Patient presented with creatinine of 9 Baseline creatinine of 1.5-2 in the last 4 to 5 months  Elevated free kappa and lambda obtained in September 2024 Given elevated protein gap as well as rapidly worsening renal function in the setting of anemia, this raises high suspicion for multiple myeloma Nephrology on board Status post bone marrow biopsy 10/30 Status post inpatient PET scan 10/30 Plan: Kidney function is improving.  Creatinine downtrending.  Patient had some clots that were evacuated by bedside RN on 10/30.  Urine now clearing up, now pale yellow.  Case discussed at length with nephrology, oncology, urology.  Consultations appreciated.  No indication for urgent dialysis at this time.  If creatinine plateaus urology to consider nephrostomy tubes.  Continue trending daily renal function.  Oncology to follow-up results of PET scan and bone marrow biopsy.   Normocytic anemia likely 2  secondary to anemia of chronic disease No indication for blood transfusion at this time Monitor CBC closely Transfuse as needed hemoglobin less than 7   Hyperkalemia in the setting of worsening renal failure Patient received hyperkalemia cocktail in the emergency room Potassium has improved Trend daily renal function   Mild leukocytosis Downtrending No clear evidence of infection Continue to monitor closely Antibiotic not indicated at this time Low threshold to start Monitor vitals and fever curve   Severe metabolic acidosis secondary to acute renal failure Renal tubular acidosis type IV Continue IV bicarbonate Monitor BMP closely   Hyponatremia Suspect pseudohyponatremia in the setting of plasma cell dyscrasia As evidenced by hyperosmolarity Plan: Continue IV bicarbonate Avoid saline infusion  Hyperlipidemia PTA statin   Hypertension Hold BP meds at this time (home medications include Imdur metoprolol)   History of bladder cancer History of prostate cancer Outpatient follow-up with urology   Coronary artery disease status post CABG Continue statin therapy and aspirin    DVT prophylaxis: SCDs Code Status: Full Family Communication: Spouse at bedside 10/30, 10/31, 11/1 Disposition Plan: Status is: Inpatient Remains inpatient appropriate because: Severe acute renal failure in the setting of suspected plasma cell dyscrasia   Level of care: Telemetry Medical  Consultants:  Nephrology Hematology oncology  Procedures:  Pulmonary biopsy 10/30 CT PET scan 10/30  Antimicrobials: None   Subjective: Seen and examined.  Awake and alert this morning.  Answer questions appropriately.  Wife at bedside.  Objective: Vitals:   12/18/22 1542 12/18/22 1958 12/19/22 0235 12/19/22 0811  BP: (!) 92/56 (!) 102/48 (!) 107/53 (!) 101/50  Pulse:  95 95 (!) 101 93  Resp: 16 18 18 20   Temp: (!) 97.4 F (36.3 C) 98.3 F (36.8 C) 98.8 F (37.1 C) 97.6 F (36.4 C)   TempSrc:  Oral Oral   SpO2: 98% 100% 99% 98%  Weight:      Height:        Intake/Output Summary (Last 24 hours) at 12/19/2022 1223 Last data filed at 12/19/2022 1126 Gross per 24 hour  Intake 2418.55 ml  Output 2900 ml  Net -481.45 ml   Filed Weights   12/16/22 1900 12/17/22 0826  Weight: 63.8 kg 63.8 kg    Examination:  General exam: No acute distress Respiratory system: Lungs clear.  Normal work of breathing.  Room air Cardiovascular system: S1-S2, RRR, no murmurs, no pedal edema Gastrointestinal system: Soft, NT/ND, normal bowel sounds Central nervous system: Awake.  Oriented x 3.  No focal deficits Extremities: Symmetric 5 x 5 power. Skin: No rashes, lesions or ulcers Psychiatry: Judgement and insight appear normal. Mood & affect appropriate.     Data Reviewed: I have personally reviewed following labs and imaging studies  CBC: Recent Labs  Lab 12/16/22 1227 12/16/22 1513 12/17/22 0436 12/18/22 0819 12/19/22 0525  WBC 14.2* 12.7* 23.9* 10.7* 9.8  NEUTROABS 12.0* 11.0* 22.4* 9.1* 7.7  HGB 9.0* 9.3* 7.4* 7.4* 7.0*  HCT 27.6* 28.1* 21.3* 21.2* 20.9*  MCV 98.6 96.9 94.7 92.2 95.4  PLT 332 327 258 253 225   Basic Metabolic Panel: Recent Labs  Lab 12/16/22 1227 12/16/22 1513 12/17/22 0436 12/18/22 0819 12/19/22 0525  NA 124* 121* 125* 129* 130*  K 6.0* 5.7* 4.4 4.0 4.2  CL 100 99 101 100 97*  CO2 11* 12* 13* 18* 23  GLUCOSE 124* 136* 143* 119* 123*  BUN 144* 133* 132* 108* 103*  CREATININE 9.00* 9.17* 8.43* 6.68* 6.14*  CALCIUM 9.1 8.7* 8.0* 8.0* 7.9*  MG  --  3.2*  --   --   --    GFR: Estimated Creatinine Clearance: 8.9 mL/min (A) (by C-G formula based on SCr of 6.14 mg/dL (H)). Liver Function Tests: Recent Labs  Lab 12/16/22 1227 12/16/22 1513  AST 8* 9*  ALT 13 12  ALKPHOS 67 70  BILITOT 0.5 0.7  PROT 8.9* 8.7*  ALBUMIN 3.5 3.2*   No results for input(s): "LIPASE", "AMYLASE" in the last 168 hours. No results for input(s): "AMMONIA" in  the last 168 hours. Coagulation Profile: No results for input(s): "INR", "PROTIME" in the last 168 hours. Cardiac Enzymes: No results for input(s): "CKTOTAL", "CKMB", "CKMBINDEX", "TROPONINI" in the last 168 hours. BNP (last 3 results) No results for input(s): "PROBNP" in the last 8760 hours. HbA1C: No results for input(s): "HGBA1C" in the last 72 hours. CBG: Recent Labs  Lab 12/17/22 1147  GLUCAP 97   Lipid Profile: No results for input(s): "CHOL", "HDL", "LDLCALC", "TRIG", "CHOLHDL", "LDLDIRECT" in the last 72 hours. Thyroid Function Tests: Recent Labs    12/16/22 1227  TSH 5.667*   Anemia Panel: Recent Labs    12/16/22 1227  VITAMINB12 2,084*  FOLATE >40.0  FERRITIN 454*  TIBC 207*  IRON 88  RETICCTPCT 2.8   Sepsis Labs: Recent Labs  Lab 12/17/22 0436  PROCALCITON 3.60    No results found for this or any previous visit (from the past 240 hour(s)).       Radiology Studies: NM PET Image Initial (PI) Whole Body (F-18 FDG)  Result Date: 12/17/2022 CLINICAL DATA:  Initial treatment strategy for multiple  myeloma. Prior history of bladder carcinoma and prostate carcinoma. EXAM: NUCLEAR MEDICINE PET WHOLE BODY TECHNIQUE: 7.8 mCi F-18 FDG was injected intravenously. Full-ring PET imaging was performed from the head to foot after the radiotracer. CT data was obtained and used for attenuation correction and anatomic localization. Fasting blood glucose: 97 mg/dl COMPARISON:  None Available. FINDINGS: Mediastinal blood-pool activity (background): SUV max = 2.7 Liver activity (reference): SUV max = N/A NECK:  No hypermetabolic lymph nodes or masses. Incidental CT findings:  None. CHEST: No hypermetabolic lymph nodes. No suspicious pulmonary nodules seen on CT images. Incidental CT findings:  None. ABDOMEN/PELVIS: No abnormal hypermetabolic activity within the liver, pancreas, adrenal glands, or spleen. No hypermetabolic lymph nodes in the abdomen or pelvis. Incidental CT  findings: Mildly enlarged prostate gland. Foley catheter seen within bladder. Diffuse bladder wall thickening is seen, likely due to chronic bladder outlet obstruction or cystitis. Mild moderate bilateral hydroureteronephrosis is seen to the level of the bladder, likely due to vesicoureteral reflux. SKELETON: No focal lytic or sclerotic bone lesions are seen on CT images. No focal hypermetabolic bone lesions. Incidental CT findings:  None. EXTREMITIES: No focal hypermetabolic bone lesions identified. No focal lytic or sclerotic bone lesion seen on CT images. Incidental CT findings: none IMPRESSION: No focal hypermetabolic or lytic bone lesions identified. Electronically Signed   By: Danae Orleans M.D.   On: 12/17/2022 15:39        Scheduled Meds:  atorvastatin  10 mg Oral Daily   Chlorhexidine Gluconate Cloth  6 each Topical Daily   feeding supplement  237 mL Oral BID BM   ferrous sulfate  324 mg Oral Q breakfast   loratadine  10 mg Oral Daily   multivitamin with minerals  1 tablet Oral Daily   Continuous Infusions:  sodium bicarbonate 150 mEq in dextrose 5 % 1,150 mL infusion 75 mL/hr at 12/19/22 1126     LOS: 3 days     Tresa Moore, MD [facility]   If 7PM-7AM, please contact night-coverage  12/19/2022, 12:23 PM

## 2022-12-19 NOTE — Progress Notes (Signed)
[facility]  ROUNDING NOTE   Subjective:   Pateint was last seen in nephrology office on 9/12 for work up for worsening renal function and nephrotic range proteinuria and hematuria.  Patient seen sitting up in bed Alert and oriented, repetition of demands Requesting assistance ambulating in room and sitting in chair Wife at bedside Denies pain Poor oral intake, endorses 1-2 Ensures a day  Objective:  Vital signs in last 24 hours:  Temp:  [97.4 F (36.3 C)-98.8 F (37.1 C)] 97.6 F (36.4 C) (11/01 0811) Pulse Rate:  [93-101] 93 (11/01 0811) Resp:  [16-20] 20 (11/01 0811) BP: (92-107)/(48-56) 101/50 (11/01 0811) SpO2:  [98 %-100 %] 98 % (11/01 0811)  Weight change:  Filed Weights   12/16/22 1900 12/17/22 0826  Weight: 63.8 kg 63.8 kg    Intake/Output: I/O last 3 completed shifts: In: 1943.5 [I.V.:1943.5] Out: 3650 [Urine:3650]   Intake/Output this shift:  Total I/O In: 475.1 [I.V.:475.1] Out: 800 [Urine:800]  Physical Exam: General: NAD  Head: Normocephalic, atraumatic. Moist oral mucosal membranes  Eyes: Anicteric  Lungs:  Clear to auscultation, normal effort  Heart: Regular rate and rhythm  Abdomen:  Soft, nontender  Extremities:  no peripheral edema.  Neurologic: Alert and oriented  Skin: No lesions  Access: none    Basic Metabolic Panel: Recent Labs  Lab 12/16/22 1227 12/16/22 1513 12/17/22 0436 12/18/22 0819 12/19/22 0525  NA 124* 121* 125* 129* 130*  K 6.0* 5.7* 4.4 4.0 4.2  CL 100 99 101 100 97*  CO2 11* 12* 13* 18* 23  GLUCOSE 124* 136* 143* 119* 123*  BUN 144* 133* 132* 108* 103*  CREATININE 9.00* 9.17* 8.43* 6.68* 6.14*  CALCIUM 9.1 8.7* 8.0* 8.0* 7.9*  MG  --  3.2*  --   --   --     Liver Function Tests: Recent Labs  Lab 12/16/22 1227 12/16/22 1513  AST 8* 9*  ALT 13 12  ALKPHOS 67 70  BILITOT 0.5 0.7  PROT 8.9* 8.7*  ALBUMIN 3.5 3.2*   No results for input(s): "LIPASE", "AMYLASE" in the last 168 hours. No  results for input(s): "AMMONIA" in the last 168 hours.  CBC: Recent Labs  Lab 12/16/22 1227 12/16/22 1513 12/17/22 0436 12/18/22 0819 12/19/22 0525  WBC 14.2* 12.7* 23.9* 10.7* 9.8  NEUTROABS 12.0* 11.0* 22.4* 9.1* 7.7  HGB 9.0* 9.3* 7.4* 7.4* 7.0*  HCT 27.6* 28.1* 21.3* 21.2* 20.9*  MCV 98.6 96.9 94.7 92.2 95.4  PLT 332 327 258 253 225    Cardiac Enzymes: No results for input(s): "CKTOTAL", "CKMB", "CKMBINDEX", "TROPONINI" in the last 168 hours.  BNP: Invalid input(s): "POCBNP"  CBG: Recent Labs  Lab 12/17/22 1147  GLUCAP 97    Microbiology: Results for orders placed or performed in visit on 10/01/22  Microscopic Examination     Status: Abnormal   Collection Time: 10/01/22  2:14 PM   Urine  Result Value Ref Range Status   WBC, UA >30 (A) 0 - 5 /hpf Final   RBC, Urine 11-30 (A) 0 - 2 /hpf Final   Epithelial Cells (non renal) 0-10 0 - 10 /hpf Final   Bacteria, UA Moderate (A) None seen/Few Final  CULTURE, URINE COMPREHENSIVE     Status: Abnormal   Collection Time: 10/01/22  3:33 PM   Specimen: Urine   UR  Result Value Ref Range Status   Urine Culture, Comprehensive Final report (A)  Final   Organism ID, Bacteria Escherichia coli (A)  Final  Comment: Cefazolin <=4 ug/mL Cefazolin with an MIC <=16 predicts susceptibility to the oral agents cefaclor, cefdinir, cefpodoxime, cefprozil, cefuroxime, cephalexin, and loracarbef when used for therapy of uncomplicated urinary tract infections due to E. coli, Klebsiella pneumoniae, and Proteus mirabilis. 50,000-100,000 colony forming units per mL    ANTIMICROBIAL SUSCEPTIBILITY Comment  Final    Comment:       ** S = Susceptible; I = Intermediate; R = Resistant **                    P = Positive; N = Negative             MICS are expressed in micrograms per mL    Antibiotic                 RSLT#1    RSLT#2    RSLT#3    RSLT#4 Amoxicillin/Clavulanic Acid    S Ampicillin                     S Cefepime                        S Ceftriaxone                    S Cefuroxime                     S Ciprofloxacin                  S Ertapenem                      S Gentamicin                     R Imipenem                       S Levofloxacin                   I Meropenem                      S Nitrofurantoin                 S Piperacillin/Tazobactam        S Tetracycline                   R Tobramycin                     I Trimethoprim/Sulfa             R     Coagulation Studies: No results for input(s): "LABPROT", "INR" in the last 72 hours.  Urinalysis: Recent Labs    12/16/22 1513  COLORURINE RED*  LABSPEC TEST NOT REPORTED DUE TO COLOR INTERFERENCE OF URINE PIGMENT  PHURINE TEST NOT REPORTED DUE TO COLOR INTERFERENCE OF URINE PIGMENT  GLUCOSEU TEST NOT REPORTED DUE TO COLOR INTERFERENCE OF URINE PIGMENT*  HGBUR TEST NOT REPORTED DUE TO COLOR INTERFERENCE OF URINE PIGMENT*  BILIRUBINUR TEST NOT REPORTED DUE TO COLOR INTERFERENCE OF URINE PIGMENT*  KETONESUR TEST NOT REPORTED DUE TO COLOR INTERFERENCE OF URINE PIGMENT*  PROTEINUR TEST NOT REPORTED DUE TO COLOR INTERFERENCE OF URINE PIGMENT*  NITRITE TEST NOT REPORTED DUE TO COLOR INTERFERENCE OF URINE PIGMENT*  LEUKOCYTESUR TEST NOT REPORTED DUE TO COLOR INTERFERENCE OF URINE PIGMENT*  Imaging: NM PET Image Initial (PI) Whole Body (F-18 FDG)  Result Date: 12/17/2022 CLINICAL DATA:  Initial treatment strategy for multiple myeloma. Prior history of bladder carcinoma and prostate carcinoma. EXAM: NUCLEAR MEDICINE PET WHOLE BODY TECHNIQUE: 7.8 mCi F-18 FDG was injected intravenously. Full-ring PET imaging was performed from the head to foot after the radiotracer. CT data was obtained and used for attenuation correction and anatomic localization. Fasting blood glucose: 97 mg/dl COMPARISON:  None Available. FINDINGS: Mediastinal blood-pool activity (background): SUV max = 2.7 Liver activity (reference): SUV max = N/A NECK:  No hypermetabolic lymph  nodes or masses. Incidental CT findings:  None. CHEST: No hypermetabolic lymph nodes. No suspicious pulmonary nodules seen on CT images. Incidental CT findings:  None. ABDOMEN/PELVIS: No abnormal hypermetabolic activity within the liver, pancreas, adrenal glands, or spleen. No hypermetabolic lymph nodes in the abdomen or pelvis. Incidental CT findings: Mildly enlarged prostate gland. Foley catheter seen within bladder. Diffuse bladder wall thickening is seen, likely due to chronic bladder outlet obstruction or cystitis. Mild moderate bilateral hydroureteronephrosis is seen to the level of the bladder, likely due to vesicoureteral reflux. SKELETON: No focal lytic or sclerotic bone lesions are seen on CT images. No focal hypermetabolic bone lesions. Incidental CT findings:  None. EXTREMITIES: No focal hypermetabolic bone lesions identified. No focal lytic or sclerotic bone lesion seen on CT images. Incidental CT findings: none IMPRESSION: No focal hypermetabolic or lytic bone lesions identified. Electronically Signed   By: Danae Orleans M.D.   On: 12/17/2022 15:39     Medications:    sodium bicarbonate 150 mEq in dextrose 5 % 1,150 mL infusion 75 mL/hr at 12/19/22 1126    atorvastatin  10 mg Oral Daily   Chlorhexidine Gluconate Cloth  6 each Topical Daily   feeding supplement  237 mL Oral BID BM   ferrous sulfate  324 mg Oral Q breakfast   loratadine  10 mg Oral Daily   multivitamin with minerals  1 tablet Oral Daily   fluticasone, melatonin, morphine injection  Assessment/ Plan:  Mr. YANI LAL is a 77 y.o.  male with hypertension, AAA, hyperlipidemia, coronary artery disease status post CABG, bladder cancer, glaucoma who [facility] with Hyperkalemia [E87.5] Hyponatremia [E87.1] AKI (acute kidney injury) (HCC) [N17.9] Anemia, unspecified type [D64.9] Acute renal failure superimposed on chronic kidney disease, unspecified acute renal failure type, unspecified CKD  stage (HCC) [N17.9, N18.9]  Acute Kidney Injury with hyperkalemia on Chronic kidney disease stage IV with proteinuria: concern for underlying multiple myeloma and cast nephropathy - Creatinine showing improvement with improvements also noted BUN. -2.1 L of urine output recorded, clear yellow urine noted in Foley catheter. -Mentation has improved.  Discussed with spouse that moments of confusion and disorientation may remain present at times due to elevated toxin levels. -No acute indication for dialysis at this time.  Will continue to monitor. - Patient encouraged to increase oral intake. -Will allow for aspirin washout and seek renal biopsy on Monday.  Hyponatremia: hyperosmolar consistent with pseudohyponatremia from plasma cell proteins.  - Sodium 130  Acute metabolic acidosis: nonanion gap: consistent with renal tubular acidosis type IV.  - IV sodium bicarbonate infusion.   Anemia with chronic kidney disease: with leukocytosis. Concerning for multiple myeloma or other blood cell dyscrasia. Appreciate hematology following    LOS: 3 Kimberlly Norgard 11/1/202411:52 AM

## 2022-12-19 NOTE — Progress Notes (Signed)
Urology Inpatient Progress Note  Subjective: No acute events overnight.  He is afebrile, VSS. WBC count down today, 9.8.  Hemoglobin down, 7.0.  Creatinine down, 6.14. Foley catheter in place draining clear, yellow urine. No acute concerns this morning.  He is accompanied today by his wife at the bedside.  Anti-infectives: Anti-infectives (From admission, onward)    None       Current Facility-Administered Medications  Medication Dose Route Frequency Provider Last Rate Last Admin   atorvastatin (LIPITOR) tablet 10 mg  10 mg Oral Daily Rosezetta Schlatter T, MD   10 mg at 12/19/22 1610   Chlorhexidine Gluconate Cloth 2 % PADS 6 each  6 each Topical Daily Lolita Patella B, MD   6 each at 12/18/22 0959   feeding supplement (ENSURE ENLIVE / ENSURE PLUS) liquid 237 mL  237 mL Oral BID BM Rosezetta Schlatter T, MD   237 mL at 12/19/22 9604   ferrous sulfate tablet 324 mg  324 mg Oral Q breakfast Lolita Patella B, MD   324 mg at 12/19/22 0837   fluticasone (FLONASE) 50 MCG/ACT nasal spray 1 spray  1 spray Each Nare PRN Lolita Patella B, MD       loratadine (CLARITIN) tablet 10 mg  10 mg Oral Daily Georgeann Oppenheim, Sudheer B, MD   10 mg at 12/19/22 0837   melatonin tablet 2.5 mg  2.5 mg Oral QHS PRN Mansy, Jan A, MD   2.5 mg at 12/18/22 2341   morphine (PF) 2 MG/ML injection 2 mg  2 mg Intravenous Q2H PRN Andris Baumann, MD   2 mg at 12/17/22 0227   multivitamin with minerals tablet 1 tablet  1 tablet Oral Daily Lolita Patella B, MD   1 tablet at 12/19/22 0837   sodium bicarbonate 150 mEq in dextrose 5 % 1,150 mL infusion   Intravenous Continuous Trinna Post, MD 75 mL/hr at 12/19/22 0111 New Bag at 12/19/22 0111   Objective: Vital signs in last 24 hours: Temp:  [97.4 F (36.3 C)-98.8 F (37.1 C)] 97.6 F (36.4 C) (11/01 0811) Pulse Rate:  [93-101] 93 (11/01 0811) Resp:  [16-20] 20 (11/01 0811) BP: (92-107)/(48-56) 101/50 (11/01 0811) SpO2:  [98 %-100 %] 98 % (11/01 0811)  Intake/Output from  previous day: 10/31 0701 - 11/01 0700 In: 1943.5 [I.V.:1943.5] Out: 2100 [Urine:2100] Intake/Output this shift: No intake/output data recorded.  Physical Exam Vitals and nursing note reviewed.  Constitutional:      General: He is not in acute distress.    Appearance: He is not ill-appearing, toxic-appearing or diaphoretic.  HENT:     Head: Normocephalic and atraumatic.  Pulmonary:     Effort: Pulmonary effort is normal. No respiratory distress.  Skin:    General: Skin is warm and dry.  Neurological:     Mental Status: He is alert.  Psychiatric:        Mood and Affect: Mood normal.        Behavior: Behavior normal.     Lab Results:  Recent Labs    12/18/22 0819 12/19/22 0525  WBC 10.7* 9.8  HGB 7.4* 7.0*  HCT 21.2* 20.9*  PLT 253 225   BMET Recent Labs    12/18/22 0819 12/19/22 0525  NA 129* 130*  K 4.0 4.2  CL 100 97*  CO2 18* 23  GLUCOSE 119* 123*  BUN 108* 103*  CREATININE 6.68* 6.14*  CALCIUM 8.0* 7.9*   PT/INR No results for input(s): "LABPROT", "INR" in the last 72  hours. ABG No results for input(s): "PHART", "HCO3" in the last 72 hours.  Invalid input(s): "PCO2", "PO2"  Studies/Results: NM PET Image Initial (PI) Whole Body (F-18 FDG)  Result Date: 12/17/2022 CLINICAL DATA:  Initial treatment strategy for multiple myeloma. Prior history of bladder carcinoma and prostate carcinoma. EXAM: NUCLEAR MEDICINE PET WHOLE BODY TECHNIQUE: 7.8 mCi F-18 FDG was injected intravenously. Full-ring PET imaging was performed from the head to foot after the radiotracer. CT data was obtained and used for attenuation correction and anatomic localization. Fasting blood glucose: 97 mg/dl COMPARISON:  None Available. FINDINGS: Mediastinal blood-pool activity (background): SUV max = 2.7 Liver activity (reference): SUV max = N/A NECK:  No hypermetabolic lymph nodes or masses. Incidental CT findings:  None. CHEST: No hypermetabolic lymph nodes. No suspicious pulmonary nodules  seen on CT images. Incidental CT findings:  None. ABDOMEN/PELVIS: No abnormal hypermetabolic activity within the liver, pancreas, adrenal glands, or spleen. No hypermetabolic lymph nodes in the abdomen or pelvis. Incidental CT findings: Mildly enlarged prostate gland. Foley catheter seen within bladder. Diffuse bladder wall thickening is seen, likely due to chronic bladder outlet obstruction or cystitis. Mild moderate bilateral hydroureteronephrosis is seen to the level of the bladder, likely due to vesicoureteral reflux. SKELETON: No focal lytic or sclerotic bone lesions are seen on CT images. No focal hypermetabolic bone lesions. Incidental CT findings:  None. EXTREMITIES: No focal hypermetabolic bone lesions identified. No focal lytic or sclerotic bone lesion seen on CT images. Incidental CT findings: none IMPRESSION: No focal hypermetabolic or lytic bone lesions identified. Electronically Signed   By: Danae Orleans M.D.   On: 12/17/2022 15:39   IR BONE MARROW BIOPSY & ASPIRATION  Result Date: 12/17/2022 INDICATION: Multiple myeloma workup EXAM: Bone marrow aspiration and core biopsy using fluoroscopic guidance MEDICATIONS: None. ANESTHESIA/SEDATION: Moderate (conscious) sedation was employed during this procedure. A total of Versed 0.5 mg and Fentanyl 25 mcg was administered intravenously. Moderate Sedation Time: 10 minutes. The patient's level of consciousness and vital signs were monitored continuously by radiology nursing throughout the procedure under my direct supervision. FLUOROSCOPY TIME:  Fluoroscopy Time: 0.8 minutes (3 mGy) COMPLICATIONS: None immediate. PROCEDURE: Informed written consent was obtained from the patient after a thorough discussion of the procedural risks, benefits and alternatives. All questions were addressed. Maximal Sterile Barrier Technique was utilized including caps, mask, sterile gowns, sterile gloves, sterile drape, hand hygiene and skin antiseptic. A timeout was performed  prior to the initiation of the procedure. The patient was placed prone on the exam table. Limited fluoroscopy of the pelvis was performed for planning purposes. Skin entry site was marked, and the overlying skin was prepped and draped in the standard sterile fashion. Local analgesia was obtained with 1% lidocaine. Using fluoroscopic guidance, an 11 gauge needle was advanced just deep to the cortex of the right posterior ilium. Subsequently, bone marrow aspiration and core biopsy were performed. Specimens were submitted to lab/pathology for handling. Hemostasis was achieved with manual pressure, and a clean dressing was placed. The patient tolerated the procedure well without immediate complication. IMPRESSION: Successful bone marrow aspiration and core biopsy of the right posterior ilium using fluoroscopic guidance. Electronically Signed   By: Olive Bass M.D.   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  Gross hematuria has resolved and creatinine is downtrending.  Suspect a bit of dilutional changes on his CBC this morning.  No plans for urgent urologic intervention at this time.  Recommendations: -Continue Foley catheter with plans for outpatient voiding trial in 1 to 2 weeks -Continue to trend renal function, consider bilateral nephrostomy tubes if it plateaus -Continue to monitor for gross hematuria recurrence  Carman Ching, PA-C 12/19/2022

## 2022-12-19 NOTE — Progress Notes (Signed)
Mobility Specialist - Progress Note     12/19/22 1104  Mobility  Activity Stood at bedside;Ambulated with assistance in room;Transferred from chair to bed;Dangled on edge of bed  Level of Assistance Contact guard assist, steadying assist  Assistive Device Front wheel walker  Distance Ambulated (ft) 4 ft  Range of Motion/Exercises Active  Activity Response Tolerated well  Mobility Referral Yes  $Mobility charge 1 Mobility  Mobility Specialist Start Time (ACUTE ONLY) 1052  Mobility Specialist Stop Time (ACUTE ONLY) 1107  Mobility Specialist Time Calculation (min) (ACUTE ONLY) 15 min   Pt resting in bed on RA upon entry. Pt performs bed mobility and STS MinA. Pt takes forward pivot and backward steps to recliner CGA with RW. Pt left in recliner with needs in reach. Chair alarm activated.   Johnathan Hausen Mobility Specialist 12/19/22, 11:12 AM

## 2022-12-19 NOTE — Progress Notes (Signed)
Physical Therapy Treatment Patient Details Name: [name] MRN: [id number] DOB: Apr 14, 1945 Today's Date: 12/19/2022   History of Present Illness 77 y.o. male with medical history significant of hypertension, hyperlipidemia, coronary artery disease status post CABG, history of bladder cancer and prostate cancer s/p radiation who follows up with urology closely.    PT Comments  Pt received up in chair, motivated to progress ambulation this visit. Pt stood from recliner with MinA and vc's for hand placement. Gait training with RW in hall with CGA ~146ft, slow cadence, no LOB. Returned to room, reviewed B LE seated strengthening exercises with pt and family. Will continue to progress acutely. Nursing encouraged to ambulate pt in hall to regain prior LOF and strength.    If plan is discharge home, recommend the following: A little help with walking and/or transfers;A little help with bathing/dressing/bathroom;Assistance with feeding;Assist for transportation   Can travel by private vehicle        Equipment Recommendations  Rollator (4 wheels);Rolling walker (2 wheels)    Recommendations for Other Services       Precautions / Restrictions Precautions Precautions: Fall Restrictions Weight Bearing Restrictions: No     Mobility  Bed Mobility               General bed mobility comments: in recliner pre/post session, not tested    Transfers Overall transfer level: Needs assistance Equipment used: Rolling walker (2 wheels) Transfers: Sit to/from Stand Sit to Stand: Min assist, Contact guard assist           General transfer comment: Pt needed extra cuing for set up (U&LEs) and sequencing,very light assist to shift weight forward and insure continued upward momentum    Ambulation/Gait Ambulation/Gait assistance: Contact guard assist Gait Distance (Feet): 120 Feet Assistive device: Rolling walker (2 wheels) Gait Pattern/deviations: Step-through pattern, Decreased step  length - right, Decreased step length - left Gait velocity: decreased     General Gait Details: Pt typically does not use walker but currently relies on UE support for balance. He was able to maintain a slow cadence Good effort, SpO2 remained in the mid/high 90s on room air, HR to the 80s.   Stairs Stairs:  (Pt has stairs to enter home)           Wheelchair Mobility     Tilt Bed    Modified Rankin (Stroke Patients Only)       Balance Overall balance assessment: Needs assistance Sitting-balance support: No upper extremity supported, Feet supported Sitting balance-Leahy Scale: Good     Standing balance support: Bilateral upper extremity supported, During functional activity, Reliant on assistive device for balance Standing balance-Leahy Scale: Fair Standing balance comment: reliant on the walker/UEs but no LOBs                            Cognition Arousal: Alert Behavior During Therapy: WFL for tasks assessed/performed Overall Cognitive Status: Impaired/Different from baseline Area of Impairment: Problem solving, Following commands                       Following Commands: Follows multi-step commands with increased time     Problem Solving: Slow processing, Difficulty sequencing General Comments: off from baseline, delayed but oriented        Exercises      General Comments        Pertinent Vitals/Pain Pain Assessment Pain Assessment: No/denies pain    Home  Living                          Prior Function            PT Goals (current goals can now be found in the care plan section) Acute Rehab PT Goals Patient Stated Goal: eat something Progress towards PT goals: Progressing toward goals    Frequency    Min 1X/week      PT Plan      Co-evaluation              AM-PAC PT "6 Clicks" Mobility   Outcome Measure  Help needed turning from your back to your side while in a flat bed without using  bedrails?: A Little Help needed moving from lying on your back to sitting on the side of a flat bed without using bedrails?: A Little Help needed moving to and from a bed to a chair (including a wheelchair)?: A Little Help needed standing up from a chair using your arms (e.g., wheelchair or bedside chair)?: A Little Help needed to walk in hospital room?: A Lot Help needed climbing 3-5 steps with a railing? : A Lot 6 Click Score: 16    End of Session Equipment Utilized During Treatment: Gait belt Activity Tolerance: Patient tolerated treatment well Patient left: in chair;with call bell/phone within reach;with chair alarm set;with family/visitor present Nurse Communication: Mobility status PT Visit Diagnosis: Muscle weakness (generalized) (M62.81);Difficulty in walking, not elsewhere classified (R26.2)     Time: 5621-3086 PT Time Calculation (min) (ACUTE ONLY): 29 min  Charges:    $Gait Training: 8-22 mins $Therapeutic Exercise: 8-22 mins PT General Charges $$ ACUTE PT VISIT: 1 Visit           Zadie Cleverly, PTA  Jannet Askew 12/19/2022, 2:38 PM

## 2022-12-19 NOTE — Care Management Important Message (Signed)
Important Message  Patient Details  Name: [name] MRN: [id number] Date of Birth: 1945-12-18   Important Message Given:  N/A - LOS <3 / Initial given by admissions     Olegario Messier A Zionah Criswell 12/19/2022, 3:38 PM

## 2022-12-19 NOTE — Plan of Care (Signed)

## 2022-12-19 NOTE — Consult Note (Signed)
Chief Complaint: Patient was seen in consultation today for renal biopsy at the request of Lamont Dowdy, MD   Referring Physician(s): Lamont Dowdy, MD   Supervising Physician: Malachy Moan  Patient Status: [facility] - In-pt  History of Present Illness: [name] is a 77 y.o. male with PMHx of HTN, AAA, CKD, CAD s/p CABG, prostate cancer, bladder cancer who presented to [facility] 10/29 with acute on chronic renal failure and hyperkalemia who is known to our service s/p bone marrow biopsy on 10/30 with concerns for multiple myeloma. IR received request for renal biopsy given worsening renal function and nephrotic range proteinuria and hematuria. The patient is being followed by nephrology and urology. Currently patient's labs are slightly improving and he is urinating clear yellow urine, however renal biopsy still requested. The patient states he is feeling better overall and less confused today. He denies any complications with previous sedation that was given during bone marrow biopsy.  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 4v CABG on 04/13/2002. b.) LHC 02/03/2013 --> LVEF 50%; severe 3v CAD with patent LIMA-LAD and SVG-OM1 grafts; SVG-PDA and SVG-DG1 occluded with collaterals to PDA and DG1 from LAD.   ED (erectile dysfunction)    Family history of macular degeneration 10/05/2018   History of 2019 novel coronavirus disease (COVID-19) 03/29/2020   HLD (hyperlipidemia)    Hypertension    Prostate cancer  (HCC)    Rosacea    S/P CABG x 4 04/13/2002   a.) LIMA-LAD, SVG-OM1, SVG-D1, SVG-PDA   Tubular adenoma of colon 02/03/2007    Past Surgical History:  Procedure Laterality Date   CARDIAC CATHETERIZATION Left 04/13/2002   Procedure: CARDIAC CATHETERIZATION; Location: [facility]; Surgeon; Marcina Millard, MD   CARDIAC CATHETERIZATION Left 02/03/2013   Procedure: CARDIAC CATHETERIZATION; Location: [facility]; Surgeon: Marcina Millard, MD   COLONOSCOPY W/ POLYPECTOMY     COLONOSCOPY WITH PROPOFOL N/A 04/09/2015   Procedure: COLONOSCOPY WITH PROPOFOL;  Surgeon: Wallace Cullens, MD;  Location: [facility] ENDOSCOPY;  Service: Gastroenterology;  Laterality: N/A;   CORONARY ANGIOPLASTY     CORONARY ARTERY BYPASS GRAFT N/A 04/13/2002   Procedure: 4v CABG (LIMA-LAD, SVG-OM1, SVG-D1, SVG-PDA); Location: [facility]; Surgeon: Rana Snare, MD   CYSTOSCOPY N/A 07/17/2021   Procedure: Bluford Kaufmann;  Surgeon: [name], MD;  Location: [facility] ORS;  Service: Urology;  Laterality: N/A;   CYSTOSCOPY N/A 06/24/2022   Procedure: DIAGNOSTIC CYSTOSCOPY;  Surgeon: [name], MD;  Location: [facility] OR[facility];  Service: Urology;  Laterality: N/A;   CYSTOSCOPY WITH BIOPSY N/A 07/17/2021   Procedure: CYSTOSCOPY WITH BIOPSY;  Surgeon: [name], MD;  Location: [facility] OR[facility];  Service: Urology;  Laterality: N/A;   CYSTOSCOPY WITH BIOPSY N/A 06/24/2022   Procedure: CYSTOSCOPY WITH BLADDER BIOPSY;  Surgeon: [name], MD;  Location: [facility] OR[facility];  Service: Urology;  Laterality: N/A;   CYSTOSCOPY WITH FULGERATION N/A 07/17/2021   Procedure:  CYSTOSCOPY WITH FULGERATION;  Surgeon: [name], MD;  Location: [facility];  Service: Urology;  Laterality: N/A;   CYSTOSCOPY WITH FULGERATION N/A 06/24/2022   Procedure: CYSTOSCOPY WITH FULGERATION;  Surgeon: [name], MD;  Location: [facility];  Service: Urology;  Laterality: N/A;   EYE SURGERY     both eyes with cataract with lens 2021   IR BONE MARROW BIOPSY & ASPIRATION  12/17/2022    TONSILLECTOMY     TRANSURETHRAL RESECTION OF BLADDER TUMOR N/A 11/13/2020   Procedure: TRANSURETHRAL RESECTION OF BLADDER TUMOR (TURBT);  Surgeon: [name], MD;  Location: [facility] OR[facility];  Service: Urology;  Laterality: N/A;   TRANSURETHRAL RESECTION OF BLADDER TUMOR N/A 06/24/2022   Procedure: TRANSURETHRAL RESECTION OF BLADDER TUMOR (TURBT);  Surgeon: [name], MD;  Location: [facility] ORS;  Service: Urology;  Laterality: N/A;   TRANSURETHRAL RESECTION OF BLADDER TUMOR WITH GYRUS (TURBT-GYRUS)  06/24/2022   TRANSURETHRAL RESECTION OF PROSTATE      Allergies: Cidex [glutaral] and Other  Medications: Prior to Admission medications   Medication Sig Start Date End Date Taking? Authorizing Provider  acetaminophen (TYLENOL) 500 MG tablet Take 500 mg by mouth every 6 (six) hours as needed.    [provider]  aspirin 81 MG tablet Take 81 mg by mouth daily.    [provider]  atorvastatin (LIPITOR) 10 MG tablet Take 10 mg by mouth daily. 04/29/22   [provider]  cetirizine (ZYRTEC) 10 MG tablet Take 10 mg by mouth at bedtime.    [provider]  Coenzyme Q10 100 MG capsule Take 100 mg by mouth daily.     [provider]  ferrous sulfate 324 MG TBEC Take 324 mg by mouth.    [provider]  fluticasone (FLONASE) 50 MCG/ACT nasal spray Place 1 spray into both nostrils as needed.    [provider]  GLUCOSAMINE-CHONDROITIN PO Take 1 tablet by mouth 2 (two) times daily.    [provider]  HYDROcodone-acetaminophen (NORCO/VICODIN) 5-325 MG tablet Take 1 tablet by mouth every 6 (six) hours as needed for moderate pain. 06/24/22   Stoioff, Verna Czech, MD  isosorbide mononitrate (IMDUR) 30 MG 24 hr tablet Take 30 mg by mouth daily.    [provider]  Magnesium 200 MG TABS Take 200 mg by mouth 2 (two) times daily.    [provider]  metoprolol tartrate (LOPRESSOR) 25 MG tablet Take 25 mg by mouth 2 (two) times  daily.    [provider]  metroNIDAZOLE (METROGEL) 0.75 % gel Apply 1 application topically 2 (two) times daily.    [provider]  Multiple Vitamin (MULTIVITAMIN) tablet Take 1 tablet by mouth daily.    [provider]  nitroGLYCERIN (NITROSTAT) 0.4 MG SL tablet Place under the tongue. 08/21/21   [provider]  Omega-3 Fatty Acids (FISH OIL) 1000 MG CAPS Take 1,000 mg by mouth daily.    [provider]  Potassium Gluconate 595 MG CAPS Take 1 capsule by mouth daily as needed (After HT TR exercise).    [provider]  Probiotic Product (MEGA PROBIOTIC) CAPS Take 1 capsule by mouth daily. 07/20/19   [provider]  silodosin (RAPAFLO) 8 MG CAPS capsule Take 1 capsule (8 mg total) by mouth daily with breakfast. 03/26/22   Carman Ching, PA-C  Vitamin A 2400 MCG (8000 UT) CAPS Take 8,000 Units by mouth daily.    [provider]  vitamin B-12 (CYANOCOBALAMIN)  1000 MCG tablet Take 1,000 mcg by mouth daily.    [provider]  vitamin C (ASCORBIC ACID) 250 MG tablet Take 250 mg by mouth 2 (two) times daily.    [provider]  zinc gluconate 50 MG tablet Take 50 mg by mouth daily.    [provider]     Family History  Problem Relation Age of Onset   Prostate cancer Father        Metastatic   Cancer Father     Social History   Socioeconomic History   Marital status: Married    Spouse name: [name]   Number of children: Not on file   Years of education: Not on file   Highest education level: Not on file  Occupational History   Not on file  Tobacco Use   Smoking status: Former    Current packs/day: 0.00    Average packs/day: 0.8 packs/day for 60.0 years (45.0 ttl pk-yrs)    Types: Cigarettes    Start date: 55    Quit date: 2008    Years since quitting: 16.8    Passive exposure: Past   Smokeless tobacco: Never  Substance and Sexual Activity   Alcohol use: Yes     Alcohol/week: 2.0 standard drinks of alcohol    Types: 2 Standard drinks or equivalent per week    Comment: 14/week   Drug use: No   Sexual activity: Not on file  Other Topics Concern   Not on file  Social History Narrative   Not on file   Social Determinants of Health   Financial Resource Strain: Not on file  Food Insecurity: No Food Insecurity (12/16/2022)   Hunger Vital Sign    Worried About Running Out of Food in the Last Year: Never true    Ran Out of Food in the Last Year: Never true  Transportation Needs: No Transportation Needs (12/16/2022)   PRAPARE - Administrator, Civil Service (Medical): No    Lack of Transportation (Non-Medical): No  Physical Activity: Not on file  Stress: Not on file  Social Connections: Not on file    Review of Systems: A 12 point ROS discussed and pertinent positives are indicated in the HPI above.  All other systems are negative.  Review of Systems  Vital Signs: BP (!) 101/50 (BP Location: Right Arm)   Pulse 93   Temp 97.6 F (36.4 C)   Resp 20   Ht 5\' 6"  (1.676 m)   Wt 140 lb 10.5 oz (63.8 kg)   SpO2 98%   BMI 22.70 kg/m   Physical Exam Constitutional:      General: He is not in acute distress. HENT:     Head: Normocephalic and atraumatic.  Cardiovascular:     Rate and Rhythm: Normal rate and regular rhythm.  Pulmonary:     Effort: Pulmonary effort is normal. No respiratory distress.  Abdominal:     General: There is no distension.     Palpations: Abdomen is soft.     Tenderness: There is no abdominal tenderness.  Genitourinary:    Comments: Foley intact with clear yellow urine Skin:    General: Skin is warm and dry.  Neurological:     Mental Status: He is alert and oriented to person, place, and time.     Imaging: NM PET Image Initial (PI) Whole Body (F-18 FDG)  Result Date: 12/17/2022 CLINICAL DATA:  Initial treatment strategy for multiple myeloma. Prior history of bladder carcinoma  and prostate  carcinoma. EXAM: NUCLEAR MEDICINE PET WHOLE BODY TECHNIQUE: 7.8 mCi F-18 FDG was injected intravenously. Full-ring PET imaging was performed from the head to foot after the radiotracer. CT data was obtained and used for attenuation correction and anatomic localization. Fasting blood glucose: 97 mg/dl COMPARISON:  None Available. FINDINGS: Mediastinal blood-pool activity (background): SUV max = 2.7 Liver activity (reference): SUV max = N/A NECK:  No hypermetabolic lymph nodes or masses. Incidental CT findings:  None. CHEST: No hypermetabolic lymph nodes. No suspicious pulmonary nodules seen on CT images. Incidental CT findings:  None. ABDOMEN/PELVIS: No abnormal hypermetabolic activity within the liver, pancreas, adrenal glands, or spleen. No hypermetabolic lymph nodes in the abdomen or pelvis. Incidental CT findings: Mildly enlarged prostate gland. Foley catheter seen within bladder. Diffuse bladder wall thickening is seen, likely due to chronic bladder outlet obstruction or cystitis. Mild moderate bilateral hydroureteronephrosis is seen to the level of the bladder, likely due to vesicoureteral reflux. SKELETON: No focal lytic or sclerotic bone lesions are seen on CT images. No focal hypermetabolic bone lesions. Incidental CT findings:  None. EXTREMITIES: No focal hypermetabolic bone lesions identified. No focal lytic or sclerotic bone lesion seen on CT images. Incidental CT findings: none IMPRESSION: No focal hypermetabolic or lytic bone lesions identified. Electronically Signed   By: Danae Orleans M.D.   On: 12/17/2022 15:39   IR BONE MARROW BIOPSY & ASPIRATION  Result Date: 12/17/2022 INDICATION: Multiple myeloma workup EXAM: Bone marrow aspiration and core biopsy using fluoroscopic guidance MEDICATIONS: None. ANESTHESIA/SEDATION: Moderate (conscious) sedation was employed during this procedure. A total of Versed 0.5 mg and Fentanyl 25 mcg was administered intravenously. Moderate Sedation Time: 10 minutes.  The patient's level of consciousness and vital signs were monitored continuously by radiology nursing throughout the procedure under my direct supervision. FLUOROSCOPY TIME:  Fluoroscopy Time: 0.8 minutes (3 mGy) COMPLICATIONS: None immediate. PROCEDURE: Informed written consent was obtained from the patient after a thorough discussion of the procedural risks, benefits and alternatives. All questions were addressed. Maximal Sterile Barrier Technique was utilized including caps, mask, sterile gowns, sterile gloves, sterile drape, hand hygiene and skin antiseptic. A timeout was performed prior to the initiation of the procedure. The patient was placed prone on the exam table. Limited fluoroscopy of the pelvis was performed for planning purposes. Skin entry site was marked, and the overlying skin was prepped and draped in the standard sterile fashion. Local analgesia was obtained with 1% lidocaine. Using fluoroscopic guidance, an 11 gauge needle was advanced just deep to the cortex of the right posterior ilium. Subsequently, bone marrow aspiration and core biopsy were performed. Specimens were submitted to lab/pathology for handling. Hemostasis was achieved with manual pressure, and a clean dressing was placed. The patient tolerated the procedure well without immediate complication. IMPRESSION: Successful bone marrow aspiration and core biopsy of the right posterior ilium using fluoroscopic guidance. Electronically Signed   By: Olive Bass M.D.   On: 12/17/2022 11:54   US Renal  Result Date: 12/16/2022 CLINICAL DATA:  Acute on chronic renal failure. EXAM: RENAL / URINARY TRACT ULTRASOUND COMPLETE COMPARISON:  April 17, 2022 FINDINGS: Right Kidney: Renal measurements: 9.7 cm x 5.8 cm x 5.0 cm = volume: 146 mL. There is diffusely increased echogenicity of the renal parenchyma. A 2.6 cm x 2.5 cm x 3.0 cm and 2.5 cm x 1.9 cm x 2.1 cm cysts are seen within the right kidney. There is mild right-sided  hydronephrosis. Left Kidney: Renal measurements: 10.6 cm x 6.2  cm x 6.0 cm = volume: 207 mL. There is diffusely increased echogenicity of the renal parenchyma. 1.7 cm x 1.6 cm x 2.0 cm and 4.3 cm x 3.7 cm x 5.4 cm simple cysts are seen within the left kidney. There is moderate severity left-sided hydronephrosis. Bladder: A Foley catheter is present. Other: None. IMPRESSION: 1. Bilateral echogenic kidneys, likely secondary to medical renal disease. 2. Multiple large, bilateral simple renal cysts. 3. Bilateral hydronephrosis, left greater than right. Electronically Signed   By: Aram Candela M.D.   On: 12/16/2022 21:01    Labs:  CBC: Recent Labs    12/16/22 1513 12/17/22 0436 12/18/22 0819 12/19/22 0525  WBC 12.7* 23.9* 10.7* 9.8  HGB 9.3* 7.4* 7.4* 7.0*  HCT 28.1* 21.3* 21.2* 20.9*  PLT 327 258 253 225    COAGS: No results for input(s): "INR", "APTT" in the last 8760 hours.  BMP: Recent Labs    12/16/22 1513 12/17/22 0436 12/18/22 0819 12/19/22 0525  NA 121* 125* 129* 130*  K 5.7* 4.4 4.0 4.2  CL 99 101 100 97*  CO2 12* 13* 18* 23  GLUCOSE 136* 143* 119* 123*  BUN 133* 132* 108* 103*  CALCIUM 8.7* 8.0* 8.0* 7.9*  CREATININE 9.17* 8.43* 6.68* 6.14*  GFRNONAA 5* 6* 8* 9*    LIVER FUNCTION TESTS: Recent Labs    07/25/22 0637 12/16/22 1227 12/16/22 1513  BILITOT 0.7 0.5 0.7  AST 23 8* 9*  ALT 14 13 12   ALKPHOS 63 67 70  PROT 8.2* 8.9* 8.7*  ALBUMIN 3.5 3.5 3.2*    Assessment and Plan: This is a 77 year old male with PMHx of HTN, AAA, CKD, CAD s/p CABG, prostate cancer, bladder cancer who presented to [facility] 10/29 with acute on chronic renal failure and hyperkalemia who is known to our service s/p bone marrow biopsy on 10/30 with concerns for multiple myeloma. IR received request for renal biopsy given worsening renal function and nephrotic range proteinuria and hematuria. The patient is being followed by nephrology and urology.   The patient will be NPO after  midnight for Monday procedure on 11/4, no blood thinners taken- last dose of aspirin was 10/30- allowing for 5 day hold, labs and vitals have been reviewed.  Risks and benefits of image guided renal biopsy with moderate sedation was discussed with the patient and patient's spouse today including, but not limited to bleeding, severe bleeding that can be fatal or require blood transfusion or additional procedures, infection, damage to adjacent structures or low yield requiring additional tests.  All of the questions were answered and there is agreement to proceed.  Consent signed and in chart.   Thank you for this interesting consult.  I greatly enjoyed meeting [name] and look forward to participating in their care.  A copy of this report was sent to the requesting provider on this date.  Electronically Signed: Berneta Levins, PA-C 12/19/2022, 1:18 PM   I spent a total of 10 Minutes in face to face in clinical consultation, greater than 50% of which was counseling/coordinating care for renal biopsy.

## 2022-12-19 NOTE — Progress Notes (Addendum)
Hematology/Oncology Consult note [facility]  Telephone:([phone] [phone]  Patient Care Team: [name], MD as PCP - General (Internal Medicine) Carmina Miller, MD as Radiation Oncologist (Radiation Oncology)   Name of the patient: [name]  [id number]  07-03-1945   Date of visit: 12/19/2022  Interval history-feels somewhat better today as compared to a few days ago when he was hospitalized.  He is still significantly fatigued and appetite remains poor  ECOG PS- 3  Review of systems- Review of Systems  Constitutional:  Positive for malaise/fatigue. Negative for chills, fever and weight loss.  HENT:  Negative for congestion, ear discharge and nosebleeds.   Eyes:  Negative for blurred vision.  Respiratory:  Negative for cough, hemoptysis, sputum production, shortness of breath and wheezing.   Cardiovascular:  Negative for chest pain, palpitations, orthopnea and claudication.  Gastrointestinal:  Negative for abdominal pain, blood in stool, constipation, diarrhea, heartburn, melena, nausea and vomiting.  Genitourinary:  Negative for dysuria, flank pain, frequency, hematuria and urgency.  Musculoskeletal:  Negative for back pain, joint pain and myalgias.  Skin:  Negative for rash.  Neurological:  Negative for dizziness, tingling, focal weakness, seizures, weakness and headaches.  Endo/Heme/Allergies:  Does not bruise/bleed easily.  Psychiatric/Behavioral:  Negative for depression and suicidal ideas. The patient does not have insomnia.       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 4v CABG on 04/13/2002. b.) LHC 02/03/2013 --> LVEF 50%; severe 3v CAD with patent LIMA-LAD and SVG-OM1 grafts; SVG-PDA and SVG-DG1 occluded with collaterals to PDA and DG1 from LAD.   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   a.) LIMA-LAD, SVG-OM1, SVG-D1, SVG-PDA   Tubular adenoma of colon 02/03/2007     Past Surgical History:  Procedure Laterality Date   CARDIAC CATHETERIZATION Left 04/13/2002   Procedure: CARDIAC CATHETERIZATION; Location: [facility]; Surgeon; Marcina Millard, MD   CARDIAC CATHETERIZATION Left 02/03/2013   Procedure: CARDIAC CATHETERIZATION; Location: [facility]; Surgeon: Marcina Millard, MD   COLONOSCOPY W/ POLYPECTOMY     COLONOSCOPY WITH PROPOFOL N/A 04/09/2015   Procedure: COLONOSCOPY WITH PROPOFOL;  Surgeon: Wallace Cullens, MD;  Location: [facility] ENDOSCOPY;  Service: Gastroenterology;  Laterality: N/A;   CORONARY ANGIOPLASTY     CORONARY ARTERY BYPASS GRAFT N/A 04/13/2002   Procedure: 4v CABG (LIMA-LAD, SVG-OM1, SVG-D1, SVG-PDA); Location: [facility]; Surgeon: Rana Snare, MD   CYSTOSCOPY N/A 07/17/2021   Procedure: Bluford Kaufmann;  Surgeon: [name], MD;  Location: [facility] ORS;  Service: Urology;  Laterality: N/A;   CYSTOSCOPY N/A 06/24/2022   Procedure: DIAGNOSTIC CYSTOSCOPY;  Surgeon: [name], MD;  Location: [facility] ORS;  Service: Urology;  Laterality: N/A;   CYSTOSCOPY WITH BIOPSY N/A 07/17/2021   Procedure: CYSTOSCOPY WITH BIOPSY;  Surgeon: [name], MD;  Location: [facility] ORS;  Service: Urology;   Laterality: N/A;   CYSTOSCOPY WITH BIOPSY N/A 06/24/2022   Procedure: CYSTOSCOPY WITH BLADDER BIOPSY;  Surgeon: [name], MD;  Location: [facility] ORS;  Service: Urology;  Laterality: N/A;   CYSTOSCOPY WITH FULGERATION N/A 07/17/2021   Procedure: CYSTOSCOPY WITH FULGERATION;  Surgeon: [name], MD;  Location: [facility] ORS;  Service: Urology;  Laterality: N/A;   CYSTOSCOPY WITH FULGERATION N/A 06/24/2022   Procedure: CYSTOSCOPY WITH FULGERATION;  Surgeon: [name], MD;  Location: [facility] ORS;  Service: Urology;  Laterality: N/A;   EYE SURGERY     both eyes with cataract with lens 2021   IR BONE MARROW BIOPSY & ASPIRATION  12/17/2022   TONSILLECTOMY     TRANSURETHRAL RESECTION OF BLADDER TUMOR N/A 11/13/2020   Procedure: TRANSURETHRAL RESECTION OF BLADDER TUMOR (TURBT);  Surgeon: [name], MD;  Location: [facility] ORS;  Service: Urology;  Laterality: N/A;   TRANSURETHRAL RESECTION OF BLADDER TUMOR N/A 06/24/2022   Procedure: TRANSURETHRAL RESECTION OF BLADDER TUMOR (TURBT);  Surgeon: [name], MD;  Location: [facility] ORS;  Service: Urology;  Laterality: N/A;   TRANSURETHRAL RESECTION OF BLADDER TUMOR WITH GYRUS (TURBT-GYRUS)  06/24/2022   TRANSURETHRAL RESECTION OF PROSTATE      Social History   Socioeconomic History   Marital status: Married    Spouse name: [name]   Number of children: Not on file   Years of education: Not on file   Highest education level: Not on file  Occupational History   Not on file  Tobacco Use   Smoking status: Former    Current packs/day: 0.00    Average packs/day: 0.8 packs/day for 60.0 years (45.0 ttl pk-yrs)    Types: Cigarettes    Start date: 51    Quit date: 2008    Years since quitting: 16.8    Passive exposure: Past   Smokeless tobacco: Never  Substance and Sexual Activity   Alcohol use: Yes    Alcohol/week: 2.0 standard drinks of alcohol    Types: 2 Standard drinks or equivalent per week    Comment: 14/week   Drug use: No    Sexual activity: Not on file  Other Topics Concern   Not on file  Social History Narrative   Not on file   Social Determinants of Health   Financial Resource Strain: Not on file  Food Insecurity: No Food Insecurity (12/16/2022)   Hunger Vital Sign    Worried About Running Out of Food in the Last Year: Never true    Ran Out of Food in the Last Year: Never true  Transportation Needs: No Transportation Needs (12/16/2022)   PRAPARE - Administrator, Civil Service (Medical): No    Lack of Transportation (Non-Medical): No  Physical Activity: Not on file  Stress: Not on file  Social Connections: Not on file  Intimate Partner Violence: Not At Risk (12/16/2022)   Humiliation, Afraid, Rape, and Kick questionnaire    Fear of Current or Ex-Partner: No    Emotionally Abused: No    Physically Abused: No    Sexually Abused: No    Family History  Problem Relation Age of Onset  Prostate cancer Father        Metastatic   Cancer Father      Current Facility-Administered Medications:    atorvastatin (LIPITOR) tablet 10 mg, 10 mg, Oral, Daily, Djan, Prince T, MD, 10 mg at 12/19/22 1610   Chlorhexidine Gluconate Cloth 2 % PADS 6 each, 6 each, Topical, Daily, Sreenath, Sudheer B, MD, 6 each at 12/19/22 1000   feeding supplement (ENSURE ENLIVE / ENSURE PLUS) liquid 237 mL, 237 mL, Oral, BID BM, Djan, Prince T, MD, 237 mL at 12/19/22 1539   ferrous sulfate tablet 324 mg, 324 mg, Oral, Q breakfast, Sreenath, Sudheer B, MD, 324 mg at 12/19/22 0837   fluticasone (FLONASE) 50 MCG/ACT nasal spray 1 spray, 1 spray, Each Nare, PRN, Georgeann Oppenheim, Sudheer B, MD   loratadine (CLARITIN) tablet 10 mg, 10 mg, Oral, Daily, Sreenath, Sudheer B, MD, 10 mg at 12/19/22 9604   melatonin tablet 2.5 mg, 2.5 mg, Oral, QHS PRN, Mansy, Jan A, MD, 2.5 mg at 12/18/22 2341   morphine (PF) 2 MG/ML injection 2 mg, 2 mg, Intravenous, Q2H PRN, Andris Baumann, MD, 2 mg at 12/17/22 0227   multivitamin with minerals  tablet 1 tablet, 1 tablet, Oral, Daily, Sreenath, Sudheer B, MD, 1 tablet at 12/19/22 0837   sodium bicarbonate 150 mEq in dextrose 5 % 1,150 mL infusion, , Intravenous, Continuous, Ray, Neha, MD, Last Rate: 75 mL/hr at 12/19/22 1126, Infusion Verify at 12/19/22 1126  Physical exam:  Vitals:   12/18/22 1958 12/19/22 0235 12/19/22 0811 12/19/22 1444  BP: (!) 102/48 (!) 107/53 (!) 101/50 98/61  Pulse: 95 (!) 101 93 92  Resp: 18 18 20 18   Temp: 98.3 F (36.8 C) 98.8 F (37.1 C) 97.6 F (36.4 C) 98.3 F (36.8 C)  TempSrc: Oral Oral  Oral  SpO2: 100% 99% 98% 99%  Weight:      Height:       Physical Exam Constitutional:      Comments: Sitting up in the chair.  Appears fatigued  Cardiovascular:     Rate and Rhythm: Normal rate and regular rhythm.     Heart sounds: Normal heart sounds.  Pulmonary:     Effort: Pulmonary effort is normal.     Breath sounds: Normal breath sounds.  Abdominal:     General: Bowel sounds are normal.     Palpations: Abdomen is soft.  Skin:    General: Skin is warm and dry.  Neurological:     Mental Status: He is alert and oriented to person, place, and time.         Latest Ref Rng & Units 12/19/2022    5:25 AM  CMP  Glucose 70 - 99 mg/dL 540   BUN 8 - 23 mg/dL 981   Creatinine 1.91 - 1.24 mg/dL 4.78   Sodium 295 - 621 mmol/L 130   Potassium 3.5 - 5.1 mmol/L 4.2   Chloride 98 - 111 mmol/L 97   CO2 22 - 32 mmol/L 23   Calcium 8.9 - 10.3 mg/dL 7.9       Latest Ref Rng & Units 12/19/2022    5:25 AM  CBC  WBC 4.0 - 10.5 K/uL 9.8   Hemoglobin 13.0 - 17.0 g/dL 7.0   Hematocrit 30.8 - 52.0 % 20.9   Platelets 150 - 400 K/uL 225     @IMAGES @  NM PET Image Initial (PI) Whole Body (F-18 FDG)  Result Date: 12/17/2022 CLINICAL DATA:  Initial treatment strategy for multiple myeloma.  Prior history of bladder carcinoma and prostate carcinoma. EXAM: NUCLEAR MEDICINE PET WHOLE BODY TECHNIQUE: 7.8 mCi F-18 FDG was injected intravenously. Full-ring PET  imaging was performed from the head to foot after the radiotracer. CT data was obtained and used for attenuation correction and anatomic localization. Fasting blood glucose: 97 mg/dl COMPARISON:  None Available. FINDINGS: Mediastinal blood-pool activity (background): SUV max = 2.7 Liver activity (reference): SUV max = N/A NECK:  No hypermetabolic lymph nodes or masses. Incidental CT findings:  None. CHEST: No hypermetabolic lymph nodes. No suspicious pulmonary nodules seen on CT images. Incidental CT findings:  None. ABDOMEN/PELVIS: No abnormal hypermetabolic activity within the liver, pancreas, adrenal glands, or spleen. No hypermetabolic lymph nodes in the abdomen or pelvis. Incidental CT findings: Mildly enlarged prostate gland. Foley catheter seen within bladder. Diffuse bladder wall thickening is seen, likely due to chronic bladder outlet obstruction or cystitis. Mild moderate bilateral hydroureteronephrosis is seen to the level of the bladder, likely due to vesicoureteral reflux. SKELETON: No focal lytic or sclerotic bone lesions are seen on CT images. No focal hypermetabolic bone lesions. Incidental CT findings:  None. EXTREMITIES: No focal hypermetabolic bone lesions identified. No focal lytic or sclerotic bone lesion seen on CT images. Incidental CT findings: none IMPRESSION: No focal hypermetabolic or lytic bone lesions identified. Electronically Signed   By: Danae Orleans M.D.   On: 12/17/2022 15:39   IR BONE MARROW BIOPSY & ASPIRATION  Result Date: 12/17/2022 INDICATION: Multiple myeloma workup EXAM: Bone marrow aspiration and core biopsy using fluoroscopic guidance MEDICATIONS: None. ANESTHESIA/SEDATION: Moderate (conscious) sedation was employed during this procedure. A total of Versed 0.5 mg and Fentanyl 25 mcg was administered intravenously. Moderate Sedation Time: 10 minutes. The patient's level of consciousness and vital signs were monitored continuously by radiology nursing throughout the  procedure under my direct supervision. FLUOROSCOPY TIME:  Fluoroscopy Time: 0.8 minutes (3 mGy) COMPLICATIONS: None immediate. PROCEDURE: Informed written consent was obtained from the patient after a thorough discussion of the procedural risks, benefits and alternatives. All questions were addressed. Maximal Sterile Barrier Technique was utilized including caps, mask, sterile gowns, sterile gloves, sterile drape, hand hygiene and skin antiseptic. A timeout was performed prior to the initiation of the procedure. The patient was placed prone on the exam table. Limited fluoroscopy of the pelvis was performed for planning purposes. Skin entry site was marked, and the overlying skin was prepped and draped in the standard sterile fashion. Local analgesia was obtained with 1% lidocaine. Using fluoroscopic guidance, an 11 gauge needle was advanced just deep to the cortex of the right posterior ilium. Subsequently, bone marrow aspiration and core biopsy were performed. Specimens were submitted to lab/pathology for handling. Hemostasis was achieved with manual pressure, and a clean dressing was placed. The patient tolerated the procedure well without immediate complication. IMPRESSION: Successful bone marrow aspiration and core biopsy of the right posterior ilium using fluoroscopic guidance. Electronically Signed   By: Olive Bass M.D.   On: 12/17/2022 11:54   US Renal  Result Date: 12/16/2022 CLINICAL DATA:  Acute on chronic renal failure. EXAM: RENAL / URINARY TRACT ULTRASOUND COMPLETE COMPARISON:  April 17, 2022 FINDINGS: Right Kidney: Renal measurements: 9.7 cm x 5.8 cm x 5.0 cm = volume: 146 mL. There is diffusely increased echogenicity of the renal parenchyma. A 2.6 cm x 2.5 cm x 3.0 cm and 2.5 cm x 1.9 cm x 2.1 cm cysts are seen within the right kidney. There is mild right-sided hydronephrosis. Left Kidney: Renal measurements:  10.6 cm x 6.2 cm x 6.0 cm = volume: 207 mL. There is diffusely increased  echogenicity of the renal parenchyma. 1.7 cm x 1.6 cm x 2.0 cm and 4.3 cm x 3.7 cm x 5.4 cm simple cysts are seen within the left kidney. There is moderate severity left-sided hydronephrosis. Bladder: A Foley catheter is present. Other: None. IMPRESSION: 1. Bilateral echogenic kidneys, likely secondary to medical renal disease. 2. Multiple large, bilateral simple renal cysts. 3. Bilateral hydronephrosis, left greater than right. Electronically Signed   By: Aram Candela M.D.   On: 12/16/2022 21:01     Assessment and plan- Patient is a 77 y.o. male admitted for acute kidney injury  AKI: Serum creatinine improving with IV fluids and after placement of Foley catheter.   The combination of acute kidney injury and anemia was concerning for multiple myeloma.  PET CT scan however did not show any evidence of lytic lesion.  Bone marrow biopsy shows 15% plasma cells and although we are dealing with plasma cell dyscrasia it is unclear if he truly has overt multiple myeloma.  Typically acute kidney injury caused by contrast nephropathy should have a significantly elevated free light chain ratio.  Patient's free light chain ratio is around 13 which typically should not lead to acute kidney injury and a creatinine of 9.  Anemia can be seen in the setting of AKI and unrelated to myeloma.  Therefore before classifying him as overt multiple myeloma (and diagnosis can be made when there are more than 10% plasma cells in the bone marrow plus one of the 4 CRAB) criteria-and treating him as such-I would favor getting kidney biopsy to see if there is evidence of Cast nephropathy or if there is any evidence of other etiology that can explain his kidney injury.  I have communicated all this to nephrology team in great detail and hopefully we can get a biopsy next week   Visit Diagnosis 1. Acute renal failure superimposed on chronic kidney disease, unspecified acute renal failure type, unspecified CKD stage (HCC)   2.  Hyponatremia   3. Hyperkalemia   4. Anemia, unspecified type      Dr. Owens Shark, MD, MPH [facility] at [facility] [phone] 12/19/2022 4:39 PM

## 2022-12-20 ENCOUNTER — Inpatient Hospital Stay: Payer: Medicare Other

## 2022-12-20 DIAGNOSIS — N179 Acute kidney failure, unspecified: Secondary | ICD-10-CM | POA: Diagnosis not present

## 2022-12-20 LAB — CBC WITH DIFFERENTIAL/PLATELET
Abs Immature Granulocytes: 0.06 10*3/uL (ref 0.00–0.07)
Basophils Absolute: 0 10*3/uL (ref 0.0–0.1)
Basophils Relative: 0 %
Eosinophils Absolute: 0.3 10*3/uL (ref 0.0–0.5)
Eosinophils Relative: 3 %
HCT: 21.8 % — ABNORMAL LOW (ref 39.0–52.0)
Hemoglobin: 7.2 g/dL — ABNORMAL LOW (ref 13.0–17.0)
Immature Granulocytes: 1 %
Lymphocytes Relative: 8 %
Lymphs Abs: 0.8 10*3/uL (ref 0.7–4.0)
MCH: 31.9 pg (ref 26.0–34.0)
MCHC: 33 g/dL (ref 30.0–36.0)
MCV: 96.5 fL (ref 80.0–100.0)
Monocytes Absolute: 0.8 10*3/uL (ref 0.1–1.0)
Monocytes Relative: 9 %
Neutro Abs: 7 10*3/uL (ref 1.7–7.7)
Neutrophils Relative %: 79 %
Platelets: 210 10*3/uL (ref 150–400)
RBC: 2.26 MIL/uL — ABNORMAL LOW (ref 4.22–5.81)
RDW: 13.6 % (ref 11.5–15.5)
WBC: 9 10*3/uL (ref 4.0–10.5)
nRBC: 0 % (ref 0.0–0.2)

## 2022-12-20 LAB — BASIC METABOLIC PANEL
Anion gap: 9 (ref 5–15)
BUN: 97 mg/dL — ABNORMAL HIGH (ref 8–23)
CO2: 28 mmol/L (ref 22–32)
Calcium: 8.1 mg/dL — ABNORMAL LOW (ref 8.9–10.3)
Chloride: 96 mmol/L — ABNORMAL LOW (ref 98–111)
Creatinine, Ser: 5.3 mg/dL — ABNORMAL HIGH (ref 0.61–1.24)
GFR, Estimated: 10 mL/min — ABNORMAL LOW (ref >60)
Glucose, Bld: 137 mg/dL — ABNORMAL HIGH (ref 70–99)
Potassium: 3.9 mmol/L (ref 3.5–5.1)
Sodium: 133 mmol/L — ABNORMAL LOW (ref 135–145)

## 2022-12-20 LAB — PROTIME-INR
INR: 1.2 (ref 0.8–1.2)
Prothrombin Time: 15.2 s (ref 11.4–15.2)

## 2022-12-20 MED ORDER — SIMETHICONE 80 MG PO CHEW
80.0000 mg | CHEWABLE_TABLET | Freq: Four times a day (QID) | ORAL | Status: DC
  Administered 2022-12-20 – 2022-12-25 (×18): 80 mg via ORAL
  Filled 2022-12-20 (×19): qty 1

## 2022-12-20 MED ORDER — SENNOSIDES-DOCUSATE SODIUM 8.6-50 MG PO TABS
1.0000 | ORAL_TABLET | Freq: Two times a day (BID) | ORAL | Status: DC
  Administered 2022-12-20 – 2022-12-24 (×9): 1 via ORAL
  Filled 2022-12-20 (×9): qty 1

## 2022-12-20 NOTE — Plan of Care (Signed)

## 2022-12-20 NOTE — Progress Notes (Signed)
Physical Therapy Treatment Patient Details Name: [name] MRN: [id number] DOB: 27-Feb-1945 Today's Date: 12/20/2022   History of Present Illness 77 y.o. male with medical history significant of hypertension, hyperlipidemia, coronary artery disease status post CABG, history of bladder cancer and prostate cancer s/p radiation who follows up with urology closely.    PT Comments  Pt is progressing with decrease assist needed for bed mobility (Min A) and transfers (CGA with RW).  Pt reported not feeling well due to constipation and was willing to ambulate in hallway ( with RW at [facility]) to help with constipation.  Pt started to became incontinent of bowels towards the end of walk. Pt remained balanced and continued to bathroom with Mod encouragment, at CGA level.  Continued PT will assist pt to progress towards mobility and activity tolerance as to PLOF.   If plan is discharge home, recommend the following: A little help with walking and/or transfers;A little help with bathing/dressing/bathroom;Assistance with feeding;Assist for transportation   Can travel by private vehicle        Equipment Recommendations  Rollator (4 wheels);Rolling walker (2 wheels)    Recommendations for Other Services       Precautions / Restrictions Precautions Precautions: Fall Restrictions Weight Bearing Restrictions: No     Mobility  Bed Mobility Overal bed mobility: Needs Assistance Bed Mobility: Supine to Sit     Supine to sit: Min assist     General bed mobility comments: cues for sequence and body mechanics    Transfers Overall transfer level: Needs assistance Equipment used: Rolling walker (2 wheels) Transfers: Sit to/from Stand, Bed to chair/wheelchair/BSC Sit to Stand: Contact guard assist   Step pivot transfers: Contact guard assist            Ambulation/Gait Ambulation/Gait assistance: Contact guard assist Gait Distance (Feet): 160 Feet Assistive device: Rolling walker (2  wheels) Gait Pattern/deviations: Step-through pattern, Decreased step length - right, Decreased step length - left Gait velocity: decreased     General Gait Details: Pt typically does not use walker but currently relies on UE support for balance. He was able to maintain a slow cadence Good effort, SpO2 remained in the mid/high 90s on room air   Stairs             Wheelchair Mobility     Tilt Bed    Modified Rankin (Stroke Patients Only)       Balance Overall balance assessment: Needs assistance Sitting-balance support: No upper extremity supported, Feet supported Sitting balance-Leahy Scale: Good     Standing balance support: Bilateral upper extremity supported, During functional activity, Reliant on assistive device for balance Standing balance-Leahy Scale: Fair Standing balance comment: reliant on the walker/UEs but no LOBs                            Cognition Arousal: Obtunded Behavior During Therapy: WFL for tasks assessed/performed Overall Cognitive Status: Impaired/Different from baseline Area of Impairment: Problem solving, Following commands                       Following Commands: Follows multi-step commands with increased time     Problem Solving: Slow processing, Difficulty sequencing General Comments: off from baseline, delayed but oriented        Exercises      General Comments        Pertinent Vitals/Pain Pain Assessment Faces Pain Scale: Hurts little more Pain Location: abdominal/gas  pain/tightness Pain Descriptors / Indicators: Aching, Grimacing Pain Intervention(s): Monitored during session, Repositioned    Home Living                          Prior Function            PT Goals (current goals can now be found in the care plan section) Acute Rehab PT Goals Patient Stated Goal: eat something PT Goal Formulation: With patient Time For Goal Achievement: 12/18/22 Potential to Achieve Goals:  Good Progress towards PT goals: Progressing toward goals    Frequency    Min 1X/week      PT Plan      Co-evaluation              AM-PAC PT "6 Clicks" Mobility   Outcome Measure  Help needed turning from your back to your side while in a flat bed without using bedrails?: A Little Help needed moving from lying on your back to sitting on the side of a flat bed without using bedrails?: A Little Help needed moving to and from a bed to a chair (including a wheelchair)?: A Little Help needed standing up from a chair using your arms (e.g., wheelchair or bedside chair)?: A Little Help needed to walk in hospital room?: A Little Help needed climbing 3-5 steps with a railing? : A Lot 6 Click Score: 17    End of Session Equipment Utilized During Treatment: Gait belt Activity Tolerance: Patient tolerated treatment well Patient left: Other (comment);with nursing/sitter in room;with family/visitor present (on bathroom commode, nursing present.) Nurse Communication: Mobility status PT Visit Diagnosis: Muscle weakness (generalized) (M62.81);Difficulty in walking, not elsewhere classified (R26.2)     Time: 1027-2536 PT Time Calculation (min) (ACUTE ONLY): 34 min  Charges:    $Gait Training: 8-22 mins $Therapeutic Activity: 8-22 mins PT General Charges $$ ACUTE PT VISIT: 1 Visit                     Hortencia Conradi, PTA  12/20/22, 3:56 PM

## 2022-12-20 NOTE — Plan of Care (Signed)

## 2022-12-20 NOTE — Plan of Care (Signed)
  Problem: Elimination: Goal: Will not experience complications related to urinary retention Outcome: Progressing   Problem: Safety: Goal: Ability to remain free from injury will improve Outcome: Progressing   Problem: Skin Integrity: Goal: Risk for impaired skin integrity will decrease Outcome: Progressing   

## 2022-12-20 NOTE — Progress Notes (Signed)
[facility]  PROGRESS NOTE   Subjective:   Out of bed to chair.  Feels much better.  Ambulating today. Urine output is 1.8 L.  Objective:  Vital signs: Blood pressure (!) 100/50, pulse 83, temperature 98.3 F (36.8 C), temperature source Oral, resp. rate 18, height 5\' 6"  (1.676 m), weight 63.8 kg, SpO2 97%.  Intake/Output Summary (Last 24 hours) at 12/20/2022 1403 Last data filed at 12/20/2022 1252 Gross per 24 hour  Intake 1496.47 ml  Output 3300 ml  Net -1803.53 ml   Filed Weights   12/16/22 1900 12/17/22 0826  Weight: 63.8 kg 63.8 kg     Physical Exam: General:  No acute distress  Head:  Normocephalic, atraumatic. Moist oral mucosal membranes  Eyes:  Anicteric  Neck:  Supple  Lungs:   Clear to auscultation, normal effort  Heart:  S1S2 no rubs  Abdomen:   Soft, nontender, bowel sounds present  Extremities:  peripheral edema.  Neurologic:  Awake, alert, following commands  Skin:  No lesions  Access:     Basic Metabolic Panel: Recent Labs  Lab 12/16/22 1513 12/17/22 0436 12/18/22 0819 12/19/22 0525 12/20/22 0352  NA 121* 125* 129* 130* 133*  K 5.7* 4.4 4.0 4.2 3.9  CL 99 101 100 97* 96*  CO2 12* 13* 18* 23 28  GLUCOSE 136* 143* 119* 123* 137*  BUN 133* 132* 108* 103* 97*  CREATININE 9.17* 8.43* 6.68* 6.14* 5.30*  CALCIUM 8.7* 8.0* 8.0* 7.9* 8.1*  MG 3.2*  --   --   --   --    GFR: Estimated Creatinine Clearance: 10.4 mL/min (A) (by C-G formula based on SCr of 5.3 mg/dL (H)).  Liver Function Tests: Recent Labs  Lab 12/16/22 1227 12/16/22 1513  AST 8* 9*  ALT 13 12  ALKPHOS 67 70  BILITOT 0.5 0.7  PROT 8.9* 8.7*  ALBUMIN 3.5 3.2*   No results for input(s): "LIPASE", "AMYLASE" in the last 168 hours. No results for input(s): "AMMONIA" in the last 168 hours.  CBC: Recent Labs  Lab 12/16/22 1513 12/17/22 0436 12/18/22 0819 12/19/22 0525 12/20/22 0352  WBC 12.7* 23.9* 10.7* 9.8 9.0  NEUTROABS 11.0* 22.4* 9.1* 7.7 7.0  HGB 9.3*  7.4* 7.4* 7.0* 7.2*  HCT 28.1* 21.3* 21.2* 20.9* 21.8*  MCV 96.9 94.7 92.2 95.4 96.5  PLT 327 258 253 225 210     HbA1C: No results found for: "HGBA1C"  Urinalysis: No results for input(s): "COLORURINE", "LABSPEC", "PHURINE", "GLUCOSEU", "HGBUR", "BILIRUBINUR", "KETONESUR", "PROTEINUR", "UROBILINOGEN", "NITRITE", "LEUKOCYTESUR" in the last 72 hours.  Invalid input(s): "APPERANCEUR"    Imaging: No results found.   Medications:    sodium bicarbonate 150 mEq in dextrose 5 % 1,150 mL infusion 75 mL/hr at 12/19/22 1940    atorvastatin  10 mg Oral Daily   Chlorhexidine Gluconate Cloth  6 each Topical Daily   feeding supplement  237 mL Oral BID BM   loratadine  10 mg Oral Daily   multivitamin with minerals  1 tablet Oral Daily   senna-docusate  1 tablet Oral BID   simethicone  80 mg Oral QID    Assessment/ Plan:     77 year old white male with history of hypertension, hyperlipidemia, coronary artery disease, status post CABG, history of bladder cancer and also prostate cancer s/p radiation treatments, chronic kidney disease and a diagnosis of multiple myeloma now admitted with history of acute kidney injury.  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  Agree to continue the bicarbonate drip.  Monitor urine output closely.  Patient is scheduled for renal biopsy on Monday.  #2: Anemia: Anemia is most likely secondary to multiple myeloma.  Heme-onc is on the case.  #3: Metabolic acidosis: Patient has been on bicarbonate drip.  Will possibly change to IV isotonic saline from tomorrow.  Spoke to the patient and his wife at bedside in detail.  Labs and medications reviewed. Will continue to follow along with you.   LOS: 4 Lorain Childes, MD [facility] 11/2/20242:03 PM

## 2022-12-20 NOTE — Progress Notes (Signed)
PROGRESS NOTE    [name]  HYQ:[id number] DOB: [birth year] DOA: 12/16/2022 PCP: [name], MD    Brief Narrative:   77 y.o. male with medical history significant of hypertension, hyperlipidemia, coronary artery disease status post CABG, history of bladder cancer and prostate cancer s/p radiation who follows up with urology closely.  Patient presents to the emergency room today after he was called to come to the emergency room by hematology [name] on account of having worsening renal function and hyperkalemia.  According to patient in August he followed up with his primary care physician and he was found to have worsening anemia and therefore referred to hematology for further management.  Patient saw hematologist today [name] and underwent some blood work and was later called to come to the emergency room on account of the abnormal labs.    Assessment & Plan:   Principal Problem:   AKI (acute kidney injury) (HCC)  Acute renal failure likely in the setting of possible multiple myeloma Patient presented with creatinine of 9 Baseline creatinine of 1.5-2 in the last 4 to 5 months  Elevated free kappa and lambda obtained in September 2024 Given elevated protein gap as well as rapidly worsening renal function in the setting of anemia, this raises high suspicion for multiple myeloma Nephrology on board Status post bone marrow biopsy 10/30 Status post inpatient PET scan 10/30 Plan: Kidney function is improving.  Creatinine downtrending.  Patient had some clots that were evacuated by bedside RN on 10/30.  Urine now clearing up, now pale yellow.  Case discussed at length with nephrology, oncology, urology.  Consultations appreciated.  No indication for urgent dialysis at this time.  If creatinine plateaus urology to consider nephrostomy tubes.  Continue to trend daily renal function.  Oncology to follow-up results of PET scan and bone marrow biopsy.  Normocytic anemia likely 2  secondary to anemia of chronic disease No indication for blood transfusion at this time Monitor CBC closely Transfuse as needed hemoglobin less than 7   Hyperkalemia in the setting of worsening renal failure Patient received hyperkalemia cocktail in the emergency room Potassium has improved Trend daily renal function   Mild leukocytosis Downtrending No clear evidence of infection Continue to monitor closely No indication for antibiotics   Severe metabolic acidosis secondary to acute renal failure Renal tubular acidosis type IV Continue IV bicarbonate Monitor BMP closely   Hyponatremia Suspect pseudohyponatremia in the setting of plasma cell dyscrasia As evidenced by hyperosmolarity Plan: Continue IV bicarbonate Avoid saline infusion  Hyperlipidemia PTA statin   Hypertension Hold BP meds at this time (home medications include Imdur metoprolol)   History of bladder cancer History of prostate cancer Outpatient follow-up with urology   Coronary artery disease status post CABG Continue statin therapy and aspirin    DVT prophylaxis: SCDs Code Status: Full Family Communication: Spouse at bedside 10/30, 10/31, 11/1, 11/2 Disposition Plan: Status is: Inpatient Remains inpatient appropriate because: Severe acute renal failure in the setting of suspected plasma cell dyscrasia   Level of care: Telemetry Medical  Consultants:  Nephrology Hematology oncology  Procedures:  Pulmonary biopsy 10/30 CT PET scan 10/30  Antimicrobials: None   Subjective: Seen and examined.  Sitting up in bed.  Motivated.  Wants to walk.  No pain complaints.  Objective: Vitals:   12/19/22 1444 12/19/22 1953 12/20/22 0327 12/20/22 0745  BP: 98/61 (!) 93/51 (!) 109/47 (!) 100/50  Pulse: 92 89 88 83  Resp: 18 16 16  18  Temp: 98.3 F (36.8 C) 98.3 F (36.8 C) 97.9 F (36.6 C) 98.3 F (36.8 C)  TempSrc: Oral   Oral  SpO2: 99% 100% 97% 97%  Weight:      Height:         Intake/Output Summary (Last 24 hours) at 12/20/2022 1205 Last data filed at 12/20/2022 0745 Gross per 24 hour  Intake 1259.47 ml  Output 2100 ml  Net -840.53 ml   Filed Weights   12/16/22 1900 12/17/22 0826  Weight: 63.8 kg 63.8 kg    Examination:  General exam: NAD Respiratory system: Lungs clear.  Normal work of breathing.  Room air Cardiovascular system: S1-S2, RRR, no murmurs, no pedal edema Gastrointestinal system: Soft, NT/ND, normal bowel sounds Central nervous system: Awake.  Oriented x 3.  No focal deficits Extremities: Symmetric 5 x 5 power. Skin: No rashes, lesions or ulcers Psychiatry: Judgement and insight appear normal. Mood & affect appropriate.     Data Reviewed: I have personally reviewed following labs and imaging studies  CBC: Recent Labs  Lab 12/16/22 1513 12/17/22 0436 12/18/22 0819 12/19/22 0525 12/20/22 0352  WBC 12.7* 23.9* 10.7* 9.8 9.0  NEUTROABS 11.0* 22.4* 9.1* 7.7 7.0  HGB 9.3* 7.4* 7.4* 7.0* 7.2*  HCT 28.1* 21.3* 21.2* 20.9* 21.8*  MCV 96.9 94.7 92.2 95.4 96.5  PLT 327 258 253 225 210   Basic Metabolic Panel: Recent Labs  Lab 12/16/22 1513 12/17/22 0436 12/18/22 0819 12/19/22 0525 12/20/22 0352  NA 121* 125* 129* 130* 133*  K 5.7* 4.4 4.0 4.2 3.9  CL 99 101 100 97* 96*  CO2 12* 13* 18* 23 28  GLUCOSE 136* 143* 119* 123* 137*  BUN 133* 132* 108* 103* 97*  CREATININE 9.17* 8.43* 6.68* 6.14* 5.30*  CALCIUM 8.7* 8.0* 8.0* 7.9* 8.1*  MG 3.2*  --   --   --   --    GFR: Estimated Creatinine Clearance: 10.4 mL/min (A) (by C-G formula based on SCr of 5.3 mg/dL (H)). Liver Function Tests: Recent Labs  Lab 12/16/22 1227 12/16/22 1513  AST 8* 9*  ALT 13 12  ALKPHOS 67 70  BILITOT 0.5 0.7  PROT 8.9* 8.7*  ALBUMIN 3.5 3.2*   No results for input(s): "LIPASE", "AMYLASE" in the last 168 hours. No results for input(s): "AMMONIA" in the last 168 hours. Coagulation Profile: Recent Labs  Lab 12/20/22 0352  INR 1.2    Cardiac Enzymes: No results for input(s): "CKTOTAL", "CKMB", "CKMBINDEX", "TROPONINI" in the last 168 hours. BNP (last 3 results) No results for input(s): "PROBNP" in the last 8760 hours. HbA1C: No results for input(s): "HGBA1C" in the last 72 hours. CBG: Recent Labs  Lab 12/17/22 1147  GLUCAP 97   Lipid Profile: No results for input(s): "CHOL", "HDL", "LDLCALC", "TRIG", "CHOLHDL", "LDLDIRECT" in the last 72 hours. Thyroid Function Tests: No results for input(s): "TSH", "T4TOTAL", "FREET4", "T3FREE", "THYROIDAB" in the last 72 hours.  Anemia Panel: No results for input(s): "VITAMINB12", "FOLATE", "FERRITIN", "TIBC", "IRON", "RETICCTPCT" in the last 72 hours.  Sepsis Labs: Recent Labs  Lab 12/17/22 0436  PROCALCITON 3.60    No results found for this or any previous visit (from the past 240 hour(s)).       Radiology Studies: No results found.      Scheduled Meds:  atorvastatin  10 mg Oral Daily   Chlorhexidine Gluconate Cloth  6 each Topical Daily   feeding supplement  237 mL Oral BID BM   loratadine  10 mg Oral  Daily   multivitamin with minerals  1 tablet Oral Daily   senna-docusate  1 tablet Oral BID   simethicone  80 mg Oral QID   Continuous Infusions:  sodium bicarbonate 150 mEq in dextrose 5 % 1,150 mL infusion 75 mL/hr at 12/19/22 1940     LOS: 4 days     Tresa Moore, MD [facility]   If 7PM-7AM, please contact night-coverage  12/20/2022, 12:05 PM

## 2022-12-21 DIAGNOSIS — N179 Acute kidney failure, unspecified: Secondary | ICD-10-CM | POA: Diagnosis not present

## 2022-12-21 LAB — BASIC METABOLIC PANEL
Anion gap: 8 (ref 5–15)
BUN: 90 mg/dL — ABNORMAL HIGH (ref 8–23)
CO2: 31 mmol/L (ref 22–32)
Calcium: 8.2 mg/dL — ABNORMAL LOW (ref 8.9–10.3)
Chloride: 95 mmol/L — ABNORMAL LOW (ref 98–111)
Creatinine, Ser: 4.6 mg/dL — ABNORMAL HIGH (ref 0.61–1.24)
GFR, Estimated: 12 mL/min — ABNORMAL LOW (ref >60)
Glucose, Bld: 125 mg/dL — ABNORMAL HIGH (ref 70–99)
Potassium: 4.2 mmol/L (ref 3.5–5.1)
Sodium: 134 mmol/L — ABNORMAL LOW (ref 135–145)

## 2022-12-21 LAB — CBC WITH DIFFERENTIAL/PLATELET
Abs Immature Granulocytes: 0.06 10*3/uL (ref 0.00–0.07)
Basophils Absolute: 0 10*3/uL (ref 0.0–0.1)
Basophils Relative: 0 %
Eosinophils Absolute: 0.4 10*3/uL (ref 0.0–0.5)
Eosinophils Relative: 5 %
HCT: 23.3 % — ABNORMAL LOW (ref 39.0–52.0)
Hemoglobin: 7.7 g/dL — ABNORMAL LOW (ref 13.0–17.0)
Immature Granulocytes: 1 %
Lymphocytes Relative: 10 %
Lymphs Abs: 0.9 10*3/uL (ref 0.7–4.0)
MCH: 32 pg (ref 26.0–34.0)
MCHC: 33 g/dL (ref 30.0–36.0)
MCV: 96.7 fL (ref 80.0–100.0)
Monocytes Absolute: 1 10*3/uL (ref 0.1–1.0)
Monocytes Relative: 11 %
Neutro Abs: 6.5 10*3/uL (ref 1.7–7.7)
Neutrophils Relative %: 73 %
Platelets: 225 10*3/uL (ref 150–400)
RBC: 2.41 MIL/uL — ABNORMAL LOW (ref 4.22–5.81)
RDW: 13.4 % (ref 11.5–15.5)
WBC: 8.9 10*3/uL (ref 4.0–10.5)
nRBC: 0 % (ref 0.0–0.2)

## 2022-12-21 NOTE — Progress Notes (Signed)
Mobility Specialist - Progress Note    12/21/22 1358  Mobility  Activity Ambulated with assistance in hallway  Level of Assistance Modified independent, requires aide device or extra time  Assistive Device Front wheel walker  Distance Ambulated (ft) 340 ft  Range of Motion/Exercises Active  Activity Response Tolerated well  Mobility Referral Yes  $Mobility charge 1 Mobility   Pt resting in recliner on RA upon entry. Pt STS and ambulates to hallway around NS ModI with RW. Pt performed two laps. Pt returned to recliner and left with needs in reach.   Johnathan Hausen Mobility Specialist 12/21/22, 2:00 PM

## 2022-12-21 NOTE — Progress Notes (Signed)
PROGRESS NOTE    [name]  KVQ:[id number] DOB: May 09, 1945 DOA: 12/16/2022 PCP: [name], MD    Brief Narrative:   77 y.o. male with medical history significant of hypertension, hyperlipidemia, coronary artery disease status post CABG, history of bladder cancer and prostate cancer s/p radiation who follows up with urology closely.  Patient presents to the emergency room today after he was called to come to the emergency room by hematology [name] on account of having worsening renal function and hyperkalemia.  According to patient in August he followed up with his primary care physician and he was found to have worsening anemia and therefore referred to hematology for further management.  Patient saw hematologist today [name] and underwent some blood work and was later called to come to the emergency room on account of the abnormal labs.    Assessment & Plan:   Principal Problem:   AKI (acute kidney injury) (HCC)  Acute renal failure likely in the setting of possible multiple myeloma Patient presented with creatinine of 9 Baseline creatinine of 1.5-2 in the last 4 to 5 months  Elevated free kappa and lambda obtained in September 2024 Given elevated protein gap as well as rapidly worsening renal function in the setting of anemia, this raises high suspicion for multiple myeloma Nephrology on board Status post bone marrow biopsy 10/30 Status post inpatient PET scan 10/30 Plan: Kidney function continues to improve.  Creatinine downtrending.  Continue Foley.  Continue IV bicarbonate.  Daily renal function.  Kidney biopsy tomorrow.  N.p.o. after midnight.  Normocytic anemia likely 2 secondary to anemia of chronic disease No indication for blood transfusion at this time Monitor CBC closely Transfuse as needed hemoglobin less than 7   Hyperkalemia in the setting of worsening renal failure Patient received hyperkalemia cocktail in the emergency room Potassium has  improved Trend daily renal function   Mild leukocytosis Downtrending No clear evidence of infection Continue to monitor closely No indication for antibiotics at this time   Severe metabolic acidosis secondary to acute renal failure Renal tubular acidosis type IV Continue IV bicarbonate Monitor BMP closely   Hyponatremia Suspect pseudohyponatremia in the setting of plasma cell dyscrasia As evidenced by hyperosmolarity Plan: Continue IV bicarbonate infusion Avoid saline infusion  Hyperlipidemia PTA statin   Hypertension Hold BP meds at this time (home medications include Imdur metoprolol)   History of bladder cancer History of prostate cancer Outpatient follow-up with urology   Coronary artery disease status post CABG Continue statin therapy and aspirin    DVT prophylaxis: SCDs Code Status: Full Family Communication: Spouse at bedside 10/30, 10/31, 11/1, 11/2, 11/3 Disposition Plan: Status is: Inpatient Remains inpatient appropriate because: Severe acute renal failure in the setting of suspected plasma cell dyscrasia versus postobstructive uropathy   Level of care: Telemetry Medical  Consultants:  Nephrology Hematology oncology  Procedures:  Pulmonary biopsy 10/30 CT PET scan 10/30  Antimicrobials: None   Subjective: Examined.  Sitting up in chair.  Wife at bedside.  Patient motivated.  Wants to get up and walk.  Objective: Vitals:   12/20/22 1710 12/20/22 2140 12/21/22 0549 12/21/22 0910  BP: (!) 94/54 (!) 96/56 (!) 116/55 (!) 88/55  Pulse:  92 75 90  Resp:  17 18 16   Temp:  98.2 F (36.8 C) 97.7 F (36.5 C) 97.7 F (36.5 C)  TempSrc:  Oral Oral   SpO2:  100% 99% 99%  Weight:      Height:  Intake/Output Summary (Last 24 hours) at 12/21/2022 1145 Last data filed at 12/21/2022 1120 Gross per 24 hour  Intake 0 ml  Output 2700 ml  Net -2700 ml   Filed Weights   12/16/22 1900 12/17/22 0826  Weight: 63.8 kg 63.8 kg     Examination:  General exam: No acute distress Respiratory system: Lungs clear.  Normal work of breathing.  Room air Cardiovascular system: S1-S2, RRR, no murmurs, no pedal edema Gastrointestinal system: Soft, NT/ND, normal bowel sounds GU: Foley Central nervous system: Awake.  Oriented x 3.  No focal deficits Extremities: Symmetric 5 x 5 power. Skin: No rashes, lesions or ulcers Psychiatry: Judgement and insight appear normal. Mood & affect appropriate.     Data Reviewed: I have personally reviewed following labs and imaging studies  CBC: Recent Labs  Lab 12/17/22 0436 12/18/22 0819 12/19/22 0525 12/20/22 0352 12/21/22 0341  WBC 23.9* 10.7* 9.8 9.0 8.9  NEUTROABS 22.4* 9.1* 7.7 7.0 6.5  HGB 7.4* 7.4* 7.0* 7.2* 7.7*  HCT 21.3* 21.2* 20.9* 21.8* 23.3*  MCV 94.7 92.2 95.4 96.5 96.7  PLT 258 253 225 210 225   Basic Metabolic Panel: Recent Labs  Lab 12/16/22 1513 12/17/22 0436 12/18/22 0819 12/19/22 0525 12/20/22 0352 12/21/22 0341  NA 121* 125* 129* 130* 133* 134*  K 5.7* 4.4 4.0 4.2 3.9 4.2  CL 99 101 100 97* 96* 95*  CO2 12* 13* 18* 23 28 31   GLUCOSE 136* 143* 119* 123* 137* 125*  BUN 133* 132* 108* 103* 97* 90*  CREATININE 9.17* 8.43* 6.68* 6.14* 5.30* 4.60*  CALCIUM 8.7* 8.0* 8.0* 7.9* 8.1* 8.2*  MG 3.2*  --   --   --   --   --    GFR: Estimated Creatinine Clearance: 11.9 mL/min (A) (by C-G formula based on SCr of 4.6 mg/dL (H)). Liver Function Tests: Recent Labs  Lab 12/16/22 1227 12/16/22 1513  AST 8* 9*  ALT 13 12  ALKPHOS 67 70  BILITOT 0.5 0.7  PROT 8.9* 8.7*  ALBUMIN 3.5 3.2*   No results for input(s): "LIPASE", "AMYLASE" in the last 168 hours. No results for input(s): "AMMONIA" in the last 168 hours. Coagulation Profile: Recent Labs  Lab 12/20/22 0352  INR 1.2   Cardiac Enzymes: No results for input(s): "CKTOTAL", "CKMB", "CKMBINDEX", "TROPONINI" in the last 168 hours. BNP (last 3 results) No results for input(s): "PROBNP" in the  last 8760 hours. HbA1C: No results for input(s): "HGBA1C" in the last 72 hours. CBG: Recent Labs  Lab 12/17/22 1147  GLUCAP 97   Lipid Profile: No results for input(s): "CHOL", "HDL", "LDLCALC", "TRIG", "CHOLHDL", "LDLDIRECT" in the last 72 hours. Thyroid Function Tests: No results for input(s): "TSH", "T4TOTAL", "FREET4", "T3FREE", "THYROIDAB" in the last 72 hours.  Anemia Panel: No results for input(s): "VITAMINB12", "FOLATE", "FERRITIN", "TIBC", "IRON", "RETICCTPCT" in the last 72 hours.  Sepsis Labs: Recent Labs  Lab 12/17/22 0436  PROCALCITON 3.60    No results found for this or any previous visit (from the past 240 hour(s)).       Radiology Studies: DG Abd 1 View  Result Date: 12/20/2022 CLINICAL DATA:  Constipation EXAM: ABDOMEN - 1 VIEW COMPARISON:  None Available. FINDINGS: Nonobstructive bowel gas pattern. Small stool burden. No gross free intraperitoneal gas. No organomegaly. Vascular calcifications are seen within the abdomen and pelvis. Osseous structures are age-appropriate. No acute bone abnormality IMPRESSION: 1. Nonobstructive bowel gas pattern.  Small stool burden. Electronically Signed   By: Helyn Numbers  M.D.   On: 12/20/2022 17:16        Scheduled Meds:  atorvastatin  10 mg Oral Daily   Chlorhexidine Gluconate Cloth  6 each Topical Daily   feeding supplement  237 mL Oral BID BM   loratadine  10 mg Oral Daily   multivitamin with minerals  1 tablet Oral Daily   senna-docusate  1 tablet Oral BID   simethicone  80 mg Oral QID   Continuous Infusions:  sodium bicarbonate 150 mEq in dextrose 5 % 1,150 mL infusion 75 mL/hr at 12/21/22 0647     LOS: 5 days     Tresa Moore, MD [facility]   If 7PM-7AM, please contact night-coverage  12/21/2022, 11:45 AM

## 2022-12-21 NOTE — Progress Notes (Signed)
[facility]  PROGRESS NOTE   Subjective:   Feels much better.  Ambulating well with physical therapy today. Urine output is improving.  Objective:  Vital signs: Blood pressure 99/60, pulse 93, temperature 97.7 F (36.5 C), resp. rate 16, height 5\' 6"  (1.676 m), weight 63.8 kg, SpO2 100%.  Intake/Output Summary (Last 24 hours) at 12/21/2022 1540 Last data filed at 12/21/2022 1120 Gross per 24 hour  Intake 0 ml  Output 2100 ml  Net -2100 ml   Filed Weights   12/16/22 1900 12/17/22 0826  Weight: 63.8 kg 63.8 kg     Physical Exam: General:  No acute distress  Head:  Normocephalic, atraumatic. Moist oral mucosal membranes  Eyes:  Anicteric  Neck:  Supple  Lungs:   Clear to auscultation, normal effort  Heart:  S1S2 no rubs  Abdomen:   Soft, nontender, bowel sounds present  Extremities:  peripheral edema.  Neurologic:  Awake, alert, following commands  Skin:  No lesions  Access:     Basic Metabolic Panel: Recent Labs  Lab 12/16/22 1513 12/17/22 0436 12/18/22 0819 12/19/22 0525 12/20/22 0352 12/21/22 0341  NA 121* 125* 129* 130* 133* 134*  K 5.7* 4.4 4.0 4.2 3.9 4.2  CL 99 101 100 97* 96* 95*  CO2 12* 13* 18* 23 28 31   GLUCOSE 136* 143* 119* 123* 137* 125*  BUN 133* 132* 108* 103* 97* 90*  CREATININE 9.17* 8.43* 6.68* 6.14* 5.30* 4.60*  CALCIUM 8.7* 8.0* 8.0* 7.9* 8.1* 8.2*  MG 3.2*  --   --   --   --   --    GFR: Estimated Creatinine Clearance: 11.9 mL/min (A) (by C-G formula based on SCr of 4.6 mg/dL (H)).  Liver Function Tests: Recent Labs  Lab 12/16/22 1227 12/16/22 1513  AST 8* 9*  ALT 13 12  ALKPHOS 67 70  BILITOT 0.5 0.7  PROT 8.9* 8.7*  ALBUMIN 3.5 3.2*   No results for input(s): "LIPASE", "AMYLASE" in the last 168 hours. No results for input(s): "AMMONIA" in the last 168 hours.  CBC: Recent Labs  Lab 12/17/22 0436 12/18/22 0819 12/19/22 0525 12/20/22 0352 12/21/22 0341  WBC 23.9* 10.7* 9.8 9.0 8.9  NEUTROABS 22.4* 9.1*  7.7 7.0 6.5  HGB 7.4* 7.4* 7.0* 7.2* 7.7*  HCT 21.3* 21.2* 20.9* 21.8* 23.3*  MCV 94.7 92.2 95.4 96.5 96.7  PLT 258 253 225 210 225     HbA1C: No results found for: "HGBA1C"  Urinalysis: No results for input(s): "COLORURINE", "LABSPEC", "PHURINE", "GLUCOSEU", "HGBUR", "BILIRUBINUR", "KETONESUR", "PROTEINUR", "UROBILINOGEN", "NITRITE", "LEUKOCYTESUR" in the last 72 hours.  Invalid input(s): "APPERANCEUR"    Imaging: DG Abd 1 View  Result Date: 12/20/2022 CLINICAL DATA:  Constipation EXAM: ABDOMEN - 1 VIEW COMPARISON:  None Available. FINDINGS: Nonobstructive bowel gas pattern. Small stool burden. No gross free intraperitoneal gas. No organomegaly. Vascular calcifications are seen within the abdomen and pelvis. Osseous structures are age-appropriate. No acute bone abnormality IMPRESSION: 1. Nonobstructive bowel gas pattern.  Small stool burden. Electronically Signed   By: Helyn Numbers M.D.   On: 12/20/2022 17:16     Medications:    sodium bicarbonate 150 mEq in dextrose 5 % 1,150 mL infusion 75 mL/hr at 12/21/22 1610    atorvastatin  10 mg Oral Daily   Chlorhexidine Gluconate Cloth  6 each Topical Daily   feeding supplement  237 mL Oral BID BM   loratadine  10 mg Oral Daily   multivitamin with minerals  1 tablet Oral  Daily   senna-docusate  1 tablet Oral BID   simethicone  80 mg Oral QID    Assessment/ Plan:     77 year old white male with history of hypertension, hyperlipidemia, coronary artery disease, status post CABG, history of bladder cancer and also prostate cancer s/p radiation treatments, chronic kidney disease and a diagnosis of multiple myeloma now admitted with history of acute kidney injury.   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  Agree to continue the bicarbonate drip.  Monitor urine output closely.  Patient is scheduled for renal biopsy on  Monday.   #2: Anemia: Anemia is most likely secondary to multiple myeloma.  Heme-onc is on the case.   #3: Metabolic acidosis: Patient has been on bicarbonate drip.  Will stop the drip today.  If need be we will start him on isotonic saline in AM.    Spoke to the patient and his wife at bedside in detail.  Labs and medications reviewed. Will continue to follow along with you.   LOS: 5 Lorain Childes, MD [facility] 11/3/20243:40 PM

## 2022-12-22 ENCOUNTER — Encounter: Payer: Medicare Other | Admitting: Physician Assistant

## 2022-12-22 DIAGNOSIS — N179 Acute kidney failure, unspecified: Secondary | ICD-10-CM | POA: Diagnosis not present

## 2022-12-22 LAB — CBC WITH DIFFERENTIAL/PLATELET
Abs Immature Granulocytes: 0.06 10*3/uL (ref 0.00–0.07)
Basophils Absolute: 0 10*3/uL (ref 0.0–0.1)
Basophils Relative: 0 %
Eosinophils Absolute: 0.5 10*3/uL (ref 0.0–0.5)
Eosinophils Relative: 5 %
HCT: 23.3 % — ABNORMAL LOW (ref 39.0–52.0)
Hemoglobin: 7.6 g/dL — ABNORMAL LOW (ref 13.0–17.0)
Immature Granulocytes: 1 %
Lymphocytes Relative: 11 %
Lymphs Abs: 1.1 10*3/uL (ref 0.7–4.0)
MCH: 32.1 pg (ref 26.0–34.0)
MCHC: 32.6 g/dL (ref 30.0–36.0)
MCV: 98.3 fL (ref 80.0–100.0)
Monocytes Absolute: 1 10*3/uL (ref 0.1–1.0)
Monocytes Relative: 11 %
Neutro Abs: 6.5 10*3/uL (ref 1.7–7.7)
Neutrophils Relative %: 72 %
Platelets: 238 10*3/uL (ref 150–400)
RBC: 2.37 MIL/uL — ABNORMAL LOW (ref 4.22–5.81)
RDW: 13.2 % (ref 11.5–15.5)
WBC: 9.2 10*3/uL (ref 4.0–10.5)
nRBC: 0 % (ref 0.0–0.2)

## 2022-12-22 LAB — LIPID PANEL
Cholesterol: 84 mg/dL (ref 0–200)
HDL: 32 mg/dL — ABNORMAL LOW (ref >40)
LDL Cholesterol: 40 mg/dL (ref 0–99)
Total CHOL/HDL Ratio: 2.6 {ratio}
Triglycerides: 59 mg/dL (ref <150)
VLDL: 12 mg/dL (ref 0–40)

## 2022-12-22 LAB — BASIC METABOLIC PANEL
Anion gap: 10 (ref 5–15)
BUN: 79 mg/dL — ABNORMAL HIGH (ref 8–23)
CO2: 31 mmol/L (ref 22–32)
Calcium: 8.3 mg/dL — ABNORMAL LOW (ref 8.9–10.3)
Chloride: 96 mmol/L — ABNORMAL LOW (ref 98–111)
Creatinine, Ser: 4.08 mg/dL — ABNORMAL HIGH (ref 0.61–1.24)
GFR, Estimated: 14 mL/min — ABNORMAL LOW (ref >60)
Glucose, Bld: 102 mg/dL — ABNORMAL HIGH (ref 70–99)
Potassium: 4.2 mmol/L (ref 3.5–5.1)
Sodium: 137 mmol/L (ref 135–145)

## 2022-12-22 NOTE — Plan of Care (Signed)

## 2022-12-22 NOTE — Progress Notes (Signed)
PROGRESS NOTE    [name]  ZOX:[id number] DOB: March 15, 1945 DOA: 12/16/2022 PCP: [name], MD    Brief Narrative:   77 y.o. male with medical history significant of hypertension, hyperlipidemia, coronary artery disease status post CABG, history of bladder cancer and prostate cancer s/p radiation who follows up with urology closely.  Patient presents to the emergency room today after he was called to come to the emergency room by hematology [name] on account of having worsening renal function and hyperkalemia.  According to patient in August he followed up with his primary care physician and he was found to have worsening anemia and therefore referred to hematology for further management.  Patient saw hematologist today [name] and underwent some blood work and was later called to come to the emergency room on account of the abnormal labs.    Assessment & Plan:   Principal Problem:   AKI (acute kidney injury) (HCC)  Acute renal failure likely in the setting of possible multiple myeloma Patient presented with creatinine of 9 Baseline creatinine of 1.5-2 in the last 4 to 5 months  Elevated free kappa and lambda obtained in September 2024 Given elevated protein gap as well as rapidly worsening renal function in the setting of anemia, this raises high suspicion for multiple myeloma Nephrology on board Status post bone marrow biopsy 10/30 Status post inpatient PET scan 10/30 Plan: Kidney function continues to improve.  Creatinine downtrending daily.  Mental status intact.  Continue Foley catheter.  Defer decision regarding IV bicarbonate to nephrology.  Continue daily renal function.  Unfortunately IR not willing to do kidney biopsy at this time given presence of chronic bilateral hydronephrosis noted on imaging.  Oncology and urology to follow-up.  Normocytic anemia likely 2 secondary to anemia of chronic disease No indication for blood transfusion at this time Monitor CBC  closely Transfuse as needed hemoglobin less than 7   Hyperkalemia in the setting of worsening renal failure Patient received hyperkalemia cocktail in the emergency room Potassium has improved Trend daily renal function   Mild leukocytosis Downtrending No clear evidence of infection Continue to monitor closely No indication for antibiotics at this time   Severe metabolic acidosis secondary to acute renal failure Renal tubular acidosis type IV Continue IV bicarbonate for now pending further nephrology recommendations Monitor BMP closely   Hyponatremia Suspect pseudohyponatremia in the setting of plasma cell dyscrasia As evidenced by hyperosmolarity Plan: IV bicarbonate for now.  Nephrology follow-up Hyperlipidemia PTA statin   Hypertension Hold BP meds at this time (home medications include Imdur metoprolol)   History of bladder cancer History of prostate cancer Outpatient follow-up with urology   Coronary artery disease status post CABG Continue statin therapy and aspirin    DVT prophylaxis: SCDs Code Status: Full Family Communication: Spouse at bedside 10/30, 10/31, 11/1, 11/2, 11/3, 11/4 Disposition Plan: Status is: Inpatient Remains inpatient appropriate because: Severe acute renal failure in the setting of suspected plasma cell dyscrasia versus postobstructive uropathy   Level of care: Telemetry Medical  Consultants:  Nephrology Hematology oncology  Procedures:  Pulmonary biopsy 10/30 CT PET scan 10/30  Antimicrobials: None   Subjective: Seen and examined.  Lying in bed.  No visible distress.  Mentation intact.  Objective: Vitals:   12/21/22 1955 12/22/22 0319 12/22/22 0321 12/22/22 1048  BP: (!) 100/54 (!) 94/51 116/66 (!) 99/58  Pulse: 85 82 79 76  Resp: 17 17  18   Temp: 98.6 F (37 C) 98.1 F (36.7 C)  97.7 F (36.5 C)  TempSrc: Oral Oral    SpO2: 99% 95%  97%  Weight:      Height:        Intake/Output Summary (Last 24 hours) at  12/22/2022 1255 Last data filed at 12/22/2022 0835 Gross per 24 hour  Intake --  Output 2500 ml  Net -2500 ml   Filed Weights   12/16/22 1900 12/17/22 0826  Weight: 63.8 kg 63.8 kg    Examination:  General exam: NAD Respiratory system: Lungs clear.  Normal work of breathing.  Room air Cardiovascular system: S1-S2, RRR, no murmurs, no pedal edema Gastrointestinal system: Soft, NT/ND, normal bowel sounds GU: Foley Central nervous system: Awake.  Oriented x 3.  No focal deficits Extremities: Symmetric 5 x 5 power. Skin: No rashes, lesions or ulcers Psychiatry: Judgement and insight appear normal. Mood & affect appropriate.     Data Reviewed: I have personally reviewed following labs and imaging studies  CBC: Recent Labs  Lab 12/18/22 0819 12/19/22 0525 12/20/22 0352 12/21/22 0341 12/22/22 0413  WBC 10.7* 9.8 9.0 8.9 9.2  NEUTROABS 9.1* 7.7 7.0 6.5 6.5  HGB 7.4* 7.0* 7.2* 7.7* 7.6*  HCT 21.2* 20.9* 21.8* 23.3* 23.3*  MCV 92.2 95.4 96.5 96.7 98.3  PLT 253 225 210 225 238   Basic Metabolic Panel: Recent Labs  Lab 12/16/22 1513 12/17/22 0436 12/18/22 0819 12/19/22 0525 12/20/22 0352 12/21/22 0341 12/22/22 0413  NA 121*   < > 129* 130* 133* 134* 137  K 5.7*   < > 4.0 4.2 3.9 4.2 4.2  CL 99   < > 100 97* 96* 95* 96*  CO2 12*   < > 18* 23 28 31 31   GLUCOSE 136*   < > 119* 123* 137* 125* 102*  BUN 133*   < > 108* 103* 97* 90* 79*  CREATININE 9.17*   < > 6.68* 6.14* 5.30* 4.60* 4.08*  CALCIUM 8.7*   < > 8.0* 7.9* 8.1* 8.2* 8.3*  MG 3.2*  --   --   --   --   --   --    < > = values in this interval not displayed.   GFR: Estimated Creatinine Clearance: 13.5 mL/min (A) (by C-G formula based on SCr of 4.08 mg/dL (H)). Liver Function Tests: Recent Labs  Lab 12/16/22 1227 12/16/22 1513  AST 8* 9*  ALT 13 12  ALKPHOS 67 70  BILITOT 0.5 0.7  PROT 8.9* 8.7*  ALBUMIN 3.5 3.2*   No results for input(s): "LIPASE", "AMYLASE" in the last 168 hours. No results for  input(s): "AMMONIA" in the last 168 hours. Coagulation Profile: Recent Labs  Lab 12/20/22 0352  INR 1.2   Cardiac Enzymes: No results for input(s): "CKTOTAL", "CKMB", "CKMBINDEX", "TROPONINI" in the last 168 hours. BNP (last 3 results) No results for input(s): "PROBNP" in the last 8760 hours. HbA1C: No results for input(s): "HGBA1C" in the last 72 hours. CBG: Recent Labs  Lab 12/17/22 1147  GLUCAP 97   Lipid Profile: Recent Labs    12/22/22 0413  CHOL 84  HDL 32*  LDLCALC 40  TRIG 59  CHOLHDL 2.6   Thyroid Function Tests: No results for input(s): "TSH", "T4TOTAL", "FREET4", "T3FREE", "THYROIDAB" in the last 72 hours.  Anemia Panel: No results for input(s): "VITAMINB12", "FOLATE", "FERRITIN", "TIBC", "IRON", "RETICCTPCT" in the last 72 hours.  Sepsis Labs: Recent Labs  Lab 12/17/22 0436  PROCALCITON 3.60    No results found for this or any previous  visit (from the past 240 hour(s)).       Radiology Studies: DG Abd 1 View  Result Date: 12/20/2022 CLINICAL DATA:  Constipation EXAM: ABDOMEN - 1 VIEW COMPARISON:  None Available. FINDINGS: Nonobstructive bowel gas pattern. Small stool burden. No gross free intraperitoneal gas. No organomegaly. Vascular calcifications are seen within the abdomen and pelvis. Osseous structures are age-appropriate. No acute bone abnormality IMPRESSION: 1. Nonobstructive bowel gas pattern.  Small stool burden. Electronically Signed   By: Helyn Numbers M.D.   On: 12/20/2022 17:16        Scheduled Meds:  atorvastatin  10 mg Oral Daily   Chlorhexidine Gluconate Cloth  6 each Topical Daily   feeding supplement  237 mL Oral BID BM   loratadine  10 mg Oral Daily   multivitamin with minerals  1 tablet Oral Daily   senna-docusate  1 tablet Oral BID   simethicone  80 mg Oral QID   Continuous Infusions:     LOS: 6 days     Tresa Moore, MD [facility]   If 7PM-7AM, please contact night-coverage  12/22/2022,  12:55 PM

## 2022-12-22 NOTE — Progress Notes (Signed)
Occupational Therapy Treatment Patient Details Name: [name] MRN: [id number] DOB: [birth year] Today's Date: 12/22/2022   History of present illness 77 y.o. male with medical history significant of hypertension, hyperlipidemia, coronary artery disease status post CABG, history of bladder cancer and prostate cancer s/p radiation who follows up with urology closely.   OT comments  Pt seen for OT treatment on this date. Upon arrival to room pt supine in bed, agreeable to tx. Pt completed supine<>sit t/f with supervision. Pt completed sit<>stand t/f with supervision +RW. Pt ambulated to the bathroom with CGA +RW. Pt completed oral care and washing face at the sink in standing. NT completed CHG bath in standing while pt in bathroom. Pt requested needing to use bathroom. Pt completed a toilet t/f, clothing management and hygiene with supervision and set up. Pt ambulated back to bedside and returned to bed. Pt left supine in bed with call bell within reach and all needs met. Pt making good progress toward goals, will continue to follow POC. Discharge recommendation remains appropriate.        If plan is discharge home, recommend the following:  A little help with walking and/or transfers;Assist for transportation;Assistance with cooking/housework;Direct supervision/assist for medications management;Help with stairs or ramp for entrance   Equipment Recommendations  BSC/3in1;Other (comment)    Recommendations for Other Services      Precautions / Restrictions Precautions Precautions: Fall Restrictions Weight Bearing Restrictions: No       Mobility Bed Mobility Overal bed mobility: Needs Assistance Bed Mobility: Supine to Sit, Sit to Supine   Sidelying to sit: Supervision Supine to sit: Supervision       Patient Response: Cooperative  Transfers Overall transfer level: Needs assistance Equipment used: Rolling walker (2 wheels) Transfers: Sit to/from Stand Sit to Stand:  Supervision                 Balance Overall balance assessment: Needs assistance Sitting-balance support: No upper extremity supported, Feet supported Sitting balance-Leahy Scale: Good     Standing balance support: Bilateral upper extremity supported, During functional activity, Reliant on assistive device for balance Standing balance-Leahy Scale: Good                             ADL either performed or assessed with clinical judgement   ADL                                       Functional mobility during ADLs: Supervision/safety;Set up;Contact guard assist;Rolling walker (2 wheels) General ADL Comments: Pt ambulated to the bathroom with CGA +RW. Pt completed oral care and washing face at the sink in standing. Pt requested needing to use bathroom and completed a toilet t/f, clothing management and hygiene with supervision and set up.    Extremity/Trunk Assessment Upper Extremity Assessment Upper Extremity Assessment: Overall WFL for tasks assessed   Lower Extremity Assessment Lower Extremity Assessment: Defer to PT evaluation   Cervical / Trunk Assessment Cervical / Trunk Assessment: Normal    Vision Baseline Vision/History: 1 Wears glasses Patient Visual Report: No change from baseline     Perception     Praxis      Cognition Arousal: Alert Behavior During Therapy: WFL for tasks assessed/performed Overall Cognitive Status: Within Functional Limits for tasks assessed  Exercises      Shoulder Instructions       General Comments      Pertinent Vitals/ Pain       Pain Assessment Pain Assessment: No/denies pain  Home Living                                          Prior Functioning/Environment              Frequency  Min 1X/week        Progress Toward Goals  OT Goals(current goals can now be found in the care plan section)  Progress  towards OT goals: Progressing toward goals     Plan      Co-evaluation                 AM-PAC OT "6 Clicks" Daily Activity     Outcome Measure   Help from another person eating meals?: A Little Help from another person taking care of personal grooming?: A Little Help from another person toileting, which includes using toliet, bedpan, or urinal?: A Little Help from another person bathing (including washing, rinsing, drying)?: A Lot Help from another person to put on and taking off regular upper body clothing?: None Help from another person to put on and taking off regular lower body clothing?: A Little 6 Click Score: 18    End of Session Equipment Utilized During Treatment: Rolling walker (2 wheels)  OT Visit Diagnosis: Other abnormalities of gait and mobility (R26.89);Muscle weakness (generalized) (M62.81)   Activity Tolerance Patient tolerated treatment well   Patient Left in bed;with bed alarm set;with call bell/phone within reach   Nurse Communication          Time: 0347-4259 OT Time Calculation (min): 18 min  Charges:    Butch Penny, SOT

## 2022-12-22 NOTE — Progress Notes (Signed)
[facility]  ROUNDING NOTE   Subjective:   Pateint was last seen in nephrology office on 9/12 for work up for worsening renal function and nephrotic range proteinuria and hematuria.  Patient was seen ambulating in hall with wife Currently sitting in chair Appetite slowly improving Remains on room air with no lower extremity edema  Creatinine 4.08 Urine output 2 L  Objective:  Vital signs in last 24 hours:  Temp:  [97.7 F (36.5 C)-98.6 F (37 C)] 97.7 F (36.5 C) (11/04 1048) Pulse Rate:  [76-85] 76 (11/04 1048) Resp:  [17-18] 18 (11/04 1048) BP: (94-116)/(51-66) 99/58 (11/04 1048) SpO2:  [95 %-99 %] 97 % (11/04 1048)  Weight change:  Filed Weights   12/16/22 1900 12/17/22 0826  Weight: 63.8 kg 63.8 kg    Intake/Output: I/O last 3 completed shifts: In: 0  Out: 3800 [Urine:3800]   Intake/Output this shift:  Total I/O In: -  Out: 800 [Urine:800]  Physical Exam: General: NAD  Head: Normocephalic, atraumatic. Moist oral mucosal membranes  Eyes: Anicteric  Lungs:  Clear to auscultation, normal effort  Heart: Regular rate and rhythm  Abdomen:  Soft, nontender  Extremities:  no peripheral edema.  Neurologic: Alert and oriented  Skin: No lesions  Access: none    Basic Metabolic Panel: Recent Labs  Lab 12/16/22 1513 12/17/22 0436 12/18/22 0819 12/19/22 0525 12/20/22 0352 12/21/22 0341 12/22/22 0413  NA 121*   < > 129* 130* 133* 134* 137  K 5.7*   < > 4.0 4.2 3.9 4.2 4.2  CL 99   < > 100 97* 96* 95* 96*  CO2 12*   < > 18* 23 28 31 31   GLUCOSE 136*   < > 119* 123* 137* 125* 102*  BUN 133*   < > 108* 103* 97* 90* 79*  CREATININE 9.17*   < > 6.68* 6.14* 5.30* 4.60* 4.08*  CALCIUM 8.7*   < > 8.0* 7.9* 8.1* 8.2* 8.3*  MG 3.2*  --   --   --   --   --   --    < > = values in this interval not displayed.    Liver Function Tests: Recent Labs  Lab 12/16/22 1227 12/16/22 1513  AST 8* 9*  ALT 13 12  ALKPHOS 67 70  BILITOT 0.5 0.7  PROT  8.9* 8.7*  ALBUMIN 3.5 3.2*   No results for input(s): "LIPASE", "AMYLASE" in the last 168 hours. No results for input(s): "AMMONIA" in the last 168 hours.  CBC: Recent Labs  Lab 12/18/22 0819 12/19/22 0525 12/20/22 0352 12/21/22 0341 12/22/22 0413  WBC 10.7* 9.8 9.0 8.9 9.2  NEUTROABS 9.1* 7.7 7.0 6.5 6.5  HGB 7.4* 7.0* 7.2* 7.7* 7.6*  HCT 21.2* 20.9* 21.8* 23.3* 23.3*  MCV 92.2 95.4 96.5 96.7 98.3  PLT 253 225 210 225 238    Cardiac Enzymes: No results for input(s): "CKTOTAL", "CKMB", "CKMBINDEX", "TROPONINI" in the last 168 hours.  BNP: Invalid input(s): "POCBNP"  CBG: Recent Labs  Lab 12/17/22 1147  GLUCAP 97    Microbiology: Results for orders placed or performed in visit on 10/01/22  Microscopic Examination     Status: Abnormal   Collection Time: 10/01/22  2:14 PM   Urine  Result Value Ref Range Status   WBC, UA >30 (A) 0 - 5 /hpf Final   RBC, Urine 11-30 (A) 0 - 2 /hpf Final   Epithelial Cells (non renal) 0-10 0 - 10 /hpf Final   Bacteria,  UA Moderate (A) None seen/Few Final  CULTURE, URINE COMPREHENSIVE     Status: Abnormal   Collection Time: 10/01/22  3:33 PM   Specimen: Urine   UR  Result Value Ref Range Status   Urine Culture, Comprehensive Final report (A)  Final   Organism ID, Bacteria Escherichia coli (A)  Final    Comment: Cefazolin <=4 ug/mL Cefazolin with an MIC <=16 predicts susceptibility to the oral agents cefaclor, cefdinir, cefpodoxime, cefprozil, cefuroxime, cephalexin, and loracarbef when used for therapy of uncomplicated urinary tract infections due to E. coli, Klebsiella pneumoniae, and Proteus mirabilis. 50,000-100,000 colony forming units per mL    ANTIMICROBIAL SUSCEPTIBILITY Comment  Final    Comment:       ** S = Susceptible; I = Intermediate; R = Resistant **                    P = Positive; N = Negative             MICS are expressed in micrograms per mL    Antibiotic                 RSLT#1    RSLT#2    RSLT#3     RSLT#4 Amoxicillin/Clavulanic Acid    S Ampicillin                     S Cefepime                       S Ceftriaxone                    S Cefuroxime                     S Ciprofloxacin                  S Ertapenem                      S Gentamicin                     R Imipenem                       S Levofloxacin                   I Meropenem                      S Nitrofurantoin                 S Piperacillin/Tazobactam        S Tetracycline                   R Tobramycin                     I Trimethoprim/Sulfa             R     Coagulation Studies: Recent Labs    12/20/22 0352  LABPROT 15.2  INR 1.2    Urinalysis: No results for input(s): "COLORURINE", "LABSPEC", "PHURINE", "GLUCOSEU", "HGBUR", "BILIRUBINUR", "KETONESUR", "PROTEINUR", "UROBILINOGEN", "NITRITE", "LEUKOCYTESUR" in the last 72 hours.  Invalid input(s): "APPERANCEUR"     Imaging: No results found.   Medications:      atorvastatin  10 mg Oral Daily   Chlorhexidine Gluconate Cloth  6 each  Topical Daily   feeding supplement  237 mL Oral BID BM   loratadine  10 mg Oral Daily   multivitamin with minerals  1 tablet Oral Daily   senna-docusate  1 tablet Oral BID   simethicone  80 mg Oral QID   fluticasone, melatonin, morphine injection  Assessment/ Plan:  Mr. WOODARD PERRELL is a 77 y.o.  male with hypertension, AAA, hyperlipidemia, coronary artery disease status post CABG, bladder cancer, glaucoma who [facility] with Hyperkalemia [E87.5] Hyponatremia [E87.1] AKI (acute kidney injury) (HCC) [N17.9] Anemia, unspecified type [D64.9] Acute renal failure superimposed on chronic kidney disease, unspecified acute renal failure type, unspecified CKD stage (HCC) [N17.9, N18.9]  Acute Kidney Injury with hyperkalemia on Chronic kidney disease stage IV with proteinuria: concern for underlying multiple myeloma and cast nephropathy -Renal function continues to improve with  adequate urine output noted. - Foley catheter remains in place, urology following -No acute indication for dialysis  -Renal biopsy deferred due to chronic bilateral hydronephrosis.  Risk outweigh benefits at this time.  -With continued improvements with renal function, we will continue to follow patient in office.  Hyponatremia: hyperosmolar consistent with pseudohyponatremia from plasma cell proteins.  - Sodium 137  Acute metabolic acidosis: nonanion gap: consistent with renal tubular acidosis type IV.  -Corrected  Anemia with chronic kidney disease: with leukocytosis. Concerning for multiple myeloma or other blood cell dyscrasia. Appreciate hematology following    LOS: 6 Vernis Eid 11/4/20243:34 PM

## 2022-12-23 ENCOUNTER — Other Ambulatory Visit: Payer: Self-pay | Admitting: *Deleted

## 2022-12-23 ENCOUNTER — Encounter: Payer: Medicare Other | Admitting: Physician Assistant

## 2022-12-23 DIAGNOSIS — R7989 Other specified abnormal findings of blood chemistry: Secondary | ICD-10-CM

## 2022-12-23 DIAGNOSIS — R768 Other specified abnormal immunological findings in serum: Secondary | ICD-10-CM

## 2022-12-23 DIAGNOSIS — C679 Malignant neoplasm of bladder, unspecified: Secondary | ICD-10-CM

## 2022-12-23 DIAGNOSIS — N179 Acute kidney failure, unspecified: Secondary | ICD-10-CM | POA: Diagnosis not present

## 2022-12-23 LAB — BASIC METABOLIC PANEL
Anion gap: 9 (ref 5–15)
BUN: 77 mg/dL — ABNORMAL HIGH (ref 8–23)
CO2: 28 mmol/L (ref 22–32)
Calcium: 8.4 mg/dL — ABNORMAL LOW (ref 8.9–10.3)
Chloride: 98 mmol/L (ref 98–111)
Creatinine, Ser: 3.71 mg/dL — ABNORMAL HIGH (ref 0.61–1.24)
GFR, Estimated: 16 mL/min — ABNORMAL LOW (ref >60)
Glucose, Bld: 117 mg/dL — ABNORMAL HIGH (ref 70–99)
Potassium: 3.9 mmol/L (ref 3.5–5.1)
Sodium: 135 mmol/L (ref 135–145)

## 2022-12-23 LAB — CBC WITH DIFFERENTIAL/PLATELET
Abs Immature Granulocytes: 0.05 10*3/uL (ref 0.00–0.07)
Basophils Absolute: 0.1 10*3/uL (ref 0.0–0.1)
Basophils Relative: 1 %
Eosinophils Absolute: 0.5 10*3/uL (ref 0.0–0.5)
Eosinophils Relative: 6 %
HCT: 21.8 % — ABNORMAL LOW (ref 39.0–52.0)
Hemoglobin: 7.1 g/dL — ABNORMAL LOW (ref 13.0–17.0)
Immature Granulocytes: 1 %
Lymphocytes Relative: 13 %
Lymphs Abs: 1.1 10*3/uL (ref 0.7–4.0)
MCH: 32.3 pg (ref 26.0–34.0)
MCHC: 32.6 g/dL (ref 30.0–36.0)
MCV: 99.1 fL (ref 80.0–100.0)
Monocytes Absolute: 0.8 10*3/uL (ref 0.1–1.0)
Monocytes Relative: 9 %
Neutro Abs: 6.3 10*3/uL (ref 1.7–7.7)
Neutrophils Relative %: 70 %
Platelets: 226 10*3/uL (ref 150–400)
RBC: 2.2 MIL/uL — ABNORMAL LOW (ref 4.22–5.81)
RDW: 13.1 % (ref 11.5–15.5)
WBC: 8.8 10*3/uL (ref 4.0–10.5)
nRBC: 0 % (ref 0.0–0.2)

## 2022-12-23 MED ORDER — ASPIRIN 81 MG PO TBEC
81.0000 mg | DELAYED_RELEASE_TABLET | Freq: Every day | ORAL | Status: DC
  Administered 2022-12-23 – 2022-12-25 (×3): 81 mg via ORAL
  Filled 2022-12-23 (×3): qty 1

## 2022-12-23 MED ORDER — SENNOSIDES-DOCUSATE SODIUM 8.6-50 MG PO TABS: 1.0000 | ORAL_TABLET | Freq: Two times a day (BID) | ORAL | 0 refills | Status: DC | PRN

## 2022-12-23 MED ORDER — SIMETHICONE 80 MG PO CHEW: 80.0000 mg | CHEWABLE_TABLET | Freq: Four times a day (QID) | ORAL | 0 refills | Status: DC | PRN

## 2022-12-23 MED ORDER — SILODOSIN 8 MG PO CAPS
8.0000 mg | ORAL_CAPSULE | Freq: Every day | ORAL | Status: DC
  Administered 2022-12-24: 8 mg via ORAL
  Filled 2022-12-23 (×3): qty 1

## 2022-12-23 MED ORDER — NON FORMULARY: 8.0000 mg | Freq: Every day | Status: DC

## 2022-12-23 NOTE — Progress Notes (Signed)
PROGRESS NOTE    [name]  HQI:[id number] DOB: [birth year] DOA: 12/16/2022 PCP: [name], MD    Brief Narrative:   77 y.o. male with medical history significant of hypertension, hyperlipidemia, coronary artery disease status post CABG, history of bladder cancer and prostate cancer s/p radiation who follows up with urology closely.  Patient presents to the emergency room today after he was called to come to the emergency room by hematology [name] on account of having worsening renal function and hyperkalemia.  According to patient in August he followed up with his primary care physician and he was found to have worsening anemia and therefore referred to hematology for further management.  Patient saw hematologist today [name] and underwent some blood work and was later called to come to the emergency room on account of the abnormal labs.    Assessment & Plan:   Principal Problem:   AKI (acute kidney injury) (HCC)  Acute renal failure likely in the setting of possible multiple myeloma Patient presented with creatinine of 9 Baseline creatinine of 1.5-2 in the last 4 to 5 months  Elevated free kappa and lambda obtained in September 2024 Given elevated protein gap as well as rapidly worsening renal function in the setting of anemia, this raises high suspicion for multiple myeloma Nephrology on board Status post bone marrow biopsy 10/30 Status post inpatient PET scan 10/30 Plan: Kidney function continues to improve.   Creatinine downtrending daily.  Mental status intact.   Patient will discharge with Foley catheter in place.  Urology to set up follow-up in 1 to 2 weeks for outpatient voiding trial.  IV bicarbonate discontinued.  Kidney biopsy is on hold as interventional radiology is unwilling to perform it due to the presence of chronic bilateral hydronephrosis.  Will need outpatient follow-up with oncology and likely referral to tertiary center for further workup of possible  underlying myeloma.  Weakness Functional decline I suspect these are multifactorial in nature.  Patient did well ambulating on flat surface with physical therapy however patient and wife states they have 15 stairs to get up into the residence.  I have reached out to physical therapy and mobility specialist to perform stair training prior to discharge.  Anticipate discharge readiness 11/6.   Normocytic anemia likely 2 secondary to anemia of chronic disease No indication for blood transfusion at this time Monitor CBC closely Transfusion threshold of 7   Hyperkalemia in the setting of worsening renal failure Patient received hyperkalemia cocktail in the emergency room Potassium has improved Trend daily renal function   Mild leukocytosis Downtrending No clear evidence of infection Continue to monitor closely No indication for antibiotics at this time   Severe metabolic acidosis secondary to acute renal failure Renal tubular acidosis type IV IV bicarb has been discontinued.  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  Hyperlipidemia PTA statin   Hypertension Hold BP meds at this time (home medications include Imdur metoprolol)   History of bladder cancer History of prostate cancer Outpatient follow-up with urology Discharge with Foley catheter in place Outpatient voiding trial   Coronary artery disease status post CABG Continue statin and aspirin    DVT prophylaxis: SCDs Code Status: Full Family Communication: Spouse at bedside 10/30, 10/31, 11/1, 11/2, 11/3, 11/4, 11/5 Disposition Plan: Status is: Inpatient Remains inpatient appropriate because: Weakness functional decline.  Safety at home.   Level of  care: Telemetry Medical  Consultants:  Nephrology Hematology oncology  Procedures:  Pulmonary biopsy 10/30 CT PET scan  10/30  Antimicrobials: None   Subjective: Seen and examined.  Sitting up in chair.  No distress.  Mentating clearly.  Objective: Vitals:   12/22/22 1048 12/22/22 1624 12/22/22 2053 12/23/22 0305  BP: (!) 99/58 (!) 99/55 126/61 (!) 111/56  Pulse: 76 87 86 83  Resp: 18 18 17 17   Temp: 97.7 F (36.5 C) 98.3 F (36.8 C) 98.4 F (36.9 C) 97.6 F (36.4 C)  TempSrc:  Oral Oral Oral  SpO2: 97% 100% 97% 95%  Weight:      Height:        Intake/Output Summary (Last 24 hours) at 12/23/2022 1154 Last data filed at 12/22/2022 1800 Gross per 24 hour  Intake --  Output 700 ml  Net -700 ml   Filed Weights   12/16/22 1900 12/17/22 0826  Weight: 63.8 kg 63.8 kg    Examination:  General exam: No acute distress Respiratory system: Lungs clear.  Normal work of breathing.  Room air Cardiovascular system: S1-S2, RRR, no murmurs, no pedal edema Gastrointestinal system: Soft, NT/ND, normal bowel sounds GU: Foley in place Central nervous system: Awake.  Oriented x 3.  No focal deficits Extremities: Symmetric 5 x 5 power. Skin: No rashes, lesions or ulcers Psychiatry: Judgement and insight appear normal. Mood & affect appropriate.     Data Reviewed: I have personally reviewed following labs and imaging studies  CBC: Recent Labs  Lab 12/19/22 0525 12/20/22 0352 12/21/22 0341 12/22/22 0413 12/23/22 0411  WBC 9.8 9.0 8.9 9.2 8.8  NEUTROABS 7.7 7.0 6.5 6.5 6.3  HGB 7.0* 7.2* 7.7* 7.6* 7.1*  HCT 20.9* 21.8* 23.3* 23.3* 21.8*  MCV 95.4 96.5 96.7 98.3 99.1  PLT 225 210 225 238 226   Basic Metabolic Panel: Recent Labs  Lab 12/16/22 1513 12/17/22 0436 12/19/22 0525 12/20/22 0352 12/21/22 0341 12/22/22 0413 12/23/22 0411  NA 121*   < > 130* 133* 134* 137 135  K 5.7*   < > 4.2 3.9 4.2 4.2 3.9  CL 99   < > 97* 96* 95* 96* 98  CO2 12*   < > 23 28 31 31 28   GLUCOSE 136*   < > 123* 137* 125* 102* 117*  BUN 133*   < > 103* 97* 90* 79* 77*  CREATININE 9.17*   < > 6.14* 5.30*  4.60* 4.08* 3.71*  CALCIUM 8.7*   < > 7.9* 8.1* 8.2* 8.3* 8.4*  MG 3.2*  --   --   --   --   --   --    < > = values in this interval not displayed.   GFR: Estimated Creatinine Clearance: 14.8 mL/min (A) (by C-G formula based on SCr of 3.71 mg/dL (H)). Liver Function Tests: Recent Labs  Lab 12/16/22 1513  AST 9*  ALT 12  ALKPHOS 70  BILITOT 0.7  PROT 8.7*  ALBUMIN 3.2*   No results for input(s): "LIPASE", "AMYLASE" in the last 168 hours. No results for input(s): "AMMONIA" in the last 168 hours. Coagulation Profile: Recent Labs  Lab 12/20/22 0352  INR 1.2   Cardiac Enzymes: No results for input(s): "CKTOTAL", "CKMB", "CKMBINDEX", "TROPONINI" in the last 168 hours. BNP (last 3 results) No results for input(s): "PROBNP" in the last 8760 hours. HbA1C: No results for input(s): "HGBA1C" in the last 72 hours. CBG: Recent Labs  Lab 12/17/22 1147  GLUCAP 97   Lipid  Profile: Recent Labs    12/22/22 0413  CHOL 84  HDL 32*  LDLCALC 40  TRIG 59  CHOLHDL 2.6   Thyroid Function Tests: No results for input(s): "TSH", "T4TOTAL", "FREET4", "T3FREE", "THYROIDAB" in the last 72 hours.  Anemia Panel: No results for input(s): "VITAMINB12", "FOLATE", "FERRITIN", "TIBC", "IRON", "RETICCTPCT" in the last 72 hours.  Sepsis Labs: Recent Labs  Lab 12/17/22 0436  PROCALCITON 3.60    No results found for this or any previous visit (from the past 240 hour(s)).       Radiology Studies: No results found.      Scheduled Meds:  atorvastatin  10 mg Oral Daily   Chlorhexidine Gluconate Cloth  6 each Topical Daily   feeding supplement  237 mL Oral BID BM   loratadine  10 mg Oral Daily   multivitamin with minerals  1 tablet Oral Daily   senna-docusate  1 tablet Oral BID   simethicone  80 mg Oral QID   Continuous Infusions:     LOS: 7 days     Tresa Moore, MD [facility]   If 7PM-7AM, please contact night-coverage  12/23/2022, 11:54 AM

## 2022-12-23 NOTE — Progress Notes (Addendum)
[facility]  ROUNDING NOTE   Subjective:   Pateint was last seen in nephrology office on 9/12 for work up for worsening renal function and nephrotic range proteinuria and hematuria.  Patient seen sitting up in chair, partially completed breakfast tray at bedside Wife at bedside Room air No lower extremity edema Wife voices concerns of patient stability and endurance to complete stairs required to enter their home.  Creatinine 4.08 ->>3.71 Urine output 1.5 L on day shift  Objective:  Vital signs in last 24 hours:  Temp:  [97.6 F (36.4 C)-98.4 F (36.9 C)] 97.6 F (36.4 C) (11/05 0305) Pulse Rate:  [83-87] 83 (11/05 0305) Resp:  [17-18] 17 (11/05 0305) BP: (99-126)/(55-61) 111/56 (11/05 0305) SpO2:  [95 %-100 %] 95 % (11/05 0305)  Weight change:  Filed Weights   12/16/22 1900 12/17/22 0826  Weight: 63.8 kg 63.8 kg    Intake/Output: I/O last 3 completed shifts: In: -  Out: 3200 [Urine:3200]   Intake/Output this shift:  No intake/output data recorded.  Physical Exam: General: NAD  Head: Normocephalic, atraumatic. Moist oral mucosal membranes  Eyes: Anicteric  Lungs:  Clear to auscultation, normal effort  Heart: Regular rate and rhythm  Abdomen:  Soft, nontender  Extremities:  no peripheral edema.  Neurologic: Alert and oriented  Skin: No lesions     GU Foley catheter  Basic Metabolic Panel: Recent Labs  Lab 12/16/22 1513 12/17/22 0436 12/19/22 0525 12/20/22 0352 12/21/22 0341 12/22/22 0413 12/23/22 0411  NA 121*   < > 130* 133* 134* 137 135  K 5.7*   < > 4.2 3.9 4.2 4.2 3.9  CL 99   < > 97* 96* 95* 96* 98  CO2 12*   < > 23 28 31 31 28   GLUCOSE 136*   < > 123* 137* 125* 102* 117*  BUN 133*   < > 103* 97* 90* 79* 77*  CREATININE 9.17*   < > 6.14* 5.30* 4.60* 4.08* 3.71*  CALCIUM 8.7*   < > 7.9* 8.1* 8.2* 8.3* 8.4*  MG 3.2*  --   --   --   --   --   --    < > = values in this interval not displayed.    Liver Function Tests: Recent  Labs  Lab 12/16/22 1513  AST 9*  ALT 12  ALKPHOS 70  BILITOT 0.7  PROT 8.7*  ALBUMIN 3.2*   No results for input(s): "LIPASE", "AMYLASE" in the last 168 hours. No results for input(s): "AMMONIA" in the last 168 hours.  CBC: Recent Labs  Lab 12/19/22 0525 12/20/22 0352 12/21/22 0341 12/22/22 0413 12/23/22 0411  WBC 9.8 9.0 8.9 9.2 8.8  NEUTROABS 7.7 7.0 6.5 6.5 6.3  HGB 7.0* 7.2* 7.7* 7.6* 7.1*  HCT 20.9* 21.8* 23.3* 23.3* 21.8*  MCV 95.4 96.5 96.7 98.3 99.1  PLT 225 210 225 238 226    Cardiac Enzymes: No results for input(s): "CKTOTAL", "CKMB", "CKMBINDEX", "TROPONINI" in the last 168 hours.  BNP: Invalid input(s): "POCBNP"  CBG: Recent Labs  Lab 12/17/22 1147  GLUCAP 97      Coagulation Studies: No results for input(s): "LABPROT", "INR" in the last 72 hours.   Urinalysis: No results for input(s): "COLORURINE", "LABSPEC", "PHURINE", "GLUCOSEU", "HGBUR", "BILIRUBINUR", "KETONESUR", "PROTEINUR", "UROBILINOGEN", "NITRITE", "LEUKOCYTESUR" in the last 72 hours.  Invalid input(s): "APPERANCEUR"     Imaging: No results found.   Medications:      atorvastatin  10 mg Oral Daily   Chlorhexidine  Gluconate Cloth  6 each Topical Daily   feeding supplement  237 mL Oral BID BM   loratadine  10 mg Oral Daily   multivitamin with minerals  1 tablet Oral Daily   senna-docusate  1 tablet Oral BID   simethicone  80 mg Oral QID   fluticasone, melatonin, morphine injection  Assessment/ Plan:  Mr. [name] is a 77 y.o.  male with hypertension, AAA, hyperlipidemia, coronary artery disease status post CABG, bladder cancer, glaucoma who [facility] with Hyperkalemia [E87.5] Hyponatremia [E87.1] AKI (acute kidney injury) (HCC) [N17.9] Anemia, unspecified type [D64.9] Acute renal failure superimposed on chronic kidney disease, unspecified acute renal failure type, unspecified CKD stage (HCC) [N17.9, N18.9]  Acute Kidney Injury  with hyperkalemia on Chronic kidney disease stage IV with proteinuria: concern for underlying multiple myeloma and cast nephropathy.  Hydronephrosis noted on renal ultrasound. -Myeloma kidney less likely as serum creatinine has improved significantly after Foley catheter placement. -  urology will follow-up outpatient with voiding trial. -No acute indication for dialysis  -Renal biopsy deferred due to chronic bilateral hydronephrosis.  Risk outweigh benefits at this time.  May consider as outpatient at later date.  -Will schedule patient follow-up in our office at discharge.  Hyponatremia: Possibly from AKI. - Sodium has improved and is now in normal range.  BPH/obstructive uropathy/hydronephrosis  -Start silodosin.  Patient has not tolerated tamsulosin in the past.  Anemia with chronic kidney disease: with leukocytosis. Concerning for multiple myeloma or other blood cell dyscrasia. Appreciate hematology following    LOS: 7 Shantelle Breeze 11/5/202411:58 AM   Patient was seen and examined with Wendee Beavers, NP.  I personally formulated plan of care for the problems addressed and discussed with NP.  I agree with the note as documented except as noted below. I take the responsibility for the inherent risk of patient management.

## 2022-12-23 NOTE — TOC Progression Note (Signed)
Transition of Care [facility]) - Progression Note    Patient Details  Name: [name] MRN: [id number] Date of Birth: [birth year]  [facility] CM/SW Contact  Chapman Fitch, RN Phone Number: 12/23/2022, 10:28 AM  Clinical Narrative:     Attempted to follow up with patient/wife about decision on home health agency.  MD with patient at this time.  TOC to follow up   Expected Discharge Plan: Home w Home Health Services    Expected Discharge Plan and Services                                               Social Determinants of Health (SDOH) Interventions SDOH Screenings   Food Insecurity: No Food Insecurity (12/16/2022)  Housing: Low Risk  (12/16/2022)  Transportation Needs: No Transportation Needs (12/16/2022)  Utilities: Not At Risk (12/16/2022)  Tobacco Use: Medium Risk (12/19/2022)    Readmission Risk Interventions    12/17/2022   11:32 AM  Readmission Risk Prevention Plan  Transportation Screening Complete  Home Care Screening Complete  Medication Review (RN CM) Complete

## 2022-12-23 NOTE — Progress Notes (Addendum)
Physical Therapy Treatment Patient Details Name: [name] MRN: [id number] DOB: 1945/07/11 Today's Date: 12/23/2022   History of Present Illness Pt. is a 77 y.o. male with medical history significant of hypertension, hyperlipidemia, coronary artery disease status post CABG, history of bladder cancer and prostate cancer s/p radiation who follows up with urology closely.    PT Comments  Pt observed ambulating with RW and wife at providing Supervision in hallway. Pt brought to stairwell for stair training. Use of single rail with CGA for safety up/down 8 steps with step to pattern. No LOB, slight fatigue noticed, pt encouraged to pace himself. Considering pt was able to negotiate 8 steps in both directions, therapist anticipates pt will be able to negotiate his entrance steps at home once cleared for d/c. Continue to recommend initial HHPT services.    If plan is discharge home, recommend the following: A little help with walking and/or transfers;A little help with bathing/dressing/bathroom;Assistance with feeding;Assist for transportation   Can travel by private vehicle        Equipment Recommendations  Rollator (4 wheels);Rolling walker (2 wheels)    Recommendations for Other Services       Precautions / Restrictions Precautions Precautions: Fall Restrictions Weight Bearing Restrictions: No     Mobility  Bed Mobility               General bed mobility comments: Pt received in chair and handed off to OT post session    Transfers Overall transfer level: Needs assistance Equipment used: Rolling walker (2 wheels) Transfers: Sit to/from Stand Sit to Stand: Supervision           General transfer comment: Use of B UE's for push off    Ambulation/Gait Ambulation/Gait assistance: Supervision Gait Distance (Feet): 160 Feet Assistive device: Rolling walker (2 wheels) Gait Pattern/deviations: Step-through pattern, Decreased step length - right, Decreased step length  - left Gait velocity: decreased     General Gait Details: Pt typically does not use walker but currently relies on UE support for balance. He was able to maintain a slow cadence Good effort, SpO2 remained in the mid/high 90s on room air   Stairs Stairs: Yes Stairs assistance: Contact guard assist Stair Management: One rail Right, Step to pattern Number of Stairs: 8 General stair comments: Pt completed 8 steps down and 8 steps up = full flight activity. Pt performed well with pacing. Anticipate he will be able to negotiate steps to enter home upon d/c.   Wheelchair Mobility     Tilt Bed    Modified Rankin (Stroke Patients Only)       Balance Overall balance assessment: Needs assistance Sitting-balance support: No upper extremity supported, Feet supported Sitting balance-Leahy Scale: Good     Standing balance support: Bilateral upper extremity supported, During functional activity Standing balance-Leahy Scale: Good Standing balance comment: reliant on the walker/UEs but no LOBs                            Cognition Arousal: Alert Behavior During Therapy: WFL for tasks assessed/performed Overall Cognitive Status: Within Functional Limits for tasks assessed Area of Impairment: Problem solving, Following commands                       Following Commands: Follows multi-step commands with increased time       General Comments: off from baseline, delayed but oriented        Exercises  13:00-13:13 Therapist returned in pm to educate and provide pt and pt's wife with B LE HEP in sitting and standing in order to improve strength, balance, and overall mobility. Pt and wife endorse good understanding. Also provided handout on options for shower chair/bench and recs to refer to HHPT.    General Comments General comments (skin integrity, edema, etc.): Educated pt and wife on safe negotiation of stairs and safe transition home once medically cleared for  d/c      Pertinent Vitals/Pain Pain Assessment Pain Assessment: No/denies pain    Home Living                          Prior Function            PT Goals (current goals can now be found in the care plan section) Acute Rehab PT Goals Patient Stated Goal: Get home Progress towards PT goals: Progressing toward goals    Frequency    Min 1X/week      PT Plan      Co-evaluation              AM-PAC PT "6 Clicks" Mobility   Outcome Measure  Help needed turning from your back to your side while in a flat bed without using bedrails?: A Little Help needed moving from lying on your back to sitting on the side of a flat bed without using bedrails?: A Little Help needed moving to and from a bed to a chair (including a wheelchair)?: A Little Help needed standing up from a chair using your arms (e.g., wheelchair or bedside chair)?: A Little Help needed to walk in hospital room?: A Little Help needed climbing 3-5 steps with a railing? : A Lot 6 Click Score: 17    End of Session Equipment Utilized During Treatment: Gait belt Activity Tolerance: Patient tolerated treatment well Patient left: Other (comment) (Hand off to OT for ADL's) Nurse Communication: Mobility status PT Visit Diagnosis: Muscle weakness (generalized) (M62.81);Difficulty in walking, not elsewhere classified (R26.2)     Time: 6578-4696 PT Time Calculation (min) (ACUTE ONLY): 17 min  Charges:    $Gait Training: 8-22 mins PT General Charges $$ ACUTE PT VISIT: 1 Visit                    Zadie Cleverly, PTA  Jannet Askew 12/23/2022, 1:14 PM

## 2022-12-23 NOTE — TOC Progression Note (Signed)
Transition of Care [facility]) - Progression Note    Patient Details  Name: [name] MRN: [id number] Date of Birth: 09-13-1945  [facility]) CM/SW Contact  Chapman Fitch, RN Phone Number: 12/23/2022, 3:53 PM  Clinical Narrative:     Anticipated DC tomorrow Followed up with patient and wife on home health.  They have selected [facility] home [facility].  Referral made to [facility] with [facility]  Referral made to Jon with [employer] for RW to be delivered to room prior to discharge   Expected Discharge Plan: Home w Home Health Services    Expected Discharge Plan and Services                                               Social Determinants of Health (SDOH) Interventions SDOH Screenings   Food Insecurity: No Food Insecurity (12/16/2022)  Housing: Low Risk  (12/16/2022)  Transportation Needs: No Transportation Needs (12/16/2022)  Utilities: Not At Risk (12/16/2022)  Tobacco Use: Medium Risk (12/19/2022)    Readmission Risk Interventions    12/17/2022   11:32 AM  Readmission Risk Prevention Plan  Transportation Screening Complete  Home Care Screening Complete  Medication Review (RN CM) Complete

## 2022-12-23 NOTE — Progress Notes (Signed)
Occupational Therapy Treatment Patient Details Name: [name] MRN: [id number] DOB: 09-28-1945 Today's Date: 12/23/2022   History of present illness Pt. is a 77 y.o. male with medical history significant of hypertension, hyperlipidemia, coronary artery disease status post CABG, history of bladder cancer and prostate cancer s/p radiation who follows up with urology closely.   OT comments  Pt. was finishing with PT upon arrival. Pt. Was assisted to the bathroom toilet. Pt. required supervision with the RW.  Modified independent with standing at the sinkside for hand hygiene. Supervision walking back to his chair navigating around furniture in the room. Pt./caregiver were assisted with problem solving through options for bathroom access at home 2/2 narrow doorways. Pt. Continues to benefit from OT services for ADL training, A/E training, and pt. Education about home modification, and DME.       If plan is discharge home, recommend the following:  A little help with walking and/or transfers;Assist for transportation;Assistance with cooking/housework;Direct supervision/assist for medications management;Help with stairs or ramp for entrance   Equipment Recommendations  BSC/3in1;Other (comment)    Recommendations for Other Services      Precautions / Restrictions Precautions Precautions: Fall Restrictions Weight Bearing Restrictions: No       Mobility Bed Mobility               General bed mobility comments: Pt. just finished with PT upon arrival, and heading to the restroom.    Transfers Overall transfer level: Needs assistance   Transfers: Sit to/from Stand Sit to Stand: Supervision           General transfer comment: Pt. walked from the bathroom to his chair navigating around furniture with supervision, and RW.     Balance                                           ADL either performed or assessed with clinical judgement   ADL                            Toilet Transfer: Supervision/safety;Set up   Toileting- Clothing Manipulation and Hygiene: Wife assisted Pt. with application of barrier cream              Extremity/Trunk Assessment Upper Extremity Assessment Upper Extremity Assessment: Overall WFL for tasks assessed            Vision       Perception     Praxis      Cognition Arousal: Alert Behavior During Therapy: WFL for tasks assessed/performed   Area of Impairment: Problem solving, Following commands                       Following Commands: Follows multi-step commands with increased time                Exercises      Shoulder Instructions       General Comments      Pertinent Vitals/ Pain       Pain Assessment Pain Assessment: No/denies pain  Home Living                                          Prior Functioning/Environment  Frequency  Min 1X/week        Progress Toward Goals  OT Goals(current goals can now be found in the care plan section)  Progress towards OT goals: Progressing toward goals  Acute Rehab OT Goals Patient Stated Goal: To return to PLOF OT Goal Formulation: With patient/family Time For Goal Achievement: 01/01/23 Potential to Achieve Goals: Good  Plan      Co-evaluation                 AM-PAC OT "6 Clicks" Daily Activity     Outcome Measure   Help from another person eating meals?: A Little Help from another person taking care of personal grooming?: A Little Help from another person toileting, which includes using toliet, bedpan, or urinal?: A Little Help from another person bathing (including washing, rinsing, drying)?: A Lot Help from another person to put on and taking off regular upper body clothing?: None Help from another person to put on and taking off regular lower body clothing?: A Little 6 Click Score: 18    End of Session Equipment Utilized During Treatment: Rolling  walker (2 wheels);Gait belt  OT Visit Diagnosis: Other abnormalities of gait and mobility (R26.89);Muscle weakness (generalized) (M62.81)   Activity Tolerance Patient tolerated treatment well   Patient Left in bed;with bed alarm set;with call bell/phone within reach   Nurse Communication Mobility status        Time: 6440-3474 OT Time Calculation (min): 19 min  Charges: OT General Charges $OT Visit: 1 Visit OT Treatments $Self Care/Home Management : 8-22 mins  Olegario Messier, MS, OTR/L   Olegario Messier 12/23/2022, 12:20 PM

## 2022-12-24 ENCOUNTER — Encounter: Payer: Medicare Other | Admitting: Internal Medicine

## 2022-12-24 DIAGNOSIS — N179 Acute kidney failure, unspecified: Secondary | ICD-10-CM | POA: Diagnosis not present

## 2022-12-24 DIAGNOSIS — E43 Unspecified severe protein-calorie malnutrition: Secondary | ICD-10-CM | POA: Insufficient documentation

## 2022-12-24 LAB — CBC WITH DIFFERENTIAL/PLATELET
Abs Immature Granulocytes: 0.07 10*3/uL (ref 0.00–0.07)
Basophils Absolute: 0.1 10*3/uL (ref 0.0–0.1)
Basophils Relative: 1 %
Eosinophils Absolute: 0.4 10*3/uL (ref 0.0–0.5)
Eosinophils Relative: 5 %
HCT: 22.1 % — ABNORMAL LOW (ref 39.0–52.0)
Hemoglobin: 7.2 g/dL — ABNORMAL LOW (ref 13.0–17.0)
Immature Granulocytes: 1 %
Lymphocytes Relative: 12 %
Lymphs Abs: 1.2 10*3/uL (ref 0.7–4.0)
MCH: 32.1 pg (ref 26.0–34.0)
MCHC: 32.6 g/dL (ref 30.0–36.0)
MCV: 98.7 fL (ref 80.0–100.0)
Monocytes Absolute: 0.9 10*3/uL (ref 0.1–1.0)
Monocytes Relative: 9 %
Neutro Abs: 7.1 10*3/uL (ref 1.7–7.7)
Neutrophils Relative %: 72 %
Platelets: 229 10*3/uL (ref 150–400)
RBC: 2.24 MIL/uL — ABNORMAL LOW (ref 4.22–5.81)
RDW: 13 % (ref 11.5–15.5)
WBC: 9.6 10*3/uL (ref 4.0–10.5)
nRBC: 0 % (ref 0.0–0.2)

## 2022-12-24 LAB — COMPREHENSIVE METABOLIC PANEL
ALT: 23 U/L (ref 0–44)
AST: 23 U/L (ref 15–41)
Albumin: 2.7 g/dL — ABNORMAL LOW (ref 3.5–5.0)
Alkaline Phosphatase: 45 U/L (ref 38–126)
Anion gap: 7 (ref 5–15)
BUN: 72 mg/dL — ABNORMAL HIGH (ref 8–23)
CO2: 27 mmol/L (ref 22–32)
Calcium: 8.3 mg/dL — ABNORMAL LOW (ref 8.9–10.3)
Chloride: 100 mmol/L (ref 98–111)
Creatinine, Ser: 3.41 mg/dL — ABNORMAL HIGH (ref 0.61–1.24)
GFR, Estimated: 18 mL/min — ABNORMAL LOW (ref >60)
Glucose, Bld: 99 mg/dL (ref 70–99)
Potassium: 4.5 mmol/L (ref 3.5–5.1)
Sodium: 134 mmol/L — ABNORMAL LOW (ref 135–145)
Total Bilirubin: 0.7 mg/dL (ref <1.2)
Total Protein: 6.7 g/dL (ref 6.5–8.1)

## 2022-12-24 MED ORDER — ENSURE ENLIVE PO LIQD
237.0000 mL | Freq: Three times a day (TID) | ORAL | Status: DC
  Administered 2022-12-24: 237 mL via ORAL

## 2022-12-24 MED ORDER — SORBITOL 70 % SOLN
960.0000 mL | TOPICAL_OIL | Freq: Once | ORAL | Status: AC
  Administered 2022-12-24: 960 mL via RECTAL
  Filled 2022-12-24: qty 240

## 2022-12-24 MED ORDER — POLYETHYLENE GLYCOL 3350 17 G PO PACK
34.0000 g | PACK | ORAL | Status: AC
Start: 2022-12-24 — End: 2022-12-24
  Administered 2022-12-24 (×2): 34 g via ORAL
  Filled 2022-12-24 (×2): qty 2

## 2022-12-24 NOTE — Progress Notes (Signed)
Occupational Therapy Treatment Patient Details Name: [name] MRN: [id number] DOB: 1945-09-19 Today's Date: 12/24/2022   History of present illness Pt. is a 77 y.o. male with medical history significant of hypertension, hyperlipidemia, coronary artery disease status post CABG, history of bladder cancer and prostate cancer s/p radiation who follows up with urology closely.   OT comments  Upon arrival Pt. was walking around the room preparing to help facilitate a BM. Pt. Walked in the hallway, and around the room with modified independence. Pt. Stood at the sinkside to brush his teeth with  Supervision. Pt. Performed toilet transfers with Modified independence, and minA for toilet hygiene. Pt. Is modified independence for hand hygiene standing at the sinkside. Pt. education was provided about A/E use for LE ADLs through visual demonstration. Pt. has discomfort sitting in a chair 2/2 having bowel issues. Pt. Continues to benefit from OT services for ADL training, A/E training, and pt. education about home modification, and DME. OT Discharge recommendations remain appropriate.      If plan is discharge home, recommend the following:  A little help with walking and/or transfers;Assist for transportation;Assistance with cooking/housework;Direct supervision/assist for medications management;Help with stairs or ramp for entrance   Equipment Recommendations  BSC/3in1;Other (comment)    Recommendations for Other Services      Precautions / Restrictions Precautions Precautions: Fall Restrictions Weight Bearing Restrictions: No       Mobility Bed Mobility                    Transfers   Equipment used: Rolling walker (2 wheels) Transfers: Sit to/from Stand Sit to Stand: Modified independent (Device/Increase time)                 Balance                                           ADL either performed or assessed with clinical judgement   ADL                            Toilet Transfer: Modified Independent   Toileting- Clothing Manipulation and Hygiene: Minimal assistance              Extremity/Trunk Assessment              Vision Baseline Vision/History: 1 Wears glasses Patient Visual Report: No change from baseline     Perception     Praxis      Cognition Arousal: Alert   Overall Cognitive Status: Within Functional Limits for tasks assessed Area of Impairment: Problem solving, Following commands                       Following Commands: Follows multi-step commands with increased time                Exercises      Shoulder Instructions       General Comments      Pertinent Vitals/ Pain       Pain Assessment Pain Assessment: No/denies pain  Home Living                                          Prior Functioning/Environment  Frequency  Min 1X/week        Progress Toward Goals  OT Goals(current goals can now be found in the care plan section)  Progress towards OT goals: Progressing toward goals  Acute Rehab OT Goals Patient Stated Goal: To return to PLOF OT Goal Formulation: With patient/family Time For Goal Achievement: 01/01/23 Potential to Achieve Goals: Good  Plan      Co-evaluation                 AM-PAC OT "6 Clicks" Daily Activity     Outcome Measure   Help from another person eating meals?: A Little Help from another person taking care of personal grooming?: A Little Help from another person toileting, which includes using toliet, bedpan, or urinal?: A Little Help from another person bathing (including washing, rinsing, drying)?: A Lot Help from another person to put on and taking off regular upper body clothing?: A Lot Help from another person to put on and taking off regular lower body clothing?: A Little 6 Click Score: 16    End of Session Equipment Utilized During Treatment: Rolling walker (2  wheels)  OT Visit Diagnosis: Other abnormalities of gait and mobility (R26.89);Muscle weakness (generalized) (M62.81)   Activity Tolerance Patient tolerated treatment well   Patient Left     Nurse Communication Mobility status        Time: 6045-4098 OT Time Calculation (min): 23 min  Charges: OT General Charges $OT Visit: 1 Visit OT Treatments $Self Care/Home Management : 23-37 mins  Olegario Messier, MS, OTR/L   Olegario Messier 12/24/2022, 1:52 PM

## 2022-12-24 NOTE — Progress Notes (Signed)
PROGRESS NOTE    [name]  NFA:[id number] DOB: 1945-07-31 DOA: 12/16/2022 PCP: Jake Nurse, MD  [id number]/[id number]  LOS: 8 days   Brief hospital course:   Assessment & Plan: 77 y.o. male with medical history significant of hypertension, hyperlipidemia, coronary artery disease status post CABG, history of bladder cancer and prostate cancer s/p radiation who follows up with urology closely.  Patient presents to the emergency room today after he was called to come to the emergency room by hematology Dr. Smith Johns on account of having worsening renal function and hyperkalemia.  According to patient in August he followed up with his primary care physician and he was found to have worsening anemia and therefore referred to hematology for further management.  Patient saw hematologist today Dr. Smith Johns and underwent some blood work and was later called to come to the emergency room on account of the abnormal labs.    Acute renal failure likely in the setting of possible multiple myeloma Patient presented with creatinine of 9 Baseline creatinine of 1.5-2 in the last 4 to 5 months  Elevated free kappa and lambda obtained in September 2024 Given elevated protein gap as well as rapidly worsening renal function in the setting of anemia, this raises high suspicion for multiple myeloma Nephrology on board Status post bone marrow biopsy 10/30 Status post inpatient PET scan 10/30 Kidney biopsy is on hold as interventional radiology is unwilling to perform it due to the presence of chronic bilateral hydronephrosis.   Plan: --outpatient f/u with oncology  Bilateral hydronephrosis --urology consulted.  Foley inserted. --cont silodosin --cont Foley with plans for outpatient voiding trail in 1-2 weeks   Weakness Functional decline --PT/OT   Normocytic anemia likely 2 secondary to anemia of chronic disease No indication for blood transfusion at this time Monitor CBC closely   Hyperkalemia in the setting  of worsening renal failure Patient received hyperkalemia cocktail in the emergency room Potassium has improved --will d/c home potassium    Mild leukocytosis Downtrending No clear evidence of infection Continue to monitor closely No indication for antibiotics at this time   Severe metabolic acidosis secondary to acute renal failure Renal tubular acidosis type IV IV bicarb has been discontinued.  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   Hyperlipidemia --cont statin   Hypertension Hold home BP meds due to soft BP    History of bladder cancer History of prostate cancer Outpatient follow-up with urology  Coronary artery disease status post CABG Continue statin and aspirin  Severe malnutrition in context of chronic illness    DVT prophylaxis: SCD/Compression stockings Code Status: Full code  Family Communication: wife updated at bedside today Level of care: Telemetry Medical Dispo:   The patient is from: home Anticipated d/c is to: home Anticipated d/c date is: tomorrow   Subjective and Interval History:  Pt reported feeling constipated and said he can't go home until he has a good BM.    Practiced stairs with PT, reportedly no issues.   Objective: Vitals:   12/24/22 0417 12/24/22 0812 12/24/22 1614 12/24/22 1840  BP: 107/63 108/62 (!) 97/47   Pulse: 78 71 97   Resp: 18 18 18    Temp: 98 F (36.7 C) 98.5 F (36.9 C) 97.7 F (36.5 C)   TempSrc: Oral Oral Oral   SpO2: 97% 99% 96%   Weight:    70.5 kg  Height:  Intake/Output Summary (Last 24 hours) at 12/24/2022 1859 Last data filed at 12/24/2022 0848 Gross per 24 hour  Intake 250 ml  Output 1800 ml  Net -1550 ml   Filed Weights   12/16/22 1900 12/17/22 0826 12/24/22 1840  Weight: 63.8 kg 63.8 kg 70.5 kg    Examination:   Constitutional: NAD, AAOx3, walking  around with walker HEENT: conjunctivae and lids normal, EOMI CV: No cyanosis.   RESP: normal respiratory effort, on RA Neuro: II - XII grossly intact.     Data Reviewed: I have personally reviewed labs and imaging studies  Time spent: 50 minutes  Jake Priestly, MD [facility] If 7PM-7AM, please contact night-coverage 12/24/2022, 6:59 PM

## 2022-12-24 NOTE — Plan of Care (Signed)
Plan of care is reviewed. Pt has been progressing. He is stable hemodynamically, afebrile, normal respiratory rate and efforts. No acute distress noted over night. He is able to ambulate in hallway x 6-7 rounds very well tolerated with walker and a staff nurse standby assisting. He is able to rest well. He has a complaint of constipation.   Today he had a very small bowel movement. Senokot was given, increasing consumption of fiber from fruit and vegetables and fluid intake were encouraged. We will monitor.  Problem: Health Behavior/Discharge Planning: Goal: Ability to manage health-related needs will improve Outcome: Progressing   Problem: Clinical Measurements: Goal: Ability to maintain clinical measurements within normal limits will improve Outcome: Progressing Goal: Will remain free from infection Outcome: Progressing Goal: Diagnostic test results will improve Outcome: Progressing Goal: Respiratory complications will improve Outcome: Progressing Goal: Cardiovascular complication will be avoided Outcome: Progressing   Problem: Activity: Goal: Risk for activity intolerance will decrease Outcome: Progressing   Problem: Nutrition: Goal: Adequate nutrition will be maintained Outcome: Progressing   Problem: Coping: Goal: Level of anxiety will decrease Outcome: Progressing   Problem: Elimination: Goal: Will not experience complications related to bowel motility Outcome: Progressing Goal: Will not experience complications related to urinary retention Outcome: Progressing   Problem: Pain Management: Goal: General experience of comfort will improve Outcome: Progressing   Filiberto Pinks, RN

## 2022-12-24 NOTE — Plan of Care (Signed)

## 2022-12-24 NOTE — Progress Notes (Signed)
[facility]  ROUNDING NOTE   Subjective:   Pateint was last seen in nephrology office on 9/12 for work up for worsening renal function and nephrotic range proteinuria and hematuria.  Patient sitting up in bed, preparing to sit in chair Wife at bedside States he feels well Appetite slowly improving without nausea or vomiting Was able to ambulate in hallway with therapy along with stairs.  Creatinine 3.71 (3.41) Urine output 2.7 L  Objective:  Vital signs in last 24 hours:  Temp:  [97.9 F (36.6 C)-98.7 F (37.1 C)] 98.5 F (36.9 C) (11/06 0812) Pulse Rate:  [71-100] 71 (11/06 0812) Resp:  [18] 18 (11/06 0812) BP: (106-121)/(55-73) 108/62 (11/06 0812) SpO2:  [96 %-99 %] 99 % (11/06 0812)  Weight change:  Filed Weights   12/16/22 1900 12/17/22 0826  Weight: 63.8 kg 63.8 kg    Intake/Output: I/O last 3 completed shifts: In: 250 [P.O.:250] Out: 2700 [Urine:2700]   Intake/Output this shift:  Total I/O In: -  Out: 600 [Urine:600]  Physical Exam: General: NAD  Head: Normocephalic, atraumatic. Moist oral mucosal membranes  Eyes: Anicteric  Lungs:  Clear to auscultation, normal effort  Heart: Regular rate and rhythm  Abdomen:  Soft, nontender  Extremities:  no peripheral edema.  Neurologic: Alert and oriented  Skin: No lesions     GU Foley catheter  Basic Metabolic Panel: Recent Labs  Lab 12/20/22 0352 12/21/22 0341 12/22/22 0413 12/23/22 0411 12/24/22 0431  NA 133* 134* 137 135 134*  K 3.9 4.2 4.2 3.9 4.5  CL 96* 95* 96* 98 100  CO2 28 31 31 28 27   GLUCOSE 137* 125* 102* 117* 99  BUN 97* 90* 79* 77* 72*  CREATININE 5.30* 4.60* 4.08* 3.71* 3.41*  CALCIUM 8.1* 8.2* 8.3* 8.4* 8.3*    Liver Function Tests: Recent Labs  Lab 12/24/22 0431  AST 23  ALT 23  ALKPHOS 45  BILITOT 0.7  PROT 6.7  ALBUMIN 2.7*   No results for input(s): "LIPASE", "AMYLASE" in the last 168 hours. No results for input(s): "AMMONIA" in the last 168  hours.  CBC: Recent Labs  Lab 12/20/22 0352 12/21/22 0341 12/22/22 0413 12/23/22 0411 12/24/22 0431  WBC 9.0 8.9 9.2 8.8 9.6  NEUTROABS 7.0 6.5 6.5 6.3 7.1  HGB 7.2* 7.7* 7.6* 7.1* 7.2*  HCT 21.8* 23.3* 23.3* 21.8* 22.1*  MCV 96.5 96.7 98.3 99.1 98.7  PLT 210 225 238 226 229    Cardiac Enzymes: No results for input(s): "CKTOTAL", "CKMB", "CKMBINDEX", "TROPONINI" in the last 168 hours.  BNP: Invalid input(s): "POCBNP"  CBG: No results for input(s): "GLUCAP" in the last 168 hours.     Coagulation Studies: No results for input(s): "LABPROT", "INR" in the last 72 hours.   Urinalysis: No results for input(s): "COLORURINE", "LABSPEC", "PHURINE", "GLUCOSEU", "HGBUR", "BILIRUBINUR", "KETONESUR", "PROTEINUR", "UROBILINOGEN", "NITRITE", "LEUKOCYTESUR" in the last 72 hours.  Invalid input(s): "APPERANCEUR"     Imaging: No results found.   Medications:      aspirin EC  81 mg Oral Daily   atorvastatin  10 mg Oral Daily   Chlorhexidine Gluconate Cloth  6 each Topical Daily   feeding supplement  237 mL Oral BID BM   loratadine  10 mg Oral Daily   multivitamin with minerals  1 tablet Oral Daily   polyethylene glycol  34 g Oral Q2H   senna-docusate  1 tablet Oral BID   silodosin  8 mg Oral Q breakfast   simethicone  80 mg Oral  QID   fluticasone, melatonin  Assessment/ Plan:  [name] is a 77 y.o.  male with hypertension, AAA, hyperlipidemia, coronary artery disease status post CABG, bladder cancer, glaucoma who [facility] with Hyperkalemia [E87.5] Hyponatremia [E87.1] AKI (acute kidney injury) (HCC) [N17.9] Anemia, unspecified type [D64.9] Acute renal failure superimposed on chronic kidney disease, unspecified acute renal failure type, unspecified CKD stage (HCC) [N17.9, N18.9]  Acute Kidney Injury with hyperkalemia on Chronic kidney disease stage IV with proteinuria: concern for underlying multiple myeloma and cast  nephropathy.  Hydronephrosis noted on renal ultrasound. -Myeloma kidney less likely as serum creatinine has improved significantly after Foley catheter placement. -  urology will follow-up outpatient with voiding trial. -Will consider renal biopsy outpatient due to presence of hydronephrosis.  -Will need outpatient follow-up in our office in 1 to 2 weeks.  Hyponatremia: Possibly from AKI. - Sodium slightly decreased at 134.  No immediate intervention required.  BPH/obstructive uropathy/hydronephrosis  -Continue silodosin at discharge.  Patient has not tolerated tamsulosin in the past.  Anemia with chronic kidney disease: with leukocytosis. Concerning for multiple myeloma or other blood cell dyscrasia.  Hematology will continue to follow this patient outpatient.    LOS: 8 [name] 11/6/202411:42 AM

## 2022-12-24 NOTE — Progress Notes (Signed)
Nutrition Follow-up  DOCUMENTATION CODES:   Severe malnutrition in context of chronic illness  INTERVENTION:   Ensure Enlive po TID, each supplement provides 350 kcal and 20 grams of protein.  Magic cup TID with meals, each supplement provides 290 kcal and 9 grams of protein  MVI po daily   Pt at high refeed risk; recommend monitor potassium, magnesium and phosphorus labs daily until stable  Daily weights   NUTRITION DIAGNOSIS:   Severe Malnutrition related to chronic illness as evidenced by percent weight loss, severe fat depletion, severe muscle depletion. -new diagnosis   GOAL:   Patient will meet greater than or equal to 90% of their needs -not met   MONITOR:   PO intake, Supplement acceptance, Labs, Weight trends, Skin, I & O's  ASSESSMENT:   77 y/o male with h/o bladder cancer s/p TRBT/XRT, CAD s/p CABG x 4, BPH, HTN and HLD who is admitted with acute renal failure likely in the setting of possible multiple myeloma.  Met with pt and pt's wife in room. Pt reports that he is feeling horrible today. Pt complaining of constipation. Pt's wife at bedside reports pt with poor appetite and oral intake for the past several months. Wife reports that pt has lost ~70lbs over the past year. Per chart, pt is down 32lbs(19%) over the past 5 months; this is severe weight loss. Pt reports that he has been drinking Ensure supplements at home and in hospital. Pt reports ongoing poor oral intake in hospital; pt ate a few bites of ice cream today. RD will increase Ensure to three times daily and add Magic Cups to meal trays. Pt is at high refeed risk. Per chart, pt is up ~15lbs since admission.   Medications reviewed and include: aspirin, MVI, miralax, senokot, simethicone  Labs reviewed: Na 134(L), K 4.5 wnl, BUN 72(H), creat 3.41(H) Hgb 7.2(L), Hct 22.1(L)  NUTRITION - FOCUSED PHYSICAL EXAM:  Flowsheet Row Most Recent Value  Orbital Region Mild depletion  Upper Arm Region Severe  depletion  Thoracic and Lumbar Region Unable to assess  Buccal Region Mild depletion  Temple Region No depletion  Clavicle Bone Region Moderate depletion  Clavicle and Acromion Bone Region Moderate depletion  Scapular Bone Region Mild depletion  Dorsal Hand Mild depletion  Patellar Region Severe depletion  Anterior Thigh Region Severe depletion  Posterior Calf Region Severe depletion  Edema (RD Assessment) Mild  Hair Reviewed  Eyes Reviewed  Mouth Reviewed  Skin Reviewed  Nails Reviewed   Diet Order:   Diet Order             Diet regular Fluid consistency: Thin  Diet effective now                  EDUCATION NEEDS:   Education needs have been addressed  Skin:  Skin Assessment: Reviewed RN Assessment  Last BM:  11/6- type 6  Height:   Ht Readings from Last 1 Encounters:  12/17/22 5\' 6"  (1.676 m)    Weight:   Wt Readings from Last 1 Encounters:  12/17/22 63.8 kg    Ideal Body Weight:  64.5 kg  BMI:  Body mass index is 22.7 kg/m.  Estimated Nutritional Needs:   Kcal:  1700-2000kcal/day  Protein:  85-100g/day  Fluid:  1.7-1.9L/day  Betsey Holiday MS, RD, LDN Please refer to [facility] for RD and/or RD on-call/weekend/after hours pager

## 2022-12-24 NOTE — Progress Notes (Signed)
Mobility Specialist - Progress Note     12/24/22 0853  Mobility  Activity Ambulated with assistance in hallway  Level of Assistance Modified independent, requires aide device or extra time  Assistive Device Front wheel walker  Distance Ambulated (ft) 320 ft  Range of Motion/Exercises Active  Activity Response Tolerated well  Mobility Referral Yes  $Mobility charge 1 Mobility  Mobility Specialist Start Time (ACUTE ONLY) [id number]  Mobility Specialist Stop Time (ACUTE ONLY) 0846  Mobility Specialist Time Calculation (min) (ACUTE ONLY) 18 min   Pt resting in recliner on RA upon entry. Pt STS and ambulates to hallway around NS for 2 laps ModI with RW. Pt endorses no lightheadedness or dizziness during ambulation. Pt returned to bed and left with needs in reach. Chair alarm activated.   Johnathan Hausen Mobility Specialist 12/24/22, 8:59 AM

## 2022-12-25 ENCOUNTER — Encounter (HOSPITAL_COMMUNITY): Payer: Self-pay

## 2022-12-25 ENCOUNTER — Encounter: Payer: Medicare Other | Admitting: Physician Assistant

## 2022-12-25 MED ORDER — ENSURE ENLIVE PO LIQD: 237.0000 mL | Freq: Three times a day (TID) | ORAL | Status: AC

## 2022-12-25 NOTE — Discharge Summary (Signed)
Physician Discharge Summary   [name]  male DOB: Dec 06, 1945  ZOX:[id number]  PCP: [name], MD  Admit date: 12/16/2022 Discharge date: 12/25/2022  Admitted From: home Disposition:  home Home Health: Yes CODE STATUS: Full code  Discharge Instructions     No wound care   Complete by: As directed       Hospital Course:  For full details, please see H&P, progress notes, consult notes and ancillary notes.  Briefly,  [name] is a 77 y.o. male with medical history significant of hypertension, coronary artery disease status post CABG, history of bladder cancer and prostate cancer s/p radiation who follows up with urology closely.  Patient presented to the emergency room after he was called to come to the emergency room by hematology [name] on account of having worsening renal function and hyperkalemia.     Acute renal failure likely in the setting of possible multiple myeloma Patient presented with creatinine of 9 Baseline creatinine of 1.5-2 in the last 4 to 5 months. Elevated free kappa and lambda obtained in September 2024 Nephrology consulted. Status post bone marrow biopsy 10/30 Status post inpatient PET scan 10/30 Kidney biopsy is on hold as interventional radiology is unwilling to perform it due to the presence of chronic bilateral hydronephrosis.   --outpatient f/u with oncology and nephrology. --Cr improved to 3.41 prior to discharge.   Bilateral hydronephrosis --urology consulted.  Foley inserted. --cont silodosin --cont Foley with plans for outpatient voiding trail in 1-2 weeks   Weakness Functional decline --PT/OT   Normocytic anemia likely 2 secondary to anemia of chronic disease According to patient, in August he followed up with his primary care physician and he was found to have worsening anemia and therefore referred to hematology for further management.  No indication for blood transfusion at this time   Hyperkalemia in the  setting of worsening renal failure Patient received hyperkalemia cocktail in the emergency room Potassium has normalized.   Mild leukocytosis No clear evidence of infection.  WBC normalized without abx.   Severe metabolic acidosis secondary to acute renal failure Renal tubular acidosis type IV S/p IV bicarb.  Acidosis is resolved   Hyponatremia Suspect pseudohyponatremia in the setting of plasma cell dyscrasia As evidenced by hyperosmolarity   Hyperlipidemia --cont statin   Hypertension Home Lopressor held during hospitalization due to soft BP, to be resumed after discharge. --home Imdur d/c'ed.   History of bladder cancer History of prostate cancer Outpatient follow-up with urology   Coronary artery disease status post CABG Continue statin and aspirin   Severe malnutrition in context of chronic illness    Discharge Diagnoses:  Principal Problem:   AKI (acute kidney injury) (HCC) Active Problems:   Protein-calorie malnutrition, severe   30 Day Unplanned Readmission Risk Score    Flowsheet Row ED to Hosp-Admission (Current) from 12/16/2022 in [facility]  30 Day Unplanned Readmission Risk Score (%) 25.64 Filed at 12/25/2022 0801       This score is the patient's risk of an unplanned readmission within 30 days of being discharged (0 -100%). The score is based on dignosis, age, lab data, medications, orders, and past utilization.   Low:  0-14.9   Medium: 15-21.9   High: 22-29.9   Extreme: 30 and above         Discharge Instructions:  Allergies as of 12/25/2022       Reactions   Cidex [glutaral] Anaphylaxis   Other Anaphylaxis   -  Ortho-phthalaldehyde  - trospion cause AMS        Medication List     STOP taking these medications    HYDROcodone-acetaminophen 5-325 MG tablet Commonly known as: NORCO/VICODIN   isosorbide mononitrate 30 MG 24 hr tablet Commonly known as: IMDUR   Potassium Gluconate 595 MG Caps        TAKE these medications    acetaminophen 500 MG tablet Commonly known as: TYLENOL Take 500 mg by mouth every 6 (six) hours as needed.   aspirin 81 MG tablet Take 81 mg by mouth daily.   atorvastatin 10 MG tablet Commonly known as: LIPITOR Take 10 mg by mouth daily.   cetirizine 10 MG tablet Commonly known as: ZYRTEC Take 10 mg by mouth at bedtime.   Coenzyme Q10 100 MG capsule Take 100 mg by mouth daily.   cyanocobalamin 1000 MCG tablet Commonly known as: VITAMIN B12 Take 1,000 mcg by mouth daily.   feeding supplement Liqd Take 237 mLs by mouth 3 (three) times daily between meals.   ferrous sulfate 324 MG Tbec Take 324 mg by mouth.   Fish Oil 1000 MG Caps Take 1,000 mg by mouth daily.   fluticasone 50 MCG/ACT nasal spray Commonly known as: FLONASE Place 1 spray into both nostrils as needed.   GLUCOSAMINE-CHONDROITIN PO Take 1 tablet by mouth 2 (two) times daily.   Magnesium 200 MG Tabs Take 200 mg by mouth 2 (two) times daily.   Mega Probiotic Caps Take 1 capsule by mouth daily.   metoprolol tartrate 25 MG tablet Commonly known as: LOPRESSOR Take 25 mg by mouth 2 (two) times daily.   metroNIDAZOLE 0.75 % gel Commonly known as: METROGEL Apply 1 application topically 2 (two) times daily.   multivitamin tablet Take 1 tablet by mouth daily.   nitroGLYCERIN 0.4 MG SL tablet Commonly known as: NITROSTAT Place under the tongue.   senna-docusate 8.6-50 MG tablet Commonly known as: Senokot-S Take 1 tablet by mouth 2 (two) times daily as needed for mild constipation.   silodosin 8 MG Caps capsule Commonly known as: RAPAFLO Take 1 capsule (8 mg total) by mouth daily with breakfast.   simethicone 80 MG chewable tablet Commonly known as: MYLICON Chew 1 tablet (80 mg total) by mouth 4 (four) times daily as needed for flatulence.   Vitamin A 2400 MCG (8000 UT) Caps Take 8,000 Units by mouth daily.   vitamin C 250 MG tablet Commonly known as:  ASCORBIC ACID Take 250 mg by mouth 2 (two) times daily.   zinc gluconate 50 MG tablet Take 50 mg by mouth daily.               Durable Medical Equipment  (From admission, onward)           Start     Ordered   12/23/22 1103  For home use only DME Walker rolling  Once       Question Answer Comment  Walker: With 5 Inch Wheels   Patient needs a walker to treat with the following condition Weakness      12/23/22 1103             Follow-up Information     Mosetta Pigeon, MD Follow up.   Specialty: Nephrology Contact information: [address] [place] [address] [phone]                 Allergies  Allergen Reactions   Cidex [Glutaral] Anaphylaxis   Other Anaphylaxis    -  Ortho-phthalaldehyde  - trospion cause AMS     The results of significant diagnostics from this hospitalization (including imaging, microbiology, ancillary and laboratory) are listed below for reference.   Consultations:   Procedures/Studies: DG Abd 1 View  Result Date: 12/20/2022 CLINICAL DATA:  Constipation EXAM: ABDOMEN - 1 VIEW COMPARISON:  None Available. FINDINGS: Nonobstructive bowel gas pattern. Small stool burden. No gross free intraperitoneal gas. No organomegaly. Vascular calcifications are seen within the abdomen and pelvis. Osseous structures are age-appropriate. No acute bone abnormality IMPRESSION: 1. Nonobstructive bowel gas pattern.  Small stool burden. Electronically Signed   By: Helyn Numbers M.D.   On: 12/20/2022 17:16   NM PET Image Initial (PI) Whole Body (F-18 FDG)  Result Date: 12/17/2022 CLINICAL DATA:  Initial treatment strategy for multiple myeloma. Prior history of bladder carcinoma and prostate carcinoma. EXAM: NUCLEAR MEDICINE PET WHOLE BODY TECHNIQUE: 7.8 mCi F-18 FDG was injected intravenously. Full-ring PET imaging was performed from the head to foot after the radiotracer. CT data was obtained and used for attenuation  correction and anatomic localization. Fasting blood glucose: 97 mg/dl COMPARISON:  None Available. FINDINGS: Mediastinal blood-pool activity (background): SUV max = 2.7 Liver activity (reference): SUV max = N/A NECK:  No hypermetabolic lymph nodes or masses. Incidental CT findings:  None. CHEST: No hypermetabolic lymph nodes. No suspicious pulmonary nodules seen on CT images. Incidental CT findings:  None. ABDOMEN/PELVIS: No abnormal hypermetabolic activity within the liver, pancreas, adrenal glands, or spleen. No hypermetabolic lymph nodes in the abdomen or pelvis. Incidental CT findings: Mildly enlarged prostate gland. Foley catheter seen within bladder. Diffuse bladder wall thickening is seen, likely due to chronic bladder outlet obstruction or cystitis. Mild moderate bilateral hydroureteronephrosis is seen to the level of the bladder, likely due to vesicoureteral reflux. SKELETON: No focal lytic or sclerotic bone lesions are seen on CT images. No focal hypermetabolic bone lesions. Incidental CT findings:  None. EXTREMITIES: No focal hypermetabolic bone lesions identified. No focal lytic or sclerotic bone lesion seen on CT images. Incidental CT findings: none IMPRESSION: No focal hypermetabolic or lytic bone lesions identified. Electronically Signed   By: Danae Orleans M.D.   On: 12/17/2022 15:39   IR BONE MARROW BIOPSY & ASPIRATION  Result Date: 12/17/2022 INDICATION: Multiple myeloma workup EXAM: Bone marrow aspiration and core biopsy using fluoroscopic guidance MEDICATIONS: None. ANESTHESIA/SEDATION: Moderate (conscious) sedation was employed during this procedure. A total of Versed 0.5 mg and Fentanyl 25 mcg was administered intravenously. Moderate Sedation Time: 10 minutes. The patient's level of consciousness and vital signs were monitored continuously by radiology nursing throughout the procedure under my direct supervision. FLUOROSCOPY TIME:  Fluoroscopy Time: 0.8 minutes (3 mGy) COMPLICATIONS: None  immediate. PROCEDURE: Informed written consent was obtained from the patient after a thorough discussion of the procedural risks, benefits and alternatives. All questions were addressed. Maximal Sterile Barrier Technique was utilized including caps, mask, sterile gowns, sterile gloves, sterile drape, hand hygiene and skin antiseptic. A timeout was performed prior to the initiation of the procedure. The patient was placed prone on the exam table. Limited fluoroscopy of the pelvis was performed for planning purposes. Skin entry site was marked, and the overlying skin was prepped and draped in the standard sterile fashion. Local analgesia was obtained with 1% lidocaine. Using fluoroscopic guidance, an 11 gauge needle was advanced just deep to the cortex of the right posterior ilium. Subsequently, bone marrow aspiration and core biopsy were performed. Specimens were submitted to lab/pathology for handling. Hemostasis was achieved  with manual pressure, and a clean dressing was placed. The patient tolerated the procedure well without immediate complication. IMPRESSION: Successful bone marrow aspiration and core biopsy of the right posterior ilium using fluoroscopic guidance. Electronically Signed   By: Olive Bass M.D.   On: 12/17/2022 11:54   US Renal  Result Date: 12/16/2022 CLINICAL DATA:  Acute on chronic renal failure. EXAM: RENAL / URINARY TRACT ULTRASOUND COMPLETE COMPARISON:  April 17, 2022 FINDINGS: Right Kidney: Renal measurements: 9.7 cm x 5.8 cm x 5.0 cm = volume: 146 mL. There is diffusely increased echogenicity of the renal parenchyma. A 2.6 cm x 2.5 cm x 3.0 cm and 2.5 cm x 1.9 cm x 2.1 cm cysts are seen within the right kidney. There is mild right-sided hydronephrosis. Left Kidney: Renal measurements: 10.6 cm x 6.2 cm x 6.0 cm = volume: 207 mL. There is diffusely increased echogenicity of the renal parenchyma. 1.7 cm x 1.6 cm x 2.0 cm and 4.3 cm x 3.7 cm x 5.4 cm simple cysts are seen within the  left kidney. There is moderate severity left-sided hydronephrosis. Bladder: A Foley catheter is present. Other: None. IMPRESSION: 1. Bilateral echogenic kidneys, likely secondary to medical renal disease. 2. Multiple large, bilateral simple renal cysts. 3. Bilateral hydronephrosis, left greater than right. Electronically Signed   By: Aram Candela M.D.   On: 12/16/2022 21:01      Labs: BNP (last 3 results) No results for input(s): "BNP" in the last 8760 hours. Basic Metabolic Panel: Recent Labs  Lab 12/20/22 0352 12/21/22 0341 12/22/22 0413 12/23/22 0411 12/24/22 0431  NA 133* 134* 137 135 134*  K 3.9 4.2 4.2 3.9 4.5  CL 96* 95* 96* 98 100  CO2 28 31 31 28 27   GLUCOSE 137* 125* 102* 117* 99  BUN 97* 90* 79* 77* 72*  CREATININE 5.30* 4.60* 4.08* 3.71* 3.41*  CALCIUM 8.1* 8.2* 8.3* 8.4* 8.3*   Liver Function Tests: Recent Labs  Lab 12/24/22 0431  AST 23  ALT 23  ALKPHOS 45  BILITOT 0.7  PROT 6.7  ALBUMIN 2.7*   No results for input(s): "LIPASE", "AMYLASE" in the last 168 hours. No results for input(s): "AMMONIA" in the last 168 hours. CBC: Recent Labs  Lab 12/20/22 0352 12/21/22 0341 12/22/22 0413 12/23/22 0411 12/24/22 0431  WBC 9.0 8.9 9.2 8.8 9.6  NEUTROABS 7.0 6.5 6.5 6.3 7.1  HGB 7.2* 7.7* 7.6* 7.1* 7.2*  HCT 21.8* 23.3* 23.3* 21.8* 22.1*  MCV 96.5 96.7 98.3 99.1 98.7  PLT 210 225 238 226 229   Cardiac Enzymes: No results for input(s): "CKTOTAL", "CKMB", "CKMBINDEX", "TROPONINI" in the last 168 hours. BNP: Invalid input(s): "POCBNP" CBG: No results for input(s): "GLUCAP" in the last 168 hours. D-Dimer No results for input(s): "DDIMER" in the last 72 hours. Hgb A1c No results for input(s): "HGBA1C" in the last 72 hours. Lipid Profile No results for input(s): "CHOL", "HDL", "LDLCALC", "TRIG", "CHOLHDL", "LDLDIRECT" in the last 72 hours. Thyroid function studies No results for input(s): "TSH", "T4TOTAL", "T3FREE", "THYROIDAB" in the last 72  hours.  Invalid input(s): "FREET3" Anemia work up No results for input(s): "VITAMINB12", "FOLATE", "FERRITIN", "TIBC", "IRON", "RETICCTPCT" in the last 72 hours. Urinalysis    Component Value Date/Time   COLORURINE RED (A) 12/16/2022 1513   APPEARANCEUR TURBID (A) 12/16/2022 1513   APPEARANCEUR Cloudy (A) 10/01/2022 1414   LABSPEC  12/16/2022 1513    TEST NOT REPORTED DUE TO COLOR INTERFERENCE OF URINE PIGMENT   PHURINE  12/16/2022 1513    TEST NOT REPORTED DUE TO COLOR INTERFERENCE OF URINE PIGMENT   GLUCOSEU (A) 12/16/2022 1513    TEST NOT REPORTED DUE TO COLOR INTERFERENCE OF URINE PIGMENT   HGBUR (A) 12/16/2022 1513    TEST NOT REPORTED DUE TO COLOR INTERFERENCE OF URINE PIGMENT   BILIRUBINUR (A) 12/16/2022 1513    TEST NOT REPORTED DUE TO COLOR INTERFERENCE OF URINE PIGMENT   BILIRUBINUR Negative 10/01/2022 1414   KETONESUR (A) 12/16/2022 1513    TEST NOT REPORTED DUE TO COLOR INTERFERENCE OF URINE PIGMENT   PROTEINUR (A) 12/16/2022 1513    TEST NOT REPORTED DUE TO COLOR INTERFERENCE OF URINE PIGMENT   NITRITE (A) 12/16/2022 1513    TEST NOT REPORTED DUE TO COLOR INTERFERENCE OF URINE PIGMENT   LEUKOCYTESUR (A) 12/16/2022 1513    TEST NOT REPORTED DUE TO COLOR INTERFERENCE OF URINE PIGMENT   Sepsis Labs Recent Labs  Lab 12/21/22 0341 12/22/22 0413 12/23/22 0411 12/24/22 0431  WBC 8.9 9.2 8.8 9.6   Microbiology No results found for this or any previous visit (from the past 240 hour(s)).   Total time spend on discharging this patient, including the last patient exam, discussing the hospital stay, instructions for ongoing care as it relates to all pertinent caregivers, as well as preparing the medical discharge records, prescriptions, and/or referrals as applicable, is 35 minutes.    Darlin Priestly, MD  [facility] 12/25/2022, 8:31 AM

## 2022-12-25 NOTE — Progress Notes (Signed)
Patient is not able to walk the distance required to go the bathroom, or he/she is unable to safely negotiate stairs required to access the bathroom.  A 3in1 BSC will alleviate this problem  

## 2022-12-25 NOTE — TOC Transition Note (Signed)
Transition of Care [facility]) - CM/SW Discharge Note   Patient Details  Name: [name] MRN: [id number] Date of Birth: October 25, 1945  [facility] CM/SW Contact:  Garret Reddish, RN Phone Number: 12/25/2022, 12:55 PM   Clinical Narrative:     Chart reviewed.  Noted that patient has orders for discharge today.    Patient has requested a BSC for home use. Patient confirms that he has received 2 Wheeled rolling walker.  I have asked John with [employer] to arrange for patient to have [facility] for home use.    I have made Kandee Keen with [facility] aware that patient would be a discharge for today.  Frances Furbish will provide PT/OT services on discharge.    I have informed staff nurse of the above information.      Patient Goals and CMS Choice CMS Medicare.gov Compare Post Acute Care list provided to:: Patient Choice offered to / list presented to : Patient  Discharge Placement                    Name of family member notified: Mrs. Gombos Patient and family notified of of transfer: 12/25/22  Discharge Plan and Services Additional resources added to the After Visit Summary for                  DME Arranged: Bedside commode, Walker rolling DME Agency: [employer] Date DME Agency Contacted: 12/25/22   Representative spoke with at DME Agency: Jonny Ruiz HH Arranged: PT, OT HH Agency: [facility] Date [facility] Agency Contacted:  Kandee Keen informed of discharge today.)   Representative spoke with at [facility] Agency: Kandee Keen  Social Determinants of Health (SDOH) Interventions SDOH Screenings   Food Insecurity: No Food Insecurity (12/16/2022)  Housing: Low Risk  (12/16/2022)  Transportation Needs: No Transportation Needs (12/16/2022)  Utilities: Not At Risk (12/16/2022)  Tobacco Use: Medium Risk (12/19/2022)     Readmission Risk Interventions    12/17/2022   11:32 AM  Readmission Risk Prevention Plan  Transportation Screening Complete  Home Care Screening Complete  Medication Review (RN  CM) Complete

## 2022-12-25 NOTE — Progress Notes (Signed)
[facility]  ROUNDING NOTE   Subjective:   Pateint was last seen in nephrology office on 9/12 for work up for worsening renal function and nephrotic range proteinuria and hematuria.  Patient seen sitting up in chair Fully dressed awaiting discharge  Remains on room air Wife at bedside  Creatinine 3.41 (3.71) (3.41) Urine output 1.6 L  Objective:  Vital signs in last 24 hours:  Temp:  [97.9 F (36.6 C)-98.5 F (36.9 C)] 97.9 F (36.6 C) (11/07 0743) Pulse Rate:  [76-91] 76 (11/07 0743) Resp:  [18] 18 (11/07 0743) BP: (99-114)/(42-61) 108/56 (11/07 0743) SpO2:  [94 %-100 %] 100 % (11/07 0743) Weight:  [70.5 kg] 70.5 kg (11/07 0322)  Weight change:  Filed Weights   12/17/22 0826 12/24/22 1840 12/25/22 0322  Weight: 63.8 kg 70.5 kg 70.5 kg    Intake/Output: I/O last 3 completed shifts: In: 950 [P.O.:250; Other:700] Out: 2800 [Urine:2800]   Intake/Output this shift:  Total I/O In: -  Out: 1100 [Urine:1100]  Physical Exam: General: NAD  Head: Normocephalic, atraumatic. Moist oral mucosal membranes  Eyes: Anicteric  Lungs:  Clear to auscultation, normal effort  Heart: Regular rate and rhythm  Abdomen:  Soft, nontender  Extremities:  no peripheral edema.  Neurologic: Alert and oriented  Skin: No lesions     GU Foley catheter  Basic Metabolic Panel: Recent Labs  Lab 12/20/22 0352 12/21/22 0341 12/22/22 0413 12/23/22 0411 12/24/22 0431  NA 133* 134* 137 135 134*  K 3.9 4.2 4.2 3.9 4.5  CL 96* 95* 96* 98 100  CO2 28 31 31 28 27   GLUCOSE 137* 125* 102* 117* 99  BUN 97* 90* 79* 77* 72*  CREATININE 5.30* 4.60* 4.08* 3.71* 3.41*  CALCIUM 8.1* 8.2* 8.3* 8.4* 8.3*    Liver Function Tests: Recent Labs  Lab 12/24/22 0431  AST 23  ALT 23  ALKPHOS 45  BILITOT 0.7  PROT 6.7  ALBUMIN 2.7*   No results for input(s): "LIPASE", "AMYLASE" in the last 168 hours. No results for input(s): "AMMONIA" in the last 168 hours.  CBC: Recent Labs  Lab  12/20/22 0352 12/21/22 0341 12/22/22 0413 12/23/22 0411 12/24/22 0431  WBC 9.0 8.9 9.2 8.8 9.6  NEUTROABS 7.0 6.5 6.5 6.3 7.1  HGB 7.2* 7.7* 7.6* 7.1* 7.2*  HCT 21.8* 23.3* 23.3* 21.8* 22.1*  MCV 96.5 96.7 98.3 99.1 98.7  PLT 210 225 238 226 229    Cardiac Enzymes: No results for input(s): "CKTOTAL", "CKMB", "CKMBINDEX", "TROPONINI" in the last 168 hours.  BNP: Invalid input(s): "POCBNP"  CBG: No results for input(s): "GLUCAP" in the last 168 hours.     Coagulation Studies: No results for input(s): "LABPROT", "INR" in the last 72 hours.   Urinalysis: No results for input(s): "COLORURINE", "LABSPEC", "PHURINE", "GLUCOSEU", "HGBUR", "BILIRUBINUR", "KETONESUR", "PROTEINUR", "UROBILINOGEN", "NITRITE", "LEUKOCYTESUR" in the last 72 hours.  Invalid input(s): "APPERANCEUR"     Imaging: No results found.   Medications:      aspirin EC  81 mg Oral Daily   atorvastatin  10 mg Oral Daily   Chlorhexidine Gluconate Cloth  6 each Topical Daily   feeding supplement  237 mL Oral TID BM   loratadine  10 mg Oral Daily   multivitamin with minerals  1 tablet Oral Daily   senna-docusate  1 tablet Oral BID   silodosin  8 mg Oral Q breakfast   simethicone  80 mg Oral QID   fluticasone, melatonin  Assessment/ Plan:  Mr. Hence Derrick  Kendrick is a 77 y.o.  male with hypertension, AAA, hyperlipidemia, coronary artery disease status post CABG, bladder cancer, glaucoma who presents to [facility] with Hyperkalemia [E87.5] Hyponatremia [E87.1] AKI (acute kidney injury) (HCC) [N17.9] Anemia, unspecified type [D64.9] Acute renal failure superimposed on chronic kidney disease, unspecified acute renal failure type, unspecified CKD stage (HCC) [N17.9, N18.9]  Acute Kidney Injury with hyperkalemia on Chronic kidney disease stage IV with proteinuria: concern for underlying multiple myeloma and cast nephropathy.  Hydronephrosis noted on renal ultrasound. -Myeloma kidney less  likely as serum creatinine has improved significantly after Foley catheter placement. -  urology will follow-up outpatient with voiding trial. -Will consider renal biopsy outpatient due to presence of hydronephrosis.  -Patient will receive follow up appt in our office in 1-2 weeks  Hyponatremia: Possibly from AKI. - Sodium 134.  Appetite improving  BPH/obstructive uropathy/hydronephrosis  -Continue silodosin at discharge.  Patient has not tolerated tamsulosin in the past. Urology will follow outpatient  Anemia with chronic kidney disease: with leukocytosis. Concerning for multiple myeloma or other blood cell dyscrasia.  Hematology will continue to follow this patient outpatient.    LOS: 9 [name] 11/7/20244:17 PM

## 2022-12-25 NOTE — Care Management Important Message (Signed)
Important Message  Patient Details  Name: [name] MRN: [id number] Date of Birth: April 04, 1945   Important Message Given:  Yes - Medicare IM     Olegario Messier A Kaesen Rodriguez 12/25/2022, 9:29 AM

## 2022-12-25 NOTE — Plan of Care (Signed)
  Problem: Nutrition: Goal: Adequate nutrition will be maintained Outcome: Progressing   Problem: Activity: Goal: Risk for activity intolerance will decrease Outcome: Progressing   Problem: Safety: Goal: Ability to remain free from injury will improve Outcome: Progressing   

## 2022-12-26 ENCOUNTER — Encounter: Payer: Medicare Other | Admitting: Physician Assistant

## 2022-12-26 ENCOUNTER — Other Ambulatory Visit: Payer: Medicare Other

## 2022-12-26 ENCOUNTER — Encounter: Payer: Self-pay | Admitting: Oncology

## 2022-12-29 ENCOUNTER — Encounter: Payer: Medicare Other | Admitting: Physician Assistant

## 2022-12-30 ENCOUNTER — Encounter: Payer: Self-pay | Admitting: Oncology

## 2022-12-30 ENCOUNTER — Inpatient Hospital Stay: Payer: Medicare Other

## 2022-12-30 ENCOUNTER — Inpatient Hospital Stay (HOSPITAL_BASED_OUTPATIENT_CLINIC_OR_DEPARTMENT_OTHER): Payer: Medicare Other | Admitting: Oncology

## 2022-12-30 ENCOUNTER — Inpatient Hospital Stay: Payer: Medicare Other | Attending: Radiation Oncology

## 2022-12-30 ENCOUNTER — Encounter: Payer: Medicare Other | Admitting: Physician Assistant

## 2022-12-30 ENCOUNTER — Telehealth: Payer: Self-pay | Admitting: *Deleted

## 2022-12-30 VITALS — BP 80/51 | HR 65 | Temp 98.5°F | Resp 20 | Wt 151.8 lb

## 2022-12-30 DIAGNOSIS — Z299 Encounter for prophylactic measures, unspecified: Secondary | ICD-10-CM

## 2022-12-30 DIAGNOSIS — Z8546 Personal history of malignant neoplasm of prostate: Secondary | ICD-10-CM | POA: Diagnosis not present

## 2022-12-30 DIAGNOSIS — Z8042 Family history of malignant neoplasm of prostate: Secondary | ICD-10-CM | POA: Diagnosis not present

## 2022-12-30 DIAGNOSIS — D631 Anemia in chronic kidney disease: Secondary | ICD-10-CM

## 2022-12-30 DIAGNOSIS — Z23 Encounter for immunization: Secondary | ICD-10-CM

## 2022-12-30 DIAGNOSIS — Z87891 Personal history of nicotine dependence: Secondary | ICD-10-CM | POA: Insufficient documentation

## 2022-12-30 DIAGNOSIS — R7989 Other specified abnormal findings of blood chemistry: Secondary | ICD-10-CM

## 2022-12-30 DIAGNOSIS — N184 Chronic kidney disease, stage 4 (severe): Secondary | ICD-10-CM | POA: Insufficient documentation

## 2022-12-30 DIAGNOSIS — R768 Other specified abnormal immunological findings in serum: Secondary | ICD-10-CM

## 2022-12-30 DIAGNOSIS — R5383 Other fatigue: Secondary | ICD-10-CM | POA: Diagnosis not present

## 2022-12-30 DIAGNOSIS — C679 Malignant neoplasm of bladder, unspecified: Secondary | ICD-10-CM

## 2022-12-30 LAB — COMPREHENSIVE METABOLIC PANEL
ALT: 17 U/L (ref 0–44)
AST: 21 U/L (ref 15–41)
Albumin: 3.1 g/dL — ABNORMAL LOW (ref 3.5–5.0)
Alkaline Phosphatase: 51 U/L (ref 38–126)
Anion gap: 8 (ref 5–15)
BUN: 52 mg/dL — ABNORMAL HIGH (ref 8–23)
CO2: 21 mmol/L — ABNORMAL LOW (ref 22–32)
Calcium: 8.4 mg/dL — ABNORMAL LOW (ref 8.9–10.3)
Chloride: 106 mmol/L (ref 98–111)
Creatinine, Ser: 2.81 mg/dL — ABNORMAL HIGH (ref 0.61–1.24)
GFR, Estimated: 22 mL/min — ABNORMAL LOW (ref >60)
Glucose, Bld: 102 mg/dL — ABNORMAL HIGH (ref 70–99)
Potassium: 4.7 mmol/L (ref 3.5–5.1)
Sodium: 135 mmol/L (ref 135–145)
Total Bilirubin: 0.5 mg/dL (ref <1.2)
Total Protein: 7.2 g/dL (ref 6.5–8.1)

## 2022-12-30 LAB — CBC WITH DIFFERENTIAL/PLATELET
Abs Immature Granulocytes: 0.02 10*3/uL (ref 0.00–0.07)
Basophils Absolute: 0.1 10*3/uL (ref 0.0–0.1)
Basophils Relative: 1 %
Eosinophils Absolute: 0.2 10*3/uL (ref 0.0–0.5)
Eosinophils Relative: 3 %
HCT: 23.1 % — ABNORMAL LOW (ref 39.0–52.0)
Hemoglobin: 7.2 g/dL — ABNORMAL LOW (ref 13.0–17.0)
Immature Granulocytes: 0 %
Lymphocytes Relative: 12 %
Lymphs Abs: 0.9 10*3/uL (ref 0.7–4.0)
MCH: 32.4 pg (ref 26.0–34.0)
MCHC: 31.2 g/dL (ref 30.0–36.0)
MCV: 104.1 fL — ABNORMAL HIGH (ref 80.0–100.0)
Monocytes Absolute: 0.8 10*3/uL (ref 0.1–1.0)
Monocytes Relative: 10 %
Neutro Abs: 5.7 10*3/uL (ref 1.7–7.7)
Neutrophils Relative %: 74 %
Platelets: 309 10*3/uL (ref 150–400)
RBC: 2.22 MIL/uL — ABNORMAL LOW (ref 4.22–5.81)
RDW: 14 % (ref 11.5–15.5)
WBC: 7.6 10*3/uL (ref 4.0–10.5)
nRBC: 0 % (ref 0.0–0.2)

## 2022-12-30 MED ORDER — INFLUENZA VAC A&B SURF ANT ADJ 0.5 ML IM SUSY
0.5000 mL | PREFILLED_SYRINGE | Freq: Once | INTRAMUSCULAR | Status: AC
  Administered 2022-12-30: 0.5 mL via INTRAMUSCULAR
  Filled 2022-12-30: qty 0.5

## 2022-12-30 MED ORDER — INFLUENZA VAC A&B SURF ANT ADJ 0.5 ML IM SUSY: 0.5000 mL | PREFILLED_SYRINGE | Freq: Once | INTRAMUSCULAR | Status: DC

## 2022-12-30 MED ORDER — EPOETIN ALFA-EPBX 40000 UNIT/ML IJ SOLN
40000.0000 [IU] | INTRAMUSCULAR | Status: DC
  Administered 2022-12-30: 40000 [IU] via SUBCUTANEOUS
  Filled 2022-12-30 (×2): qty 1

## 2022-12-30 NOTE — Progress Notes (Signed)
Hematology/Oncology Consult note [facility]  [phone] [phone]  Patient Care Team: [name], MD as PCP - General (Internal Medicine) Carmina Miller, MD as Radiation Oncologist (Radiation Oncology)   Name of the patient: [name]  [id number]  04/19/1945   Date of visit: 12/30/22  Diagnosis-concern for possible multiple myeloma Superficial bladder cancer  Chief complaint/ Reason for visit-posthospital discharge follow-up  Heme/Onc history: Patient is a 77 year old male with history of superficial bladder cancer was last seen by me in November 2022. At that time he completed 6 weekly cycles of intravesical gemcitabine chemotherapy and was subsequently followed up by urology. He has been having concerns of radiation cystitis for which she has been undergoing hyperbaric oxygen therapy. He followed up with Dr. Wynelle Link from nephrology for CKD when he was found to have a creatinine of 2.3. At that time he was found to have a creatinine of 2.9 with an H&H of 10.2/31.2. Possible M protein noted on SPEP and labs also showed elevated lambda light chain of 403 with a free light chain ratio of 0.11   Patient was hospitalized on 12/16/2022 after he was found to have acute kidney injury with a creatinine of 9.  Given the concern for multiple myeloma patient underwent PET scan as an inpatient which does not show any evidence of lytic lesions.He underwent ultrasound renal which showed bilateral hydronephrosis left greater than right which was chronic as compared to February 2024 and therefore urology did not feel that any nephrostomy tube or stent was warranted.  Patient was given IV fluids and Foley catheter was placed with significant improvement in his renal functions.  Bone marrow biopsy on10/30/2024 showed lambda restricted plasma cell neoplasm involving 15% of the marrow.  Karyotype was normal.  Myeloma FISH panel showed gain/duplication  of 1 q.  Interval history-patient still has a Foley catheter in place and overall feels significantly better since he was hospitalized.  He still has ongoing fatigue.  ECOG PS- 2 Pain scale- 0   Review of systems- Review of Systems  Constitutional:  Positive for malaise/fatigue. Negative for chills, fever and weight loss.  HENT:  Negative for congestion, ear discharge and nosebleeds.   Eyes:  Negative for blurred vision.  Respiratory:  Negative for cough, hemoptysis, sputum production, shortness of breath and wheezing.   Cardiovascular:  Negative for chest pain, palpitations, orthopnea and claudication.  Gastrointestinal:  Negative for abdominal pain, blood in stool, constipation, diarrhea, heartburn, melena, nausea and vomiting.  Genitourinary:  Negative for dysuria, flank pain, frequency, hematuria and urgency.  Musculoskeletal:  Negative for back pain, joint pain and myalgias.  Skin:  Negative for rash.  Neurological:  Negative for dizziness, tingling, focal weakness, seizures, weakness and headaches.  Endo/Heme/Allergies:  Does not bruise/bleed easily.  Psychiatric/Behavioral:  Negative for depression and suicidal ideas. The patient does not have insomnia.       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 4v CABG on 04/13/2002. b.) LHC 02/03/2013 --> LVEF 50%; severe 3v CAD with  patent LIMA-LAD and SVG-OM1 grafts; SVG-PDA and SVG-DG1 occluded with collaterals to PDA and DG1 from LAD.   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   a.) LIMA-LAD, SVG-OM1, SVG-D1, SVG-PDA   Tubular adenoma of colon 02/03/2007     Past Surgical History:  Procedure Laterality Date   CARDIAC CATHETERIZATION Left 04/13/2002   Procedure: CARDIAC CATHETERIZATION; Location: [facility]; Surgeon; Marcina Millard, MD   CARDIAC CATHETERIZATION Left 02/03/2013   Procedure: CARDIAC CATHETERIZATION; Location: [facility]; Surgeon: Marcina Millard, MD   COLONOSCOPY W/ POLYPECTOMY     COLONOSCOPY WITH PROPOFOL N/A 04/09/2015   Procedure: COLONOSCOPY WITH PROPOFOL;  Surgeon: Wallace Cullens, MD;  Location: [facility] ENDOSCOPY;  Service: Gastroenterology;  Laterality: N/A;   CORONARY ANGIOPLASTY     CORONARY ARTERY BYPASS GRAFT N/A 04/13/2002   Procedure: 4v CABG (LIMA-LAD, SVG-OM1, SVG-D1, SVG-PDA); Location: [facility]; Surgeon: Rana Snare, MD   CYSTOSCOPY N/A 07/17/2021   Procedure: Bluford Kaufmann;  Surgeon: [name], MD;  Location: [facility] ORS;  Service: Urology;  Laterality: N/A;   CYSTOSCOPY N/A 06/24/2022   Procedure: DIAGNOSTIC CYSTOSCOPY;  Surgeon: [name], MD;  Location: [facility] ORS;  Service: Urology;  Laterality: N/A;   CYSTOSCOPY WITH BIOPSY N/A 07/17/2021   Procedure: CYSTOSCOPY WITH BIOPSY;  Surgeon: [name], MD;  Location: [facility] ORS;  Service: Urology;  Laterality: N/A;   CYSTOSCOPY WITH BIOPSY N/A 06/24/2022   Procedure: CYSTOSCOPY WITH BLADDER BIOPSY;  Surgeon: [name], MD;  Location: [facility] ORS;  Service: Urology;  Laterality: N/A;   CYSTOSCOPY WITH FULGERATION N/A 07/17/2021   Procedure: CYSTOSCOPY WITH FULGERATION;  Surgeon: [name], MD;  Location: [facility] ORS;  Service:  Urology;  Laterality: N/A;   CYSTOSCOPY WITH FULGERATION N/A 06/24/2022   Procedure: CYSTOSCOPY WITH FULGERATION;  Surgeon: [name], MD;  Location: [facility] OR[facility];  Service: Urology;  Laterality: N/A;   EYE SURGERY     both eyes with cataract with lens 2021   IR BONE MARROW BIOPSY & ASPIRATION  12/17/2022   TONSILLECTOMY     TRANSURETHRAL RESECTION OF BLADDER TUMOR N/A 11/13/2020   Procedure: TRANSURETHRAL RESECTION OF BLADDER TUMOR (TURBT);  Surgeon: [name], MD;  Location: [facility] ORS;  Service: Urology;  Laterality: N/A;   TRANSURETHRAL RESECTION OF BLADDER TUMOR N/A 06/24/2022   Procedure: TRANSURETHRAL RESECTION OF BLADDER TUMOR (TURBT);  Surgeon: [name], MD;  Location: [facility] ORS;  Service: Urology;  Laterality: N/A;   TRANSURETHRAL RESECTION OF BLADDER TUMOR WITH GYRUS (TURBT-GYRUS)  06/24/2022   TRANSURETHRAL RESECTION OF PROSTATE      Social History   Socioeconomic History   Marital status: Married    Spouse name: [name]   Number of children: Not on file   Years of education: Not on file   Highest education level: Not on file  Occupational History   Not on file  Tobacco Use   Smoking status: Former    Current packs/day: 0.00    Average  packs/day: 0.8 packs/day for 60.0 years (45.0 ttl pk-yrs)    Types: Cigarettes    Start date: 85    Quit date: 2008    Years since quitting: 16.8    Passive exposure: Past   Smokeless tobacco: Never  Substance and Sexual Activity   Alcohol use: Yes    Alcohol/week: 2.0 standard drinks of alcohol    Types: 2 Standard drinks or equivalent per week    Comment: 14/week   Drug use: No   Sexual activity: Not on file  Other Topics Concern   Not on file  Social History Narrative   Not on file   Social Determinants of Health   Financial Resource Strain: Not on file  Food Insecurity: No Food Insecurity (12/16/2022)   Hunger Vital Sign    Worried About Running Out of Food in the Last Year: Never true    Ran Out of  Food in the Last Year: Never true  Transportation Needs: No Transportation Needs (12/16/2022)   PRAPARE - Administrator, Civil Service (Medical): No    Lack of Transportation (Non-Medical): No  Physical Activity: Not on file  Stress: Not on file  Social Connections: Not on file  Intimate Partner Violence: Not At Risk (12/16/2022)   Humiliation, Afraid, Rape, and Kick questionnaire    Fear of Current or Ex-Partner: No    Emotionally Abused: No    Physically Abused: No    Sexually Abused: No    Family History  Problem Relation Age of Onset   Prostate cancer Father        Metastatic   Cancer Father      Current Outpatient Medications:    acetaminophen (TYLENOL) 500 MG tablet, Take 500 mg by mouth every 6 (six) hours as needed., Disp: , Rfl:    aspirin 81 MG tablet, Take 81 mg by mouth daily., Disp: , Rfl:    atorvastatin (LIPITOR) 10 MG tablet, Take 10 mg by mouth daily., Disp: , Rfl:    cetirizine (ZYRTEC) 10 MG tablet, Take 10 mg by mouth at bedtime., Disp: , Rfl:    Coenzyme Q10 100 MG capsule, Take 100 mg by mouth daily. , Disp: , Rfl:    feeding supplement (ENSURE ENLIVE / ENSURE PLUS) LIQD, Take 237 mLs by mouth 3 (three) times daily between meals., Disp: , Rfl:    ferrous sulfate 324 MG TBEC, Take 324 mg by mouth., Disp: , Rfl:    fluticasone (FLONASE) 50 MCG/ACT nasal spray, Place 1 spray into both nostrils as needed., Disp: , Rfl:    GLUCOSAMINE-CHONDROITIN PO, Take 1 tablet by mouth 2 (two) times daily., Disp: , Rfl:    Magnesium 200 MG TABS, Take 200 mg by mouth 2 (two) times daily., Disp: , Rfl:    metoprolol tartrate (LOPRESSOR) 25 MG tablet, Take 25 mg by mouth 2 (two) times daily., Disp: , Rfl:    metroNIDAZOLE (METROGEL) 0.75 % gel, Apply 1 application topically 2 (two) times daily., Disp: , Rfl:    Multiple Vitamin (MULTIVITAMIN) tablet, Take 1 tablet by mouth daily., Disp: , Rfl:    nitroGLYCERIN (NITROSTAT) 0.4 MG SL tablet, Place under the tongue.,  Disp: , Rfl:    Omega-3 Fatty Acids (FISH OIL) 1000 MG CAPS, Take 1,000 mg by mouth daily., Disp: , Rfl:    Probiotic Product (MEGA PROBIOTIC) CAPS, Take 1 capsule by mouth daily., Disp: , Rfl:    senna-docusate (SENOKOT-S) 8.6-50 MG tablet, Take 1 tablet by mouth 2 (  two) times daily as needed for mild constipation., Disp: 30 tablet, Rfl: 0   simethicone (MYLICON) 80 MG chewable tablet, Chew 1 tablet (80 mg total) by mouth 4 (four) times daily as needed for flatulence., Disp: 30 tablet, Rfl: 0   Vitamin A 2400 MCG (8000 UT) CAPS, Take 8,000 Units by mouth daily., Disp: , Rfl:    vitamin B-12 (CYANOCOBALAMIN) 1000 MCG tablet, Take 1,000 mcg by mouth daily., Disp: , Rfl:    vitamin C (ASCORBIC ACID) 250 MG tablet, Take 250 mg by mouth 2 (two) times daily., Disp: , Rfl:    zinc gluconate 50 MG tablet, Take 50 mg by mouth daily., Disp: , Rfl:    silodosin (RAPAFLO) 8 MG CAPS capsule, Take 1 capsule (8 mg total) by mouth daily with breakfast. (Patient not taking: Reported on 12/19/2022), Disp: 30 capsule, Rfl: 11 No current facility-administered medications for this visit.  Facility-Administered Medications Ordered in Other Visits:    epoetin alfa-epbx (RETACRIT) injection 40,000 Units, 40,000 Units, Subcutaneous, Q21 days, Creig Hines, MD, 40,000 Units at 12/30/22 1223  Physical exam:  Vitals:   12/30/22 1127 12/30/22 1133  BP: (!) 77/44 (!) 80/51  Pulse: 65   Resp: 20   Temp: 98.5 F (36.9 C)   SpO2: 100%   Weight: 151 lb 12.8 oz (68.9 kg)    Physical Exam Constitutional:      Comments: Ambulates with a walker.  Appears in no acute distress  Cardiovascular:     Rate and Rhythm: Normal rate and regular rhythm.     Heart sounds: Normal heart sounds.  Pulmonary:     Effort: Pulmonary effort is normal.     Breath sounds: Normal breath sounds.  Abdominal:     General: Bowel sounds are normal.     Palpations: Abdomen is soft.     Comments: Foley catheter in place  Skin:    General:  Skin is warm and dry.  Neurological:     Mental Status: He is alert and oriented to person, place, and time.         Latest Ref Rng & Units 12/30/2022   10:57 AM  CMP  Glucose 70 - 99 mg/dL 073   BUN 8 - 23 mg/dL 52   Creatinine 7.10 - 1.24 mg/dL 6.26   Sodium 948 - 546 mmol/L 135   Potassium 3.5 - 5.1 mmol/L 4.7   Chloride 98 - 111 mmol/L 106   CO2 22 - 32 mmol/L 21   Calcium 8.9 - 10.3 mg/dL 8.4   Total Protein 6.5 - 8.1 g/dL 7.2   Total Bilirubin <2.7 mg/dL 0.5   Alkaline Phos 38 - 126 U/L 51   AST 15 - 41 U/L 21   ALT 0 - 44 U/L 17       Latest Ref Rng & Units 12/30/2022   10:57 AM  CBC  WBC 4.0 - 10.5 K/uL 7.6   Hemoglobin 13.0 - 17.0 g/dL 7.2   Hematocrit 03.5 - 52.0 % 23.1   Platelets 150 - 400 K/uL 309     No images are attached to the encounter.  DG Abd 1 View  Result Date: 12/20/2022 CLINICAL DATA:  Constipation EXAM: ABDOMEN - 1 VIEW COMPARISON:  None Available. FINDINGS: Nonobstructive bowel gas pattern. Small stool burden. No gross free intraperitoneal gas. No organomegaly. Vascular calcifications are seen within the abdomen and pelvis. Osseous structures are age-appropriate. No acute bone abnormality IMPRESSION: 1. Nonobstructive bowel gas pattern.  Small stool burden.  Electronically Signed   By: Helyn Numbers M.D.   On: 12/20/2022 17:16   NM PET Image Initial (PI) Whole Body (F-18 FDG)  Result Date: 12/17/2022 CLINICAL DATA:  Initial treatment strategy for multiple myeloma. Prior history of bladder carcinoma and prostate carcinoma. EXAM: NUCLEAR MEDICINE PET WHOLE BODY TECHNIQUE: 7.8 mCi F-18 FDG was injected intravenously. Full-ring PET imaging was performed from the head to foot after the radiotracer. CT data was obtained and used for attenuation correction and anatomic localization. Fasting blood glucose: 97 mg/dl COMPARISON:  None Available. FINDINGS: Mediastinal blood-pool activity (background): SUV max = 2.7 Liver activity (reference): SUV max = N/A  NECK:  No hypermetabolic lymph nodes or masses. Incidental CT findings:  None. CHEST: No hypermetabolic lymph nodes. No suspicious pulmonary nodules seen on CT images. Incidental CT findings:  None. ABDOMEN/PELVIS: No abnormal hypermetabolic activity within the liver, pancreas, adrenal glands, or spleen. No hypermetabolic lymph nodes in the abdomen or pelvis. Incidental CT findings: Mildly enlarged prostate gland. Foley catheter seen within bladder. Diffuse bladder wall thickening is seen, likely due to chronic bladder outlet obstruction or cystitis. Mild moderate bilateral hydroureteronephrosis is seen to the level of the bladder, likely due to vesicoureteral reflux. SKELETON: No focal lytic or sclerotic bone lesions are seen on CT images. No focal hypermetabolic bone lesions. Incidental CT findings:  None. EXTREMITIES: No focal hypermetabolic bone lesions identified. No focal lytic or sclerotic bone lesion seen on CT images. Incidental CT findings: none IMPRESSION: No focal hypermetabolic or lytic bone lesions identified. Electronically Signed   By: Danae Orleans M.D.   On: 12/17/2022 15:39   IR BONE MARROW BIOPSY & ASPIRATION  Result Date: 12/17/2022 INDICATION: Multiple myeloma workup EXAM: Bone marrow aspiration and core biopsy using fluoroscopic guidance MEDICATIONS: None. ANESTHESIA/SEDATION: Moderate (conscious) sedation was employed during this procedure. A total of Versed 0.5 mg and Fentanyl 25 mcg was administered intravenously. Moderate Sedation Time: 10 minutes. The patient's level of consciousness and vital signs were monitored continuously by radiology nursing throughout the procedure under my direct supervision. FLUOROSCOPY TIME:  Fluoroscopy Time: 0.8 minutes (3 mGy) COMPLICATIONS: None immediate. PROCEDURE: Informed written consent was obtained from the patient after a thorough discussion of the procedural risks, benefits and alternatives. All questions were addressed. Maximal Sterile Barrier  Technique was utilized including caps, mask, sterile gowns, sterile gloves, sterile drape, hand hygiene and skin antiseptic. A timeout was performed prior to the initiation of the procedure. The patient was placed prone on the exam table. Limited fluoroscopy of the pelvis was performed for planning purposes. Skin entry site was marked, and the overlying skin was prepped and draped in the standard sterile fashion. Local analgesia was obtained with 1% lidocaine. Using fluoroscopic guidance, an 11 gauge needle was advanced just deep to the cortex of the right posterior ilium. Subsequently, bone marrow aspiration and core biopsy were performed. Specimens were submitted to lab/pathology for handling. Hemostasis was achieved with manual pressure, and a clean dressing was placed. The patient tolerated the procedure well without immediate complication. IMPRESSION: Successful bone marrow aspiration and core biopsy of the right posterior ilium using fluoroscopic guidance. Electronically Signed   By: Olive Bass M.D.   On: 12/17/2022 11:54   US Renal  Result Date: 12/16/2022 CLINICAL DATA:  Acute on chronic renal failure. EXAM: RENAL / URINARY TRACT ULTRASOUND COMPLETE COMPARISON:  April 17, 2022 FINDINGS: Right Kidney: Renal measurements: 9.7 cm x 5.8 cm x 5.0 cm = volume: 146 mL. There is diffusely increased echogenicity  of the renal parenchyma. A 2.6 cm x 2.5 cm x 3.0 cm and 2.5 cm x 1.9 cm x 2.1 cm cysts are seen within the right kidney. There is mild right-sided hydronephrosis. Left Kidney: Renal measurements: 10.6 cm x 6.2 cm x 6.0 cm = volume: 207 mL. There is diffusely increased echogenicity of the renal parenchyma. 1.7 cm x 1.6 cm x 2.0 cm and 4.3 cm x 3.7 cm x 5.4 cm simple cysts are seen within the left kidney. There is moderate severity left-sided hydronephrosis. Bladder: A Foley catheter is present. Other: None. IMPRESSION: 1. Bilateral echogenic kidneys, likely secondary to medical renal disease. 2.  Multiple large, bilateral simple renal cysts. 3. Bilateral hydronephrosis, left greater than right. Electronically Signed   By: Aram Candela M.D.   On: 12/16/2022 21:01     Assessment and plan- Patient is a 77 y.o. male referred for concerns of multiple myeloma  One of the criteria for diagnosis of multiple myeloma is greater than 10% clonal plasma cells in the bone marrow along with evidence of crab criteria.  Patient does not have any evidence of hypercalcemia or lytic bony lesions.  He has 15% clonal plasma cells in the bone marrow and evidence of renal failure and anemia.  His kidney functions are improving after he was hospitalized and given IV fluids and Foley catheter placement.  He has an outpatient follow-up with urology for trial of void.  At present it is unclear if his anemia is driven by his kidney injury and if his kidney functions are deranged because of myeloma versus other etiology.  Patient has not received any treatment for myeloma as such and despite that his kidney functions improved just with IV fluids.  Ideally I had recommended that patient should get kidney biopsy to see if there is any evidence of myeloma kidney before treating him as myeloma as such.  However given the presence of bilateral hydronephrosis interventional radiology here at [facility] does not wish to do a kidney biopsy given the inherent risks.  I would therefore like him to be referred to [facility] myeloma clinic for second opinion to see if we can categorize him as overt myeloma at this time.  Although patient's freeLight chain ratio was elevated at 13 with a lambda free light chain of 948 and kappa free light chain of 68.2 respectively these ratios can be altered in patients with renal disease.  I have checked a repeat free light chain at this time.  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  I do feel that his anemia is Partly driven by his kidney disease as well and therefore starting him on EPO for anemia of kidney disease.  Discussed risks and benefits of EPO including all but not limited to possible risk of thromboembolic events and a goal to keep his hemoglobin between 10 and 11.  He will receive his first dose of Retacrit today and will continue to receive Retacrit every 3 weeks.  I will see him back in 9 weeks with CBC with differential CMP myeloma panel and serum free light chains and hopefully he would have been seen by [facility] by then as well.   Visit Diagnosis 1. Need for prophylactic measure   2. Encounter for immunization   3. Anemia in stage  4 chronic kidney disease (HCC)      Dr. Owens Shark, MD, MPH [facility] [phone] 12/30/2022 12:41 PM

## 2022-12-30 NOTE — Telephone Encounter (Signed)
Faxed the appropriate information for Dr. Leeanne Deed to see about pt myeloma dx. All 55 papers transmission went through. Also [name] spoke to Dr. Leeanne Deed about the pt. also

## 2022-12-30 NOTE — Addendum Note (Signed)
Addended by: Corene Cornea on: 12/30/2022 12:19 PM   Modules accepted: Orders

## 2022-12-31 ENCOUNTER — Encounter: Payer: Medicare Other | Admitting: Internal Medicine

## 2022-12-31 ENCOUNTER — Ambulatory Visit: Payer: Medicare Other | Admitting: Physician Assistant

## 2022-12-31 LAB — KAPPA/LAMBDA LIGHT CHAINS
Kappa free light chain: 29.7 mg/L — ABNORMAL HIGH (ref 3.3–19.4)
Kappa, lambda light chain ratio: 0.06 — ABNORMAL LOW (ref 0.26–1.65)
Lambda free light chains: 478.7 mg/L — ABNORMAL HIGH (ref 5.7–26.3)

## 2023-01-01 ENCOUNTER — Encounter: Payer: Medicare Other | Admitting: Physician Assistant

## 2023-01-02 ENCOUNTER — Encounter: Payer: Medicare Other | Admitting: Physician Assistant

## 2023-01-05 ENCOUNTER — Encounter: Payer: Medicare Other | Admitting: Physician Assistant

## 2023-01-05 NOTE — Progress Notes (Signed)
01/07/2023 12:56 PM   [name] [birth year] [id number]  Referring provider: [name], MD [address] [facility] [address] [facility] [place],  [place] [address]  Urological history: 1. 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  Intermediate risk adenocarcinoma prostate -PSA (09/2022) - 0.13 Treated IMRT+ ADT  3.  Radiation cystitis -Undergoing hyperbaric oxygen therapy   Chief Complaint  Patient presents with   Follow-up   HPI: [name] is a 77 y.o. male who presents today for hospital follow up with his wife, [name].    Previous records reviewed.   He was recently admitted for an AKI and was found to have bilateral hydronephrosis secondary to urinary retention and a Foley catheter was placed.  He has also been referred to [facility] myeloma clinic for second opinion to see if he can be categorized as having overt myeloma at this time.  His Foley was removed this morning.  He states he has been voiding well.  Patient denies any modifying or aggravating factors.  Patient denies any recent UTI's, gross hematuria, dysuria or suprapubic/flank pain.  Patient denies any fevers, chills, nausea or vomiting.    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 4v CABG on 04/13/2002. b.) LHC 02/03/2013 --> LVEF 50%; severe 3v CAD with patent LIMA-LAD and SVG-OM1 grafts; SVG-PDA and SVG-DG1 occluded with collaterals to PDA and DG1 from LAD.   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   a.) LIMA-LAD, SVG-OM1, SVG-D1, SVG-PDA   Tubular adenoma of colon 02/03/2007    Surgical History: Past Surgical History:  Procedure Laterality Date   CARDIAC CATHETERIZATION Left 04/13/2002   Procedure: CARDIAC CATHETERIZATION; Location: [facility]; Surgeon; Marcina Millard, MD   CARDIAC CATHETERIZATION Left 02/03/2013   Procedure: CARDIAC CATHETERIZATION; Location: [facility]; Surgeon: Marcina Millard, MD   COLONOSCOPY W/ POLYPECTOMY     COLONOSCOPY WITH PROPOFOL N/A 04/09/2015   Procedure: COLONOSCOPY WITH PROPOFOL;  Surgeon: Wallace Cullens, MD;  Location: [facility] ENDOSCOPY;  Service: Gastroenterology;  Laterality: N/A;   CORONARY ANGIOPLASTY     CORONARY ARTERY BYPASS GRAFT N/A 04/13/2002   Procedure: 4v CABG (LIMA-LAD, SVG-OM1, SVG-D1, SVG-PDA); Location: [facility]; Surgeon: Rana Snare, MD   CYSTOSCOPY N/A 07/17/2021   Procedure: Bluford Kaufmann;  Surgeon: [name], MD;  Location: [facility] ORS;  Service: Urology;  Laterality: N/A;   CYSTOSCOPY N/A 06/24/2022  Procedure: DIAGNOSTIC CYSTOSCOPY;  Surgeon: [name], MD;  Location: [facility] ORS;  Service: Urology;  Laterality: N/A;   CYSTOSCOPY WITH BIOPSY N/A 07/17/2021   Procedure: CYSTOSCOPY WITH BIOPSY;  Surgeon: [name], MD;  Location: [facility] ORS;  Service: Urology;  Laterality: N/A;   CYSTOSCOPY WITH BIOPSY N/A 06/24/2022   Procedure: CYSTOSCOPY WITH BLADDER BIOPSY;  Surgeon: [name], MD;   Location: [facility] ORS;  Service: Urology;  Laterality: N/A;   CYSTOSCOPY WITH FULGERATION N/A 07/17/2021   Procedure: CYSTOSCOPY WITH FULGERATION;  Surgeon: [name], MD;  Location: [facility] ORS;  Service: Urology;  Laterality: N/A;   CYSTOSCOPY WITH FULGERATION N/A 06/24/2022   Procedure: CYSTOSCOPY WITH FULGERATION;  Surgeon: [name], MD;  Location: [facility] ORS;  Service: Urology;  Laterality: N/A;   EYE SURGERY     both eyes with cataract with lens 2021   IR BONE MARROW BIOPSY & ASPIRATION  12/17/2022   TONSILLECTOMY     TRANSURETHRAL RESECTION OF BLADDER TUMOR N/A 11/13/2020   Procedure: TRANSURETHRAL RESECTION OF BLADDER TUMOR (TURBT);  Surgeon: [name], MD;  Location: [facility] ORS;  Service: Urology;  Laterality: N/A;   TRANSURETHRAL RESECTION OF BLADDER TUMOR N/A 06/24/2022   Procedure: TRANSURETHRAL RESECTION OF BLADDER TUMOR (TURBT);  Surgeon: [name], MD;  Location: [facility] ORS;  Service: Urology;  Laterality: N/A;   TRANSURETHRAL RESECTION OF BLADDER TUMOR WITH GYRUS (TURBT-GYRUS)  06/24/2022   TRANSURETHRAL RESECTION OF PROSTATE      Home Medications:  Allergies as of 01/07/2023       Reactions   Cidex [glutaral] Anaphylaxis   Other Anaphylaxis   - Ortho-phthalaldehyde  - trospion cause AMS        Medication List        Accurate as of January 07, 2023 11:59 PM. If you have any questions, ask your nurse or doctor.          acetaminophen 500 MG tablet Commonly known as: TYLENOL Take 500 mg by mouth every 6 (six) hours as needed.   aspirin 81 MG tablet Take 81 mg by mouth daily.   atorvastatin 10 MG tablet Commonly known as: LIPITOR Take 10 mg by mouth daily.   cetirizine 10 MG tablet Commonly known as: ZYRTEC Take 10 mg by mouth at bedtime.   Coenzyme Q10 100 MG capsule Take 100 mg by mouth daily.   cyanocobalamin 1000 MCG tablet Commonly known as: VITAMIN B12 Take 1,000 mcg by mouth daily.   feeding supplement Liqd Take 237 mLs  by mouth 3 (three) times daily between meals.   ferrous sulfate 324 MG Tbec Take 324 mg by mouth.   Fish Oil 1000 MG Caps Take 1,000 mg by mouth daily.   fluticasone 50 MCG/ACT nasal spray Commonly known as: FLONASE Place 1 spray into both nostrils as needed.   GLUCOSAMINE-CHONDROITIN PO Take 1 tablet by mouth 2 (two) times daily.   isosorbide mononitrate 30 MG 24 hr tablet Commonly known as: IMDUR Take 1 tablet by mouth daily.   Magnesium 200 MG Tabs Take 200 mg by mouth 2 (two) times daily.   Mega Probiotic Caps Take 1 capsule by mouth daily.   metoprolol tartrate 25 MG tablet Commonly known as: LOPRESSOR Take 25 mg by mouth 2 (two) times daily.   metroNIDAZOLE 0.75 % gel Commonly known as: METROGEL Apply 1 application topically 2 (two) times daily.   multivitamin tablet Take 1 tablet by mouth daily.   nitroGLYCERIN 0.4 MG SL  tablet Commonly known as: NITROSTAT Place under the tongue.   senna-docusate 8.6-50 MG tablet Commonly known as: Senokot-S Take 1 tablet by mouth 2 (two) times daily as needed for mild constipation.   silodosin 8 MG Caps capsule Commonly known as: RAPAFLO Take 1 capsule (8 mg total) by mouth daily with breakfast.   simethicone 80 MG chewable tablet Commonly known as: MYLICON Chew 1 tablet (80 mg total) by mouth 4 (four) times daily as needed for flatulence.   Vitamin A 2400 MCG (8000 UT) Caps Take 8,000 Units by mouth daily.   vitamin C 250 MG tablet Commonly known as: ASCORBIC ACID Take 250 mg by mouth 2 (two) times daily.   zinc gluconate 50 MG tablet Take 50 mg by mouth daily.        Allergies:  Allergies  Allergen Reactions   Cidex [Glutaral] Anaphylaxis   Other Anaphylaxis    - Ortho-phthalaldehyde  - trospion cause AMS    Family History: Family History  Problem Relation Age of Onset   Prostate cancer Father        Metastatic   Cancer Father     Social History:  reports that he quit smoking about 16 years  ago. His smoking use included cigarettes. He started smoking about 76 years ago. He has a 45 pack-year smoking history. He has been exposed to tobacco smoke. He has never used smokeless tobacco. He reports current alcohol use of about 2.0 standard drinks of alcohol per week. He reports that he does not use drugs.  ROS: Pertinent ROS in HPI  Physical Exam: Ht 5\' 6"  (1.676 m)   Wt 151 lb (68.5 kg)   BMI 24.37 kg/m   Constitutional:  Well nourished. Alert and oriented, No acute distress. HEENT: [place] AT, moist mucus membranes.  Trachea midline Cardiovascular: No clubbing, cyanosis, or edema. Respiratory: Normal respiratory effort, no increased work of breathing. Neurologic: Grossly intact, no focal deficits, moving all 4 extremities. Psychiatric: Normal mood and affect.  Laboratory Data: Lab Results  Component Value Date   WBC 7.6 12/30/2022   HGB 7.2 (L) 12/30/2022   HCT 23.1 (L) 12/30/2022   MCV 104.1 (H) 12/30/2022   PLT 309 12/30/2022    Lab Results  Component Value Date   CREATININE 2.81 (H) 12/30/2022   Lab Results  Component Value Date   TSH 5.667 (H) 12/16/2022      Component Value Date/Time   CHOL 84 12/22/2022 0413   HDL 32 (L) 12/22/2022 0413   CHOLHDL 2.6 12/22/2022 0413   VLDL 12 12/22/2022 0413   LDLCALC 40 12/22/2022 0413    Lab Results  Component Value Date   AST 21 12/30/2022   Lab Results  Component Value Date   ALT 17 12/30/2022    Urinalysis    Component Value Date/Time   COLORURINE RED (A) 12/16/2022 1513   APPEARANCEUR TURBID (A) 12/16/2022 1513   APPEARANCEUR Cloudy (A) 10/01/2022 1414   LABSPEC  12/16/2022 1513    TEST NOT REPORTED DUE TO COLOR INTERFERENCE OF URINE PIGMENT   PHURINE  12/16/2022 1513    TEST NOT REPORTED DUE TO COLOR INTERFERENCE OF URINE PIGMENT   GLUCOSEU (A) 12/16/2022 1513    TEST NOT REPORTED DUE TO COLOR INTERFERENCE OF URINE PIGMENT   HGBUR (A) 12/16/2022 1513    TEST NOT REPORTED DUE TO COLOR INTERFERENCE OF  URINE PIGMENT   BILIRUBINUR (A) 12/16/2022 1513    TEST NOT REPORTED DUE TO COLOR INTERFERENCE OF URINE PIGMENT  BILIRUBINUR Negative 10/01/2022 1414   KETONESUR (A) 12/16/2022 1513    TEST NOT REPORTED DUE TO COLOR INTERFERENCE OF URINE PIGMENT   PROTEINUR (A) 12/16/2022 1513    TEST NOT REPORTED DUE TO COLOR INTERFERENCE OF URINE PIGMENT   NITRITE (A) 12/16/2022 1513    TEST NOT REPORTED DUE TO COLOR INTERFERENCE OF URINE PIGMENT   LEUKOCYTESUR (A) 12/16/2022 1513    TEST NOT REPORTED DUE TO COLOR INTERFERENCE OF URINE PIGMENT  I have reviewed the labs.  Pertinent imaging:  01/07/23 16:09  Scan Result 121 ml    Assessment & Plan:    1. Urinary retention -Foley removed this morning -This afternoon PVR demonstrates adequate bladder emptying -return precautions reviewed  2. Bladder cancer -he is scheduled for a surveillance cysto 03/03/2022   Return for keep appointment with Dr. Lonna Cobb in January .  These notes generated with voice recognition software. I apologize for typographical errors.  Cloretta Ned  [facility] [address]  [address] [place], [place] [address] [phone]

## 2023-01-06 ENCOUNTER — Telehealth: Payer: Self-pay | Admitting: *Deleted

## 2023-01-06 ENCOUNTER — Encounter: Payer: Medicare Other | Admitting: Physician Assistant

## 2023-01-06 ENCOUNTER — Telehealth: Payer: Self-pay | Admitting: Oncology

## 2023-01-06 NOTE — Telephone Encounter (Signed)
Patient wife called asking about the fact that patient has follow up appointment but no labs. Also asking if we have gotten him an appointment with [facility]. Please return her call

## 2023-01-06 NOTE — Telephone Encounter (Signed)
I called [facility] and spoke to [place] about appt. For appt referral for myeloma. Tresa Endo said that she called yest. And no one answered. I told her that sometimes pt. Do not answer strange numbers. I told Tresa Endo that I will call them and give them Kelly's number. I called and spoke to wife and she took down the telephone and she states she will call her today

## 2023-01-06 NOTE — Telephone Encounter (Signed)
Patients wife Dennie Bible called to change appointments for this patient. She states he will be going to [facility] and they suggested he cancel appointments on 12/24 and choose a different day for injection. Please call Pat @ [phone]

## 2023-01-06 NOTE — Telephone Encounter (Signed)
Can you clarify when the [facility] appt is and what the patient wants?

## 2023-01-07 ENCOUNTER — Encounter: Payer: Self-pay | Admitting: Oncology

## 2023-01-07 ENCOUNTER — Ambulatory Visit: Payer: Medicare Other | Admitting: Urology

## 2023-01-07 ENCOUNTER — Other Ambulatory Visit: Payer: Medicare Other | Admitting: Urology

## 2023-01-07 VITALS — Ht 66.0 in | Wt 151.0 lb

## 2023-01-07 DIAGNOSIS — Z8551 Personal history of malignant neoplasm of bladder: Secondary | ICD-10-CM

## 2023-01-07 DIAGNOSIS — R339 Retention of urine, unspecified: Secondary | ICD-10-CM | POA: Diagnosis not present

## 2023-01-07 DIAGNOSIS — C679 Malignant neoplasm of bladder, unspecified: Secondary | ICD-10-CM

## 2023-01-07 LAB — BLADDER SCAN AMB NON-IMAGING: Scan Result: 121

## 2023-01-07 NOTE — Telephone Encounter (Signed)
See other note

## 2023-01-08 ENCOUNTER — Encounter: Payer: Medicare Other | Admitting: Physician Assistant

## 2023-01-09 ENCOUNTER — Ambulatory Visit: Payer: Medicare Other | Admitting: Oncology

## 2023-01-12 ENCOUNTER — Encounter: Payer: Self-pay | Admitting: Urology

## 2023-01-19 ENCOUNTER — Other Ambulatory Visit: Payer: Self-pay

## 2023-01-19 DIAGNOSIS — D631 Anemia in chronic kidney disease: Secondary | ICD-10-CM

## 2023-01-20 ENCOUNTER — Inpatient Hospital Stay: Payer: Medicare Other | Attending: Radiation Oncology

## 2023-01-20 ENCOUNTER — Inpatient Hospital Stay: Payer: Medicare Other

## 2023-01-20 VITALS — BP 109/61 | HR 62

## 2023-01-20 DIAGNOSIS — Z8042 Family history of malignant neoplasm of prostate: Secondary | ICD-10-CM | POA: Diagnosis not present

## 2023-01-20 DIAGNOSIS — D631 Anemia in chronic kidney disease: Secondary | ICD-10-CM

## 2023-01-20 DIAGNOSIS — Z8546 Personal history of malignant neoplasm of prostate: Secondary | ICD-10-CM | POA: Insufficient documentation

## 2023-01-20 DIAGNOSIS — Z87891 Personal history of nicotine dependence: Secondary | ICD-10-CM | POA: Diagnosis not present

## 2023-01-20 DIAGNOSIS — N184 Chronic kidney disease, stage 4 (severe): Secondary | ICD-10-CM | POA: Diagnosis present

## 2023-01-20 DIAGNOSIS — R5383 Other fatigue: Secondary | ICD-10-CM | POA: Diagnosis not present

## 2023-01-20 LAB — HEMOGLOBIN AND HEMATOCRIT (CANCER CENTER ONLY)
HCT: 29.3 % — ABNORMAL LOW (ref 39.0–52.0)
Hemoglobin: 8.9 g/dL — ABNORMAL LOW (ref 13.0–17.0)

## 2023-01-20 MED ORDER — EPOETIN ALFA-EPBX 40000 UNIT/ML IJ SOLN
40000.0000 [IU] | INTRAMUSCULAR | Status: DC
  Administered 2023-01-20: 40000 [IU] via SUBCUTANEOUS
  Filled 2023-01-20: qty 1

## 2023-02-03 ENCOUNTER — Other Ambulatory Visit: Payer: Self-pay | Admitting: Nephrology

## 2023-02-03 DIAGNOSIS — N184 Chronic kidney disease, stage 4 (severe): Secondary | ICD-10-CM

## 2023-02-03 DIAGNOSIS — N2589 Other disorders resulting from impaired renal tubular function: Secondary | ICD-10-CM

## 2023-02-04 ENCOUNTER — Other Ambulatory Visit: Payer: Medicare Other | Admitting: Urology

## 2023-02-09 ENCOUNTER — Encounter: Payer: Self-pay | Admitting: Oncology

## 2023-02-10 ENCOUNTER — Ambulatory Visit: Payer: Medicare Other | Admitting: Oncology

## 2023-02-10 ENCOUNTER — Inpatient Hospital Stay: Payer: Medicare Other

## 2023-02-11 ENCOUNTER — Encounter: Payer: Self-pay | Admitting: Oncology

## 2023-02-11 ENCOUNTER — Encounter: Payer: Self-pay | Admitting: Urology

## 2023-02-12 ENCOUNTER — Ambulatory Visit: Payer: Medicare Other | Admitting: Urology

## 2023-02-12 ENCOUNTER — Ambulatory Visit
Admission: RE | Admit: 2023-02-12 | Discharge: 2023-02-12 | Disposition: A | Discharge status: Home / Self Care | Payer: Medicare Other | Source: Ambulatory Visit | Attending: Nephrology | Admitting: Nephrology

## 2023-02-12 ENCOUNTER — Encounter: Payer: Self-pay | Admitting: Urology

## 2023-02-12 ENCOUNTER — Other Ambulatory Visit: Payer: Self-pay

## 2023-02-12 DIAGNOSIS — N184 Chronic kidney disease, stage 4 (severe): Secondary | ICD-10-CM | POA: Diagnosis present

## 2023-02-12 DIAGNOSIS — N179 Acute kidney failure, unspecified: Secondary | ICD-10-CM | POA: Diagnosis not present

## 2023-02-12 DIAGNOSIS — N1330 Unspecified hydronephrosis: Secondary | ICD-10-CM

## 2023-02-12 DIAGNOSIS — R339 Retention of urine, unspecified: Secondary | ICD-10-CM | POA: Diagnosis not present

## 2023-02-12 DIAGNOSIS — N189 Chronic kidney disease, unspecified: Secondary | ICD-10-CM | POA: Diagnosis not present

## 2023-02-12 DIAGNOSIS — N2589 Other disorders resulting from impaired renal tubular function: Secondary | ICD-10-CM | POA: Diagnosis present

## 2023-02-12 DIAGNOSIS — D631 Anemia in chronic kidney disease: Secondary | ICD-10-CM

## 2023-02-12 LAB — URINALYSIS, COMPLETE
Bilirubin, UA: NEGATIVE
Glucose, UA: NEGATIVE
Ketones, UA: NEGATIVE
Nitrite, UA: POSITIVE — AB
Specific Gravity, UA: 1.015 (ref 1.005–1.030)
Urobilinogen, Ur: 0.2 mg/dL (ref 0.2–1.0)
pH, UA: 6 (ref 5.0–7.5)

## 2023-02-12 LAB — MICROSCOPIC EXAMINATION: WBC, UA: >30 /[HPF] — AB (ref 0–5)

## 2023-02-12 LAB — BLADDER SCAN AMB NON-IMAGING: Scan Result: 245

## 2023-02-12 NOTE — Progress Notes (Signed)
I, [name], acting as a scribe for Jake Altes, MD., have documented all relevant documentation on the behalf of Jake Altes, MD, as directed by Jake Altes, MD while in the presence of Jake Altes, MD.  02/12/2023 3:18 PM   [name] 1945/09/20 [id number]  Referring provider: [name], MD [address] [facility] [address] [facility] [place],  [place] [address]  Chief Complaint  Patient presents with   Urinary Retention   Urological history: 1. 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  Radiation cystitis hyperbaric oxygen therapy  HPI: [name] is a 77 y.o. male presents in follow-up at the recommendations of his oncologist.  Saw his oncologist 02/10/23 and creatinine elevated at 4.53. He is followed by nephrology for CKD. His recent residuals have been in the low-mid 100 range. He was asked to come in today for a PVR which was 245 He also saw Dr. Wynelle Link today, who ordered a renal ultrasound. On my review, he does have moderate to severe bilateral hydronephrosis and a dilated right ureter to the bladder was noted.   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 4v CABG on 04/13/2002. b.) LHC 02/03/2013 --> LVEF 50%; severe 3v CAD with patent LIMA-LAD and SVG-OM1 grafts; SVG-PDA and SVG-DG1 occluded with collaterals to PDA and DG1 from LAD.   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   a.) LIMA-LAD, SVG-OM1, SVG-D1, SVG-PDA   Tubular adenoma of colon 02/03/2007    Surgical History: Past Surgical History:  Procedure Laterality Date   CARDIAC CATHETERIZATION Left 04/13/2002   Procedure: CARDIAC CATHETERIZATION; Location: [facility]; Surgeon; Marcina Millard, MD   CARDIAC CATHETERIZATION Left 02/03/2013   Procedure: CARDIAC CATHETERIZATION; Location: [facility]; Surgeon: Marcina Millard, MD   COLONOSCOPY W/ POLYPECTOMY     COLONOSCOPY WITH PROPOFOL N/A 04/09/2015   Procedure: COLONOSCOPY WITH PROPOFOL;  Surgeon: Wallace Cullens, MD;  Location: [facility] ENDOSCOPY;  Service: Gastroenterology;  Laterality: N/A;   CORONARY ANGIOPLASTY     CORONARY ARTERY BYPASS GRAFT N/A 04/13/2002   Procedure: 4v CABG (LIMA-LAD, SVG-OM1, SVG-D1, SVG-PDA); Location: [facility]; Surgeon: Rana Snare, MD   CYSTOSCOPY N/A 07/17/2021   Procedure: Bluford Kaufmann;  Surgeon: Jake Altes, MD;  Location: [facility] ORS;  Service: Urology;  Laterality: N/A;  CYSTOSCOPY N/A 06/24/2022   Procedure: DIAGNOSTIC CYSTOSCOPY;  Surgeon: Jake Altes, MD;  Location: [facility] ORS;  Service: Urology;  Laterality: N/A;   CYSTOSCOPY WITH BIOPSY N/A 07/17/2021   Procedure: CYSTOSCOPY WITH BIOPSY;  Surgeon: Jake Altes, MD;  Location: [facility] ORS;  Service: Urology;  Laterality: N/A;   CYSTOSCOPY WITH BIOPSY N/A 06/24/2022   Procedure: CYSTOSCOPY WITH BLADDER BIOPSY;  Surgeon: Jake Altes, MD;  Location: [facility] ORS;  Service: Urology;  Laterality: N/A;   CYSTOSCOPY WITH FULGERATION N/A 07/17/2021   Procedure: CYSTOSCOPY WITH FULGERATION;  Surgeon: Jake Altes, MD;  Location: [facility] ORS;  Service: Urology;  Laterality: N/A;   CYSTOSCOPY WITH FULGERATION N/A 06/24/2022   Procedure: CYSTOSCOPY WITH FULGERATION;  Surgeon: Jake Altes, MD;  Location: [facility] ORS;  Service: Urology;  Laterality: N/A;   EYE SURGERY     both eyes with cataract with lens 2021   IR BONE MARROW BIOPSY & ASPIRATION  12/17/2022   TONSILLECTOMY     TRANSURETHRAL RESECTION OF BLADDER TUMOR N/A 11/13/2020   Procedure: TRANSURETHRAL RESECTION OF BLADDER TUMOR (TURBT);  Surgeon: Jake Altes, MD;  Location: [facility] ORS;  Service: Urology;  Laterality: N/A;   TRANSURETHRAL RESECTION OF BLADDER TUMOR N/A 06/24/2022   Procedure: TRANSURETHRAL RESECTION OF BLADDER TUMOR (TURBT);  Surgeon: Jake Altes, MD;  Location: [facility] ORS;  Service: Urology;  Laterality: N/A;   TRANSURETHRAL RESECTION OF BLADDER TUMOR WITH GYRUS (TURBT-GYRUS)  06/24/2022   TRANSURETHRAL RESECTION OF PROSTATE      Home Medications:  Allergies as of 02/12/2023       Reactions   Cidex [glutaral] Anaphylaxis   Other Anaphylaxis   - Ortho-phthalaldehyde  - trospion cause AMS        Medication List        Accurate as of February 12, 2023  3:18 PM. If you have any questions, ask your nurse or doctor.          acetaminophen 500 MG tablet Commonly known as: TYLENOL Take 500 mg by mouth every 6 (six) hours as needed.   aspirin 81 MG tablet Take 81 mg by mouth daily.   atorvastatin 10 MG tablet Commonly known as: LIPITOR Take 10 mg by mouth daily.   cetirizine 10 MG tablet Commonly known as: ZYRTEC Take 10 mg by mouth at bedtime.   Coenzyme Q10 100 MG capsule Take 100 mg by mouth daily.   cyanocobalamin 1000 MCG tablet Commonly known as: VITAMIN B12 Take 1,000 mcg by mouth daily.   feeding supplement Liqd Take  237 mLs by mouth 3 (three) times daily between meals.   ferrous sulfate 324 MG Tbec Take 324 mg by mouth.   Fish Oil 1000 MG Caps Take 1,000 mg by mouth daily.   fluticasone 50 MCG/ACT nasal spray Commonly known as: FLONASE Place 1 spray into both nostrils as needed.   GLUCOSAMINE-CHONDROITIN PO Take 1 tablet by mouth 2 (two) times daily.   isosorbide mononitrate 30 MG 24 hr tablet Commonly known as: IMDUR Take 1 tablet by mouth daily.   Magnesium 200 MG Tabs Take 200 mg by mouth 2 (two) times daily.   Mega Probiotic Caps Take 1 capsule by mouth daily.   metoprolol tartrate 25 MG tablet Commonly known as: LOPRESSOR Take 25 mg by mouth 2 (two) times daily.   metroNIDAZOLE 0.75 % gel Commonly known as: METROGEL Apply 1 application topically 2 (two) times daily.   multivitamin tablet Take 1 tablet by mouth daily.  nitroGLYCERIN 0.4 MG SL tablet Commonly known as: NITROSTAT Place under the tongue.   senna-docusate 8.6-50 MG tablet Commonly known as: Senokot-S Take 1 tablet by mouth 2 (two) times daily as needed for mild constipation.   silodosin 8 MG Caps capsule Commonly known as: RAPAFLO Take 1 capsule (8 mg total) by mouth daily with breakfast.   simethicone 80 MG chewable tablet Commonly known as: MYLICON Chew 1 tablet (80 mg total) by mouth 4 (four) times daily as needed for flatulence.   Vitamin A 2400 MCG (8000 UT) Caps Take 8,000 Units by mouth daily.   vitamin C 250 MG tablet Commonly known as: ASCORBIC ACID Take 250 mg by mouth 2 (two) times daily.   zinc gluconate 50 MG tablet Take 50 mg by mouth daily.        Allergies:  Allergies  Allergen Reactions   Cidex [Glutaral] Anaphylaxis   Other Anaphylaxis    - Ortho-phthalaldehyde  - trospion cause AMS    Family History: Family History  Problem Relation Age of Onset   Prostate cancer Father        Metastatic   Cancer Father     Social History:  reports that he quit smoking about  16 years ago. His smoking use included cigarettes. He started smoking about 77 years ago. He has a 45 pack-year smoking history. He has been exposed to tobacco smoke. He has never used smokeless tobacco. He reports current alcohol use of about 2.0 standard drinks of alcohol per week. He reports that he does not use drugs.   Physical Exam: There were no vitals taken for this visit.  Constitutional:  Alert and oriented, No acute distress. HEENT: [place] AT. Respiratory: Normal respiratory effort, no increased work of breathing. Psychiatric: Normal mood and affect.   Pertinent Imaging: Renal ultrasound was personally reviewed and interpreted. There is moderate to severe bilateral hydronephrosis and a dilated right ureter to the bladder was noted.  The study has not been interpreted by radiology   Assessment & Plan:    1. CKD with AKI Although his residual was less than 300, he does have bilateral hydropnephrosis with right hydroureter on renal ultrasound today.  Recommended Foley catheter placement and will repeat his renal ultrasound and creatinine in 1 week.    I have reviewed the above documentation for accuracy and completeness, and I agree with the above.   Jake Altes, MD  [facility] [address] [place], [place] [address] [phone]

## 2023-02-12 NOTE — Progress Notes (Signed)
Simple Catheter Placement  Due to urinary retention patient is present today for a foley cath placement.  Patient was cleaned and prepped in a sterile fashion with betadine and 2% lidocaine jelly was instilled into the urethra. A 16coude FR foley catheter was inserted, urine return was noted  , urine was cloudy yellow  in color.  The balloon was filled with 10cc of sterile water.  A leg  bag was attached for drainage. Patient was also given a night bag to take home and was given instruction on how to change from one bag to another.  Patient was given instruction on proper catheter care.  Patient tolerated well, no complications were noted   Performed by: Ples Specter CMA

## 2023-02-13 ENCOUNTER — Inpatient Hospital Stay: Payer: Medicare Other

## 2023-02-13 ENCOUNTER — Inpatient Hospital Stay (HOSPITAL_BASED_OUTPATIENT_CLINIC_OR_DEPARTMENT_OTHER): Payer: Medicare Other | Admitting: Oncology

## 2023-02-13 ENCOUNTER — Encounter: Payer: Self-pay | Admitting: Oncology

## 2023-02-13 VITALS — BP 99/55 | HR 78 | Temp 98.6°F | Resp 20 | Wt 141.1 lb

## 2023-02-13 DIAGNOSIS — N184 Chronic kidney disease, stage 4 (severe): Secondary | ICD-10-CM

## 2023-02-13 DIAGNOSIS — D631 Anemia in chronic kidney disease: Secondary | ICD-10-CM | POA: Diagnosis not present

## 2023-02-13 DIAGNOSIS — Z79899 Other long term (current) drug therapy: Secondary | ICD-10-CM

## 2023-02-13 DIAGNOSIS — R768 Other specified abnormal immunological findings in serum: Secondary | ICD-10-CM | POA: Diagnosis not present

## 2023-02-13 DIAGNOSIS — D649 Anemia, unspecified: Secondary | ICD-10-CM | POA: Diagnosis not present

## 2023-02-13 LAB — HEMOGLOBIN AND HEMATOCRIT (CANCER CENTER ONLY)
HCT: 27.7 % — ABNORMAL LOW (ref 39.0–52.0)
Hemoglobin: 8.9 g/dL — ABNORMAL LOW (ref 13.0–17.0)

## 2023-02-13 MED ORDER — EPOETIN ALFA-EPBX 40000 UNIT/ML IJ SOLN
40000.0000 [IU] | INTRAMUSCULAR | Status: DC
  Administered 2023-02-13: 40000 [IU] via SUBCUTANEOUS
  Filled 2023-02-13: qty 1

## 2023-02-16 LAB — CULTURE, URINE COMPREHENSIVE

## 2023-02-18 ENCOUNTER — Encounter: Payer: Self-pay | Admitting: Urology

## 2023-02-19 ENCOUNTER — Other Ambulatory Visit: Payer: Medicare Other

## 2023-02-19 DIAGNOSIS — R339 Retention of urine, unspecified: Secondary | ICD-10-CM

## 2023-02-19 DIAGNOSIS — N179 Acute kidney failure, unspecified: Secondary | ICD-10-CM

## 2023-02-20 ENCOUNTER — Other Ambulatory Visit: Payer: Self-pay | Admitting: *Deleted

## 2023-02-20 ENCOUNTER — Encounter: Payer: Self-pay | Admitting: *Deleted

## 2023-02-20 DIAGNOSIS — N1330 Unspecified hydronephrosis: Secondary | ICD-10-CM

## 2023-02-20 LAB — BASIC METABOLIC PANEL
BUN/Creatinine Ratio: 17 (ref 10–24)
BUN: 56 mg/dL — ABNORMAL HIGH (ref 8–27)
CO2: 18 mmol/L — ABNORMAL LOW (ref 20–29)
Calcium: 9.3 mg/dL (ref 8.6–10.2)
Chloride: 101 mmol/L (ref 96–106)
Creatinine, Ser: 3.25 mg/dL — ABNORMAL HIGH (ref 0.76–1.27)
Glucose: 123 mg/dL — ABNORMAL HIGH (ref 70–99)
Potassium: 3.7 mmol/L (ref 3.5–5.2)
Sodium: 137 mmol/L (ref 134–144)
eGFR: 19 mL/min/{1.73_m2} — ABNORMAL LOW (ref >59)

## 2023-02-23 ENCOUNTER — Ambulatory Visit
Admission: RE | Admit: 2023-02-23 | Discharge: 2023-02-23 | Disposition: A | Discharge status: Home / Self Care | Payer: Medicare Other | Source: Ambulatory Visit | Attending: Urology | Admitting: Urology

## 2023-02-23 DIAGNOSIS — N1330 Unspecified hydronephrosis: Secondary | ICD-10-CM | POA: Insufficient documentation

## 2023-03-01 ENCOUNTER — Encounter: Payer: Self-pay | Admitting: Oncology

## 2023-03-03 ENCOUNTER — Inpatient Hospital Stay: Payer: Medicare Other

## 2023-03-03 ENCOUNTER — Inpatient Hospital Stay: Payer: Medicare Other | Attending: Radiation Oncology

## 2023-03-03 VITALS — BP 91/56 | HR 54

## 2023-03-03 DIAGNOSIS — N184 Chronic kidney disease, stage 4 (severe): Secondary | ICD-10-CM | POA: Insufficient documentation

## 2023-03-03 DIAGNOSIS — D631 Anemia in chronic kidney disease: Secondary | ICD-10-CM | POA: Diagnosis present

## 2023-03-03 DIAGNOSIS — Z87891 Personal history of nicotine dependence: Secondary | ICD-10-CM | POA: Diagnosis not present

## 2023-03-03 DIAGNOSIS — R5383 Other fatigue: Secondary | ICD-10-CM | POA: Insufficient documentation

## 2023-03-03 DIAGNOSIS — Z8042 Family history of malignant neoplasm of prostate: Secondary | ICD-10-CM | POA: Diagnosis not present

## 2023-03-03 DIAGNOSIS — Z8546 Personal history of malignant neoplasm of prostate: Secondary | ICD-10-CM | POA: Diagnosis not present

## 2023-03-03 DIAGNOSIS — R768 Other specified abnormal immunological findings in serum: Secondary | ICD-10-CM

## 2023-03-03 LAB — HEMOGLOBIN AND HEMATOCRIT (CANCER CENTER ONLY)
HCT: 33.5 % — ABNORMAL LOW (ref 39.0–52.0)
Hemoglobin: 10.3 g/dL — ABNORMAL LOW (ref 13.0–17.0)

## 2023-03-03 MED ORDER — EPOETIN ALFA-EPBX 40000 UNIT/ML IJ SOLN
40000.0000 [IU] | INTRAMUSCULAR | Status: DC
  Administered 2023-03-03: 40000 [IU] via SUBCUTANEOUS
  Filled 2023-03-03: qty 1

## 2023-03-04 ENCOUNTER — Ambulatory Visit (INDEPENDENT_AMBULATORY_CARE_PROVIDER_SITE_OTHER): Payer: Medicare Other | Admitting: Urology

## 2023-03-04 VITALS — Ht 67.0 in | Wt 141.0 lb

## 2023-03-04 DIAGNOSIS — R3915 Urgency of urination: Secondary | ICD-10-CM

## 2023-03-04 DIAGNOSIS — Z8551 Personal history of malignant neoplasm of bladder: Secondary | ICD-10-CM

## 2023-03-04 DIAGNOSIS — R339 Retention of urine, unspecified: Secondary | ICD-10-CM | POA: Diagnosis not present

## 2023-03-04 MED ORDER — CIPROFLOXACIN HCL 500 MG PO TABS
500.0000 mg | ORAL_TABLET | Freq: Once | ORAL | Status: AC
  Administered 2023-03-04: 500 mg via ORAL

## 2023-03-04 NOTE — Progress Notes (Signed)
   03/04/23  CC:  Chief Complaint  Patient presents with   Cysto   Urologic history: 1.  Recurrent Ta urothelial carcinoma bladder (available record review) TURBT July 2011; low-grade Ta TURBT December 2014; low-grade Ta; postresection mitomycin TURBT 07/2015; low-grade Ta; intravesical gemcitabine x6 weeks TURBT 10/2020;Ta low-grade urothelial carcinoma with small focus suggestive of high-grade features; repeat intravesical gemcitabine 6 completed 01/07/2021 Bx w/ fulguration 07/17/2021; Ta low grade    2.  Intermediate risk adenocarcinoma prostate -Treated IMRT+ ADT -Last PSA 12/24/2020 0.03  HPI: Foley catheter placed 02/12/2023 when he was found to have creatinine 4.53 and bilateral hydronephrosis on RUS.  Follow-up RUS 02/23/2023 showed improvement in his hydronephrosis and creatinine drawn yesterday was 2.99  Height 5\' 7"  (1.702 m), weight 141 lb (64 kg). NED. A&Ox3.   No respiratory distress     Cystoscopy Procedure Note  Patient identification was confirmed, informed consent was obtained, and patient was prepped using Betadine solution.  Lidocaine jelly was administered per urethral meatus.     Pre-Procedure: - Inspection reveals a normal caliber urethral meatus.  Procedure: The flexible cystoscope was introduced without difficulty - No urethral strictures/lesions are present. -Mild-moderate lateral lobe enlargement prostate  -  Open  bladder neck - Bilateral ureteral orifices identified - Bladder mucosa  reveals tumors.  Inflammatory changes posterior wall/trigone most likely secondary to indwelling Foley - No bladder stones   Post-Procedure: - Patient tolerated the procedure well  Assessment/ Plan: No papillary tumor noted Most likely inflammatory changes secondary to indwelling Foley.  Urine aspirated from cystoscope for cytology We discussed options of indwelling Foley catheter versus intermittent catheterization and he has opted for CIC.  For CIC teaching  will be scheduled Prostate volume on PET/CT performed late October 2024 was 65 cc by bullet volume.  We did discuss he is at risk for urinary incontinence with an outlet procedure post radiation.  Discussed HoLEP with Dr. Richardo Hanks and he agreed that there is risk of significant postop incontinence and he would not recommend    [name], MD

## 2023-03-04 NOTE — Progress Notes (Signed)
Catheter Removal  Patient is present today for a catheter removal.  69m of water was drained from the balloon. A 16FR foley cath was removed from the bladder, no complications were noted. Patient tolerated well.  Performed by: JGaspar ColaCMA

## 2023-03-05 ENCOUNTER — Ambulatory Visit (INDEPENDENT_AMBULATORY_CARE_PROVIDER_SITE_OTHER): Payer: Medicare Other | Admitting: Physician Assistant

## 2023-03-05 VITALS — BP 101/54 | HR 67

## 2023-03-05 DIAGNOSIS — R339 Retention of urine, unspecified: Secondary | ICD-10-CM | POA: Diagnosis not present

## 2023-03-05 NOTE — Progress Notes (Addendum)
Continuous Intermittent Catheterization  Due to incomplete bladder emptying patient is present today for a teaching of self I & O Catheterization. Patient was given detailed verbal and printed instructions of self catheterization. Patient was cleaned and prepped in a sterile fashion.  With instruction and assistance patient inserted a 16FR [employer] Speedicath Standard Coude and urine return was noted 125 ml, urine was pink in color. Patient tolerated well, no complications were noted Patient was given a sample bag with supplies to take home.  Instructions were given for patient to cath 2 times daily.  An order was placed with [facility] for catheters to be sent to the patient's home.  Due to BPH, he is unable to pass a straight tip catheter to the level of the bladder and requires coude tip catheters.  Performed by: Carman Ching, PA-C   Follow up: Return in about 6 weeks (around 04/16/2023) for Follow up with Dr. Lonna Cobb with BMP prior.    ADDENDUM 04/03/2023: INCREASING FREQUENCY OF CIC TO 3 TIMES DAILY DUE TO URINARY RETENTION. PATIENT IS UNABLE TO PASS STRAIGHT CATHETER DUE TO BPH, COUDE CATHETER REQUIRED. Carman Ching, PA-C

## 2023-03-05 NOTE — Patient Instructions (Signed)
 Clean Intermittent Catheterization, Male  Clean intermittent catheterization (CIC) is a procedure to drain pee (urine) from the bladder by placing a soft tube (catheter) into the bladder though the urethra. The urethra is a tube in the body that carries pee from the bladder out of the body. CIC reduces the risk of infection and other problems that may arise when pee is not completely emptied from the bladder. CIC may be done when: You cannot completely empty your bladder on your own. This may be due to a blockage in the bladder or urethra. Your bladder leaks pee. This may happen when the muscles or nerves near the bladder are not working normally, so the bladder overflows. Your health care provider will show you how to do the procedure. You will also be given the supplies you need to do the procedure. Supplies needed: Germ-free (sterile), water-based lubricant. A container for pee collection. You may also use the toilet to dispose of pee from the catheter. A catheter. Your provider will determine the best size for you. Use this catheter size: ______________________________ Clean gloves. Soap and water. Clean washcloth and towel. How to perform this procedure: Most people need CIC at least 4 times per day to completely empty the bladder. Your provider will tell you how often you should do CIC. Number of times per day to perform CIC: ______________________________________________________________________ To perform CIC, follow these steps: Wash your hands with soap and water for 20 seconds. If soap and water are not available, use hand sanitizer. Clean your penis with soap and water. Dry the tip of your penis completely. Prepare the supplies that you will use during the procedure. Open the catheter package and lubricant. Get in a comfortable position. Possible positions include: Sitting on a toilet, a chair, or the edge of a bed. Standing near a toilet. Lying down with your head raised on  pillows and your knees pointing to the ceiling. You may wish to place a waterproof mat or pad under you. If you are using a pee collection container, position it between your legs. Pee, if you are able. Put on gloves. Apply lubricant to about 2 inches (5 cm) of the tip of the catheter. Set the catheter down on a clean, dry surface within reach. Gently stretch your penis out from your body. Pull back any skin that covers the end of your penis (foreskin). Clean the end of your penis with sterile swabs as told by your provider. Hold your penis upward at a 45-60 degree angle. This helps to straighten the urethra. Slowly insert the lubricated catheter straight into your urethra until pee flows freely. This is usually about 6-8 inches (15-20 cm). When pee starts to flow freely, insert the catheter 1 inch (3 cm) more. Allow pee to drain into the toilet or the pee collection container. When pee stops flowing, slowly remove the catheter. Note the color, amount, and odor of the pee. Measure your pee and note the amount, if told by your provider. Discard the pee in the toilet. Clean your penis using soap and water. Move the foreskin back in place, if applicable. If you are using a single-use catheter, discard the catheter and supplies. Wash your hands with soap and water. If you are using a reusable catheter, follow package instructions about how to clean the catheter after each use. How often should I do this procedure? Do CIC to empty your bladder every 4-6 hours or as often as told by your provider. If you have symptoms of  too much pee in your bladder (overdistension) and you are not able to pee, perform CIC. Symptoms of overdistension may include: Restlessness. Sweating or chills. Headache. Flushed or pale skin. Bloated lower abdomen. What are the risks? The provider will talk with you about the risks. These may include: Infection. Injury to the urethra. Irritation of the urethra. Follow  these instructions at home General instructions Drink enough fluid to keep your pee pale yellow. Throw away a catheter when it becomes dry, brittle, or cloudy. This usually happens after you use the catheter for 1 week. Avoid caffeine. Caffeine may make you need to pee more frequently and more urgently. When traveling, bring extra supplies with you in case of delays. Keep supplies with you in a place that you can access easily. If traveling by plane: Make sure that the lubricant in your carry-on bag is less than 3.4 ounces (100 mL). Use a single-use catheter. It may be difficult to clean a reusable catheter in a small bathroom. Take over-the-counter and prescription medicines only as told by your provider. Contact a health care provider if: You have difficulty doing CIC. You have pee leaking around the catheter during CIC. You have: Dark or cloudy pee. Blood in your pee or in your catheter. A change in the smell of your pee or discharge. A burning feeling while you pee. You vomit or feel like you may vomit. You have pain in your abdomen, your back, or your sides below your ribs. You have swelling or redness around the opening of your urethra. You develop a rash or sores on your skin. Get help right away if: You have a fever. You have symptoms that do not go away after 3 days. You have symptoms that suddenly get worse. You have severe pain. You start passing a little pee, or a little pee drains from your bladder. This information is not intended to replace advice given to you by your health care provider. Make sure you discuss any questions you have with your health care provider. Document Revised: 09/30/2021 Document Reviewed: 09/30/2021 Elsevier Patient Education  2024 [employer].

## 2023-03-09 NOTE — Telephone Encounter (Signed)
Called pt to let him know catheters were really for pick up here at the office, but pt wanted a response to message he sent this morning before coming. Please advise.

## 2023-03-16 ENCOUNTER — Ambulatory Visit: Payer: Medicare Other | Admitting: Urology

## 2023-03-16 ENCOUNTER — Other Ambulatory Visit
Admission: RE | Admit: 2023-03-16 | Discharge: 2023-03-16 | Disposition: A | Discharge status: Home / Self Care | Payer: Medicare Other | Attending: Urology | Admitting: Urology

## 2023-03-16 ENCOUNTER — Encounter: Payer: Self-pay | Admitting: Urology

## 2023-03-16 VITALS — BP 137/59 | HR 89

## 2023-03-16 DIAGNOSIS — N3289 Other specified disorders of bladder: Secondary | ICD-10-CM | POA: Diagnosis not present

## 2023-03-16 DIAGNOSIS — Z8551 Personal history of malignant neoplasm of bladder: Secondary | ICD-10-CM

## 2023-03-16 DIAGNOSIS — R3989 Other symptoms and signs involving the genitourinary system: Secondary | ICD-10-CM

## 2023-03-16 DIAGNOSIS — R8281 Pyuria: Secondary | ICD-10-CM

## 2023-03-16 DIAGNOSIS — R8271 Bacteriuria: Secondary | ICD-10-CM

## 2023-03-16 DIAGNOSIS — R310 Gross hematuria: Secondary | ICD-10-CM | POA: Insufficient documentation

## 2023-03-16 LAB — URINALYSIS, COMPLETE (UACMP) WITH MICROSCOPIC
Bilirubin Urine: NEGATIVE
Glucose, UA: NEGATIVE mg/dL
Ketones, ur: NEGATIVE mg/dL
Nitrite: NEGATIVE
Protein, ur: 100 mg/dL — AB
RBC / HPF: >50 RBC/hpf (ref 0–5)
Specific Gravity, Urine: 1.02 (ref 1.005–1.030)
WBC, UA: >50 WBC/hpf (ref 0–5)
pH: 6 (ref 5.0–8.0)

## 2023-03-16 MED ORDER — NITROFURANTOIN MONOHYD MACRO 100 MG PO CAPS: 100.0000 mg | ORAL_CAPSULE | Freq: Two times a day (BID) | ORAL | 0 refills | Status: DC

## 2023-03-16 MED ORDER — CEFTRIAXONE SODIUM 500 MG IJ SOLR
1000.0000 mg | Freq: Once | INTRAMUSCULAR | Status: AC
  Administered 2023-03-16: 1000 mg via INTRAMUSCULAR

## 2023-03-16 NOTE — Progress Notes (Deleted)
03/16/2023 9:25 AM   [name] 28-Dec-1945 [id number]  Referring provider: [name], MD [address] [facility] [address] [facility] [place],  [place] [address]  Urological history: 1. 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  Intermediate risk adenocarcinoma prostate -PSA (09/2022) - 0.13 Treated IMRT+ ADT (2020)   3.  Radiation cystitis -Undergoing hyperbaric oxygen therapy  4. BPH with retention -prostate volume 65 cc (PET/CT 11/2022)  -cysto (02/2023) mild to moderate lateral lobe enlargement of the prostate   No chief complaint on file.  HPI: [name] is a 78 y.o. male who presents today for hospital follow up with his wife, [name].  ***  Previous records reviewed.   He has a history of bilateral hydronephrosis which was improved with indwelling Foley.  At his office visit for cystoscopy March 04, 2023 he had Dr. Lonna Cobb had a discussion on how to treat his incomplete bladder emptying and prevent further renal compromise by either having an indwelling Foley or proceeding with CIC.  He has chosen the latter.  He has also discussed that undergoing a HoLEP would likely leave him incontinent and it would not be recommended.  He came in on March 05, 2023 and was instructed on CIC.  He sent a MyChart message yesterday stating that he thought he may have come down with a UTI.  There have been a number of times where he spontaneously voided over the past 3 to 4 days and there was more blood in his urine and more slightly larger clots.      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 4v CABG on 04/13/2002. b.) LHC 02/03/2013 --> LVEF 50%; severe 3v CAD with patent LIMA-LAD and SVG-OM1 grafts; SVG-PDA and SVG-DG1 occluded with collaterals to PDA and DG1 from LAD.   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   a.) LIMA-LAD, SVG-OM1, SVG-D1, SVG-PDA   Tubular adenoma of colon 02/03/2007    Surgical History: Past Surgical History:  Procedure Laterality Date   CARDIAC CATHETERIZATION Left 04/13/2002   Procedure: CARDIAC CATHETERIZATION; Location: [facility]; Surgeon; Marcina Millard, MD   CARDIAC CATHETERIZATION Left 02/03/2013   Procedure: CARDIAC CATHETERIZATION; Location: [facility]; Surgeon: Marcina Millard, MD   COLONOSCOPY W/ POLYPECTOMY     COLONOSCOPY WITH PROPOFOL N/A 04/09/2015   Procedure: COLONOSCOPY WITH PROPOFOL;  Surgeon: Wallace Cullens, MD;  Location: [facility];  Service: Gastroenterology;  Laterality: N/A;   CORONARY ANGIOPLASTY     CORONARY ARTERY BYPASS GRAFT N/A 04/13/2002   Procedure: 4v CABG (LIMA-LAD, SVG-OM1, SVG-D1, SVG-PDA); Location: [facility]; Surgeon: Rana Snare, MD   CYSTOSCOPY N/A 07/17/2021   Procedure: Bluford Kaufmann;  Surgeon: [name], MD;  Location: [facility];  Service: Urology;  Laterality: N/A;   CYSTOSCOPY N/A 06/24/2022    Procedure: DIAGNOSTIC CYSTOSCOPY;  Surgeon: [name], MD;  Location: [facility];  Service: Urology;  Laterality: N/A;   CYSTOSCOPY WITH BIOPSY N/A 07/17/2021   Procedure: CYSTOSCOPY WITH BIOPSY;  Surgeon: [name], MD;  Location: [facility];  Service: Urology;  Laterality: N/A;   CYSTOSCOPY WITH BIOPSY N/A 06/24/2022   Procedure: CYSTOSCOPY WITH BLADDER BIOPSY;  Surgeon: [name], MD;  Location: [facility];  Service: Urology;  Laterality: N/A;   CYSTOSCOPY WITH FULGERATION N/A 07/17/2021   Procedure: CYSTOSCOPY WITH FULGERATION;  Surgeon: [name], MD;  Location: [facility];  Service: Urology;  Laterality: N/A;   CYSTOSCOPY WITH FULGERATION N/A 06/24/2022   Procedure: CYSTOSCOPY WITH FULGERATION;  Surgeon: [name], MD;  Location: [facility];  Service: Urology;  Laterality: N/A;   EYE SURGERY     both eyes with cataract with lens 2021   IR BONE MARROW BIOPSY & ASPIRATION  12/17/2022   TONSILLECTOMY     TRANSURETHRAL RESECTION OF BLADDER TUMOR N/A 11/13/2020   Procedure: TRANSURETHRAL RESECTION OF BLADDER TUMOR (TURBT);  Surgeon: [name], MD;  Location: [facility];  Service: Urology;  Laterality: N/A;   TRANSURETHRAL RESECTION OF BLADDER TUMOR N/A 06/24/2022   Procedure: TRANSURETHRAL RESECTION OF BLADDER TUMOR (TURBT);  Surgeon: [name], MD;  Location: [facility];  Service: Urology;  Laterality: N/A;   TRANSURETHRAL RESECTION OF BLADDER TUMOR WITH GYRUS (TURBT-GYRUS)  06/24/2022   TRANSURETHRAL RESECTION OF PROSTATE      Home Medications:  Allergies as of 03/16/2023       Reactions   Cidex [glutaral] Anaphylaxis   Other Anaphylaxis   - Ortho-phthalaldehyde  - trospion cause AMS        Medication List        Accurate as of March 16, 2023  9:25 AM. If you have any questions, ask your nurse or doctor.          acetaminophen 500 MG tablet Commonly known as: TYLENOL Take 500 mg by mouth every 6 (six) hours as needed.   aspirin 81 MG  tablet Take 81 mg by mouth daily.   atorvastatin 10 MG tablet Commonly known as: LIPITOR Take 10 mg by mouth daily.   cetirizine 10 MG tablet Commonly known as: ZYRTEC Take 10 mg by mouth at bedtime.   Coenzyme Q10 100 MG capsule Take 100 mg by mouth daily.   feeding supplement Liqd Take 237 mLs by mouth 3 (three) times daily between meals.   ferrous sulfate 324 MG Tbec Take 324 mg by mouth.   Fish Oil 1000 MG Caps Take 1,000 mg by mouth daily.   fluticasone 50 MCG/ACT nasal spray Commonly known as: FLONASE Place 1 spray into both nostrils as needed.   furosemide 40 MG tablet Commonly known as: LASIX Take by mouth.   GLUCOSAMINE-CHONDROITIN PO Take 1 tablet by mouth 2 (two) times daily.   Magnesium 200 MG Tabs Take 200 mg by mouth 2 (two) times daily.   Mega Probiotic Caps Take 1 capsule by mouth daily.  metoprolol tartrate 25 MG tablet Commonly known as: LOPRESSOR Take 25 mg by mouth 2 (two) times daily.   metroNIDAZOLE 0.75 % gel Commonly known as: METROGEL Apply 1 application topically 2 (two) times daily.   multivitamin tablet Take 1 tablet by mouth daily.   nitroGLYCERIN 0.4 MG SL tablet Commonly known as: NITROSTAT Place under the tongue.   senna-docusate 8.6-50 MG tablet Commonly known as: Senokot-S Take 1 tablet by mouth 2 (two) times daily as needed for mild constipation.   silodosin 8 MG Caps capsule Commonly known as: RAPAFLO Take 1 capsule (8 mg total) by mouth daily with breakfast.   simethicone 80 MG chewable tablet Commonly known as: MYLICON Chew 1 tablet (80 mg total) by mouth 4 (four) times daily as needed for flatulence.   Vitamin A 2400 MCG (8000 UT) Caps Take 8,000 Units by mouth daily.   vitamin C 250 MG tablet Commonly known as: ASCORBIC ACID Take 250 mg by mouth 2 (two) times daily.   zinc gluconate 50 MG tablet Take 50 mg by mouth daily.        Allergies:  Allergies  Allergen Reactions   Cidex [Glutaral]  Anaphylaxis   Other Anaphylaxis    - Ortho-phthalaldehyde  - trospion cause AMS    Family History: Family History  Problem Relation Age of Onset   Prostate cancer Father        Metastatic   Cancer Father     Social History:  reports that he quit smoking about 17 years ago. His smoking use included cigarettes. He started smoking about 77 years ago. He has a 45 pack-year smoking history. He has been exposed to tobacco smoke. He has never used smokeless tobacco. He reports current alcohol use of about 2.0 standard drinks of alcohol per week. He reports that he does not use drugs.  ROS: Pertinent ROS in HPI  Physical Exam: There were no vitals taken for this visit.  Constitutional:  Well nourished. Alert and oriented, No acute distress. HEENT: [place] AT, moist mucus membranes.  Trachea midline, no masses. Cardiovascular: No clubbing, cyanosis, or edema. Respiratory: Normal respiratory effort, no increased work of breathing. GI: Abdomen is soft, non tender, non distended, no abdominal masses. Liver and spleen not palpable.  No hernias appreciated.  Stool sample for occult testing is not indicated.   GU: No CVA tenderness.  No bladder fullness or masses.  Patient with circumcised/uncircumcised phallus. ***Foreskin easily retracted***  Urethral meatus is patent.  No penile discharge. No penile lesions or rashes. Scrotum without lesions, cysts, rashes and/or edema.  Testicles are located scrotally bilaterally. No masses are appreciated in the testicles. Left and right epididymis are normal. Rectal: Patient with  normal sphincter tone. Anus and perineum without scarring or rashes. No rectal masses are appreciated. Prostate is approximately *** grams, *** nodules are appreciated. Seminal vesicles are normal. Skin: No rashes, bruises or suspicious lesions. Lymph: No cervical or inguinal adenopathy. Neurologic: Grossly intact, no focal deficits, moving all 4 extremities. Psychiatric: Normal mood and  affect.   Laboratory Data: Lab Results  Component Value Date   WBC 7.6 12/30/2022   HGB 10.3 (L) 03/03/2023   HCT 33.5 (L) 03/03/2023   MCV 104.1 (H) 12/30/2022   PLT 309 12/30/2022    Lab Results  Component Value Date   CREATININE 3.25 (H) 02/19/2023   Lab Results  Component Value Date   TSH 5.667 (H) 12/16/2022      Component Value Date/Time   CHOL 84 12/22/2022  0413   HDL 32 (L) 12/22/2022 0413   CHOLHDL 2.6 12/22/2022 0413   VLDL 12 12/22/2022 0413   LDLCALC 40 12/22/2022 0413    Lab Results  Component Value Date   AST 21 12/30/2022   Lab Results  Component Value Date   ALT 17 12/30/2022    Urinalysis *** I have reviewed the labs.  Pertinent imaging: N/A   Assessment & Plan:    1. Bladder cancer -Surveillance cystoscopy completed January 2025 along with urine cytology-both negative for recurrence  2.  Prostate cancer - PSA (09/2022) -demonstrates good biochemical response  3. BPH with retention -Currently managed with CIC, twice daily -return appointment in March for repeat BMP  4. Gross hematuria *** No follow-ups on file.  These notes generated with voice recognition software. I apologize for typographical errors.  Cloretta Ned  [facility] [address]  [address] [place], [place] [address] [phone]

## 2023-03-16 NOTE — Progress Notes (Signed)
03/16/2023 4:17 PM   [name] [birth year] [id number]  Referring provider: [name], MD [address] [facility] [place],  [place] [address]  Urological history: 1. 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  Intermediate risk adenocarcinoma prostate -PSA (09/2022) - 0.13 Treated IMRT+ ADT (2020)   3.  Radiation cystitis -Undergoing hyperbaric oxygen therapy  4. BPH with retention -prostate volume 65 cc (PET/CT 11/2022)  -cysto (02/2023) mild to moderate lateral lobe enlargement of the prostate   Chief Complaint  Patient presents with   Hematuria   HPI: [name] is a 78 y.o. male who presents today for hospital follow up with his wife, [name].    Previous records reviewed.   He has a history of bilateral hydronephrosis which was improved with indwelling Foley.  At his office visit for cystoscopy March 04, 2023 he had Dr. Lonna Cobb had a discussion on how to treat his incomplete bladder emptying and prevent further renal compromise by either having an indwelling Foley or proceeding with CIC.  He has chosen the latter.  He has also discussed that undergoing a HoLEP would likely leave him incontinent and it would not be recommended.  He came in on March 05, 2023 and was instructed on CIC.  He sent a MyChart message yesterday stating that he thought he may have come down with a UTI.  There have been a number of times where he spontaneously voided over the past 3 to 4 days and there was more blood in his urine and more slightly larger  clots.    He feels that he may have a urinary tract infection because of the blood in the urine.  He has had some dysuria this morning with the passage of clots.  Patient denies any modifying or aggravating factors.  Patient denies any recent UTI's, dysuria or suprapubic/flank pain.  Patient denies any fevers, chills, nausea or vomiting.    He brought in a urinal containing the urine he voided this morning at 11 AM.  It was a translucent, light salmon color with a clot the size of his thumbnail in the bottom.    He stated his urine has been bloody.  UA amber cloudy, specific every 1.020, pH 6.0, large heme, 100 protein, large leuks, greater than 50 WBCs, greater than 50 RBCs, many bacteria and WBC clumps present.  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 4v CABG on 04/13/2002. b.) LHC 02/03/2013 --> LVEF 50%; severe 3v CAD with patent LIMA-LAD and SVG-OM1 grafts; SVG-PDA and SVG-DG1 occluded with  collaterals to PDA and DG1 from LAD.   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   a.) LIMA-LAD, SVG-OM1, SVG-D1, SVG-PDA   Tubular adenoma of colon 02/03/2007    Surgical History: Past Surgical History:  Procedure Laterality Date   CARDIAC CATHETERIZATION Left 04/13/2002   Procedure: CARDIAC CATHETERIZATION;  Location: [facility]; Surgeon; Marcina Millard, MD   CARDIAC CATHETERIZATION Left 02/03/2013   Procedure: CARDIAC CATHETERIZATION; Location: [facility]; Surgeon: Marcina Millard, MD   COLONOSCOPY W/ POLYPECTOMY     COLONOSCOPY WITH PROPOFOL N/A 04/09/2015   Procedure: COLONOSCOPY WITH PROPOFOL;  Surgeon: Wallace Cullens, MD;  Location: [facility] ENDOSCOPY;  Service: Gastroenterology;  Laterality: N/A;   CORONARY ANGIOPLASTY     CORONARY ARTERY BYPASS GRAFT N/A 04/13/2002   Procedure: 4v CABG (LIMA-LAD, SVG-OM1, SVG-D1, SVG-PDA); Location: [facility]; Surgeon: Rana Snare, MD   CYSTOSCOPY N/A 07/17/2021   Procedure: Bluford Kaufmann;  Surgeon: [name], MD;  Location: [facility] ORS;  Service: Urology;  Laterality: N/A;   CYSTOSCOPY N/A 06/24/2022   Procedure: DIAGNOSTIC CYSTOSCOPY;  Surgeon: [name], MD;  Location: [facility] ORS;  Service: Urology;  Laterality: N/A;   CYSTOSCOPY WITH BIOPSY N/A 07/17/2021   Procedure: CYSTOSCOPY WITH BIOPSY;  Surgeon: [name], MD;  Location: [facility] ORS;  Service: Urology;  Laterality: N/A;   CYSTOSCOPY WITH BIOPSY N/A 06/24/2022   Procedure: CYSTOSCOPY WITH BLADDER BIOPSY;  Surgeon: [name], MD;  Location: [facility] ORS;  Service: Urology;  Laterality: N/A;   CYSTOSCOPY WITH FULGERATION N/A 07/17/2021   Procedure: CYSTOSCOPY WITH FULGERATION;  Surgeon: [name], MD;  Location: [facility] ORS;  Service: Urology;  Laterality: N/A;   CYSTOSCOPY WITH FULGERATION N/A 06/24/2022   Procedure: CYSTOSCOPY WITH FULGERATION;  Surgeon: [name], MD;  Location: [facility] ORS;  Service: Urology;  Laterality: N/A;   EYE SURGERY     both eyes with cataract with lens 2021   IR BONE MARROW BIOPSY & ASPIRATION  12/17/2022   TONSILLECTOMY     TRANSURETHRAL RESECTION OF BLADDER TUMOR N/A 11/13/2020   Procedure: TRANSURETHRAL RESECTION OF BLADDER TUMOR (TURBT);  Surgeon: [name], MD;  Location: [facility] ORS;  Service: Urology;  Laterality: N/A;   TRANSURETHRAL RESECTION OF BLADDER TUMOR N/A  06/24/2022   Procedure: TRANSURETHRAL RESECTION OF BLADDER TUMOR (TURBT);  Surgeon: [name], MD;  Location: [facility] ORS;  Service: Urology;  Laterality: N/A;   TRANSURETHRAL RESECTION OF BLADDER TUMOR WITH GYRUS (TURBT-GYRUS)  06/24/2022   TRANSURETHRAL RESECTION OF PROSTATE      Home Medications:  Allergies as of 03/16/2023       Reactions   Cidex [glutaral] Anaphylaxis   Other Anaphylaxis   - Ortho-phthalaldehyde  - trospion cause AMS        Medication List        Accurate as of March 16, 2023  4:17 PM. If you have any questions, ask your Johns or doctor.          STOP taking these medications    ferrous sulfate 324 MG Tbec Stopped by: Pernella Ackerley   furosemide 40 MG tablet Commonly known as: LASIX Stopped by: Darrol Brandenburg   metoprolol tartrate 25 MG tablet Commonly known as: LOPRESSOR Stopped by: Michiel Cowboy       TAKE  these medications    acetaminophen 500 MG tablet Commonly known as: TYLENOL Take 500 mg by mouth every 6 (six) hours as needed.   aspirin 81 MG tablet Take 81 mg by mouth daily.   atorvastatin 10 MG tablet Commonly known as: LIPITOR Take 10 mg by mouth daily.   cetirizine 10 MG tablet Commonly known as: ZYRTEC Take 10 mg by mouth at bedtime.   Coenzyme Q10 100 MG capsule Take 100 mg by mouth daily.   feeding supplement Liqd Take 237 mLs by mouth 3 (three) times daily between meals.   Fish Oil 1000 MG Caps Take 1,000 mg by mouth daily.   fluticasone 50 MCG/ACT nasal spray Commonly known as: FLONASE Place 1 spray into both nostrils as needed.   GLUCOSAMINE-CHONDROITIN Johns Take 1 tablet by mouth 2 (two) times daily.   Magnesium 200 MG Tabs Take 200 mg by mouth 2 (two) times daily.   Mega Probiotic Caps Take 1 capsule by mouth daily.   metroNIDAZOLE 0.75 % gel Commonly known as: METROGEL Apply 1 application topically 2 (two) times daily.   multivitamin tablet Take 1 tablet by mouth daily.    nitrofurantoin (macrocrystal-monohydrate) 100 MG capsule Commonly known as: MACROBID Take 1 capsule (100 mg total) by mouth every 12 (twelve) hours. Started by: Michiel Cowboy   nitroGLYCERIN 0.4 MG SL tablet Commonly known as: NITROSTAT Place under the tongue.   senna-docusate 8.6-50 MG tablet Commonly known as: Senokot-S Take 1 tablet by mouth 2 (two) times daily as needed for mild constipation.   silodosin 8 MG Caps capsule Commonly known as: RAPAFLO Take 1 capsule (8 mg total) by mouth daily with breakfast.   simethicone 80 MG chewable tablet Commonly known as: MYLICON Chew 1 tablet (80 mg total) by mouth 4 (four) times daily as needed for flatulence.   Vitamin A 2400 MCG (8000 UT) Caps Take 8,000 Units by mouth daily.   vitamin C 250 MG tablet Commonly known as: ASCORBIC ACID Take 250 mg by mouth 2 (two) times daily.   zinc gluconate 50 MG tablet Take 50 mg by mouth daily.        Allergies:  Allergies  Allergen Reactions   Cidex [Glutaral] Anaphylaxis   Other Anaphylaxis    - Ortho-phthalaldehyde  - trospion cause AMS    Family History: Family History  Problem Relation Age of Onset   Prostate cancer Father        Metastatic   Cancer Father     Social History:  reports that he quit smoking about 17 years ago. His smoking use included cigarettes. He started smoking about 77 years ago. He has a 45 pack-year smoking history. He has been exposed to tobacco smoke. He has never used smokeless tobacco. He reports current alcohol use of about 2.0 standard drinks of alcohol per week. He reports that he does not use drugs.  ROS: Pertinent ROS in HPI  Physical Exam: BP (!) 137/59 (BP Location: Left Arm, Patient Position: Sitting, Cuff Size: Normal)   Pulse 89   SpO2 100%   Constitutional:  Well nourished. Alert and oriented, No acute distress. HEENT: [place] AT, moist mucus membranes.  Trachea midline, no masses. Cardiovascular: No clubbing, cyanosis, or  edema. Respiratory: Normal respiratory effort, no increased work of breathing. GI: Large ventral hernia.   GU: No CVA tenderness.  No bladder fullness or masses.  Patient with circumcised phallus.  Urethral meatus is patent.  No penile discharge. No penile lesions or rashes. Scrotum without  lesions, cysts, rashes and/or edema.   Neurologic: Grossly intact, no focal deficits, moving all 4 extremities. Psychiatric: Normal mood and affect.   Laboratory Data: Lab Results  Component Value Date   WBC 7.6 12/30/2022   HGB 10.3 (L) 03/03/2023   HCT 33.5 (L) 03/03/2023   MCV 104.1 (H) 12/30/2022   PLT 309 12/30/2022    Lab Results  Component Value Date   CREATININE 3.25 (H) 02/19/2023   Lab Results  Component Value Date   TSH 5.667 (H) 12/16/2022      Component Value Date/Time   CHOL 84 12/22/2022 0413   HDL 32 (L) 12/22/2022 0413   CHOLHDL 2.6 12/22/2022 0413   VLDL 12 12/22/2022 0413   LDLCALC 40 12/22/2022 0413    Lab Results  Component Value Date   AST 21 12/30/2022   Lab Results  Component Value Date   ALT 17 12/30/2022    Urinalysis See EPIC and HPI I have reviewed the labs.  Pertinent imaging: N/A  Simple Catheter Placement Due to hematuria and clots patient is present today for a foley cath placement.  Patient was cleaned and prepped in a sterile fashion with betadine and 2% lidocaine jelly was instilled into the urethra. A 18 FR Coude foley catheter was inserted, urine return was noted  200 ml, urine was a light, milky salmon in color.  The balloon was filled with 10cc of sterile water.  I irrigated the catheter with 500 mL of sterile water and retrieved a scant amount of clot and achieved clear very faint yellow urine.  I then removed the coud catheter.   Patient tolerated well, no complications were noted   Performed by: Michiel Cowboy, PA-C    Assessment & Plan:    1. Bladder cancer -Surveillance cystoscopy completed January 2025 along with urine  cytology-both negative for recurrence  2.  Prostate cancer - PSA (09/2022) -demonstrates good biochemical response  3. BPH with retention -Currently managed with CIC, twice daily -return appointment in March for repeat BMP  4. Gross hematuria -UA with pyuria, hematuria and bacteriuria -urine culture pending -1 g of Rocephin IM was given in office -Prescription for Macrobid twice daily sent to pharmacy for 7 days, will adjust if necessary once culture results are available  Return for Keep follow up with Dr. Lonna Cobb .  These notes generated with voice recognition software. I apologize for typographical errors.  Cloretta Ned  [facility] [address]  [address] [place], [place] [address] [phone]

## 2023-03-18 ENCOUNTER — Encounter: Payer: Self-pay | Admitting: Urology

## 2023-03-19 LAB — URINE CULTURE: Culture: >=100000 — AB

## 2023-03-23 ENCOUNTER — Ambulatory Visit: Payer: Medicare Other | Admitting: Oncology

## 2023-03-23 ENCOUNTER — Other Ambulatory Visit: Payer: Medicare Other

## 2023-03-24 ENCOUNTER — Inpatient Hospital Stay: Payer: Medicare Other

## 2023-03-24 ENCOUNTER — Inpatient Hospital Stay: Payer: Medicare Other | Attending: Radiation Oncology

## 2023-03-24 VITALS — BP 129/70

## 2023-03-24 DIAGNOSIS — D631 Anemia in chronic kidney disease: Secondary | ICD-10-CM | POA: Diagnosis present

## 2023-03-24 DIAGNOSIS — N184 Chronic kidney disease, stage 4 (severe): Secondary | ICD-10-CM | POA: Diagnosis present

## 2023-03-24 DIAGNOSIS — Z8042 Family history of malignant neoplasm of prostate: Secondary | ICD-10-CM | POA: Diagnosis not present

## 2023-03-24 DIAGNOSIS — R5383 Other fatigue: Secondary | ICD-10-CM | POA: Insufficient documentation

## 2023-03-24 DIAGNOSIS — Z8546 Personal history of malignant neoplasm of prostate: Secondary | ICD-10-CM | POA: Insufficient documentation

## 2023-03-24 DIAGNOSIS — Z87891 Personal history of nicotine dependence: Secondary | ICD-10-CM | POA: Insufficient documentation

## 2023-03-24 DIAGNOSIS — R768 Other specified abnormal immunological findings in serum: Secondary | ICD-10-CM

## 2023-03-24 LAB — HEMOGLOBIN AND HEMATOCRIT (CANCER CENTER ONLY)
HCT: 30.7 % — ABNORMAL LOW (ref 39.0–52.0)
Hemoglobin: 9.4 g/dL — ABNORMAL LOW (ref 13.0–17.0)

## 2023-03-24 MED ORDER — EPOETIN ALFA-EPBX 40000 UNIT/ML IJ SOLN
40000.0000 [IU] | INTRAMUSCULAR | Status: DC
Start: 2023-03-24 — End: 2023-03-24
  Administered 2023-03-24: 40000 [IU] via SUBCUTANEOUS
  Filled 2023-03-24: qty 1

## 2023-03-29 ENCOUNTER — Other Ambulatory Visit: Payer: Self-pay | Admitting: Physician Assistant

## 2023-03-29 DIAGNOSIS — N401 Enlarged prostate with lower urinary tract symptoms: Secondary | ICD-10-CM

## 2023-04-07 ENCOUNTER — Ambulatory Visit: Payer: Medicare Other | Admitting: Urology

## 2023-04-07 ENCOUNTER — Encounter: Payer: Self-pay | Admitting: Urology

## 2023-04-07 VITALS — BP 143/72 | HR 92 | Ht 66.0 in | Wt 141.0 lb

## 2023-04-07 DIAGNOSIS — R350 Frequency of micturition: Secondary | ICD-10-CM | POA: Diagnosis not present

## 2023-04-07 DIAGNOSIS — R310 Gross hematuria: Secondary | ICD-10-CM

## 2023-04-07 DIAGNOSIS — R339 Retention of urine, unspecified: Secondary | ICD-10-CM

## 2023-04-07 DIAGNOSIS — N401 Enlarged prostate with lower urinary tract symptoms: Secondary | ICD-10-CM | POA: Diagnosis not present

## 2023-04-07 LAB — URINALYSIS, COMPLETE
Bilirubin, UA: NEGATIVE
Glucose, UA: NEGATIVE
Ketones, UA: NEGATIVE
Nitrite, UA: POSITIVE — AB
Specific Gravity, UA: 1.02 (ref 1.005–1.030)
Urobilinogen, Ur: 0.2 mg/dL (ref 0.2–1.0)
pH, UA: 6 (ref 5.0–7.5)

## 2023-04-07 LAB — MICROSCOPIC EXAMINATION
RBC, Urine: >30 /[HPF] — AB (ref 0–2)
WBC, UA: >30 /[HPF] — AB (ref 0–5)

## 2023-04-07 MED ORDER — FINASTERIDE 5 MG PO TABS: 5.0000 mg | ORAL_TABLET | Freq: Every day | ORAL | 3 refills | Status: DC

## 2023-04-07 MED ORDER — CEFUROXIME AXETIL 500 MG PO TABS: 500.0000 mg | ORAL_TABLET | Freq: Two times a day (BID) | ORAL | 0 refills | Status: DC

## 2023-04-07 NOTE — Progress Notes (Signed)
04/07/2023 11:25 AM   [name] 29, 1947 [id number]  Referring provider: [name], MD [address] [facility] [address] [facility] [place],  [place] [address]  Urological history: 1. 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  Intermediate risk adenocarcinoma prostate -PSA (09/2022) - 0.13 Treated IMRT+ ADT (2020)   3.  Radiation cystitis -Undergoing hyperbaric oxygen therapy  4. BPH with retention -prostate volume 65 cc (PET/CT 11/2022)  -cysto (02/2023) mild to moderate lateral lobe enlargement of the prostate   Chief Complaint  Patient presents with   Other    Possible uti   HPI: [name] is a 78 y.o. male who presents today for possible UTI with his wife, [name].    Previous records reviewed.   He has a history of bilateral hydronephrosis which was improved with indwelling Foley.  At his office visit for cystoscopy March 04, 2023 he had Dr. Lonna Cobb had a discussion on how to treat his incomplete bladder emptying and prevent further renal compromise by either having an indwelling Foley or proceeding with CIC.  He has chosen the latter.  He has also discussed that undergoing a HoLEP would likely leave him incontinent and it would not be recommended.  He came in on March 05, 2023 and was instructed on CIC.  He was seen by nephrology on February 11 and his urinalysis that time was yellow clear, specific gravity 1.010, pH 6.5, 3+ heme, 1+ protein, 2+ leuks, greater than 60 WBCs and 0-2 RBCs.  Urine culture was negative.  He has been having  several weeks of suprapubic pain.  He continues to have intermittent gross hematuria with passage of clots.  He states the typical pattern is he caths himself and then when he spontaneously voids he will see the blood and clots within next void after catheterization, but now he has gross heme and clots with spontaneous voids where he has not self catheterize.  He feels that he is more sensitive to cold.  Patient denies any modifying or aggravating factors.  Patient denies any recent UTI's, gross hematuria, dysuria or suprapubic/flank pain.  Patient denies any fevers, chills, nausea or vomiting.    UA yellow cloudy, specific gravity 1.020, 3+ heme, pH 6.0, 2+ protein, nitrite positive, 3+ leukocyte, greater than 30 WBCs, greater than 30 RBCs, 0-10 epithelial cells and many bacteria.     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 4v CABG on 04/13/2002. b.) LHC 02/03/2013 --> LVEF 50%; severe 3v CAD with patent LIMA-LAD and SVG-OM1 grafts; SVG-PDA and SVG-DG1 occluded with collaterals  to PDA and DG1 from LAD.   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   a.) LIMA-LAD, SVG-OM1, SVG-D1, SVG-PDA   Tubular adenoma of colon 02/03/2007    Surgical History: Past Surgical History:  Procedure Laterality Date   CARDIAC  CATHETERIZATION Left 04/13/2002   Procedure: CARDIAC CATHETERIZATION; Location: [facility]; Surgeon; Marcina Millard, MD   CARDIAC CATHETERIZATION Left 02/03/2013   Procedure: CARDIAC CATHETERIZATION; Location: [facility]; Surgeon: Marcina Millard, MD   COLONOSCOPY W/ POLYPECTOMY     COLONOSCOPY WITH PROPOFOL N/A 04/09/2015   Procedure: COLONOSCOPY WITH PROPOFOL;  Surgeon: Wallace Cullens, MD;  Location: [facility] ENDOSCOPY;  Service: Gastroenterology;  Laterality: N/A;   CORONARY ANGIOPLASTY     CORONARY ARTERY BYPASS GRAFT N/A 04/13/2002   Procedure: 4v CABG (LIMA-LAD, SVG-OM1, SVG-D1, SVG-PDA); Location: [facility]; Surgeon: Rana Snare, MD   CYSTOSCOPY N/A 07/17/2021   Procedure: Bluford Kaufmann;  Surgeon: [name], MD;  Location: [facility] ORS;  Service: Urology;  Laterality: N/A;   CYSTOSCOPY N/A 06/24/2022   Procedure: DIAGNOSTIC CYSTOSCOPY;  Surgeon: [name], MD;  Location: [facility] ORS;  Service: Urology;  Laterality: N/A;   CYSTOSCOPY WITH BIOPSY N/A 07/17/2021   Procedure: CYSTOSCOPY WITH BIOPSY;  Surgeon: [name], MD;  Location: [facility] ORS;  Service: Urology;  Laterality: N/A;   CYSTOSCOPY WITH BIOPSY N/A 06/24/2022   Procedure: CYSTOSCOPY WITH BLADDER BIOPSY;  Surgeon: [name], MD;  Location: [facility] ORS;  Service: Urology;  Laterality: N/A;   CYSTOSCOPY WITH FULGERATION N/A 07/17/2021   Procedure: CYSTOSCOPY WITH FULGERATION;  Surgeon: [name], MD;  Location: [facility] ORS;  Service: Urology;  Laterality: N/A;   CYSTOSCOPY WITH FULGERATION N/A 06/24/2022   Procedure: CYSTOSCOPY WITH FULGERATION;  Surgeon: [name], MD;  Location: [facility] ORS;  Service: Urology;  Laterality: N/A;   EYE SURGERY     both eyes with cataract with lens 2021   IR BONE MARROW BIOPSY & ASPIRATION  12/17/2022   TONSILLECTOMY     TRANSURETHRAL RESECTION OF BLADDER TUMOR N/A 11/13/2020   Procedure: TRANSURETHRAL RESECTION OF BLADDER TUMOR (TURBT);  Surgeon: [name], MD;  Location: [facility] ORS;  Service:  Urology;  Laterality: N/A;   TRANSURETHRAL RESECTION OF BLADDER TUMOR N/A 06/24/2022   Procedure: TRANSURETHRAL RESECTION OF BLADDER TUMOR (TURBT);  Surgeon: [name], MD;  Location: [facility] ORS;  Service: Urology;  Laterality: N/A;   TRANSURETHRAL RESECTION OF BLADDER TUMOR WITH GYRUS (TURBT-GYRUS)  06/24/2022   TRANSURETHRAL RESECTION OF PROSTATE      Home Medications:  Allergies as of 04/07/2023       Reactions   Cidex [glutaral] Anaphylaxis   Other Anaphylaxis   - Ortho-phthalaldehyde  - trospion cause AMS        Medication List        Accurate as of April 07, 2023 11:25 AM. If you have any questions, ask your nurse or doctor.          acetaminophen 500 MG tablet Commonly known as: TYLENOL Take 500 mg by mouth every 6 (six) hours as needed.   aspirin 81 MG tablet Take 81 mg by mouth daily.   atorvastatin 10 MG tablet Commonly known as: LIPITOR Take 10 mg by mouth daily.   cefUROXime 500 MG tablet Commonly  known as: CEFTIN Take 1 tablet (500 mg total) by mouth 2 (two) times daily with a meal.   cetirizine 10 MG tablet Commonly known as: ZYRTEC Take 10 mg by mouth at bedtime.   Coenzyme Q10 100 MG capsule Take 100 mg by mouth daily.   feeding supplement Liqd Take 237 mLs by mouth 3 (three) times daily between meals.   finasteride 5 MG tablet Commonly known as: PROSCAR Take 1 tablet (5 mg total) by mouth daily.   Fish Oil 1000 MG Caps Take 1,000 mg by mouth daily.   fluticasone 50 MCG/ACT nasal spray Commonly known as: FLONASE Place 1 spray into both nostrils as needed.   GLUCOSAMINE-CHONDROITIN PO Take 1 tablet by mouth 2 (two) times daily.   Magnesium 200 MG Tabs Take 200 mg by mouth 2 (two) times daily.   Mega Probiotic Caps Take 1 capsule by mouth daily.   metroNIDAZOLE 0.75 % gel Commonly known as: METROGEL Apply 1 application topically 2 (two) times daily.   multivitamin tablet Take 1 tablet by mouth daily.   nitrofurantoin  (macrocrystal-monohydrate) 100 MG capsule Commonly known as: MACROBID Take 1 capsule (100 mg total) by mouth every 12 (twelve) hours.   nitroGLYCERIN 0.4 MG SL tablet Commonly known as: NITROSTAT Place under the tongue.   senna-docusate 8.6-50 MG tablet Commonly known as: Senokot-S Take 1 tablet by mouth 2 (two) times daily as needed for mild constipation.   silodosin 8 MG Caps capsule Commonly known as: RAPAFLO TAKE 1 CAPSULE BY MOUTH DAILY WITH BREAKFAST   simethicone 80 MG chewable tablet Commonly known as: MYLICON Chew 1 tablet (80 mg total) by mouth 4 (four) times daily as needed for flatulence.   Vitamin A 2400 MCG (8000 UT) Caps Take 8,000 Units by mouth daily.   vitamin C 250 MG tablet Commonly known as: ASCORBIC ACID Take 250 mg by mouth 2 (two) times daily.   zinc gluconate 50 MG tablet Take 50 mg by mouth daily.        Allergies:  Allergies  Allergen Reactions   Cidex [Glutaral] Anaphylaxis   Other Anaphylaxis    - Ortho-phthalaldehyde  - trospion cause AMS    Family History: Family History  Problem Relation Age of Onset   Prostate cancer Father        Metastatic   Cancer Father     Social History:  reports that he quit smoking about 17 years ago. His smoking use included cigarettes. He started smoking about 77 years ago. He has a 45 pack-year smoking history. He has been exposed to tobacco smoke. He has never used smokeless tobacco. He reports current alcohol use of about 2.0 standard drinks of alcohol per week. He reports that he does not use drugs.  ROS: Pertinent ROS in HPI  Physical Exam: BP (!) 143/72   Pulse 92   Ht 5\' 6"  (1.676 m)   Wt 141 lb (64 kg)   BMI 22.76 kg/m   Constitutional:  Well nourished. Alert and oriented, No acute distress. HEENT: [place] AT, moist mucus membranes.  Trachea midline, no masses. Cardiovascular: No clubbing, cyanosis, or edema. Respiratory: Normal respiratory effort, no increased work of  breathing. Neurologic: Grossly intact, no focal deficits, moving all 4 extremities. Psychiatric: Normal mood and affect.   Laboratory Data: ontains abnormal data Renal Function Panel Order: [id number] Component Ref Range & Units 7 d ago  Glucose 65 - 99 mg/dL 97  Comment:  Fasting reference interval                                           BUN 7 - 25 mg/dL 51 High   Creatinine 0.98 - 1.28 mg/dL 1.19 High   eGFR CKD-EPI CR 2021 > OR = 60 mL/min/1.103m2 18 Low   BUN/Creatinine Ratio 6 - 22 (calc) 16  Sodium 135 - 146 mmol/L 137  Potassium 3.5 - 5.3 mmol/L 4.9  Chloride 98 - 110 mmol/L 112 High   Comment:                                         Verified by repeat analysis.                                           Bicarbonate (CO2) 20 - 32 mmol/L 20  Calcium 8.6 - 10.3 mg/dL 9.1  Phosphorus 2.1 - 4.3 mg/dL 5.1 High   Albumin 3.6 - 5.1 g/dL 4  Resulting Agency See order comments   Specimen Collected: 03/31/23 11:07   Performed by: Chancy Hurter Last Resulted: 04/02/23 04:58  Received From: [facility]  Result Received: 04/02/23 14:22   CBC and Differential Order: [id number] Component Ref Range & Units 7 d ago  WBC 3.8 - 10.8 Thousand/uL 8.4  RBC 4.20 - 5.80 Million/uL 3.16 Low   Hemoglobin 13.2 - 17.1 g/dL 9.8 Low   Hematocrit 13.0 - 50.0 % 31.3 Low   MCV 80.0 - 100.0 fL 99.1  MCH 27.0 - 33.0 pg 31  MCHC 32.0 - 36.0 g/dL 86.5 Low   Comment:      For adults, a slight decrease in the calculated MCHC      value (in the range of 30 to 32 g/dL) is most likely      not clinically significant; however, it should be      interpreted with caution in correlation with other      red cell parameters and the patient's clinical      condition.  RDW 11.0 - 15.0 % 15.8 High   Platelets 140 - 400 Thousand/uL 329  MPV 7.5 - 12.5 fL 9.7  Neutrophils Absolute 1500 - 7800  cells/uL 6,191  Band Neutrophils Absolute, Manual Count 0 - 750 cells/uL CANCELED  Comment: Result canceled by the ancillary.  Metamyelocytes Absolute 0 cells/uL CANCELED  Comment: Result canceled by the ancillary.  Absolute Myelocytes 0 cells/uL CANCELED  Comment: Result canceled by the ancillary.  Absolute Promyelocytes 0 cells/uL CANCELED  Comment: Result canceled by the ancillary.  Lymphocytes Absolute 850 - 3900 cells/uL 1,260  Monocytes Absolute 200 - 950 cells/uL 638  Eosinophils Absolute 15 - 500 cells/uL 227  Basophils Absolute 0 - 200 cells/uL 84  Blasts Absolute 0 cells/uL CANCELED  Comment: Result canceled by the ancillary.  NRBC Absolute 0 cells/uL CANCELED  Comment: Result canceled by the ancillary.  Neutrophils Relative % 73.7  Bands Absolute % CANCELED  Comment: Result canceled by the ancillary.  Metamyelocytes Percent % CANCELED  Comment: Result canceled by the ancillary.  Myelocytes Relative % CANCELED  Comment: Result canceled by the ancillary.  Promyelocytes Relative % CANCELED  Comment: Result canceled  by the ancillary.  Lymphocytes % 15  Variant lymphocytes/100 WBC (Bld) 0 - 10 % CANCELED  Comment: Result canceled by the ancillary.  Monocytes % 7.6  Eosinophils % 2.7  Basophils Relative % 1  Blasts % CANCELED  Comment: Result canceled by the ancillary.  nRBC 0 /100 WBC CANCELED  Comment: Result canceled by the ancillary.  Comment(s) CANCELED  Comment: Result canceled by the ancillary.  Resulting Agency See order comments   Specimen Collected: 03/31/23 11:07   Performed by: Chancy Hurter Last Resulted: 04/02/23 04:58  Received From: [facility]  Result Received: 04/02/23 14:22  Urinalysis See EPIC and HPI I have reviewed the labs.  Pertinent imaging: N/A   Assessment & Plan:    1. Bladder cancer -Surveillance cystoscopy completed January 2025 along with urine cytology-both negative for recurrence  2.  Prostate  cancer - PSA (09/2022) -demonstrates good biochemical response  3. BPH with retention -Currently managed with CIC, twice daily -I will start finasteride 5 mg daily to help address his BPH and hopefully reduce hematuria instances  -return appointment in March for repeat BMP  4. Gross hematuria -UA appears infected -urine culture pending -started on Ceftin 500 mg twice daily x 7 days, will adjust if necessary once urine culture results are available -After he completes culture appropriate antibiotics, we will start Hiprex 1 g twice daily  Return for pending urine culture results .  These notes generated with voice recognition software. I apologize for typographical errors.  Cloretta Ned  [facility] [address]  [address] [place], [place] [address] [phone]

## 2023-04-10 LAB — CULTURE, URINE COMPREHENSIVE

## 2023-04-14 ENCOUNTER — Inpatient Hospital Stay: Payer: Medicare Other

## 2023-04-14 DIAGNOSIS — N184 Chronic kidney disease, stage 4 (severe): Secondary | ICD-10-CM | POA: Diagnosis not present

## 2023-04-14 DIAGNOSIS — R768 Other specified abnormal immunological findings in serum: Secondary | ICD-10-CM

## 2023-04-14 DIAGNOSIS — D631 Anemia in chronic kidney disease: Secondary | ICD-10-CM

## 2023-04-14 LAB — IRON AND TIBC
Iron: 95 ug/dL (ref 45–182)
Saturation Ratios: 35 % (ref 17.9–39.5)
TIBC: 274 ug/dL (ref 250–450)
UIBC: 179 ug/dL

## 2023-04-14 LAB — HEMOGLOBIN AND HEMATOCRIT (CANCER CENTER ONLY)
HCT: 33.4 % — ABNORMAL LOW (ref 39.0–52.0)
Hemoglobin: 10.4 g/dL — ABNORMAL LOW (ref 13.0–17.0)

## 2023-04-14 LAB — FERRITIN: Ferritin: 136 ng/mL (ref 24–336)

## 2023-04-14 NOTE — Progress Notes (Signed)
 S 10.4; will hold retacrit today

## 2023-04-16 ENCOUNTER — Other Ambulatory Visit: Payer: Medicare Other

## 2023-04-16 DIAGNOSIS — R339 Retention of urine, unspecified: Secondary | ICD-10-CM

## 2023-04-17 LAB — BASIC METABOLIC PANEL
BUN/Creatinine Ratio: 18 (ref 10–24)
BUN: 65 mg/dL — ABNORMAL HIGH (ref 8–27)
CO2: 16 mmol/L — ABNORMAL LOW (ref 20–29)
Calcium: 9.2 mg/dL (ref 8.6–10.2)
Chloride: 107 mmol/L — ABNORMAL HIGH (ref 96–106)
Creatinine, Ser: 3.59 mg/dL — ABNORMAL HIGH (ref 0.76–1.27)
Glucose: 93 mg/dL (ref 70–99)
Sodium: 137 mmol/L (ref 134–144)
eGFR: 17 mL/min/{1.73_m2} — ABNORMAL LOW (ref >59)

## 2023-04-22 ENCOUNTER — Ambulatory Visit (INDEPENDENT_AMBULATORY_CARE_PROVIDER_SITE_OTHER): Payer: Medicare Other | Admitting: Urology

## 2023-04-22 ENCOUNTER — Encounter: Payer: Self-pay | Admitting: Urology

## 2023-04-22 VITALS — BP 153/57 | HR 90 | Ht 69.0 in | Wt 141.0 lb

## 2023-04-22 DIAGNOSIS — R3915 Urgency of urination: Secondary | ICD-10-CM

## 2023-04-22 DIAGNOSIS — R339 Retention of urine, unspecified: Secondary | ICD-10-CM

## 2023-04-22 DIAGNOSIS — N1330 Unspecified hydronephrosis: Secondary | ICD-10-CM | POA: Diagnosis not present

## 2023-04-22 NOTE — Progress Notes (Signed)
 I, [name], acting as a scribe for [name], MD., have documented all relevant documentation on the behalf of [name], MD, as directed by [name], MD while in the presence of [name], MD.  04/22/2023 3:40 PM   [name] Nov 13, 1945 [id number]  Referring provider: [name], MD [address] [facility] [place],  [place] [address]  Chief Complaint  Patient presents with   Other   Urological history: 1. 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  Radiation cystitis hyperbaric oxygen therapy  HPI: [name] is a 78 y.o. male presents for follow-up visit.  Found to have elevated creatinine 4.56 December 2024. PVR of 245 cc and Foley catheter was placed. Follow-up ultrasound. after endwelling catheter showed mild to moderate bilateral hydronephrosis. Hydendrophosis was improved after catheter drainage but still present to a mild degree.  He is catheterizing every 12 hours. Notes intermittent hematuria. Last labs 04/16/23 remarkable for creatinine 3.59 with EGFR 17- previously 3.25/19 02/19/23.  He has an appointment with Dr. Wynelle Link today  Does have nocturnal enuresis and intermittent daytime urge incontinence.   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 4v CABG on 04/13/2002. b.) LHC 02/03/2013 --> LVEF 50%; severe 3v CAD with patent LIMA-LAD and SVG-OM1 grafts; SVG-PDA and SVG-DG1 occluded with collaterals to PDA and DG1 from LAD.   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   a.) LIMA-LAD, SVG-OM1, SVG-D1, SVG-PDA   Tubular adenoma of colon 02/03/2007    Surgical History: Past Surgical History:  Procedure Laterality Date   CARDIAC CATHETERIZATION Left 04/13/2002   Procedure: CARDIAC CATHETERIZATION; Location: [facility]; Surgeon; Marcina Millard, MD   CARDIAC CATHETERIZATION Left 02/03/2013   Procedure: CARDIAC CATHETERIZATION; Location: [facility]; Surgeon: Marcina Millard, MD   COLONOSCOPY W/ POLYPECTOMY     COLONOSCOPY WITH PROPOFOL N/A 04/09/2015   Procedure: COLONOSCOPY WITH PROPOFOL;  Surgeon: Wallace Cullens, MD;  Location: [facility] ENDOSCOPY;  Service: Gastroenterology;  Laterality: N/A;   CORONARY ANGIOPLASTY     CORONARY ARTERY BYPASS GRAFT N/A 04/13/2002   Procedure: 4v CABG (LIMA-LAD, SVG-OM1, SVG-D1, SVG-PDA); Location: [facility]; Surgeon: Rana Snare, MD   CYSTOSCOPY N/A 07/17/2021   Procedure: Bluford Kaufmann;  Surgeon: [name], MD;  Location: [facility] ORS;  Service: Urology;  Laterality: N/A;   CYSTOSCOPY N/A 06/24/2022   Procedure: DIAGNOSTIC CYSTOSCOPY;  Surgeon: [name], MD;  Location: [facility] ORS;  Service: Urology;  Laterality: N/A;   CYSTOSCOPY WITH BIOPSY N/A 07/17/2021   Procedure: CYSTOSCOPY WITH BIOPSY;  Surgeon: [name], MD;  Location: [facility] ORS;  Service: Urology;   Laterality: N/A;   CYSTOSCOPY WITH BIOPSY N/A 06/24/2022   Procedure: CYSTOSCOPY WITH BLADDER BIOPSY;  Surgeon: [name], MD;  Location: [facility] ORS;  Service: Urology;  Laterality: N/A;   CYSTOSCOPY WITH FULGERATION N/A 07/17/2021   Procedure: CYSTOSCOPY WITH FULGERATION;  Surgeon: [name], MD;  Location: [facility] ORS;  Service: Urology;  Laterality: N/A;   CYSTOSCOPY WITH FULGERATION N/A 06/24/2022   Procedure: CYSTOSCOPY WITH FULGERATION;  Surgeon: [name], MD;  Location: [facility] ORS;  Service: Urology;  Laterality: N/A;   EYE SURGERY     both eyes with cataract with lens 2021   IR BONE MARROW BIOPSY & ASPIRATION  12/17/2022   TONSILLECTOMY     TRANSURETHRAL RESECTION OF BLADDER TUMOR N/A 11/13/2020   Procedure: TRANSURETHRAL RESECTION OF BLADDER TUMOR (TURBT);  Surgeon: [name], MD;  Location: [facility] ORS;  Service: Urology;  Laterality: N/A;   TRANSURETHRAL RESECTION OF BLADDER TUMOR N/A 06/24/2022   Procedure: TRANSURETHRAL RESECTION OF BLADDER TUMOR (TURBT);  Surgeon: [name], MD;  Location: [facility] ORS;  Service: Urology;  Laterality: N/A;   TRANSURETHRAL RESECTION OF BLADDER TUMOR WITH GYRUS (TURBT-GYRUS)  06/24/2022   TRANSURETHRAL RESECTION OF PROSTATE      Home Medications:  Allergies as of 04/22/2023       Reactions   Cidex [glutaral] Anaphylaxis   Other Anaphylaxis   - Ortho-phthalaldehyde  - trospion cause AMS        Medication List        Accurate as of April 22, 2023  3:40 PM. If you have any questions, ask your nurse or doctor.          acetaminophen 500 MG tablet Commonly known as: TYLENOL Take 500 mg by mouth every 6 (six) hours as needed.   aspirin 81 MG tablet Take 81 mg by mouth daily.   atorvastatin 10 MG tablet Commonly known as: LIPITOR Take 10 mg by mouth daily.   cefUROXime 500 MG tablet Commonly known as: CEFTIN Take 1 tablet (500 mg total) by mouth 2 (two) times daily with a meal.   cetirizine 10 MG  tablet Commonly known as: ZYRTEC Take 10 mg by mouth at bedtime.   Coenzyme Q10 100 MG capsule Take 100 mg by mouth daily.   feeding supplement Liqd Take 237 mLs by mouth 3 (three) times daily between meals.   finasteride 5 MG tablet Commonly known as: PROSCAR Take 1 tablet (5 mg total) by mouth daily.   Fish Oil 1000 MG Caps Take 1,000 mg by mouth daily.   fluticasone 50 MCG/ACT nasal spray Commonly known as: FLONASE Place 1 spray into both nostrils as needed.   GLUCOSAMINE-CHONDROITIN PO Take 1 tablet by mouth 2 (two) times daily.   Magnesium 200 MG Tabs Take 200 mg by mouth 2 (two) times daily.   Mega Probiotic Caps Take 1 capsule by mouth daily.   metroNIDAZOLE 0.75 % gel Commonly known as: METROGEL Apply 1 application topically 2 (two) times daily.   multivitamin tablet Take 1 tablet by mouth daily.   nitrofurantoin (macrocrystal-monohydrate) 100 MG capsule Commonly known as: MACROBID Take 1 capsule (100 mg total)  by mouth every 12 (twelve) hours.   nitroGLYCERIN 0.4 MG SL tablet Commonly known as: NITROSTAT Place under the tongue.   senna-docusate 8.6-50 MG tablet Commonly known as: Senokot-S Take 1 tablet by mouth 2 (two) times daily as needed for mild constipation.   silodosin 8 MG Caps capsule Commonly known as: RAPAFLO TAKE 1 CAPSULE BY MOUTH DAILY WITH BREAKFAST   simethicone 80 MG chewable tablet Commonly known as: MYLICON Chew 1 tablet (80 mg total) by mouth 4 (four) times daily as needed for flatulence.   Vitamin A 2400 MCG (8000 UT) Caps Take 8,000 Units by mouth daily.   vitamin C 250 MG tablet Commonly known as: ASCORBIC ACID Take 250 mg by mouth 2 (two) times daily.   zinc gluconate 50 MG tablet Take 50 mg by mouth daily.        Allergies:  Allergies  Allergen Reactions   Cidex [Glutaral] Anaphylaxis   Other Anaphylaxis    - Ortho-phthalaldehyde  - trospion cause AMS    Family History: Family History  Problem Relation  Age of Onset   Prostate cancer Father        Metastatic   Cancer Father     Social History:  reports that he quit smoking about 17 years ago. His smoking use included cigarettes. He started smoking about 77 years ago. He has a 45 pack-year smoking history. He has been exposed to tobacco smoke. He has never used smokeless tobacco. He reports current alcohol use of about 2.0 standard drinks of alcohol per week. He reports that he does not use drugs.   Physical Exam: BP (!) 153/57   Pulse 90   Ht 5\' 9"  (1.753 m)   Wt 141 lb (64 kg)   BMI 20.82 kg/m   Constitutional:  Alert and oriented, No acute distress. HEENT: [place] AT Cardiovascular: No clubbing, cyanosis, or edema. Respiratory: Normal respiratory effort, no increased work of breathing. Psychiatric: Normal mood and affect.   Assessment & Plan:    1. Bilateral hydropnephrosis Will schedule follow-up renal ultrasound and if hydropnephrosis worsening, schedule cystoscopy with bilateral retrograde pyelograms. No previous surgery that would predispose to ureteral obstruction, though he has had previous radiation for prostate cancer. Continue intermittent catheterization.  I have reviewed the above documentation for accuracy and completeness, and I agree with the above.   [name], MD  [facility] [address], [address] [place], [place] [address] [phone]

## 2023-04-27 ENCOUNTER — Ambulatory Visit
Admission: RE | Admit: 2023-04-27 | Discharge: 2023-04-27 | Disposition: A | Discharge status: Home / Self Care | Source: Ambulatory Visit | Attending: Urology | Admitting: Urology

## 2023-04-27 DIAGNOSIS — N1330 Unspecified hydronephrosis: Secondary | ICD-10-CM | POA: Diagnosis present

## 2023-05-04 ENCOUNTER — Other Ambulatory Visit: Payer: Self-pay

## 2023-05-04 DIAGNOSIS — D631 Anemia in chronic kidney disease: Secondary | ICD-10-CM

## 2023-05-05 ENCOUNTER — Inpatient Hospital Stay: Payer: Medicare Other | Attending: Radiation Oncology

## 2023-05-05 ENCOUNTER — Inpatient Hospital Stay: Payer: Medicare Other

## 2023-05-05 VITALS — BP 126/63

## 2023-05-05 DIAGNOSIS — N184 Chronic kidney disease, stage 4 (severe): Secondary | ICD-10-CM | POA: Diagnosis present

## 2023-05-05 DIAGNOSIS — Z8042 Family history of malignant neoplasm of prostate: Secondary | ICD-10-CM | POA: Diagnosis not present

## 2023-05-05 DIAGNOSIS — D631 Anemia in chronic kidney disease: Secondary | ICD-10-CM

## 2023-05-05 DIAGNOSIS — Z8546 Personal history of malignant neoplasm of prostate: Secondary | ICD-10-CM | POA: Insufficient documentation

## 2023-05-05 DIAGNOSIS — R5383 Other fatigue: Secondary | ICD-10-CM | POA: Diagnosis not present

## 2023-05-05 DIAGNOSIS — Z87891 Personal history of nicotine dependence: Secondary | ICD-10-CM | POA: Diagnosis not present

## 2023-05-05 LAB — HEMOGLOBIN AND HEMATOCRIT, BLOOD
HCT: 29.2 % — ABNORMAL LOW (ref 39.0–52.0)
Hemoglobin: 9 g/dL — ABNORMAL LOW (ref 13.0–17.0)

## 2023-05-05 MED ORDER — EPOETIN ALFA-EPBX 40000 UNIT/ML IJ SOLN
40000.0000 [IU] | INTRAMUSCULAR | Status: DC
  Administered 2023-05-05: 40000 [IU] via SUBCUTANEOUS
  Filled 2023-05-05: qty 1

## 2023-05-15 ENCOUNTER — Inpatient Hospital Stay: Payer: Medicare Other | Admitting: Oncology

## 2023-05-15 ENCOUNTER — Other Ambulatory Visit: Payer: Self-pay | Admitting: Urology

## 2023-05-15 ENCOUNTER — Inpatient Hospital Stay: Payer: Medicare Other

## 2023-05-15 ENCOUNTER — Encounter: Payer: Self-pay | Admitting: Urology

## 2023-05-15 VITALS — BP 121/64 | HR 77 | Temp 96.6°F | Resp 19 | Ht 69.0 in | Wt 143.5 lb

## 2023-05-15 DIAGNOSIS — R768 Other specified abnormal immunological findings in serum: Secondary | ICD-10-CM

## 2023-05-15 DIAGNOSIS — N184 Chronic kidney disease, stage 4 (severe): Secondary | ICD-10-CM | POA: Diagnosis not present

## 2023-05-15 DIAGNOSIS — Z79899 Other long term (current) drug therapy: Secondary | ICD-10-CM | POA: Diagnosis not present

## 2023-05-15 DIAGNOSIS — D472 Monoclonal gammopathy: Secondary | ICD-10-CM

## 2023-05-15 DIAGNOSIS — Z8551 Personal history of malignant neoplasm of bladder: Secondary | ICD-10-CM

## 2023-05-15 DIAGNOSIS — N1330 Unspecified hydronephrosis: Secondary | ICD-10-CM

## 2023-05-15 DIAGNOSIS — D649 Anemia, unspecified: Secondary | ICD-10-CM

## 2023-05-15 DIAGNOSIS — D631 Anemia in chronic kidney disease: Secondary | ICD-10-CM

## 2023-05-15 LAB — COMPREHENSIVE METABOLIC PANEL WITH GFR
ALT: 11 U/L (ref 0–44)
AST: 14 U/L — ABNORMAL LOW (ref 15–41)
Albumin: 3.5 g/dL (ref 3.5–5.0)
Alkaline Phosphatase: 67 U/L (ref 38–126)
Anion gap: 8 (ref 5–15)
BUN: 72 mg/dL — ABNORMAL HIGH (ref 8–23)
CO2: 18 mmol/L — ABNORMAL LOW (ref 22–32)
Calcium: 8.7 mg/dL — ABNORMAL LOW (ref 8.9–10.3)
Chloride: 105 mmol/L (ref 98–111)
Creatinine, Ser: 5.12 mg/dL — ABNORMAL HIGH (ref 0.61–1.24)
GFR, Estimated: 11 mL/min — ABNORMAL LOW (ref >60)
Glucose, Bld: 102 mg/dL — ABNORMAL HIGH (ref 70–99)
Potassium: 5 mmol/L (ref 3.5–5.1)
Sodium: 131 mmol/L — ABNORMAL LOW (ref 135–145)
Total Bilirubin: 0.7 mg/dL (ref 0.0–1.2)
Total Protein: 8.8 g/dL — ABNORMAL HIGH (ref 6.5–8.1)

## 2023-05-15 LAB — CBC WITH DIFFERENTIAL/PLATELET
Abs Immature Granulocytes: 0.04 10*3/uL (ref 0.00–0.07)
Basophils Absolute: 0.1 10*3/uL (ref 0.0–0.1)
Basophils Relative: 1 %
Eosinophils Absolute: 0.4 10*3/uL (ref 0.0–0.5)
Eosinophils Relative: 5 %
HCT: 30.6 % — ABNORMAL LOW (ref 39.0–52.0)
Hemoglobin: 9.4 g/dL — ABNORMAL LOW (ref 13.0–17.0)
Immature Granulocytes: 0 %
Lymphocytes Relative: 11 %
Lymphs Abs: 1 10*3/uL (ref 0.7–4.0)
MCH: 31.9 pg (ref 26.0–34.0)
MCHC: 30.7 g/dL (ref 30.0–36.0)
MCV: 103.7 fL — ABNORMAL HIGH (ref 80.0–100.0)
Monocytes Absolute: 0.9 10*3/uL (ref 0.1–1.0)
Monocytes Relative: 10 %
Neutro Abs: 6.7 10*3/uL (ref 1.7–7.7)
Neutrophils Relative %: 73 %
Platelets: 281 10*3/uL (ref 150–400)
RBC: 2.95 MIL/uL — ABNORMAL LOW (ref 4.22–5.81)
RDW: 17.4 % — ABNORMAL HIGH (ref 11.5–15.5)
WBC: 9 10*3/uL (ref 4.0–10.5)
nRBC: 0 % (ref 0.0–0.2)

## 2023-05-15 MED ORDER — EPOETIN ALFA-EPBX 40000 UNIT/ML IJ SOLN
40000.0000 [IU] | INTRAMUSCULAR | Status: DC
  Filled 2023-05-15: qty 1

## 2023-05-15 NOTE — Progress Notes (Signed)
 [name] told us that this patient didn't need their retacrit injection today. I went ahead and pulled out the injection after releasing it, but my nurse Herbert Seta showed me what to do in that situation, which was by putting the drug back in through the pixis.

## 2023-05-15 NOTE — Progress Notes (Signed)
 Hematology/Oncology Consult note [facility]  [phone] [phone]  Patient Care Team: [name], MD as PCP - General (Internal Medicine) Carmina Miller, MD as Radiation Oncologist (Radiation Oncology) Creig Hines, MD as Consulting Physician (Oncology)   Name of the patient: [name]  [id number]  Oct 28, 1945   Date of visit: 05/15/23  Diagnosis- 1.  Smoldering multiple myeloma 2.  History of superficial bladder cancer 3. Anemia of chronic kidney disease  Chief complaint/ Reason for visit-routine follow-up of anemia  Heme/Onc history: Patient is a 78 year old male with history of superficial bladder cancer was last seen by me in November 2022. At that time he completed 6 weekly cycles of intravesical gemcitabine chemotherapy and was subsequently followed up by urology. He has been having concerns of radiation cystitis for which she has been undergoing hyperbaric oxygen therapy. He followed up with Dr. Wynelle Link from nephrology for CKD when he was found to have a creatinine of 2.3. At that time he was found to have a creatinine of 2.9 with an H&H of 10.2/31.2. Possible M protein noted on SPEP and labs also showed elevated lambda light chain of 403 with a free light chain ratio of 0.11    Patient was hospitalized on 12/16/2022 after he was found to have acute kidney injury with a creatinine of 9.  Given the concern for multiple myeloma patient underwent PET scan as an inpatient which does not show any evidence of lytic lesions.He underwent ultrasound renal which showed bilateral hydronephrosis left greater than right which was chronic as compared to February 2024 and therefore urology did not feel that any nephrostomy tube or stent was warranted.  Patient was given IV fluids and Foley catheter was placed with significant improvement in his renal functions.   Bone marrow biopsy on10/30/2024 showed lambda restricted plasma cell  neoplasm involving 15% of the marrow.  Karyotype was normal.  Myeloma FISH panel showed gain/duplication of 1 q. overall clinical picture was thought to be anemia secondary to chronic kidney disease but the cause of chronic kidney disease was not attributed to myeloma but rather secondary to underlying bladder issues and potential obstructive uropathy.  Patient also went for second opinion to [facility] and this was agreed upon as well.  He is presently on EPO for his anemia    Interval history-patient was seen by Dr. Lonna Cobb in March 2025 and may be undergoing cystoscopy and bilateral retrograde pyelogram.  He is doing intermittent self cath twice a day.  ECOG PS- 2 Pain scale- 0   Review of systems- Review of Systems  Constitutional:  Positive for malaise/fatigue. Negative for chills, fever and weight loss.  HENT:  Negative for congestion, ear discharge and nosebleeds.   Eyes:  Negative for blurred vision.  Respiratory:  Negative for cough, hemoptysis, sputum production, shortness of breath and wheezing.   Cardiovascular:  Negative for chest pain, palpitations, orthopnea and claudication.  Gastrointestinal:  Negative for abdominal pain, blood in stool, constipation, diarrhea, heartburn, melena, nausea and vomiting.  Genitourinary:  Negative for dysuria, flank pain, frequency, hematuria and urgency.  Musculoskeletal:  Negative for back pain, joint pain and myalgias.  Skin:  Negative for rash.  Neurological:  Negative for dizziness, tingling, focal weakness, seizures, weakness and headaches.  Endo/Heme/Allergies:  Does not bruise/bleed easily.  Psychiatric/Behavioral:  Negative for depression and suicidal ideas. The patient does not have insomnia.       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 4v CABG on 04/13/2002. b.) LHC 02/03/2013 --> LVEF 50%; severe 3v CAD with patent LIMA-LAD and SVG-OM1 grafts; SVG-PDA and SVG-DG1 occluded with collaterals to PDA and DG1 from LAD.   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   a.) LIMA-LAD, SVG-OM1, SVG-D1, SVG-PDA   Tubular adenoma of colon 02/03/2007     Past Surgical History:  Procedure Laterality Date   CARDIAC CATHETERIZATION Left 04/13/2002   Procedure: CARDIAC CATHETERIZATION; Location: [facility]; Surgeon; Marcina Millard, MD   CARDIAC CATHETERIZATION Left 02/03/2013   Procedure: CARDIAC CATHETERIZATION; Location: [facility]; Surgeon: Marcina Millard, MD   COLONOSCOPY W/ POLYPECTOMY     COLONOSCOPY WITH PROPOFOL N/A 04/09/2015   Procedure: COLONOSCOPY WITH PROPOFOL;  Surgeon: Wallace Cullens, MD;  Location[facility];  Service: Gastroenterology;  Laterality: N/A;   CORONARY ANGIOPLASTY     CORONARY ARTERY BYPASS GRAFT N/A 04/13/2002   Procedure: 4v CABG (LIMA-LAD, SVG-OM1, SVG-D1, SVG-PDA); Location: [facility]; Surgeon: Rana Snare, MD   CYSTOSCOPY N/A 07/17/2021   Procedure: Bluford Kaufmann;  Surgeon: [name], MD;  Location: [facility];  Service: Urology;  Laterality: N/A;   CYSTOSCOPY N/A 06/24/2022   Procedure: DIAGNOSTIC CYSTOSCOPY;  Surgeon: [name], MD;  Location: [facility];  Service: Urology;  Laterality: N/A;    CYSTOSCOPY WITH BIOPSY N/A 07/17/2021   Procedure: CYSTOSCOPY WITH BIOPSY;  Surgeon: [name], MD;  Location: [facility];  Service: Urology;  Laterality: N/A;   CYSTOSCOPY WITH BIOPSY N/A 06/24/2022   Procedure: CYSTOSCOPY WITH BLADDER BIOPSY;  Surgeon: [name], MD;  Location: [facility];  Service: Urology;  Laterality: N/A;   CYSTOSCOPY WITH FULGERATION N/A 07/17/2021   Procedure: CYSTOSCOPY WITH FULGERATION;  Surgeon: [name], MD;  Location: [facility];  Service: Urology;  Laterality: N/A;   CYSTOSCOPY WITH FULGERATION N/A 06/24/2022   Procedure: CYSTOSCOPY WITH FULGERATION;  Surgeon: [name], MD;  Location: [facility];  Service: Urology;  Laterality: N/A;   EYE SURGERY     both eyes with cataract with lens 2021   IR BONE MARROW BIOPSY & ASPIRATION  12/17/2022   TONSILLECTOMY     TRANSURETHRAL RESECTION OF BLADDER TUMOR N/A 11/13/2020   Procedure: TRANSURETHRAL RESECTION OF BLADDER TUMOR (TURBT);  Surgeon: [name], MD;  Location: [facility];  Service: Urology;  Laterality: N/A;   TRANSURETHRAL RESECTION OF BLADDER TUMOR N/A 06/24/2022   Procedure: TRANSURETHRAL RESECTION OF BLADDER TUMOR (TURBT);  Surgeon: [name], MD;  Location: [facility];  Service: Urology;  Laterality: N/A;  TRANSURETHRAL RESECTION OF BLADDER TUMOR WITH GYRUS (TURBT-GYRUS)  06/24/2022   TRANSURETHRAL RESECTION OF PROSTATE      Social History   Socioeconomic History   Marital status: Married    Spouse name: [name]   Number of children: Not on file   Years of education: Not on file   Highest education level: Not on file  Occupational History   Not on file  Tobacco Use   Smoking status: Former    Current packs/day: 0.00    Average packs/day: 0.8 packs/day for 60.0 years (45.0 ttl pk-yrs)    Types: Cigarettes    Start date: 42    Quit date: 2008    Years since quitting: 17.2    Passive exposure: Past   Smokeless tobacco: Never  Substance and Sexual Activity   Alcohol  use: Yes    Alcohol/week: 2.0 standard drinks of alcohol    Types: 2 Standard drinks or equivalent per week    Comment: 14/week   Drug use: No   Sexual activity: Not on file  Other Topics Concern   Not on file  Social History Narrative   Not on file   Social Drivers of Health   Financial Resource Strain: Low Risk  (01/13/2023)   Received [facility]   Overall Financial Resource Strain (CARDIA)    Difficulty of Paying Living Expenses: Not hard at all  Food Insecurity: No Food Insecurity (01/13/2023)   Received from [facility]   Hunger Vital Sign    Worried About Running Out of Food in the Last Year: Never true    Ran Out of Food in the Last Year: Never true  Transportation Needs: No Transportation Needs (01/13/2023)   Received from [facility] - Transportation    Lack of Transportation (Medical): No    Lack of Transportation (Non-Medical): No  Physical Activity: Not on file  Stress: Not on file  Social Connections: Not on file  Intimate Partner Violence: Not At Risk (12/16/2022)   Humiliation, Afraid, Rape, and Kick questionnaire    Fear of Current or Ex-Partner: No    Emotionally Abused: No    Physically Abused: No    Sexually Abused: No    Family History  Problem Relation Age of Onset   Prostate cancer Father        Metastatic   Cancer Father      Current Outpatient Medications:    acetaminophen (TYLENOL) 500 MG tablet, Take 500 mg by mouth every 6 (six) hours as needed., Disp: , Rfl:    aspirin 81 MG tablet, Take 81 mg by mouth daily., Disp: , Rfl:    atorvastatin (LIPITOR) 10 MG tablet, Take 10 mg by mouth daily., Disp: , Rfl:    cefUROXime (CEFTIN) 500 MG tablet, Take 1 tablet (500 mg total) by mouth 2 (two) times daily with a meal., Disp: 14 tablet, Rfl: 0   cetirizine (ZYRTEC) 10 MG tablet, Take 10 mg by mouth at bedtime., Disp: , Rfl:    Coenzyme Q10 100 MG capsule, Take 100 mg by mouth daily. , Disp: , Rfl:    feeding supplement  (ENSURE ENLIVE / ENSURE PLUS) LIQD, Take 237 mLs by mouth 3 (three) times daily between meals., Disp: , Rfl:    finasteride (PROSCAR) 5 MG tablet, Take 1 tablet (5 mg total) by mouth daily., Disp: 90 tablet, Rfl: 3   fluticasone (FLONASE) 50 MCG/ACT nasal spray, Place 1 spray into both nostrils as needed., Disp: ,  Rfl:    GLUCOSAMINE-CHONDROITIN PO, Take 1 tablet by mouth 2 (two) times daily., Disp: , Rfl:    Magnesium 200 MG TABS, Take 200 mg by mouth 2 (two) times daily., Disp: , Rfl:    metroNIDAZOLE (METROGEL) 0.75 % gel, Apply 1 application topically 2 (two) times daily., Disp: , Rfl:    Multiple Vitamin (MULTIVITAMIN) tablet, Take 1 tablet by mouth daily., Disp: , Rfl:    nitrofurantoin, macrocrystal-monohydrate, (MACROBID) 100 MG capsule, Take 1 capsule (100 mg total) by mouth every 12 (twelve) hours., Disp: 14 capsule, Rfl: 0   nitroGLYCERIN (NITROSTAT) 0.4 MG SL tablet, Place under the tongue., Disp: , Rfl:    Omega-3 Fatty Acids (FISH OIL) 1000 MG CAPS, Take 1,000 mg by mouth daily., Disp: , Rfl:    Probiotic Product (MEGA PROBIOTIC) CAPS, Take 1 capsule by mouth daily., Disp: , Rfl:    senna-docusate (SENOKOT-S) 8.6-50 MG tablet, Take 1 tablet by mouth 2 (two) times daily as needed for mild constipation., Disp: 30 tablet, Rfl: 0   silodosin (RAPAFLO) 8 MG CAPS capsule, TAKE 1 CAPSULE BY MOUTH DAILY WITH BREAKFAST, Disp: 30 capsule, Rfl: 11   simethicone (MYLICON) 80 MG chewable tablet, Chew 1 tablet (80 mg total) by mouth 4 (four) times daily as needed for flatulence., Disp: 30 tablet, Rfl: 0   Vitamin A 2400 MCG (8000 UT) CAPS, Take 8,000 Units by mouth daily., Disp: , Rfl:    vitamin C (ASCORBIC ACID) 250 MG tablet, Take 250 mg by mouth 2 (two) times daily., Disp: , Rfl:    zinc gluconate 50 MG tablet, Take 50 mg by mouth daily., Disp: , Rfl:  No current facility-administered medications for this visit.  Facility-Administered Medications Ordered in Other Visits:    epoetin alfa-epbx  (RETACRIT) injection 40,000 Units, 40,000 Units, Subcutaneous, Q21 days, Creig Hines, MD  Physical exam:  Vitals:   05/15/23 1132  BP: 121/64  Pulse: 77  Resp: 19  Temp: (!) 96.6 F (35.9 C)  TempSrc: Tympanic  SpO2: 97%  Weight: 143 lb 8 oz (65.1 kg)  Height: 5\' 9"  (1.753 m)   Physical Exam Cardiovascular:     Rate and Rhythm: Normal rate and regular rhythm.     Heart sounds: Normal heart sounds.  Pulmonary:     Effort: Pulmonary effort is normal.     Breath sounds: Normal breath sounds.  Musculoskeletal:     Right lower leg: No edema.     Left lower leg: No edema.  Skin:    General: Skin is warm and dry.  Neurological:     Mental Status: He is alert and oriented to person, place, and time.         Latest Ref Rng & Units 05/15/2023   11:20 AM  CMP  Glucose 70 - 99 mg/dL 161   BUN 8 - 23 mg/dL 72   Creatinine 0.96 - 1.24 mg/dL 0.45   Sodium 409 - 811 mmol/L 131   Potassium 3.5 - 5.1 mmol/L 5.0   Chloride 98 - 111 mmol/L 105   CO2 22 - 32 mmol/L 18   Calcium 8.9 - 10.3 mg/dL 8.7   Total Protein 6.5 - 8.1 g/dL 8.8   Total Bilirubin 0.0 - 1.2 mg/dL 0.7   Alkaline Phos 38 - 126 U/L 67   AST 15 - 41 U/L 14   ALT 0 - 44 U/L 11       Latest Ref Rng & Units 05/15/2023   11:20 AM  CBC  WBC 4.0 - 10.5 K/uL 9.0   Hemoglobin 13.0 - 17.0 g/dL 9.4   Hematocrit 40.9 - 52.0 % 30.6   Platelets 150 - 400 K/uL 281     No images are attached to the encounter.  Ultrasound renal complete Result Date: 05/07/2023 CLINICAL DATA:  Hydronephrosis EXAM: RENAL / URINARY TRACT ULTRASOUND COMPLETE COMPARISON:  Ultrasound July 24, 2023, CT abdomen July 28, 2019 FINDINGS: Right Kidney: Renal measurements: 11 x 5 by 5 cm. = volume: mL. Grade 2 hydronephrosis. Cortical cyst in the midpole 3.1 x 2 x 2.6 cm and a cortical cyst in the lower pole of 2.5 x 2.6 x 1.8 cm. Left Kidney: Renal measurements: 11.2 x 5.6 x 6.9 cm = volume: 222 mL. Moderate grade 2 hydronephrosis. Simple cortical cyst  in the upper pole 5.4 x 3.9 x 2.5 cm simple cyst in the lower pole 1.9 x 1.9 cm Bladder: Some debris within the bladder with mild bladder wall thickening. Could correlate chronic cystitis possible chronic inflammatory infectious process. Other: None. IMPRESSION: *Bilateral grade 2 hydronephrosis. Worsening hydronephrosis compared with prior examination *Bilateral simple renal cysts. *Debris within the bladder with mild bladder wall thickening. Could correlate chronic cystitis possible chronic inflammatory infectious process. Electronically Signed   By: Shaaron Adler M.D.   On: 05/07/2023 22:03     Assessment and plan- Patient is a 78 y.o. male who is here for follow-up of following issues:  Anemia of chronic kidney disease: Continue Retacrit along with H&H every 3 weeks.  He would be due for next dose on 05/26/2023.  Goal is to maintain his hemoglobin between 10-11.  Recent iron studies showed ferritin more than 100 and iron saturation greater than 20%.  He does not require any IV iron at this time.  Cause of his anemia has been attributed to his chronic kidney disease  Smoldering multiple myeloma: Myeloma labs from today are pending.  Bone marrow biopsy in the recent past showed 15% clonal plasma cells.Although his free light chain ratio is abnormal at 0.09 we are not calling this multiple myeloma yet especially given that his PET scan was negative.  He also had a second opinion with [facility] regarding this.  His renal functions continue to worsen and concern is obstructive uropathy given the worsening bilateral hydronephrosis.  He continues to follow-up with both nephrology and urology and may be undergoing cystoscopy with bilateral retrograde pyelogram in the near future.  I will see him back in 6 months.  CBC ferritin and iron studies myeloma panel and serum free light chains to be checked in 3 and 6 months.  If there is no overt cause of obstructive uropathy found on bilateral pyelogram he may need a kidney  biopsy but this cannot be done in [place] due to his hydronephrosis and the risk that it carries and may need to get this done at [facility].  He has an appointment with [facility] hematology again in June 2025   Visit Diagnosis 1. Erythropoietin (EPO) stimulating agent anemia management patient   2. Smoldering multiple myeloma   3. Anemia in stage 4 chronic kidney disease (HCC)      Dr. Owens Shark, MD, MPH [facility] at [facility] [phone] 05/15/2023 3:16 PM

## 2023-05-18 ENCOUNTER — Telehealth: Payer: Self-pay

## 2023-05-18 ENCOUNTER — Other Ambulatory Visit

## 2023-05-18 ENCOUNTER — Encounter: Payer: Self-pay | Admitting: Urology

## 2023-05-18 ENCOUNTER — Other Ambulatory Visit: Payer: Self-pay

## 2023-05-18 DIAGNOSIS — N1330 Unspecified hydronephrosis: Secondary | ICD-10-CM

## 2023-05-18 DIAGNOSIS — Z8551 Personal history of malignant neoplasm of bladder: Secondary | ICD-10-CM

## 2023-05-18 LAB — URINALYSIS, COMPLETE
Bilirubin, UA: NEGATIVE
Glucose, UA: NEGATIVE
Ketones, UA: NEGATIVE
Nitrite, UA: POSITIVE — AB
Specific Gravity, UA: 1.02 (ref 1.005–1.030)
Urobilinogen, Ur: 0.2 mg/dL (ref 0.2–1.0)
pH, UA: 6 (ref 5.0–7.5)

## 2023-05-18 LAB — KAPPA/LAMBDA LIGHT CHAINS
Kappa free light chain: 45 mg/L — ABNORMAL HIGH (ref 3.3–19.4)
Kappa, lambda light chain ratio: 0.07 — ABNORMAL LOW (ref 0.26–1.65)
Lambda free light chains: 650.4 mg/L — ABNORMAL HIGH (ref 5.7–26.3)

## 2023-05-18 LAB — MICROSCOPIC EXAMINATION: WBC, UA: >30 /HPF — AB (ref 0–5)

## 2023-05-18 LAB — MULTIPLE MYELOMA PANEL, SERUM
Albumin SerPl Elph-Mcnc: 3.8 g/dL (ref 2.9–4.4)
Albumin/Glob SerPl: 1 (ref 0.7–1.7)
Alpha 1: 0.3 g/dL (ref 0.0–0.4)
Alpha2 Glob SerPl Elph-Mcnc: 0.8 g/dL (ref 0.4–1.0)
B-Globulin SerPl Elph-Mcnc: 0.7 g/dL (ref 0.7–1.3)
Gamma Glob SerPl Elph-Mcnc: 2.3 g/dL — ABNORMAL HIGH (ref 0.4–1.8)
Globulin, Total: 4.2 g/dL — ABNORMAL HIGH (ref 2.2–3.9)
IgA: 63 mg/dL (ref 61–437)
IgG (Immunoglobin G), Serum: 2959 mg/dL — ABNORMAL HIGH (ref 603–1613)
IgM (Immunoglobulin M), Srm: 55 mg/dL (ref 15–143)
M Protein SerPl Elph-Mcnc: 1.8 g/dL — ABNORMAL HIGH
Total Protein ELP: 8 g/dL (ref 6.0–8.5)

## 2023-05-18 NOTE — Progress Notes (Signed)
   [facility] Surgical Posting Form  Surgery Date: Date: 05/26/2023  Surgeon: Dr. Irineo Axon, MD  Inpt ( No  )   Outpt (Yes)   Obs ( No  )   Diagnosis: N13.30 Bilateral Hydronephrosis, Z85.51 History of Bladder Cancer  -CPT: 74420, 16109, 60454, 09811  Surgery: Cystoscopy with Bilateral Retrograde Pyelograms, Possible Bilateral Ureteral Stent Placement, Possible Bladder Biopsy and Possible Bladder Fulguration  Stop Anticoagulations: Yes, will need to hold ASA  Cardiac/Medical/Pulmonary Clearance needed: Yes  Clearance needed from Dr: Darrold Junker  Clearance request sent on: Date: 05/18/23  *Orders entered into EPIC  Date: 05/18/23   *Case booked in EPIC  Date: 05/18/23  *Notified pt of Surgery: Date: 05/18/23  PRE-OP UA & CX: yes, will obtain in clinic today 05/18/2023  *Placed into Prior Authorization Work Angela Nevin Date: 05/18/23  Assistant/laser/rep:No

## 2023-05-18 NOTE — Telephone Encounter (Signed)
 Per Dr. Lonna Cobb, Patient is to be scheduled for Cystoscopy with Bilateral Retrograde Pyelograms, Possible Bilateral Ureteral Stent Placement, Possible Bladder Biopsy and Possible Bladder Fulguration   [name] was contacted and possible surgical dates were discussed, Tuesday April 8th, 2025 was agreed upon for surgery.   Patient was instructed that Dr. Lonna Cobb will require them to provide a pre-op UA & CX prior to surgery. This was ordered and scheduled drop off appointment was made for 05/18/2023.    Patient was directed to call [phone] between 1-3pm the day before surgery to find out surgical arrival time.  Instructions were given not to eat or drink from midnight on the night before surgery and have a driver for the day of surgery. On the surgery day patient was instructed to enter through the Medical Mall entrance of [facility] report the Same Day Surgery desk.   Pre-Admit Testing will be in contact via phone to set up an interview with the anesthesia team to review your history and medications prior to surgery.   Reminder of this information was sent via MyChart to the patient.

## 2023-05-18 NOTE — Progress Notes (Signed)
  Phone Number: [phone] for Surgical Coordinator Fax Number: [phone]  REQUEST FOR SURGICAL CLEARANCE       Date: Date: 05/18/23  Faxed to: Dr. Darrold Junker, MD  Surgeon: Dr. Irineo Axon, MD     Date of Surgery: 05/26/2023  Operation: Cystoscopy with Bilateral Retrograde Pyelograms, Possible Bilateral Ureteral Stent Placement, Possible Bladder Biopsy and Possible Bladder Fulguration    Anesthesia Type: Choice   Diagnosis: Bilateral Hydronephrosis, History of Bladder Cancer  Patient Requires:   Cardiac / Vascular Clearance : Yes  Reason: Would like for patient to hold 81mg  ASA prior to surgery  Risk Assessment:    Low   []       Moderate   []     High   []           This patient is optimized for surgery  YES []       NO   []    I recommend further assessment/workup prior to surgery. YES []      NO  []   Appointment scheduled for: _______________________   Further recommendations: ____________________________________     Physician Signature:__________________________________   Printed Name: ________________________________________   Date: _________________

## 2023-05-18 NOTE — Progress Notes (Signed)
 Surgical Physician Order Form [facility]  Dr. Irineo Axon, MD  * Scheduling expectation : ASAP  *Length of Case: 60 minutes  *Clearance needed: no  *Anticoagulation Instructions: N/A  *Aspirin Instructions: Hold Aspirin  *Post-op visit Date/Instructions: TBD  *Diagnosis: Bilateral hydronephrosis; history bladder cancer  *Procedure: Cystoscopy with bilateral retrograde pyelogram; possible bilateral ureteral stent placement; possible bladder biopsy/fulguration   Additional orders: N/A  -Admit type: OUTpatient  -Anesthesia: Choice  -VTE Prophylaxis Standing Order SCD's       Other:   -Standing Lab Orders Per Anesthesia    Lab other: UA&Urine Culture  -Standing Test orders EKG/Chest x-ray per Anesthesia       Test other:   - Medications:  Ceftriaxone(Rocephin) 1gm IV  -Other orders:  N/A

## 2023-05-20 ENCOUNTER — Inpatient Hospital Stay: Admission: RE | Admit: 2023-05-20 | Source: Ambulatory Visit

## 2023-05-21 ENCOUNTER — Encounter: Payer: Self-pay | Admitting: Urology

## 2023-05-21 ENCOUNTER — Other Ambulatory Visit: Payer: Self-pay

## 2023-05-21 ENCOUNTER — Encounter
Admission: RE | Admit: 2023-05-21 | Discharge: 2023-05-21 | Disposition: A | Discharge status: Home / Self Care | Source: Ambulatory Visit | Attending: Urology | Admitting: Urology

## 2023-05-21 HISTORY — DX: Long term (current) use of aspirin: Z79.82

## 2023-05-21 HISTORY — DX: Pure hypercholesterolemia, unspecified: E78.00

## 2023-05-21 HISTORY — DX: Anemia in chronic kidney disease: D63.1

## 2023-05-21 HISTORY — DX: Unspecified right bundle-branch block: I45.10

## 2023-05-21 HISTORY — DX: Chronic kidney disease, stage 4 (severe): N18.4

## 2023-05-21 HISTORY — DX: Essential (primary) hypertension: I10

## 2023-05-21 HISTORY — DX: Benign prostatic hyperplasia without lower urinary tract symptoms: N40.0

## 2023-05-21 HISTORY — DX: Atherosclerotic heart disease of native coronary artery without angina pectoris: I25.10

## 2023-05-21 HISTORY — DX: Monoclonal gammopathy: D47.2

## 2023-05-21 LAB — CULTURE, URINE COMPREHENSIVE

## 2023-05-21 NOTE — Patient Instructions (Addendum)
 Your procedure is scheduled on: Tuesday, April 8 Report to the Registration Desk on the 1st floor of the [facility] To find out your arrival time, please call [phone] between 1PM - 3PM on: Monday, April 7 If your arrival time is 6:00 am, do not arrive before that time as the Medical Mall entrance doors do not open until 6:00 am.  REMEMBER: Instructions that are not followed completely may result in serious medical risk, up to and including death; or upon the discretion of your surgeon and anesthesiologist your surgery may need to be rescheduled.  Do not eat or drink after midnight the night before surgery.  No gum chewing or hard candies.  One week prior to surgery:  Stop Anti-inflammatories (NSAIDS) such as Advil, Aleve, Ibuprofen, Motrin, Naproxen, Naprosyn and Aspirin based products such as Excedrin, Goody's Powder, BC Powder. Stop ANY OVER THE COUNTER supplements until after surgery.   You may however, continue to take Tylenol if needed for pain up until the day of surgery.  Aspirin - hold for 5 days before surgery. Last day to take is Wednesday, April 2. Resume AFTER surgery per surgeon instruction.  Continue taking all of your other prescription medications up until the day of surgery.  ON THE DAY OF SURGERY ONLY TAKE THESE MEDICATIONS WITH SIPS OF WATER:  silodosin (RAPAFLO)   No Alcohol for 24 hours before or after surgery.  No Smoking including e-cigarettes for 24 hours before surgery.  No chewable tobacco products for at least 6 hours before surgery.  No nicotine patches on the day of surgery.  Do not use any "recreational" drugs for at least a week (preferably 2 weeks) before your surgery.  Please be advised that the combination of cocaine and anesthesia may have negative outcomes, up to and including death. If you test positive for cocaine, your surgery will be cancelled.  On the morning of surgery brush your teeth with toothpaste and water, you may rinse your  mouth with mouthwash if you wish. Do not swallow any toothpaste or mouthwash.  Do not wear jewelry, make-up, hairpins, clips or nail polish.  For welded (permanent) jewelry: bracelets, anklets, waist bands, etc.  Please have this removed prior to surgery.  If it is not removed, there is a chance that hospital personnel will need to cut it off on the day of surgery.  Do not wear lotions, powders, or perfumes.   Do not shave body hair from the neck down 48 hours before surgery.  Contact lenses, hearing aids and dentures may not be worn into surgery.  Do not bring valuables to the hospital. [facility] is not responsible for any missing/lost belongings or valuables.   Notify your doctor if there is any change in your medical condition (cold, fever, infection).  Wear comfortable clothing (specific to your surgery type) to the hospital.  After surgery, you can help prevent lung complications by doing breathing exercises.  Take deep breaths and cough every 1-2 hours.   If you are being discharged the day of surgery, you will not be allowed to drive home. You will need a responsible individual to drive you home and stay with you for 24 hours after surgery.   If you are taking public transportation, you will need to have a responsible individual with you.  Please call the Pre-admissions Testing Dept. at [phone] if you have any questions about these instructions.  Surgery Visitation Policy:  Patients having surgery or a procedure may have two visitors.  Children under the age of 73 must have an adult with them who is not the patient.

## 2023-05-22 ENCOUNTER — Other Ambulatory Visit: Payer: Self-pay

## 2023-05-22 ENCOUNTER — Encounter: Payer: Self-pay | Admitting: Urology

## 2023-05-22 DIAGNOSIS — D631 Anemia in chronic kidney disease: Secondary | ICD-10-CM

## 2023-05-22 NOTE — Progress Notes (Signed)
 Perioperative / Anesthesia Services  Pre-Admission Testing Clinical Review / Preoperative Anesthesia Consult  Date: 05/22/23  Patient Demographics:  Name: [name] DOB:   [birth year] MRN:   [id number]  Planned Surgical Procedure(s):    Case: [id number] Date/Time: 05/26/23 1230   Procedure: CYSTOSCOPY, WITH RETROGRADE PYELOGRAM (Bilateral) - POSSIBLE STENT, POSSIBLE BIOPSY, POSSIBLE REMOVAL OF LESIONS   Anesthesia type: Choice   Diagnosis:      Bilateral hydronephrosis [N13.30]     History of bladder cancer [Z85.51]   Pre-op diagnosis:      Bilateral Hydronephrosis     History of Bladder Cancer   Location: [facility] OR ROOM 10 / [facility]   Surgeons: [name], MD     NOTE: Available PAT nursing documentation and vital signs have been reviewed. Clinical nursing staff has updated patient's PMH/PSHx, current medication list, and drug allergies/intolerances to ensure comprehensive history available to assist in medical decision making as it pertains to the aforementioned surgical procedure and anticipated anesthetic course. Extensive review of available clinical information personally performed. [place] [facility] and PSHx updated with any diagnoses/procedures that  may have been inadvertently omitted during his intake with the pre-admission testing department's nursing staff.  Clinical Discussion:  [name] is a 78 y.o. male who is submitted for pre-surgical anesthesia review and clearance prior to him undergoing the above procedure. Patient is a Former Smoker (45 pack years; quit 02/2006). Pertinent PMH includes: CAD (s/p CABG), infrarenal AAA, BILATERAL common iliac artery aneurysms, bifascicular block (RBBB + LAFB), angina, aortic atherosclerosis, HTN, HLD, CKD-IV, smoldering multiple myeloma, bladder tumor, ED, anemia, prostate cancer.   Patient is followed by cardiology Darrold Junker, MD). He was last seen in the cardiology clinic on 03/12/2023; notes  reviewed. At the time of his clinic visit, patient doing well overall from a cardiovascular perspective. Patient reporting intermittent episodes of increased peripheral edema. Of note, diuretic (triamterene-HCTZ) previously discontinued due to micturitional frequency. Patient denied any chest pain, shortness of breath, PND, orthopnea, palpitations,  weakness, fatigue, vertiginous symptoms, or presyncope/syncope. Patient with a past medical history significant for cardiovascular diagnoses. Documented physical exam was grossly benign, providing no evidence of acute exacerbation and/or decompensation of the patient's known cardiovascular conditions.  Patient underwent diagnostic left heart catheterization on 04/13/2002 revealed an LVEF of 42%.  Study demonstrated multivessel CAD; 100% proximal RCA, 50% distal RCA, 80% LM, 50% proximal LCx, 80% OM1, 90% mid LAD, 50% D1, and 90% D2.  Given degree and complexity of patient's disease, recommendations were for consultation with CVTS for CABG procedure.   Patient underwent a four-vessel CABG on 04/13/2002.  LIMA-LAD, SVG-OM1, SVG-D1, and SVG-PDA bypass grafts were placed.   Repeat diagnostic left heart catheterization was performed on 02/03/2013 revealing a normal LVEF of 50%.  Multivessel CAD once again demonstrated; 80% stenosis mid LM, 80% stenosis of the proximal LAD, 80% stenosis of OM1, and 100% stenosis proximal RCA.  Patient with patent LIMA-LAD and SVG-OM1 grafts.  SVG-PDA and SVG-DG 1 grafts were occluded with collaterals to PDA and DG1 from the LAD.   Myocardial perfusion imaging study performed on 09/22/2018 revealed an LVEF of 45%.  There was inferior wall hypokinesis noted.  Left ventricular cavity size was enlarged.  There was evidence of a moderate inferior scar without evidence for ischemia.  Blood pressure well controlled at 118/60 mmHg without the need for pharmacological intervention. He is on an alpha-blocker (sildosin) for his prostate, which  likely has some degree of effect on  his blood pressures. Patient is on atorvastatin + omega-3 fatty acid for his HLD diagnosis and ASCVD prevention.  Patient has a supply of short acting nitrates (NTG) to use on a as needed basis for recurrent angina/anginal equivalent symptoms; denied recent use.  Patient is not diabetic.  He does not have an OSAH diagnosis. Patient is able to complete all of his  ADL/IADLs without cardiovascular limitation.  Per the [facility]AS[facility], patient is able to achieve at least 4 METS of physical activity without experiencing any significant degree of angina/anginal equivalent symptoms. Patient scheduled to follow-up with outpatient cardiology in 4 months or sooner if needed.  [name] is scheduled for an CYSTOSCOPY, WITH RETROGRADE PYELOGRAM (Bilateral) on 05/26/2023 with Dr. Irineo Axon, MD.  Given patient's past medical history significant for cardiovascular diagnoses, presurgical cardiac clearance was sought by the PAT team. Per cardiology, "this patient is optimized for surgery and may proceed with the planned procedural course with a LOW risk of significant perioperative cardiovascular complications".  In review of his medication reconciliation, it is noted that patient is currently on prescribed daily antithrombotic therapy. He has been instructed on recommendations for holding his daily low dose ASA for 5 days prior to his procedure with plans to restart as soon as postoperative bleeding risk felt to be minimized by his attending surgeon. The patient has been instructed that his last dose of his ASA should be on 05/20/2023.  Patient denies previous perioperative complications with anesthesia in the past. In review of the available records, it is noted that patient underwent a general anesthetic course here at [facility] ([facility] in 06/2022 without documented complications.      05/21/2023    3:46 PM 05/15/2023   11:32 AM 05/05/2023   10:42  AM  Vitals with BMI  Height 5\' 6"  5\' 9"    Weight 142 lbs 143 lbs 8 oz   BMI 22.93 21.18   Systolic  121 126  Diastolic  64 63  Pulse  77     Providers/Specialists:   NOTE: Primary physician provider listed below. Patient may have been seen by APP or partner within same practice.   PROVIDER ROLE / SPECIALTY LAST Winfield Cunas, MD Urology (Surgeon) 04/22/2023  [name], MD Primary Care Provider 04/15/2023  Marcina Millard, MD Cardiology 03/12/2023  Lamont Dowdy, MD Nephrology 05/20/2023  Owens Shark, MD Medical Oncology 05/15/2023   Allergies:  Cidex [glutaral] and Other  Current Home Medications:   No current facility-administered medications for this encounter.    acetaminophen (TYLENOL) 500 MG tablet   atorvastatin (LIPITOR) 10 MG tablet   cetirizine (ZYRTEC) 10 MG tablet   Coenzyme Q10 100 MG capsule   feeding supplement (ENSURE ENLIVE / ENSURE PLUS) LIQD   fluticasone (FLONASE) 50 MCG/ACT nasal spray   GLUCOSAMINE-CHONDROITIN PO   Magnesium 200 MG TABS   metroNIDAZOLE (METROGEL) 0.75 % gel   Multiple Vitamin (MULTIVITAMIN) tablet   nitroGLYCERIN (NITROSTAT) 0.4 MG SL tablet   Omega-3 Fatty Acids (FISH OIL) 1000 MG CAPS   Probiotic Product (MEGA PROBIOTIC) CAPS   silodosin (RAPAFLO) 8 MG CAPS capsule   Vitamin A 2400 MCG (8000 UT) CAPS   vitamin C (ASCORBIC ACID) 250 MG tablet   zinc gluconate 50 MG tablet   aspirin 81 MG tablet   finasteride (PROSCAR) 5 MG tablet   History:   Past Medical History:  Diagnosis Date   Anemia    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 4v CABG on 04/13/2002. b.) LHC 02/03/2013 --> LVEF 50%; severe 3v CAD with patent LIMA-LAD and SVG-OM1 grafts; SVG-PDA and SVG-DG1 occluded with collaterals to PDA and DG1 from LAD.   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   a.) LIMA-LAD, SVG-OM1, SVG-D1, SVG-PDA   Smoldering multiple myeloma    Tubular adenoma of colon 02/03/2007   Past Surgical History:  Procedure Laterality Date   CARDIAC CATHETERIZATION Left 04/13/2002   Procedure: CARDIAC CATHETERIZATION; Location: [facility]; Surgeon; Marcina Millard, MD   CARDIAC CATHETERIZATION Left 02/03/2013   Procedure: CARDIAC CATHETERIZATION; Location: [facility]; Surgeon: Marcina Millard, MD   CATARACT EXTRACTION W/ INTRAOCULAR LENS IMPLANT Bilateral 2021   COLONOSCOPY W/ POLYPECTOMY     COLONOSCOPY WITH PROPOFOL N/A 04/09/2015   Procedure: COLONOSCOPY WITH PROPOFOL;  Surgeon: Wallace Cullens, MD;  Location: [facility];  Service: Gastroenterology;  Laterality: N/A;   CORONARY ANGIOPLASTY     CORONARY ARTERY BYPASS GRAFT N/A 04/13/2002   Procedure: 4v CABG (LIMA-LAD, SVG-OM1, SVG-D1, SVG-PDA); Location: [facility]; Surgeon: Rana Snare, MD   CYSTOSCOPY N/A 07/17/2021   Procedure: Bluford Kaufmann;  Surgeon: [name], MD;  Location: [facility] ORS;  Service: Urology;  Laterality: N/A;   CYSTOSCOPY N/A 06/24/2022   Procedure: DIAGNOSTIC CYSTOSCOPY;  Surgeon: [name], MD;  Location: [facility] ORS;  Service: Urology;  Laterality: N/A;   CYSTOSCOPY WITH  BIOPSY N/A 07/17/2021   Procedure: CYSTOSCOPY WITH BIOPSY;  Surgeon: [name], MD;  Location: [facility] ORS;  Service: Urology;  Laterality: N/A;   CYSTOSCOPY WITH BIOPSY N/A 06/24/2022   Procedure: CYSTOSCOPY WITH BLADDER BIOPSY;  Surgeon: [name], MD;  Location: [facility] ORS;  Service: Urology;  Laterality: N/A;   CYSTOSCOPY WITH FULGERATION N/A 07/17/2021   Procedure: CYSTOSCOPY WITH FULGERATION;  Surgeon: [name], MD;  Location: [facility] ORS;  Service: Urology;  Laterality: N/A;   CYSTOSCOPY WITH FULGERATION N/A 06/24/2022   Procedure: CYSTOSCOPY WITH FULGERATION;  Surgeon: [name], MD;  Location: [facility] ORS;  Service: Urology;  Laterality: N/A;   IR BONE MARROW BIOPSY & ASPIRATION  12/17/2022   TONSILLECTOMY     TRANSURETHRAL RESECTION OF BLADDER TUMOR N/A 11/13/2020   Procedure: TRANSURETHRAL RESECTION OF BLADDER TUMOR (TURBT);  Surgeon: [name], MD;  Location: [facility] ORS;  Service: Urology;  Laterality: N/A;   TRANSURETHRAL RESECTION OF BLADDER TUMOR N/A 06/24/2022   Procedure: TRANSURETHRAL RESECTION OF BLADDER TUMOR (TURBT);  Surgeon:  Stoioff, Verna Czech, MD;  Location: [facility] ORS;  Service: Urology;  Laterality: N/A;   TRANSURETHRAL RESECTION OF BLADDER TUMOR WITH GYRUS (TURBT-GYRUS)  06/24/2022   TRANSURETHRAL RESECTION OF PROSTATE     Family History  Problem Relation Age of Onset   Prostate cancer Father        Metastatic   Cancer Father    Social History   Tobacco Use   Smoking status: Former    Current packs/day: 0.00    Average packs/day: 0.8 packs/day for 60.0 years (45.0 ttl pk-yrs)    Types: Cigarettes    Start date: 37    Quit date: 2008    Years since quitting: 17.2    Passive exposure: Past   Smokeless tobacco: Never  Substance Use Topics   Alcohol use: Yes    Alcohol/week: 2.0 standard drinks of alcohol    Types: 2 Standard drinks or equivalent per week    Comment: 14/week   Drug use: No    Pertinent Clinical Results:  LABS:    Lab Results  Component Value Date   WBC 9.0 05/15/2023   HGB 9.4 (L) 05/15/2023   HCT 30.6 (L) 05/15/2023   MCV 103.7 (H) 05/15/2023   PLT 281 05/15/2023   Lab Results  Component Value Date   NA 131 (L) 05/15/2023   K 5.0 05/15/2023   CO2 18 (L) 05/15/2023   GLUCOSE 102 (H) 05/15/2023   BUN 72 (H) 05/15/2023   CREATININE 5.12 (H) 05/15/2023   CALCIUM 8.7 (L) 05/15/2023   EGFR 17 (L) 04/16/2023   GFRNONAA 11 (L) 05/15/2023     ECG: Date: 12/16/2022 Time ECG obtained: 1457 PM Rate: 56 bpm Axis (leads I and aVF): Normal Intervals:  QRS 146 ms. QTc 407 ms. ST segment and T wave changes: No evidence of acute ST segment elevation or depression.  Evidence of an age undetermined inferior infarct present. Comparison: Similar to previous tracing obtained on 07/25/2022   IMAGING / PROCEDURES: ULTRASOUND RENAL COMPLETE performed on 04/27/2023 Bilateral grade 2 hydronephrosis. Worsening hydronephrosis compared with prior examination Bilateral simple renal cysts. Debris within the bladder with mild bladder wall thickening. Could correlate chronic cystitis possible chronic inflammatory infectious process.  CT ANGIO ABDOMEN PELVIS  W &/OR WO CONTRAST performed on 07/28/2019 3 cm infrarenal abdominal aortic aneurysm. Recommend followup by [place] in 3 years. This recommendation follows ACR consensus guidelines: White Paper of the ACR Incidental Findings Committee II on Vascular Findings. J Am Coll Radiol 2013; 40:981-191 2.1 cm right and 1.6 cm left common iliac artery aneurysms. Left SFA origin stenosis of possible hemodynamic significance. Correlate with clinical symptomatology and ABIs. Cholelithiasis. Aortic atherosclerosis   MYOCARDIAL PERFUSION IMAGING STUDY (LEXISCAN) performed on 09/22/2018 LVEF 45% Inferior wall hypokinesis Left ventricular cavity size enlarged No artifacts noted Moderate inferior scar without evidence of ischemia   LEFT HEART CATHETERIZATION AND CORONARY  ANGIOGRAPHY performed on 02/03/2013 LVEF 50% Right dominant coronary circulation CAD 80% stenosis of the mid LM 80% stenosis of the proximal LAD 80% stenosis OM1 100% stenosis proximal RCA Distal RCA supplied by moderate collaterals from the distal LAD and proximal RCA Previously placed bypass grafts LIMA-LAD patent SVG-OM1 patent SVG-PDA occluded SVG-DG1 occluded Collaterals from PDA and DG1 from LAD   CORONARY ARTERY BYPASS GRAFTING performed on  04/13/2002 Four-vessel revascularization (CABG) procedure LIMA-LAD SVG-OM1 SVG-D1 SVG-PDA   LEFT HEART CATHETERIZATION AND CORONARY ANGIOGRAPHY performed on 04/13/2002 LVEF 42% Multivessel CAD 100% stenosis proximal RCA 50% stenosis x 2 distal RCA 80%  stenosis LM 50% stenosis proximal LCx 80% and 50% stenoses OM1 90 and 50% stenosis mid LAD 50% stenosis D1 100% stenosis D2 Recommendations: consult CVTS for consideration of revascularization (CABG) procedure  Impression and Plan:  LINDA BIEHN has been referred for pre-anesthesia review and clearance prior to him undergoing the planned anesthetic and procedural courses. Available labs, pertinent testing, and imaging results were personally reviewed by me in preparation for upcoming operative/procedural course. [facility] medical record has been updated following extensive record review and patient interview with PAT staff.   This patient has been appropriately cleared by cardiology with an overall LOW risk of significant perioperative cardiovascular complications. Based on clinical review performed today (05/22/23), barring any significant acute changes in the patient's overall condition, it is anticipated that he will be able to proceed with the planned surgical intervention. Any acute changes in clinical condition may necessitate his procedure being postponed and/or cancelled. Patient will meet with anesthesia team (MD and/or CRNA) on the day of his procedure for preoperative  evaluation/assessment. Questions regarding anesthetic course will be fielded at that time.   Pre-surgical instructions were reviewed with the patient during his PAT appointment, and questions were fielded to satisfaction by PAT clinical staff. He has been instructed on which medications that he will need to hold prior to surgery, as well as the ones that have been deemed safe/appropriate to take on the day of his procedure. As part of the general education provided by PAT, patient made aware both verbally and in writing, that he would need to abstain from the use of any illegal substances during his perioperative course.  He was advised that failure to follow the provided instructions could necessitate case cancellation or result in serious perioperative complications up to and including death. Patient encouraged to contact PAT and/or his surgeon's office to discuss any questions or concerns that may arise prior to surgery; verbalized understanding.   Quentin Mulling, MSN, APRN, FNP-C, CEN [facility]  Peri-operative Services Nurse Practitioner Phone: [phone] Fax: [phone] 05/22/23 10:43 AM  NOTE: This note has been prepared using Dragon dictation software. Despite my best ability to proofread, there is always the potential that unintentional transcriptional errors may still occur from this process.

## 2023-05-25 ENCOUNTER — Telehealth: Payer: Self-pay

## 2023-05-25 ENCOUNTER — Inpatient Hospital Stay

## 2023-05-25 ENCOUNTER — Inpatient Hospital Stay: Attending: Radiation Oncology

## 2023-05-25 VITALS — BP 118/62

## 2023-05-25 DIAGNOSIS — Z8546 Personal history of malignant neoplasm of prostate: Secondary | ICD-10-CM | POA: Diagnosis not present

## 2023-05-25 DIAGNOSIS — D631 Anemia in chronic kidney disease: Secondary | ICD-10-CM | POA: Diagnosis present

## 2023-05-25 DIAGNOSIS — N184 Chronic kidney disease, stage 4 (severe): Secondary | ICD-10-CM | POA: Diagnosis present

## 2023-05-25 DIAGNOSIS — Z87891 Personal history of nicotine dependence: Secondary | ICD-10-CM | POA: Insufficient documentation

## 2023-05-25 DIAGNOSIS — R5383 Other fatigue: Secondary | ICD-10-CM | POA: Insufficient documentation

## 2023-05-25 DIAGNOSIS — Z8042 Family history of malignant neoplasm of prostate: Secondary | ICD-10-CM | POA: Diagnosis not present

## 2023-05-25 LAB — HEMOGLOBIN AND HEMATOCRIT (CANCER CENTER ONLY)
HCT: 30 % — ABNORMAL LOW (ref 39.0–52.0)
Hemoglobin: 9.1 g/dL — ABNORMAL LOW (ref 13.0–17.0)

## 2023-05-25 MED ORDER — EPOETIN ALFA-EPBX 40000 UNIT/ML IJ SOLN
40000.0000 [IU] | INTRAMUSCULAR | Status: DC
  Administered 2023-05-25: 40000 [IU] via SUBCUTANEOUS
  Filled 2023-05-25: qty 1

## 2023-05-25 MED ORDER — CEFUROXIME AXETIL 250 MG PO TABS
250.0000 mg | ORAL_TABLET | Freq: Two times a day (BID) | ORAL | 0 refills | Status: DC
Start: 2023-05-25 — End: 2023-06-23

## 2023-05-25 NOTE — Telephone Encounter (Signed)
 Spoke with pt. Pt. Advised to start medication today and take past surgery. Patient verbalized understanding.

## 2023-05-25 NOTE — Telephone Encounter (Signed)
-----   Message from Verna Czech [facility] sent at 05/21/2023  5:29 PM EDT ----- Preop urine culture was positive.  Please send Rx cefuroxime 250 mg twice daily x 7 days.

## 2023-05-26 ENCOUNTER — Ambulatory Visit: Payer: Self-pay | Admitting: Urgent Care

## 2023-05-26 ENCOUNTER — Other Ambulatory Visit: Payer: Self-pay

## 2023-05-26 ENCOUNTER — Encounter: Payer: Self-pay | Admitting: Urology

## 2023-05-26 ENCOUNTER — Inpatient Hospital Stay

## 2023-05-26 ENCOUNTER — Ambulatory Visit

## 2023-05-26 ENCOUNTER — Ambulatory Visit
Admission: RE | Admit: 2023-05-26 | Discharge: 2023-05-26 | Disposition: A | Discharge status: Home / Self Care | Attending: Urology | Admitting: Urology

## 2023-05-26 ENCOUNTER — Encounter
Admission: RE | Disposition: A | Discharge status: Home / Self Care | Payer: Self-pay | Source: Home / Self Care | Attending: Urology

## 2023-05-26 DIAGNOSIS — I129 Hypertensive chronic kidney disease with stage 1 through stage 4 chronic kidney disease, or unspecified chronic kidney disease: Secondary | ICD-10-CM | POA: Insufficient documentation

## 2023-05-26 DIAGNOSIS — N401 Enlarged prostate with lower urinary tract symptoms: Secondary | ICD-10-CM | POA: Diagnosis not present

## 2023-05-26 DIAGNOSIS — N184 Chronic kidney disease, stage 4 (severe): Secondary | ICD-10-CM | POA: Diagnosis not present

## 2023-05-26 DIAGNOSIS — D631 Anemia in chronic kidney disease: Secondary | ICD-10-CM | POA: Insufficient documentation

## 2023-05-26 DIAGNOSIS — Z8551 Personal history of malignant neoplasm of bladder: Secondary | ICD-10-CM

## 2023-05-26 DIAGNOSIS — Z951 Presence of aortocoronary bypass graft: Secondary | ICD-10-CM | POA: Diagnosis not present

## 2023-05-26 DIAGNOSIS — I2510 Atherosclerotic heart disease of native coronary artery without angina pectoris: Secondary | ICD-10-CM | POA: Insufficient documentation

## 2023-05-26 DIAGNOSIS — Z87891 Personal history of nicotine dependence: Secondary | ICD-10-CM | POA: Insufficient documentation

## 2023-05-26 DIAGNOSIS — N1330 Unspecified hydronephrosis: Secondary | ICD-10-CM | POA: Diagnosis present

## 2023-05-26 DIAGNOSIS — Z8546 Personal history of malignant neoplasm of prostate: Secondary | ICD-10-CM | POA: Diagnosis not present

## 2023-05-26 DIAGNOSIS — Z860101 Personal history of adenomatous and serrated colon polyps: Secondary | ICD-10-CM | POA: Diagnosis not present

## 2023-05-26 DIAGNOSIS — Z79899 Other long term (current) drug therapy: Secondary | ICD-10-CM | POA: Insufficient documentation

## 2023-05-26 DIAGNOSIS — C9000 Multiple myeloma not having achieved remission: Secondary | ICD-10-CM | POA: Insufficient documentation

## 2023-05-26 HISTORY — PX: CYSTOSCOPY W/ RETROGRADES: SHX1426

## 2023-05-26 HISTORY — DX: Cyst of kidney, acquired: N28.1

## 2023-05-26 HISTORY — DX: Cataract extraction status, right eye: Z98.41

## 2023-05-26 SURGERY — CYSTOSCOPY, WITH RETROGRADE PYELOGRAM
Anesthesia: General | Site: Penis | Laterality: Bilateral

## 2023-05-26 MED ORDER — SODIUM CHLORIDE 0.9 % IR SOLN
Status: DC | PRN
  Administered 2023-05-26 (×2): 3000 mL

## 2023-05-26 MED ORDER — PHENYLEPHRINE HCL-NACL 20-0.9 MG/250ML-% IV SOLN
INTRAVENOUS | Status: DC | PRN
  Administered 2023-05-26: 20 ug/min via INTRAVENOUS
  Administered 2023-05-26: 30 ug/min via INTRAVENOUS

## 2023-05-26 MED ORDER — EPHEDRINE 5 MG/ML INJ
INTRAVENOUS | Status: AC
  Filled 2023-05-26: qty 5

## 2023-05-26 MED ORDER — ONDANSETRON HCL 4 MG/2ML IJ SOLN: 4.0000 mg | Freq: Once | INTRAMUSCULAR | Status: DC | PRN

## 2023-05-26 MED ORDER — LIDOCAINE HCL (PF) 2 % IJ SOLN
INTRAMUSCULAR | Status: AC
Start: 2023-05-26 — End: ?
  Filled 2023-05-26: qty 5

## 2023-05-26 MED ORDER — FENTANYL CITRATE (PF) 100 MCG/2ML IJ SOLN
INTRAMUSCULAR | Status: AC
Start: 2023-05-26 — End: ?
  Filled 2023-05-26: qty 2

## 2023-05-26 MED ORDER — PROPOFOL 10 MG/ML IV BOLUS
INTRAVENOUS | Status: DC | PRN
  Administered 2023-05-26: 150 mg via INTRAVENOUS

## 2023-05-26 MED ORDER — DEXAMETHASONE SODIUM PHOSPHATE 10 MG/ML IJ SOLN
INTRAMUSCULAR | Status: AC
  Filled 2023-05-26: qty 1

## 2023-05-26 MED ORDER — DEXMEDETOMIDINE HCL IN NACL 80 MCG/20ML IV SOLN
INTRAVENOUS | Status: DC | PRN
Start: 2023-05-26 — End: 2023-05-26
  Administered 2023-05-26: 4 ug via INTRAVENOUS

## 2023-05-26 MED ORDER — ORAL CARE MOUTH RINSE: 15.0000 mL | Freq: Once | OROMUCOSAL | Status: AC

## 2023-05-26 MED ORDER — DEXAMETHASONE SODIUM PHOSPHATE 10 MG/ML IJ SOLN
INTRAMUSCULAR | Status: DC | PRN
  Administered 2023-05-26: 5 mg via INTRAVENOUS

## 2023-05-26 MED ORDER — EPHEDRINE SULFATE-NACL 50-0.9 MG/10ML-% IV SOSY
PREFILLED_SYRINGE | INTRAVENOUS | Status: DC | PRN
  Administered 2023-05-26: 10 mg via INTRAVENOUS

## 2023-05-26 MED ORDER — OXYBUTYNIN CHLORIDE 5 MG PO TABS
ORAL_TABLET | ORAL | Status: AC
  Filled 2023-05-26: qty 1

## 2023-05-26 MED ORDER — OXYCODONE HCL 5 MG PO TABS
5.0000 mg | ORAL_TABLET | Freq: Once | ORAL | Status: AC | PRN
  Administered 2023-05-26: 5 mg via ORAL

## 2023-05-26 MED ORDER — ROCURONIUM BROMIDE 100 MG/10ML IV SOLN
INTRAVENOUS | Status: DC | PRN
  Administered 2023-05-26: 50 mg via INTRAVENOUS

## 2023-05-26 MED ORDER — OXYCODONE HCL 5 MG/5ML PO SOLN: 5.0000 mg | Freq: Once | ORAL | Status: AC | PRN

## 2023-05-26 MED ORDER — PHENYLEPHRINE 80 MCG/ML (10ML) SYRINGE FOR IV PUSH (FOR BLOOD PRESSURE SUPPORT)
PREFILLED_SYRINGE | INTRAVENOUS | Status: DC | PRN
  Administered 2023-05-26 (×5): 80 ug via INTRAVENOUS
  Administered 2023-05-26: 160 ug via INTRAVENOUS
  Administered 2023-05-26: 80 ug via INTRAVENOUS

## 2023-05-26 MED ORDER — PHENYLEPHRINE HCL-NACL 20-0.9 MG/250ML-% IV SOLN
INTRAVENOUS | Status: AC
  Filled 2023-05-26: qty 250

## 2023-05-26 MED ORDER — MIDAZOLAM HCL 2 MG/2ML IJ SOLN
INTRAMUSCULAR | Status: AC
  Filled 2023-05-26: qty 2

## 2023-05-26 MED ORDER — FENTANYL CITRATE (PF) 100 MCG/2ML IJ SOLN
INTRAMUSCULAR | Status: DC | PRN
  Administered 2023-05-26: 25 ug via INTRAVENOUS
  Administered 2023-05-26: 50 ug via INTRAVENOUS
  Administered 2023-05-26: 25 ug via INTRAVENOUS

## 2023-05-26 MED ORDER — ACETAMINOPHEN 10 MG/ML IV SOLN
INTRAVENOUS | Status: DC | PRN
  Administered 2023-05-26: 1000 mg via INTRAVENOUS

## 2023-05-26 MED ORDER — FENTANYL CITRATE (PF) 100 MCG/2ML IJ SOLN
25.0000 ug | INTRAMUSCULAR | Status: DC | PRN
  Administered 2023-05-26 (×2): 50 ug via INTRAVENOUS

## 2023-05-26 MED ORDER — ACETAMINOPHEN 10 MG/ML IV SOLN: 1000.0000 mg | Freq: Once | INTRAVENOUS | Status: DC | PRN

## 2023-05-26 MED ORDER — SUGAMMADEX SODIUM 200 MG/2ML IV SOLN
INTRAVENOUS | Status: AC
  Filled 2023-05-26: qty 2

## 2023-05-26 MED ORDER — ROCURONIUM BROMIDE 10 MG/ML (PF) SYRINGE
PREFILLED_SYRINGE | INTRAVENOUS | Status: AC
  Filled 2023-05-26: qty 10

## 2023-05-26 MED ORDER — PHENYLEPHRINE 80 MCG/ML (10ML) SYRINGE FOR IV PUSH (FOR BLOOD PRESSURE SUPPORT)
PREFILLED_SYRINGE | INTRAVENOUS | Status: AC
  Filled 2023-05-26: qty 10

## 2023-05-26 MED ORDER — ONDANSETRON HCL 4 MG/2ML IJ SOLN
INTRAMUSCULAR | Status: DC | PRN
  Administered 2023-05-26: 4 mg via INTRAVENOUS

## 2023-05-26 MED ORDER — IODIXANOL 320 MG/ML IV SOLN
INTRAVENOUS | Status: DC | PRN
Start: 2023-05-26 — End: 2023-05-26
  Administered 2023-05-26: 100 mL

## 2023-05-26 MED ORDER — LACTATED RINGERS IV SOLN: INTRAVENOUS | Status: DC

## 2023-05-26 MED ORDER — ONDANSETRON HCL 4 MG/2ML IJ SOLN
INTRAMUSCULAR | Status: AC
  Filled 2023-05-26: qty 2

## 2023-05-26 MED ORDER — SUGAMMADEX SODIUM 200 MG/2ML IV SOLN
INTRAVENOUS | Status: DC | PRN
  Administered 2023-05-26: 200 mg via INTRAVENOUS

## 2023-05-26 MED ORDER — CHLORHEXIDINE GLUCONATE 0.12 % MT SOLN
15.0000 mL | Freq: Once | OROMUCOSAL | Status: AC
  Administered 2023-05-26: 15 mL via OROMUCOSAL

## 2023-05-26 MED ORDER — ACETAMINOPHEN 10 MG/ML IV SOLN
INTRAVENOUS | Status: AC
  Filled 2023-05-26: qty 100

## 2023-05-26 MED ORDER — OXYBUTYNIN CHLORIDE 5 MG PO TABS
5.0000 mg | ORAL_TABLET | Freq: Once | ORAL | Status: AC
  Administered 2023-05-26: 5 mg via ORAL
  Filled 2023-05-26: qty 1

## 2023-05-26 MED ORDER — MIDAZOLAM HCL 2 MG/2ML IJ SOLN
INTRAMUSCULAR | Status: DC | PRN
  Administered 2023-05-26 (×2): 1 mg via INTRAVENOUS

## 2023-05-26 MED ORDER — OXYBUTYNIN CHLORIDE 5 MG PO TABS: ORAL_TABLET | ORAL | 0 refills | Status: DC

## 2023-05-26 MED ORDER — OXYCODONE HCL 5 MG PO TABS
ORAL_TABLET | ORAL | Status: AC
  Filled 2023-05-26: qty 1

## 2023-05-26 MED ORDER — CHLORHEXIDINE GLUCONATE 0.12 % MT SOLN
OROMUCOSAL | Status: AC
  Filled 2023-05-26: qty 15

## 2023-05-26 MED ORDER — SODIUM CHLORIDE 0.9 % IV SOLN
1.0000 g | INTRAVENOUS | Status: AC
  Administered 2023-05-26: 1 g via INTRAVENOUS
  Filled 2023-05-26: qty 10

## 2023-05-26 MED ORDER — LIDOCAINE HCL (CARDIAC) PF 100 MG/5ML IV SOSY
PREFILLED_SYRINGE | INTRAVENOUS | Status: DC | PRN
  Administered 2023-05-26: 100 mg via INTRAVENOUS

## 2023-05-26 SURGICAL SUPPLY — 18 items
ADAPTER IRRIG TUBE 2 SPIKE SOL (ADAPTER) IMPLANT
BAG DRAIN SIEMENS DORNER NS (MISCELLANEOUS) ×1 IMPLANT
BAG URINE DRAIN 2000ML AR STRL (UROLOGICAL SUPPLIES) IMPLANT
BRUSH SCRUB EZ 4% CHG (MISCELLANEOUS) ×1 IMPLANT
CATH FOL 2WAY LX 18X30 (CATHETERS) IMPLANT
CATH URETL OPEN END 6X70 (CATHETERS) ×1 IMPLANT
DRAPE UTILITY 15X26 TOWEL STRL (DRAPES) ×1 IMPLANT
GLOVE BIOGEL PI IND STRL 7.5 (GLOVE) ×1 IMPLANT
GOWN STRL REUS W/ TWL LRG LVL3 (GOWN DISPOSABLE) ×1 IMPLANT
GOWN STRL REUS W/ TWL XL LVL3 (GOWN DISPOSABLE) ×1 IMPLANT
GUIDEWIRE STR DUAL SENSOR (WIRE) ×1 IMPLANT
KIT TURNOVER CYSTO (KITS) ×1 IMPLANT
LOOP CUT BIPOLAR 24F LRG (ELECTROSURGICAL) IMPLANT
PACK CYSTO AR (MISCELLANEOUS) ×1 IMPLANT
SET CYSTO W/LG BORE CLAMP LF (SET/KITS/TRAYS/PACK) ×1 IMPLANT
SOL .9 NS 3000ML IRR UROMATIC (IV SOLUTION) ×1 IMPLANT
SURGILUBE 2OZ TUBE FLIPTOP (MISCELLANEOUS) ×1 IMPLANT
WATER STERILE IRR 500ML POUR (IV SOLUTION) ×1 IMPLANT

## 2023-05-26 NOTE — Interval H&P Note (Signed)
 History and Physical Interval Note:  05/26/2023 11:50 AM  [name]  has presented today for surgery, with the diagnosis of Bilateral Hydronephrosis History of Bladder Cancer.  The various methods of treatment have been discussed with the patient and family. After consideration of risks, benefits and other options for treatment, the patient has consented to  Procedure(s) with comments: CYSTOSCOPY, WITH RETROGRADE PYELOGRAM (Bilateral) - POSSIBLE STENT, POSSIBLE BIOPSY, POSSIBLE REMOVAL OF LESIONS as a surgical intervention.  The patient's history has been reviewed, patient examined, no change in status, stable for surgery.  I have reviewed the patient's chart and labs.  Questions were answered to the patient's satisfaction.    CV: RRR Lungs: Clear   [place] Athanas C Sible Straley

## 2023-05-26 NOTE — H&P (Signed)
 Urology H&P  Urological history: 1. 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  Radiation cystitis hyperbaric oxygen therapy  Assessment/Recommendations:  1.  Bilateral hydronephrosis Recent creatinine bump 5.12 and RUS with mild bilateral hydronephrosis. Presents for cystoscopy with bilateral retrograde pyelogram and possible bilateral ureteral stent placement.  2.  History urothelial carcinoma bladder We discussed the possible need for TURBT/bladder biopsy if any bladder mucosal lesions are identified    History of Present Illness: [name] is a 78 y.o. with CKD and incomplete bladder emptying.  Initial improvement in creatinine with Foley catheter drainage and CIC.  Recent creatinine 5.12 and renal ultrasound showed mild, bilateral hydronephrosis.  Scheduled for cystoscopy with bilateral retrograde pyelograms to assess for post renal causes of his CKD.  Preoperative urine culture with E. coli though asymptomatic and consistent with colonization.  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 4v CABG on 04/13/2002. b.) LHC 02/03/2013 --> LVEF 50%; severe 3v CAD with patent LIMA-LAD and SVG-OM1 grafts; SVG-PDA and SVG-DG1 occluded with collaterals to PDA and DG1 from LAD.   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   a.) LIMA-LAD, SVG-OM1, SVG-D1, SVG-PDA   Smoldering multiple myeloma    Tubular adenoma of colon 02/03/2007    Past Surgical History:  Procedure Laterality Date   CARDIAC CATHETERIZATION Left 04/13/2002   Procedure: CARDIAC CATHETERIZATION; Location: [facility]; Surgeon; Marcina Millard, MD   CARDIAC CATHETERIZATION Left 02/03/2013   Procedure: CARDIAC CATHETERIZATION; Location: [facility]; Surgeon: Marcina Millard, MD   CATARACT EXTRACTION W/ INTRAOCULAR LENS IMPLANT Bilateral 2021   COLONOSCOPY W/ POLYPECTOMY     COLONOSCOPY WITH PROPOFOL N/A 04/09/2015   Procedure: COLONOSCOPY WITH PROPOFOL;  Surgeon: Wallace Cullens, MD;  Location: [facility] ENDOSCOPY;  Service: Gastroenterology;  Laterality: N/A;   CORONARY ANGIOPLASTY     CORONARY ARTERY BYPASS GRAFT N/A 04/13/2002   Procedure: 4v CABG (LIMA-LAD, SVG-OM1, SVG-D1, SVG-PDA); Location: [facility]; Surgeon: Rana Snare, MD   CYSTOSCOPY N/A 07/17/2021  Procedure: CYSTOSCOPY;  Surgeon: [name], MD;  Location: [facility] ORS;  Service: Urology;  Laterality: N/A;   CYSTOSCOPY N/A 06/24/2022   Procedure: DIAGNOSTIC CYSTOSCOPY;  Surgeon: [name], MD;  Location: [facility] OR[facility];  Service: Urology;  Laterality: N/A;    CYSTOSCOPY WITH BIOPSY N/A 07/17/2021   Procedure: CYSTOSCOPY WITH BIOPSY;  Surgeon: [name], MD;  Location: [facility];  Service: Urology;  Laterality: N/A;   CYSTOSCOPY WITH BIOPSY N/A 06/24/2022   Procedure: CYSTOSCOPY WITH BLADDER BIOPSY;  Surgeon: [name], MD;  Location: [facility];  Service: Urology;  Laterality: N/A;   CYSTOSCOPY WITH FULGERATION N/A 07/17/2021   Procedure: CYSTOSCOPY WITH FULGERATION;  Surgeon: [name], MD;  Location: [facility];  Service: Urology;  Laterality: N/A;   CYSTOSCOPY WITH FULGERATION N/A 06/24/2022   Procedure: CYSTOSCOPY WITH FULGERATION;  Surgeon: [name], MD;  Location: [facility];  Service: Urology;  Laterality: N/A;   IR BONE MARROW BIOPSY & ASPIRATION  12/17/2022   TONSILLECTOMY     TRANSURETHRAL RESECTION OF BLADDER TUMOR N/A 11/13/2020   Procedure: TRANSURETHRAL RESECTION OF BLADDER TUMOR (TURBT);  Surgeon: [name], MD;  Location: [facility];  Service: Urology;  Laterality: N/A;   TRANSURETHRAL RESECTION OF BLADDER TUMOR N/A 06/24/2022   Procedure: TRANSURETHRAL RESECTION OF BLADDER TUMOR (TURBT);  Surgeon: [name], MD;  Location: [facility];  Service: Urology;  Laterality: N/A;   TRANSURETHRAL RESECTION OF BLADDER TUMOR WITH GYRUS (TURBT-GYRUS)  06/24/2022   TRANSURETHRAL RESECTION OF PROSTATE      Home Medications:  Current Meds  Medication Sig   acetaminophen (TYLENOL) 500 MG tablet Take 500 mg by mouth every 6 (six) hours as needed for moderate pain (pain score 4-6).   atorvastatin (LIPITOR) 10 MG tablet Take 10 mg by mouth at bedtime.   cefUROXime (CEFTIN) 250 MG tablet Take 1 tablet (250 mg total) by mouth 2 (two) times daily with a meal.   cetirizine (ZYRTEC) 10 MG tablet Take 10 mg by mouth daily as needed for allergies.   Coenzyme Q10 100 MG capsule Take 100 mg by mouth daily.    feeding supplement (ENSURE ENLIVE / ENSURE PLUS) LIQD Take 237 mLs by mouth 3 (three) times daily between meals.  (Patient taking differently: Take 237 mLs by mouth every other day.)   finasteride (PROSCAR) 5 MG tablet Take 5 mg by mouth daily with lunch.   fluticasone (FLONASE) 50 MCG/ACT nasal spray Place 1 spray into both nostrils daily as needed for allergies.   GLUCOSAMINE-CHONDROITIN PO Take 1 tablet by mouth 2 (two) times daily.   Magnesium 200 MG TABS Take 200 mg by mouth 2 (two) times daily.   metroNIDAZOLE (METROGEL) 0.75 % gel Apply 1 application topically 2 (two) times daily.   Multiple Vitamin (MULTIVITAMIN) tablet Take 1 tablet by mouth daily.   nitroGLYCERIN (NITROSTAT) 0.4 MG SL tablet Place 0.4 mg under the tongue every 5 (five) minutes as needed for chest pain.   Omega-3 Fatty Acids (FISH OIL) 1000 MG CAPS Take 1,000 mg by mouth daily.   Probiotic Product (MEGA PROBIOTIC) CAPS Take 1 capsule by mouth in the morning and at bedtime.   silodosin (RAPAFLO) 8 MG CAPS capsule TAKE 1 CAPSULE BY MOUTH DAILY WITH BREAKFAST   Vitamin A 2400 MCG (8000 UT) CAPS Take 8,000 Units by mouth daily.   vitamin C (ASCORBIC ACID) 250 MG tablet Take 250 mg by mouth 2 (two) times daily.   zinc gluconate 50  MG tablet Take 50 mg by mouth daily.    Allergies:  Allergies  Allergen Reactions   Cidex [Glutaral] Anaphylaxis   Other Anaphylaxis    - Ortho-phthalaldehyde  - trospion cause AMS    Family History  Problem Relation Age of Onset   Prostate cancer Father        Metastatic   Cancer Father     Social History:  reports that he quit smoking about 17 years ago. His smoking use included cigarettes. He started smoking about 77 years ago. He has a 45 pack-year smoking history. He has been exposed to tobacco smoke. He has never used smokeless tobacco. He reports current alcohol use of about 2.0 standard drinks of alcohol per week. He reports that he does not use drugs.  ROS: A complete review of systems was performed.  All systems are negative except for pertinent findings as noted.  Physical Exam:   Vital signs in last 24 hours: Temp:  [97.5 F (36.4 C)] 97.5 F (36.4 C) (04/08 0950) Pulse Rate:  [85] 85 (04/08 0950) Resp:  [18] 18 (04/08 0950) BP: (114-118)/(55-62) 114/55 (04/08 0950) SpO2:  [98 %] 98 % (04/08 0950) Constitutional:  Alert and oriented, No acute distress HEENT: [place] AT Respiratory: Normal respiratory effort,  Psychiatric: Normal mood and affect   Laboratory Data:  Recent Labs    05/25/23 1316  HGB 9.1*  HCT 30.0*   No results for input(s): "NA", "K", "CL", "CO2", "GLUCOSE", "BUN", "CREATININE", "CALCIUM" in the last 72 hours. No results for input(s): "LABPT", "INR" in the last 72 hours. No results for input(s): "LABURIN" in the last 72 hours. Results for orders placed or performed in visit on 05/18/23  CULTURE, URINE COMPREHENSIVE     Status: Abnormal   Collection Time: 05/18/23  1:32 PM   Specimen: Urine   UR  Result Value Ref Range Status   Urine Culture, Comprehensive Final report (A)  Final   Organism ID, Bacteria Comment (A)  Final    Comment: Escherichia coli, identified by an automated biochemical system. Cefazolin with an MIC <=16 predicts susceptibility to the oral agents cefaclor, cefdinir, cefpodoxime, cefprozil, cefuroxime, cephalexin, and loracarbef when used for therapy of uncomplicated urinary tract infections due to E. coli, Klebsiella pneumoniae, and Proteus mirabilis. Multi-Drug Resistant Organism Greater than 100,000 colony forming units per mL    ANTIMICROBIAL SUSCEPTIBILITY Comment  Final    Comment:       ** S = Susceptible; I = Intermediate; R = Resistant **                    P = Positive; N = Negative             MICS are expressed in micrograms per mL    Antibiotic                 RSLT#1    RSLT#2    RSLT#3    RSLT#4 Amoxicillin/Clavulanic Acid    S Ampicillin                     S Cefazolin                      S Cefepime                       S Cefoxitin  S Cefpodoxime                     S Ceftriaxone                    S Ciprofloxacin                  S Ertapenem                      S Gentamicin                     R Levofloxacin                   R Meropenem                      S Nitrofurantoin                 S Piperacillin/Tazobactam        S Tetracycline                   R Tobramycin                     I Trimethoprim/Sulfa             R   Microscopic Examination     Status: Abnormal   Collection Time: 05/18/23  1:32 PM   Urine  Result Value Ref Range Status   WBC, UA >30 (A) 0 - 5 /hpf Final   RBC, Urine 11-30 (A) 0 - 2 /hpf Final   Epithelial Cells (non renal) 0-10 0 - 10 /hpf Final   Bacteria, UA Many (A) None seen/Few Final     05/26/2023, 11:45 AM  Irineo Axon,  MD

## 2023-05-26 NOTE — Anesthesia Procedure Notes (Signed)
 Procedure Name: Intubation Date/Time: 05/26/2023 12:00 PM  Performed by: Elisabeth Pigeon, CRNAPre-anesthesia Checklist: Patient identified, Patient being monitored, Timeout performed, Emergency Drugs available and Suction available Patient Re-evaluated:Patient Re-evaluated prior to induction Oxygen Delivery Method: Circle system utilized Preoxygenation: Pre-oxygenation with 100% oxygen Induction Type: IV induction Ventilation: Mask ventilation without difficulty Laryngoscope Size: Mac, 3 and McGrath Grade View: Grade I Tube type: Oral Tube size: 7.0 mm Number of attempts: 1 Airway Equipment and Method: Stylet Placement Confirmation: ETT inserted through vocal cords under direct vision, positive ETCO2 and breath sounds checked- equal and bilateral Secured at: 23 cm Tube secured with: Tape Dental Injury: Teeth and Oropharynx as per pre-operative assessment

## 2023-05-26 NOTE — Anesthesia Preprocedure Evaluation (Addendum)
 Anesthesia Evaluation  Patient identified by MRN, date of birth, ID band Patient awake    Reviewed: Allergy & Precautions, NPO status , Patient's Chart, lab work & pertinent test results  History of Anesthesia Complications Negative for: history of anesthetic complications  Airway Mallampati: III   Neck ROM: Full    Dental  (+) Missing, Chipped   Pulmonary former smoker (quit 2008)   Pulmonary exam normal breath sounds clear to auscultation       Cardiovascular hypertension, + CAD (s/p CABG 2004)  Normal cardiovascular exam Rhythm:Regular Rate:Normal  ECG 12/16/22: RBBB; no STEMI  Myocardial perfusion 09/22/18:  1.  Gated ETT  2.  Mild reduced left ventricular function  3.  Inferior wall hypokinesis  4.  Moderate inferior scar without evidence for ischemia    Neuro/Psych negative neurological ROS     GI/Hepatic negative GI ROS,,,  Endo/Other  negative endocrine ROS    Renal/GU Renal disease (stage IV CKD)   Bladder CA; prostate CA    Musculoskeletal   Abdominal   Peds  Hematology  (+) Blood dyscrasia, anemia   Anesthesia Other Findings Reviewed and agree with Edd Fabian pre-anesthesia clinical review note.    Cardiology note 03/12/23:  78 year old gentleman with known coronary disease, status post CABG, without exertional chest pain. ETT sestamibi study 04/11/2016 revealed mildly reduced left ventricular function, with moderate inferior scar without ischemia, which was unchanged from previous study. The patient has essential hypertension, blood pressure in normal range off BP medications. The patient has hyperlipidemia, with excellent control of LDL cholesterol on atorvastatin.  Plan   1. Continue current medications 2. Counseled patient about low-sodium diet 3. DASH diet printed instructions given to the patient 4. Counseled patient about low-cholesterol diet 5. Continue atorvastatin for hyperlipidemia  management 6. Low-fat and cholesterol diet printed instructions given to the patient 7. Return to clinic for follow-up in 4 months   No orders of the defined types were placed in this encounter.  Return in about 4 months (around 07/10/2023).    Reproductive/Obstetrics                             Anesthesia Physical Anesthesia Plan  ASA: 3  Anesthesia Plan: General   Post-op Pain Management:    Induction: Intravenous  PONV Risk Score and Plan: 2 and Ondansetron, Dexamethasone and Treatment may vary due to age or medical condition  Airway Management Planned: Oral ETT  Additional Equipment:   Intra-op Plan:   Post-operative Plan: Extubation in OR  Informed Consent: I have reviewed the patients History and Physical, chart, labs and discussed the procedure including the risks, benefits and alternatives for the proposed anesthesia with the patient or authorized representative who has indicated his/her understanding and acceptance.     Dental advisory given  Plan Discussed with: CRNA  Anesthesia Plan Comments: (Patient consented for risks of anesthesia including but not limited to:  - adverse reactions to medications - damage to eyes, teeth, lips or other oral mucosa - nerve damage due to positioning  - sore throat or hoarseness - damage to heart, brain, nerves, lungs, other parts of body or loss of life  Informed patient about role of CRNA in peri- and intra-operative care.  Patient voiced understanding.)        Anesthesia Quick Evaluation

## 2023-05-26 NOTE — Anesthesia Postprocedure Evaluation (Signed)
 Anesthesia Post Note  Patient: [name]  Procedure(s) Performed: CYSTOSCOPY, WITH RETROGRADE PYELOGRAM (Bilateral: Penis)  Patient location during evaluation: PACU Anesthesia Type: General Level of consciousness: awake and alert Pain management: pain level controlled Vital Signs Assessment: post-procedure vital signs reviewed and stable Respiratory status: spontaneous breathing, nonlabored ventilation, respiratory function stable and patient connected to nasal cannula oxygen Cardiovascular status: blood pressure returned to baseline and stable Postop Assessment: no apparent nausea or vomiting Anesthetic complications: no   There were no known notable events for this encounter.   Last Vitals:  Vitals:   05/26/23 1346 05/26/23 1415  BP:  (!) 109/55  Pulse: 86 85  Resp: 13 13  Temp:  (!) 36.4 C  SpO2: 94% 98%    Last Pain:  Vitals:   05/26/23 1415  TempSrc:   PainSc: 4                  Corinda Gubler

## 2023-05-26 NOTE — Discharge Instructions (Signed)
 DISCHARGE INSTRUCTIONS FOR URETERAL STENT   MEDICATIONS:  1. Resume all your other meds from home.  2.  AZO (over-the-counter) can help with the burning/stinging when you urinate. 3.  Oxybutynin can be taken as needed for bladder/stent irritation, Rx was sent to your pharmacy.  ACTIVITY:  1. May resume regular activities in 24 hours. 2. No driving while on narcotic pain medications  3. Drink plenty of water  4. Continue to walk at home - you can still get blood clots when you are at home, so keep active, but don't over do it.  5. May return to work/school tomorrow or when you feel ready    SIGNS/SYMPTOMS TO CALL:  Common postoperative symptoms include urinary frequency, urgency, bladder spasm and blood in the urine  Please call us if you have a fever greater than 101.5, uncontrolled nausea/vomiting, uncontrolled pain, dizziness, unable to urinate, excessively bloody urine, chest pain, shortness of breath, leg swelling, leg pain, or any other concerns or questions.   You can reach [place] at [phone].   FOLLOW-UP:  1. You will be contacted for an appointment for catheter removal in 48 hours

## 2023-05-26 NOTE — Transfer of Care (Signed)
 Immediate Anesthesia Transfer of Care Note  Patient: [name]  Procedure(s) Performed: CYSTOSCOPY, WITH RETROGRADE PYELOGRAM (Bilateral: Penis)  Patient Location: PACU  Anesthesia Type:General  Level of Consciousness: awake and patient cooperative  Airway & Oxygen Therapy: Patient Spontanous Breathing and Patient connected to face mask oxygen  Post-op Assessment: Report given to RN and Post -op Vital signs reviewed and stable  Post vital signs: stable  Last Vitals:  Vitals Value Taken Time  BP 150/87 05/26/23 1330  Temp 36.8 C 05/26/23 1330  Pulse 108 05/26/23 1332  Resp 14 05/26/23 1332  SpO2 99 % 05/26/23 1332  Vitals shown include unfiled device data.  Last Pain:  Vitals:   05/26/23 0950  TempSrc: Tympanic         Complications: There were no known notable events for this encounter.

## 2023-05-26 NOTE — Op Note (Signed)
   Preoperative diagnosis:  Bilateral hydronephrosis History urothelial carcinoma bladder  Postoperative diagnosis:  Same  Procedure: Cystoscopy with bladder biopsies/fulguration Cystogram Right retrograde pyelogram with interpretation Placement right ureteral stent  Surgeon: [name], MD  Anesthesia: General  Complications: None  Intraoperative findings:  Cystoscopy: Urethra normal in caliber without stricture; prostate nonocclusive.  No solid or papillary lesions.  Scattered telangiectasia/hyperemia consistent with radiation change.  Left UO unable to be visualized.  Right UO normal in appearance Right retrograde pyelogram: Moderate right hydronephrosis/hydroureter to the distal ureter with narrowing distal ureter.  No filling defect Cystogram: No vesicoureteral reflux noted  EBL: 10 cc  Specimens:  Bladder biopsies  Indication: OSRIC KLOPF is a 78 y.o. male with a history of recurrent urothelial carcinoma the bladder and history of prostate cancer status post IMRT.  Noted late December 2024 to have AKI with RUS showing mild to moderate left hydronephrosis and mild to moderate right hydronephrosis.  Was also noted to have incomplete bladder emptying.  Foley catheter was placed with improvement in his renal function and follow-up RUS with improvement in his hydronephrosis.  Repeat RUS 04/27/2023 with mild-moderate bilateral hydronephrosis worse from previous imaging and bilateral retrograde pyelogram with possible ureteral stent placement recommended.  After reviewing the management options for treatment, he elected to proceed with the above surgical procedure(s). We have discussed the potential benefits and risks of the procedure, side effects of the proposed treatment, the likelihood of the patient achieving the goals of the procedure, and any potential problems that might occur during the procedure or recuperation. Informed consent has been obtained.  Description of  procedure:  The patient was taken to the operating room and general anesthesia was induced.  The patient was placed in the dorsal lithotomy position, prepped and draped in the usual sterile fashion, and preoperative antibiotics were administered. A preoperative time-out was performed.   A 21 French cystoscope was lubricated, placed per urethra and advanced into the bladder under direct vision.  There was suboptimal visualization with the cystoscope due to oozing from hyperemic mucosa.  The cystoscope was removed and replaced with a 24 French continuous-flow resectoscope sheath with obturator.  A laser bridge was placed into the sheath and panendoscopy was then performed with findings described above.  120 cc of contrast was placed through the resectoscope and imaging showed a small capacity bladder and no evidence of vesicoureteral reflux.  A 0.038 Sensor wire was placed through the laser bridge and into the right ureteral orifice.  A 78F open-ended ureteral catheter was then placed over the guidewire followed by guidewire removal.  Right retrograde pyelogram was performed with findings as described above.  The resectoscope was removed and replaced with the cystoscope which was backloaded on the guidewire.  A 78F/24 cm Contour ureteral stent was placed over the wire with good positioning noted in the renal pelvis and bladder.  The cystoscope was then replaced with the resectoscope sheath.  Biopsies of area of hyperemic mucosa were then obtained.  Close inspection of the left sided bladder was continued and no UO could be identified.  Spot cauterization was obtained with a resectoscope loop.  At the completion of the procedure hemostasis was noted to be adequate.  A 178F Foley catheter was placed to gravity drainage.  Plan: Foley catheter removal ~ 48 hours Schedule left percutaneous nephrostomy   [name], M.D.

## 2023-05-27 ENCOUNTER — Encounter: Payer: Self-pay | Admitting: Urology

## 2023-05-27 ENCOUNTER — Other Ambulatory Visit: Payer: Self-pay | Admitting: Urology

## 2023-05-27 MED ORDER — OXYBUTYNIN CHLORIDE 5 MG PO TABS
ORAL_TABLET | ORAL | Status: DC
Start: 2023-05-27 — End: 2023-06-01

## 2023-05-28 ENCOUNTER — Other Ambulatory Visit: Payer: Self-pay

## 2023-05-28 DIAGNOSIS — N1330 Unspecified hydronephrosis: Secondary | ICD-10-CM

## 2023-05-28 LAB — SURGICAL PATHOLOGY

## 2023-05-28 NOTE — Progress Notes (Signed)
 Spoke with Dr. Lonna Cobb, "I was unable to get a left-sided stent in patient yesterday.  Needs IR referral for placement of left percutaneous nephrostomy and subsequent stent internalization." I have reached out to [place] with IR to get this coordinated-- waiting on date to schedule.Patient was advised to keep Foley Catheter in until blood clears. Patient will call us when ready to remove Foley.

## 2023-05-29 ENCOUNTER — Telehealth: Payer: Self-pay

## 2023-05-29 NOTE — Telephone Encounter (Signed)
 Spoke with pt. Pt. Advised that IR procedure has been set up for Wednesday April 23rd. Patient to arrive at 1030am and procedure will start at 1130am. He is to hold ASA for 5 days prior to- Last dose on 4/17. Patient Advised to come to [facility] the [facility]. Patient scheduled for Foley Removal on Wednesday 4/16. Patient verbalized understanding.

## 2023-05-31 ENCOUNTER — Other Ambulatory Visit: Payer: Self-pay | Admitting: Urology

## 2023-06-03 ENCOUNTER — Ambulatory Visit (INDEPENDENT_AMBULATORY_CARE_PROVIDER_SITE_OTHER): Admitting: Physician Assistant

## 2023-06-03 ENCOUNTER — Encounter: Payer: Self-pay | Admitting: Physician Assistant

## 2023-06-03 VITALS — BP 131/63 | HR 68 | Ht 66.0 in | Wt 143.0 lb

## 2023-06-03 DIAGNOSIS — N1330 Unspecified hydronephrosis: Secondary | ICD-10-CM

## 2023-06-03 NOTE — Progress Notes (Signed)
 Catheter Removal  Patient is present today for a catheter removal.  8ml of water was drained from the balloon. A 16FR foley cath was removed from the bladder, no complications were noted. Patient tolerated well.  Performed by: Delane Stalling, PA-C   Additional notes: They note his urine has been clearer than in recent memory since stent placement. He does have occasional gross hematuria with tiny clots. He is scheduled for PCN next week with IR.  He may void spontaneously, and I asked him to resume twice daily CIC tonight. If his residuals are <269mL for the next 2 days, he may stop CIC. Also checking BMP today to reassess renal function with Foley and stent in place.  Follow up: Return in about 3 months (around 09/02/2023) for Follow up with Dr. Cherylene Corrente.

## 2023-06-03 NOTE — Patient Instructions (Signed)
 Please resume urinating on your own and twice daily self-catheterization, starting tonight. If your output each time you cath is <217mL after 2 days, then you may stop self-cathing. Otherwise, continue this.

## 2023-06-04 LAB — BASIC METABOLIC PANEL WITH GFR
BUN/Creatinine Ratio: 19 (ref 10–24)
BUN: 59 mg/dL — ABNORMAL HIGH (ref 8–27)
CO2: 18 mmol/L — ABNORMAL LOW (ref 20–29)
Calcium: 8.5 mg/dL — ABNORMAL LOW (ref 8.6–10.2)
Chloride: 107 mmol/L — ABNORMAL HIGH (ref 96–106)
Creatinine, Ser: 3.15 mg/dL — ABNORMAL HIGH (ref 0.76–1.27)
Glucose: 97 mg/dL (ref 70–99)
Potassium: 4.8 mmol/L (ref 3.5–5.2)
Sodium: 138 mmol/L (ref 134–144)
eGFR: 19 mL/min/{1.73_m2} — ABNORMAL LOW (ref >59)

## 2023-06-09 ENCOUNTER — Other Ambulatory Visit (HOSPITAL_COMMUNITY): Payer: Self-pay | Admitting: Student

## 2023-06-09 DIAGNOSIS — N1330 Unspecified hydronephrosis: Secondary | ICD-10-CM

## 2023-06-09 NOTE — H&P (Signed)
 Chief Complaint: Patient was seen in consultation today for bilateral hydronephrosis status post right ureteral stent placement, with consideration for left percutaneous nephrostomy tube placement.  Referring Provider(s): Dr. Darlynn Elam, MD   Supervising Physician: Fernando Hoyer  Patient Status: [facility] - Out-pt  Patient is Full Code  History of Present Illness: [name] is a 78 y.o. 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  Per Dr. Mindi Alto Op note on 4/8: "[Patient has] a history of recurrent urothelial carcinoma the bladder and history of prostate cancer status post IMRT.  Noted late December 2024 to have AKI with RUS showing mild to moderate left hydronephrosis and mild to moderate right hydronephrosis.  Was also noted to have incomplete bladder emptying.  Foley catheter was placed with improvement in his renal function and follow-up RUS with improvement in his hydronephrosis.  Repeat RUS 04/27/2023 with mild-moderate bilateral hydronephrosis worse from previous imaging and bilateral retrograde pyelogram with possible ureteral stent placement recommended."  Patient underwent right ureteral stent placement on 4/8. However, Dr. Cherylene Corrente was unable to place left ureteral stent. Therefore, Interventional Radiology was requested for left percutaneous nephrostomy tube placement. Patient is scheduled for same in IR today.   Patient is currently without any significant complaints. Patient is alert and laying in bed, calm. Patient denies any fevers, headache, chest pain, SOB, cough, abdominal pain, nausea, vomiting or bleeding.     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 4v CABG on 04/13/2002. b.) LHC 02/03/2013 --> LVEF 50%; severe 3v CAD with patent LIMA-LAD and SVG-OM1 grafts; SVG-PDA and SVG-DG1 occluded with collaterals to PDA and DG1 from LAD.   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   a.) LIMA-LAD, SVG-OM1, SVG-D1, SVG-PDA   Smoldering multiple myeloma    Tubular adenoma of colon 02/03/2007    Past Surgical History:  Procedure Laterality Date   CARDIAC CATHETERIZATION Left 04/13/2002   Procedure: CARDIAC CATHETERIZATION; Location: [facility]; Surgeon; Percival Brace, MD   CARDIAC CATHETERIZATION Left 02/03/2013   Procedure: CARDIAC CATHETERIZATION; Location: [facility]; Surgeon: Percival Brace, MD   CATARACT EXTRACTION W/ INTRAOCULAR LENS IMPLANT Bilateral 2021   COLONOSCOPY W/ POLYPECTOMY     COLONOSCOPY  WITH PROPOFOL  N/A 04/09/2015   Procedure: COLONOSCOPY WITH PROPOFOL ;  Surgeon: Stephens Eis, MD;  Location: [facility] ENDOSCOPY;  Service: Gastroenterology;  Laterality: N/A;   CORONARY ANGIOPLASTY     CORONARY ARTERY BYPASS GRAFT N/A 04/13/2002   Procedure: 4v CABG (LIMA-LAD, SVG-OM1, SVG-D1, SVG-PDA); Location: [facility]; Surgeon: Asencion Blacksmith, MD   CYSTOSCOPY  N/A 07/17/2021   Procedure: Orin Birk;  Surgeon: Geraline Knapp, MD;  Location: [facility] ORS;  Service: Urology;  Laterality: N/A;   CYSTOSCOPY N/A 06/24/2022   Procedure: DIAGNOSTIC CYSTOSCOPY;  Surgeon: Geraline Knapp, MD;  Location: [facility] ORS;  Service: Urology;  Laterality: N/A;   CYSTOSCOPY W/ RETROGRADES Bilateral 05/26/2023   Procedure: CYSTOSCOPY, WITH RETROGRADE PYELOGRAM;  Surgeon: Geraline Knapp, MD;  Location: [facility] ORS;  Service: Urology;  Laterality: Bilateral;  right stent placement, bladder biopsy   CYSTOSCOPY WITH BIOPSY N/A 07/17/2021   Procedure: CYSTOSCOPY WITH BIOPSY;  Surgeon: Geraline Knapp, MD;  Location: [facility] ORS;  Service: Urology;  Laterality: N/A;   CYSTOSCOPY WITH BIOPSY N/A 06/24/2022   Procedure: CYSTOSCOPY WITH BLADDER BIOPSY;  Surgeon: Geraline Knapp, MD;  Location: [facility] ORS;  Service: Urology;  Laterality: N/A;   CYSTOSCOPY WITH FULGERATION N/A 07/17/2021   Procedure: CYSTOSCOPY WITH FULGERATION;  Surgeon: Geraline Knapp, MD;  Location: [facility] ORS;  Service: Urology;  Laterality: N/A;   CYSTOSCOPY WITH FULGERATION N/A 06/24/2022   Procedure: CYSTOSCOPY WITH FULGERATION;  Surgeon: Geraline Knapp, MD;  Location: [facility] ORS;  Service: Urology;  Laterality: N/A;   IR BONE MARROW BIOPSY & ASPIRATION  12/17/2022   TONSILLECTOMY     TRANSURETHRAL RESECTION OF BLADDER TUMOR N/A 11/13/2020   Procedure: TRANSURETHRAL RESECTION OF BLADDER TUMOR (TURBT);  Surgeon: Geraline Knapp, MD;  Location: [facility] ORS;  Service: Urology;  Laterality: N/A;   TRANSURETHRAL RESECTION OF BLADDER TUMOR N/A 06/24/2022   Procedure: TRANSURETHRAL RESECTION OF BLADDER TUMOR (TURBT);  Surgeon: Geraline Knapp, MD;  Location: [facility] ORS;  Service: Urology;  Laterality: N/A;   TRANSURETHRAL RESECTION OF BLADDER TUMOR WITH GYRUS (TURBT-GYRUS)  06/24/2022   TRANSURETHRAL RESECTION OF PROSTATE      Allergies: Cidex [glutaral] and Other  Medications: Prior to Admission medications    Medication Sig Start Date End Date Taking? Authorizing Provider  acetaminophen  (TYLENOL ) 500 MG tablet Take 500 mg by mouth every 6 (six) hours as needed for moderate pain (pain score 4-6).    [provider]  aspirin  81 MG tablet Take 81 mg by mouth daily.    [provider]  atorvastatin  (LIPITOR) 10 MG tablet Take 10 mg by mouth at bedtime. 04/29/22   [provider]  cefUROXime  (CEFTIN ) 250 MG tablet Take 1 tablet (250 mg total) by mouth 2 (two) times daily with a meal. 05/25/23   Stoioff, Kizzie Perks, MD  cetirizine (ZYRTEC) 10 MG tablet Take 10 mg by mouth daily as needed for allergies.    [provider]  Coenzyme Q10 100 MG capsule Take 100 mg by mouth daily.     [provider]  feeding supplement (ENSURE ENLIVE / ENSURE PLUS) LIQD Take 237 mLs by mouth 3 (three) times daily between meals. Patient taking differently: Take 237 mLs by mouth every other day. 12/25/22   Garrison Kanner, MD  finasteride  (PROSCAR ) 5 MG tablet Take 5 mg by mouth daily with lunch.    [provider]  fluticasone  (FLONASE ) 50 MCG/ACT nasal spray Place 1 spray into both nostrils daily as needed for allergies.  [provider]  GLUCOSAMINE-CHONDROITIN PO Take 1 tablet by mouth 2 (two) times daily.    [provider]  Magnesium 200 MG TABS Take 200 mg by mouth 2 (two) times daily.    [provider]  metroNIDAZOLE (METROGEL) 0.75 % gel Apply 1 application topically 2 (two) times daily.    [provider]  Multiple Vitamin (MULTIVITAMIN) tablet Take 1 tablet by mouth daily.    [provider]  nitroGLYCERIN  (NITROSTAT ) 0.4 MG SL tablet Place 0.4 mg under the tongue every 5 (five) minutes as needed for chest pain. 08/21/21   [provider]  Omega-3 Fatty Acids (FISH OIL) 1000 MG CAPS Take 1,000 mg by mouth daily.    [provider]  oxybutynin  (DITROPAN ) 5 MG tablet TAKE 1 TABLET BY MOUTH 3 TIMES A DAY AS NEEDED FOR  URINARY FREQUENCY AND BLADDER SPASMS 06/01/23   Stoioff, Kizzie Perks, MD  Probiotic Product (MEGA PROBIOTIC) CAPS Take 1 capsule by mouth in the morning and at bedtime. 07/20/19   [provider]  silodosin  (RAPAFLO ) 8 MG CAPS capsule TAKE 1 CAPSULE BY MOUTH DAILY WITH BREAKFAST 04/02/23   Stoioff, Scott C, MD  Vitamin A 2400 MCG (8000 UT) CAPS Take 8,000 Units by mouth daily.    [provider]  vitamin C (ASCORBIC ACID) 250 MG tablet Take 250 mg by mouth 2 (two) times daily.    [provider]  zinc gluconate 50 MG tablet Take 50 mg by mouth daily.    [provider]     Family History  Problem Relation Age of Onset   Prostate cancer Father        Metastatic   Cancer Father     Social History   Socioeconomic History   Marital status: Married    Spouse name: Devra Fontana   Number of children: Not on file   Years of education: Not on file   Highest education level: Not on file  Occupational History   Not on file  Tobacco Use   Smoking status: Former    Current packs/day: 0.00    Average packs/day: 0.8 packs/day for 60.0 years (45.0 ttl pk-yrs)    Types: Cigarettes    Start date: 67    Quit date: 2008    Years since quitting: 17.3    Passive exposure: Past   Smokeless tobacco: Never  Substance and Sexual Activity   Alcohol use: Yes    Alcohol/week: 2.0 standard drinks of alcohol    Types: 2 Standard drinks or equivalent per week    Comment: 14/week   Drug use: No   Sexual activity: Not on file  Other Topics Concern   Not on file  Social History Narrative   Not on file   Social Drivers of Health   Financial Resource Strain: Low Risk  (01/13/2023)   Received from [facility]   Overall Financial Resource Strain (CARDIA)    Difficulty of Paying Living Expenses: Not hard at all  Food Insecurity: No Food Insecurity (01/13/2023)   Received from [facility]   Hunger Vital Sign    Worried About Running Out of Food in the Last Year: Never  true    Ran Out of Food in the Last Year: Never true  Transportation Needs: No Transportation Needs (01/13/2023)   Received from [facility] - Transportation    Lack of Transportation (Medical): No    Lack of Transportation (Non-Medical): No  Physical Activity:  Not on file  Stress: Not on file  Social Connections: Not on file     Review of Systems: A 12 point ROS discussed and pertinent positives are indicated in the HPI above.  All other systems are negative.  Vital Signs: BP 129/68   Pulse 69   Temp 98.4 F (36.9 C) (Oral)   Resp 20   Ht 5\' 6"  (1.676 m)   Wt 143 lb (64.9 kg)   SpO2 97%   BMI 23.08 kg/m   Advance Care Plan: The advanced care place/surrogate decision maker was discussed at the time of visit and the patient did not wish to discuss or was not able to name a surrogate decision maker or provide an advance care plan.  Physical Exam Vitals reviewed.  Constitutional:      General: He is not in acute distress.    Appearance: Normal appearance.  HENT:     Mouth/Throat:     Mouth: Mucous membranes are dry.  Cardiovascular:     Rate and Rhythm: Normal rate and regular rhythm.     Pulses: Normal pulses.     Heart sounds: No murmur heard. Pulmonary:     Effort: Pulmonary effort is normal. No respiratory distress.     Breath sounds: Normal breath sounds.  Abdominal:     General: Abdomen is flat.     Palpations: Abdomen is soft.     Tenderness: There is abdominal tenderness.     Comments: Mild and generalized abdominal discomfort, worst at left side/LLQ  Musculoskeletal:        General: Normal range of motion.     Cervical back: Normal range of motion.  Skin:    General: Skin is warm and dry.  Neurological:     Mental Status: He is alert and oriented to person, place, and time.  Psychiatric:        Mood and Affect: Mood normal.        Behavior: Behavior normal.        Thought Content: Thought content normal.        Judgment: Judgment normal.      Imaging: DG OR UROLOGY CYSTO IMAGE ([facility] ONLY) Result Date: 05/26/2023 There is no interpretation for this exam.  This order is for images obtained during a surgical procedure.  Please See "Surgeries" Tab for more information regarding the procedure.    Labs:  CBC: Recent Labs    12/24/22 0431 12/30/22 1057 01/20/23 1041 05/05/23 1023 05/15/23 1120 05/25/23 1316 06/10/23 1033  WBC 9.6 7.6  --   --  9.0  --  11.0*  HGB 7.2* 7.2*   < > 9.0* 9.4* 9.1* 10.1*  HCT 22.1* 23.1*   < > 29.2* 30.6* 30.0* 33.1*  PLT 229 309  --   --  281  --  224   < > = values in this interval not displayed.    COAGS: Recent Labs    12/20/22 0352 06/10/23 1033  INR 1.2 1.1    BMP: Recent Labs    12/24/22 0431 12/30/22 1057 02/19/23 1324 04/16/23 1317 05/15/23 1120 06/03/23 1326 06/10/23 1033  NA 134* 135   < > 137 131* 138 137  K 4.5 4.7   < > CANCELED 5.0 4.8 4.8  CL 100 106   < > 107* 105 107* 112*  CO2 27 21*   < > 16* 18* 18* 19*  GLUCOSE 99 102*   < > 93 102* 97 94  BUN 72* 52*   < >  65* 72* 59* 57*  CALCIUM  8.3* 8.4*   < > 9.2 8.7* 8.5* 8.9  CREATININE 3.41* 2.81*   < > 3.59* 5.12* 3.15* 3.58*  GFRNONAA 18* 22*  --   --  11*  --  17*   < > = values in this interval not displayed.    LIVER FUNCTION TESTS: Recent Labs    12/16/22 1513 12/24/22 0431 12/30/22 1057 05/15/23 1120  BILITOT 0.7 0.7 0.5 0.7  AST 9* 23 21 14*  ALT 12 23 17 11   ALKPHOS 70 45 51 67  PROT 8.7* 6.7 7.2 8.8*  ALBUMIN 3.2* 2.7* 3.1* 3.5    TUMOR MARKERS: No results for input(s): "AFPTM", "CEA", "CA199", "CHROMGRNA" in the last 8760 hours.  Assessment and Plan: Per Dr. Mindi Alto Op note on 4/8: "[Patient has] a history of recurrent urothelial carcinoma the bladder and history of prostate cancer status post IMRT.  Noted late December 2024 to have AKI with RUS showing mild to moderate left hydronephrosis and mild to moderate right hydronephrosis.  Was also noted to have incomplete bladder  emptying.  Foley catheter was placed with improvement in his renal function and follow-up RUS with improvement in his hydronephrosis.  Repeat RUS 04/27/2023 with mild-moderate bilateral hydronephrosis worse from previous imaging and bilateral retrograde pyelogram with possible ureteral stent placement recommended."  Patient underwent right ureteral stent placement on 4/8. However, Dr. Cherylene Corrente was unable to place left ureteral stent. Therefore, Interventional Radiology was requested for left percutaneous nephrostomy tube placement. Patient is scheduled for same in IR today.  All labs and medications are within acceptable parameters. No pertinent allergies. Patient has been NPO since midnight. Patient last took his Asprin 5 days ago.  Patient presents for scheduled left PCN placement in IR today.  Risks and benefits of left PCN placement was discussed with the patient including, but not limited to, infection, bleeding, significant bleeding causing loss or decrease in renal function or damage to adjacent structures.   All of the patient's questions were answered, patient is agreeable to proceed.  Consent signed and in chart.      Thank you for allowing our service to participate in [name] 's care.  Electronically Signed: Lovena Rubinstein, PA-C   06/10/2023, 11:49 AM      I spent a total of 30 Minutes in face to face in clinical consultation, greater than 50% of which was counseling/coordinating care for bilateral hydronephrosis status post right ureteral stent placement, with consideration for left percutaneous nephrostomy tube placement.

## 2023-06-09 NOTE — Progress Notes (Addendum)
 Patient for IR LT Neph Tube Placement on Wed 06/10/23, I called and spoke with the patient on the phone and gave pre-procedure instructions. Pt was made aware to be here at 10:30a, last dose of ASA 81mg  was Thurs 06/04/23, NPO after MN prior to procedure as well as driver post procedure/recovery/discharge. Pt stated understanding.  Called 06/09/23

## 2023-06-10 ENCOUNTER — Encounter: Payer: Self-pay | Admitting: Interventional Radiology

## 2023-06-10 ENCOUNTER — Ambulatory Visit
Admission: RE | Admit: 2023-06-10 | Discharge: 2023-06-10 | Disposition: A | Discharge status: Home / Self Care | Source: Ambulatory Visit | Attending: Urology | Admitting: Urology

## 2023-06-10 ENCOUNTER — Encounter: Payer: Self-pay | Admitting: Radiology

## 2023-06-10 ENCOUNTER — Other Ambulatory Visit: Payer: Self-pay | Admitting: Urology

## 2023-06-10 ENCOUNTER — Other Ambulatory Visit: Payer: Self-pay | Admitting: Interventional Radiology

## 2023-06-10 DIAGNOSIS — E785 Hyperlipidemia, unspecified: Secondary | ICD-10-CM | POA: Diagnosis not present

## 2023-06-10 DIAGNOSIS — Z8546 Personal history of malignant neoplasm of prostate: Secondary | ICD-10-CM | POA: Insufficient documentation

## 2023-06-10 DIAGNOSIS — Z951 Presence of aortocoronary bypass graft: Secondary | ICD-10-CM | POA: Insufficient documentation

## 2023-06-10 DIAGNOSIS — D631 Anemia in chronic kidney disease: Secondary | ICD-10-CM | POA: Insufficient documentation

## 2023-06-10 DIAGNOSIS — N1330 Unspecified hydronephrosis: Secondary | ICD-10-CM

## 2023-06-10 DIAGNOSIS — Z8551 Personal history of malignant neoplasm of bladder: Secondary | ICD-10-CM | POA: Diagnosis not present

## 2023-06-10 DIAGNOSIS — I2510 Atherosclerotic heart disease of native coronary artery without angina pectoris: Secondary | ICD-10-CM | POA: Diagnosis not present

## 2023-06-10 DIAGNOSIS — I129 Hypertensive chronic kidney disease with stage 1 through stage 4 chronic kidney disease, or unspecified chronic kidney disease: Secondary | ICD-10-CM | POA: Diagnosis not present

## 2023-06-10 DIAGNOSIS — Z87891 Personal history of nicotine dependence: Secondary | ICD-10-CM | POA: Diagnosis not present

## 2023-06-10 DIAGNOSIS — N184 Chronic kidney disease, stage 4 (severe): Secondary | ICD-10-CM | POA: Diagnosis not present

## 2023-06-10 HISTORY — PX: IR RADIOLOGIST EVAL & MGMT: IMG5224

## 2023-06-10 HISTORY — PX: IR NEPHROSTOMY PLACEMENT LEFT: IMG6063

## 2023-06-10 LAB — BASIC METABOLIC PANEL WITH GFR
Anion gap: 6 (ref 5–15)
BUN: 57 mg/dL — ABNORMAL HIGH (ref 8–23)
CO2: 19 mmol/L — ABNORMAL LOW (ref 22–32)
Calcium: 8.9 mg/dL (ref 8.9–10.3)
Chloride: 112 mmol/L — ABNORMAL HIGH (ref 98–111)
Creatinine, Ser: 3.58 mg/dL — ABNORMAL HIGH (ref 0.61–1.24)
GFR, Estimated: 17 mL/min — ABNORMAL LOW (ref >60)
Glucose, Bld: 94 mg/dL (ref 70–99)
Potassium: 4.8 mmol/L (ref 3.5–5.1)
Sodium: 137 mmol/L (ref 135–145)

## 2023-06-10 LAB — CBC
HCT: 33.1 % — ABNORMAL LOW (ref 39.0–52.0)
Hemoglobin: 10.1 g/dL — ABNORMAL LOW (ref 13.0–17.0)
MCH: 32.4 pg (ref 26.0–34.0)
MCHC: 30.5 g/dL (ref 30.0–36.0)
MCV: 106.1 fL — ABNORMAL HIGH (ref 80.0–100.0)
Platelets: 224 10*3/uL (ref 150–400)
RBC: 3.12 MIL/uL — ABNORMAL LOW (ref 4.22–5.81)
RDW: 16.2 % — ABNORMAL HIGH (ref 11.5–15.5)
WBC: 11 10*3/uL — ABNORMAL HIGH (ref 4.0–10.5)
nRBC: 0 % (ref 0.0–0.2)

## 2023-06-10 LAB — PROTIME-INR
INR: 1.1 (ref 0.8–1.2)
Prothrombin Time: 14.2 s (ref 11.4–15.2)

## 2023-06-10 MED ORDER — LIDOCAINE HCL 1 % IJ SOLN
20.0000 mL | Freq: Once | INTRAMUSCULAR | Status: AC
  Administered 2023-06-10: 6 mL

## 2023-06-10 MED ORDER — SODIUM CHLORIDE 0.9 % IV SOLN
2.0000 g | INTRAVENOUS | Status: DC
  Filled 2023-06-10: qty 20

## 2023-06-10 MED ORDER — TRAMADOL HCL 25 MG PO TABS: 25.0000 mg | ORAL_TABLET | Freq: Two times a day (BID) | ORAL | 0 refills | Status: DC

## 2023-06-10 MED ORDER — LIDOCAINE HCL 1 % IJ SOLN
INTRAMUSCULAR | Status: AC
  Filled 2023-06-10: qty 20

## 2023-06-10 MED ORDER — SODIUM CHLORIDE 0.9 % IV SOLN
INTRAVENOUS | Status: AC | PRN
  Administered 2023-06-10: 2 g via INTRAVENOUS

## 2023-06-10 MED ORDER — IOHEXOL 300 MG/ML  SOLN
50.0000 mL | Freq: Once | INTRAMUSCULAR | Status: AC | PRN
  Administered 2023-06-10: 8 mL

## 2023-06-10 MED ORDER — FENTANYL CITRATE (PF) 100 MCG/2ML IJ SOLN
INTRAMUSCULAR | Status: AC
  Filled 2023-06-10: qty 2

## 2023-06-10 MED ORDER — FENTANYL CITRATE (PF) 100 MCG/2ML IJ SOLN
INTRAMUSCULAR | Status: AC | PRN
  Administered 2023-06-10 (×2): 50 ug via INTRAVENOUS

## 2023-06-10 MED ORDER — MIDAZOLAM HCL 2 MG/2ML IJ SOLN
INTRAMUSCULAR | Status: AC
  Filled 2023-06-10: qty 2

## 2023-06-10 MED ORDER — MIDAZOLAM HCL 2 MG/2ML IJ SOLN
INTRAMUSCULAR | Status: AC | PRN
  Administered 2023-06-10: 1 mg via INTRAVENOUS

## 2023-06-10 NOTE — Progress Notes (Signed)
 Patient clinically stable post IR Left Nephrostomy tube placement per Dr  Marne Sings, tolerated well. Received Versed  1 mg along with Fentanyl  100 mcg IV for procedure. Report given to Graydon Lazier RN post procedure./specials/

## 2023-06-10 NOTE — Procedures (Signed)
 Interventional Radiology Procedure Note  Procedure: Placement of a 15F LEFT percutaneous nephrostomy tube.   Complications: None  Estimated Blood Loss: None  Recommendations: - Bedrest x 1 hr - DC home   Signed,  Roxie Cord, MD

## 2023-06-12 ENCOUNTER — Other Ambulatory Visit: Payer: Self-pay

## 2023-06-12 DIAGNOSIS — N1330 Unspecified hydronephrosis: Secondary | ICD-10-CM

## 2023-06-12 DIAGNOSIS — N189 Chronic kidney disease, unspecified: Secondary | ICD-10-CM

## 2023-06-15 ENCOUNTER — Encounter: Payer: Self-pay | Admitting: Urology

## 2023-06-15 ENCOUNTER — Other Ambulatory Visit: Payer: Self-pay

## 2023-06-15 DIAGNOSIS — D631 Anemia in chronic kidney disease: Secondary | ICD-10-CM

## 2023-06-16 ENCOUNTER — Other Ambulatory Visit (HOSPITAL_COMMUNITY): Payer: Self-pay | Admitting: Radiology

## 2023-06-16 ENCOUNTER — Inpatient Hospital Stay

## 2023-06-16 ENCOUNTER — Other Ambulatory Visit

## 2023-06-16 VITALS — BP 137/62

## 2023-06-16 DIAGNOSIS — N184 Chronic kidney disease, stage 4 (severe): Secondary | ICD-10-CM

## 2023-06-16 DIAGNOSIS — N189 Chronic kidney disease, unspecified: Secondary | ICD-10-CM

## 2023-06-16 DIAGNOSIS — N1330 Unspecified hydronephrosis: Secondary | ICD-10-CM

## 2023-06-16 LAB — HEMOGLOBIN AND HEMATOCRIT (CANCER CENTER ONLY)
HCT: 30.3 % — ABNORMAL LOW (ref 39.0–52.0)
Hemoglobin: 9.4 g/dL — ABNORMAL LOW (ref 13.0–17.0)

## 2023-06-16 MED ORDER — EPOETIN ALFA-EPBX 40000 UNIT/ML IJ SOLN
40000.0000 [IU] | INTRAMUSCULAR | Status: DC
  Administered 2023-06-16: 40000 [IU] via SUBCUTANEOUS
  Filled 2023-06-16: qty 1

## 2023-06-16 NOTE — Progress Notes (Signed)
 Patient for IR LT Neph Tube convert to Nephroureteral cath on Wed 06/17/23, I called and spoke with the patient's wife, Devra Fontana on the phone and gave pre-procedure instructions. Devra Fontana was made aware to have the patient here at 8:30a, NPO after MN prior to procedure as well as driver post procedure/recovery/discharge. Devra Fontana stated understanding.  Called 06/16/23

## 2023-06-17 ENCOUNTER — Other Ambulatory Visit: Payer: Self-pay | Admitting: Urology

## 2023-06-17 ENCOUNTER — Encounter: Payer: Self-pay | Admitting: *Deleted

## 2023-06-17 ENCOUNTER — Other Ambulatory Visit: Payer: Self-pay

## 2023-06-17 ENCOUNTER — Encounter: Payer: Self-pay | Admitting: Radiology

## 2023-06-17 ENCOUNTER — Ambulatory Visit
Admission: RE | Admit: 2023-06-17 | Discharge: 2023-06-17 | Disposition: A | Discharge status: Home / Self Care | Source: Ambulatory Visit | Attending: Interventional Radiology | Admitting: Interventional Radiology

## 2023-06-17 DIAGNOSIS — Z8546 Personal history of malignant neoplasm of prostate: Secondary | ICD-10-CM | POA: Diagnosis not present

## 2023-06-17 DIAGNOSIS — Z860101 Personal history of adenomatous and serrated colon polyps: Secondary | ICD-10-CM | POA: Insufficient documentation

## 2023-06-17 DIAGNOSIS — N400 Enlarged prostate without lower urinary tract symptoms: Secondary | ICD-10-CM | POA: Diagnosis not present

## 2023-06-17 DIAGNOSIS — N1330 Unspecified hydronephrosis: Secondary | ICD-10-CM | POA: Insufficient documentation

## 2023-06-17 DIAGNOSIS — Z87891 Personal history of nicotine dependence: Secondary | ICD-10-CM | POA: Insufficient documentation

## 2023-06-17 DIAGNOSIS — Z8551 Personal history of malignant neoplasm of bladder: Secondary | ICD-10-CM | POA: Diagnosis not present

## 2023-06-17 HISTORY — PX: IR URETERAL STENT PLACEMENT EXISTING ACCESS LEFT: IMG6073

## 2023-06-17 LAB — BASIC METABOLIC PANEL WITH GFR
BUN/Creatinine Ratio: 15 (ref 10–24)
BUN: 41 mg/dL — ABNORMAL HIGH (ref 8–27)
CO2: 19 mmol/L — ABNORMAL LOW (ref 20–29)
Calcium: 9 mg/dL (ref 8.6–10.2)
Chloride: 109 mmol/L — ABNORMAL HIGH (ref 96–106)
Creatinine, Ser: 2.77 mg/dL — ABNORMAL HIGH (ref 0.76–1.27)
Glucose: 88 mg/dL (ref 70–99)
Potassium: 5.1 mmol/L (ref 3.5–5.2)
Sodium: 140 mmol/L (ref 134–144)
eGFR: 23 mL/min/{1.73_m2} — ABNORMAL LOW (ref >59)

## 2023-06-17 LAB — PROTIME-INR
INR: 1.2 (ref 0.8–1.2)
Prothrombin Time: 15.4 s — ABNORMAL HIGH (ref 11.4–15.2)

## 2023-06-17 LAB — CBC
HCT: 31.9 % — ABNORMAL LOW (ref 39.0–52.0)
Hemoglobin: 10.1 g/dL — ABNORMAL LOW (ref 13.0–17.0)
MCH: 32.3 pg (ref 26.0–34.0)
MCHC: 31.7 g/dL (ref 30.0–36.0)
MCV: 101.9 fL — ABNORMAL HIGH (ref 80.0–100.0)
Platelets: 192 10*3/uL (ref 150–400)
RBC: 3.13 MIL/uL — ABNORMAL LOW (ref 4.22–5.81)
RDW: 15.4 % (ref 11.5–15.5)
WBC: 5.5 10*3/uL (ref 4.0–10.5)
nRBC: 0 % (ref 0.0–0.2)

## 2023-06-17 MED ORDER — FENTANYL CITRATE (PF) 100 MCG/2ML IJ SOLN
INTRAMUSCULAR | Status: AC
  Filled 2023-06-17: qty 2

## 2023-06-17 MED ORDER — SODIUM CHLORIDE 0.9% FLUSH: 3.0000 mL | Freq: Two times a day (BID) | INTRAVENOUS | Status: DC

## 2023-06-17 MED ORDER — MIDAZOLAM HCL 2 MG/2ML IJ SOLN
INTRAMUSCULAR | Status: AC
  Filled 2023-06-17: qty 2

## 2023-06-17 MED ORDER — SODIUM CHLORIDE 0.9% FLUSH: 3.0000 mL | INTRAVENOUS | Status: DC | PRN

## 2023-06-17 MED ORDER — FENTANYL CITRATE (PF) 100 MCG/2ML IJ SOLN
INTRAMUSCULAR | Status: AC | PRN
  Administered 2023-06-17: 25 ug via INTRAVENOUS
  Administered 2023-06-17: 50 ug via INTRAVENOUS
  Administered 2023-06-17: 25 ug via INTRAVENOUS

## 2023-06-17 MED ORDER — IOHEXOL 300 MG/ML  SOLN
25.0000 mL | Freq: Once | INTRAMUSCULAR | Status: AC | PRN
  Administered 2023-06-17: 25 mL

## 2023-06-17 MED ORDER — LIDOCAINE HCL 1 % IJ SOLN
INTRAMUSCULAR | Status: AC
  Filled 2023-06-17: qty 20

## 2023-06-17 MED ORDER — MIDAZOLAM HCL 2 MG/2ML IJ SOLN
INTRAMUSCULAR | Status: AC | PRN
  Administered 2023-06-17: 1 mg via INTRAVENOUS
  Administered 2023-06-17 (×2): .5 mg via INTRAVENOUS

## 2023-06-17 MED ORDER — LIDOCAINE HCL 1 % IJ SOLN
1.0000 mL | Freq: Once | INTRAMUSCULAR | Status: AC
  Administered 2023-06-17: 1 mL via INTRADERMAL

## 2023-06-17 NOTE — H&P (Signed)
 Chief Complaint: Patient was seen in consultation today for left hydronephrosis   Procedure: Left nephrostomy to nephroureteral stent conversion  Referring Physician(s): Dr. Darlynn Elam  Supervising Physician: Fernando Hoyer  Patient Status: [facility] - Out-pt  History of Present Illness: [name] is a 78 y.o. male with a history of tubular adenoma of the colon, bilateral renal cysts, BPH, prostate cancer s/p IMRT and urothelial carcinoma of the bladder. Patient was noted to have AKI and mild to moderate bilateral hydronephrosis on renal US  in December 2024. A foley catheter was placed which temporarily improved renal function and hydronephrosis . However, on repeat renal US  on 04/27/23 worsening bilateral hydronephrosis was again noted. Patient underwent right ureteral stent placement and attempted left stent placement on 4/8 with Dr. Cherylene Corrente. Patient was subsequently referred to IR for left percutaneous nephrostomy tube placement which he obtained on 4/23 with Dr. Baldemar Lev.   Patient returns to [facility] IR today for conversion of left nephrostomy tube to internal nephroureteral stent. Patient has a lot of questions regarding the procedure for today; feels like things have not been communicated clearly for him. Discussed at length the procedure taking place today as well as the plan moving forward. Patient verbalized understanding and is agreeable to proceed.   Admits to some soreness in his left flank in the area of his drain. Admits to some difficulty with bag placement while sleeping and requests longer tubing, if possible, should the PCN need to stay in place. Otherwise without complaints. Denies any fevers, chills, N/V, abdominal pain, shortness of breath, or chest pain.   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 4v CABG on 04/13/2002. b.) LHC 02/03/2013 --> LVEF 50%; severe 3v CAD with patent LIMA-LAD and SVG-OM1 grafts; SVG-PDA and SVG-DG1 occluded with collaterals to PDA and DG1 from LAD.   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   a.) LIMA-LAD, SVG-OM1, SVG-D1, SVG-PDA   Smoldering multiple myeloma    Tubular adenoma of colon 02/03/2007    Past Surgical History:  Procedure Laterality Date   CARDIAC CATHETERIZATION Left 04/13/2002   Procedure: CARDIAC CATHETERIZATION; Location: [facility]; Surgeon; Percival Brace, MD   CARDIAC CATHETERIZATION Left 02/03/2013   Procedure: CARDIAC CATHETERIZATION; Location: [facility]; Surgeon: Percival Brace, MD   CATARACT EXTRACTION W/ INTRAOCULAR LENS IMPLANT Bilateral 2021   COLONOSCOPY W/ POLYPECTOMY  COLONOSCOPY WITH PROPOFOL  N/A 04/09/2015   Procedure: COLONOSCOPY WITH PROPOFOL ;  Surgeon: Stephens Eis, MD;  Location: [facility] ENDOSCOPY;  Service: Gastroenterology;  Laterality: N/A;   CORONARY ANGIOPLASTY     CORONARY ARTERY BYPASS GRAFT N/A 04/13/2002   Procedure: 4v CABG (LIMA-LAD, SVG-OM1, SVG-D1, SVG-PDA); Location: [facility]; Surgeon: Asencion Blacksmith, MD   CYSTOSCOPY N/A 07/17/2021    Procedure: Orin Birk;  Surgeon: Geraline Knapp, MD;  Location: [facility] ORS;  Service: Urology;  Laterality: N/A;   CYSTOSCOPY N/A 06/24/2022   Procedure: DIAGNOSTIC CYSTOSCOPY;  Surgeon: Geraline Knapp, MD;  Location: [facility] ORS;  Service: Urology;  Laterality: N/A;   CYSTOSCOPY W/ RETROGRADES Bilateral 05/26/2023   Procedure: CYSTOSCOPY, WITH RETROGRADE PYELOGRAM;  Surgeon: Geraline Knapp, MD;  Location: [facility] ORS;  Service: Urology;  Laterality: Bilateral;  right stent placement, bladder biopsy   CYSTOSCOPY WITH BIOPSY N/A 07/17/2021   Procedure: CYSTOSCOPY WITH BIOPSY;  Surgeon: Geraline Knapp, MD;  Location: [facility] ORS;  Service: Urology;  Laterality: N/A;   CYSTOSCOPY WITH BIOPSY N/A 06/24/2022   Procedure: CYSTOSCOPY WITH BLADDER BIOPSY;  Surgeon: Geraline Knapp, MD;  Location: [facility] ORS;  Service: Urology;  Laterality: N/A;   CYSTOSCOPY WITH FULGERATION N/A 07/17/2021   Procedure: CYSTOSCOPY WITH FULGERATION;  Surgeon: Geraline Knapp, MD;  Location: [facility] ORS;  Service: Urology;  Laterality: N/A;   CYSTOSCOPY WITH FULGERATION N/A 06/24/2022   Procedure: CYSTOSCOPY WITH FULGERATION;  Surgeon: Geraline Knapp, MD;  Location: [facility] ORS;  Service: Urology;  Laterality: N/A;   IR BONE MARROW BIOPSY & ASPIRATION  12/17/2022   IR NEPHROSTOMY PLACEMENT LEFT  06/10/2023   TONSILLECTOMY     TRANSURETHRAL RESECTION OF BLADDER TUMOR N/A 11/13/2020   Procedure: TRANSURETHRAL RESECTION OF BLADDER TUMOR (TURBT);  Surgeon: Geraline Knapp, MD;  Location: [facility] ORS;  Service: Urology;  Laterality: N/A;   TRANSURETHRAL RESECTION OF BLADDER TUMOR N/A 06/24/2022   Procedure: TRANSURETHRAL RESECTION OF BLADDER TUMOR (TURBT);  Surgeon: Geraline Knapp, MD;  Location: [facility] ORS;  Service: Urology;  Laterality: N/A;   TRANSURETHRAL RESECTION OF BLADDER TUMOR WITH GYRUS (TURBT-GYRUS)  06/24/2022   TRANSURETHRAL RESECTION OF PROSTATE      Allergies: Cidex [glutaral] and Other  Medications: Prior to  Admission medications   Medication Sig Start Date End Date Taking? Authorizing Provider  acetaminophen  (TYLENOL ) 500 MG tablet Take 500 mg by mouth every 6 (six) hours as needed for moderate pain (pain score 4-6).    [provider]  aspirin  81 MG tablet Take 81 mg by mouth daily.    [provider]  atorvastatin  (LIPITOR) 10 MG tablet Take 10 mg by mouth at bedtime. 04/29/22   [provider]  cefUROXime  (CEFTIN ) 250 MG tablet Take 1 tablet (250 mg total) by mouth 2 (two) times daily with a meal. 05/25/23   Stoioff, Kizzie Perks, MD  cetirizine (ZYRTEC) 10 MG tablet Take 10 mg by mouth daily as needed for allergies.    [provider]  Coenzyme Q10 100 MG capsule Take 100 mg by mouth daily.     [provider]  feeding supplement (ENSURE ENLIVE / ENSURE PLUS) LIQD Take 237 mLs by mouth 3 (three) times daily between meals. Patient taking differently: Take 237 mLs by mouth every other day. 12/25/22   Garrison Kanner, MD  finasteride  (PROSCAR ) 5 MG tablet Take 5 mg by mouth daily with lunch.    [provider]  fluticasone  (FLONASE ) 50 MCG/ACT nasal spray Place 1 spray into both  nostrils daily as needed for allergies.    [provider]  GLUCOSAMINE-CHONDROITIN PO Take 1 tablet by mouth 2 (two) times daily.    [provider]  Magnesium 200 MG TABS Take 200 mg by mouth 2 (two) times daily.    [provider]  metroNIDAZOLE (METROGEL) 0.75 % gel Apply 1 application topically 2 (two) times daily.    [provider]  Multiple Vitamin (MULTIVITAMIN) tablet Take 1 tablet by mouth daily.    [provider]  nitroGLYCERIN  (NITROSTAT ) 0.4 MG SL tablet Place 0.4 mg under the tongue every 5 (five) minutes as needed for chest pain. 08/21/21   [provider]  Omega-3 Fatty Acids (FISH OIL) 1000 MG CAPS Take 1,000 mg by mouth daily.    [provider]  oxybutynin  (DITROPAN ) 5 MG tablet TAKE 1 TABLET BY MOUTH 3  TIMES A DAY AS NEEDED FOR URINARY FREQUENCY AND BLADDER SPASMS 06/01/23   Stoioff, Kizzie Perks, MD  Probiotic Product (MEGA PROBIOTIC) CAPS Take 1 capsule by mouth in the morning and at bedtime. 07/20/19   [provider]  silodosin  (RAPAFLO ) 8 MG CAPS capsule TAKE 1 CAPSULE BY MOUTH DAILY WITH BREAKFAST 04/02/23   Stoioff, Kizzie Perks, MD  traMADol  HCl 25 MG TABS Take 25 mg by mouth every 12 (twelve) hours. 06/10/23   Carim, Gordy Lauber, PA-C  Vitamin A 2400 MCG (8000 UT) CAPS Take 8,000 Units by mouth daily.    [provider]  vitamin C (ASCORBIC ACID) 250 MG tablet Take 250 mg by mouth 2 (two) times daily.    [provider]  zinc gluconate 50 MG tablet Take 50 mg by mouth daily.    [provider]     Family History  Problem Relation Age of Onset   Prostate cancer Father        Metastatic   Cancer Father     Social History   Socioeconomic History   Marital status: Married    Spouse name: Devra Fontana   Number of children: Not on file   Years of education: Not on file   Highest education level: Not on file  Occupational History   Not on file  Tobacco Use   Smoking status: Former    Current packs/day: 0.00    Average packs/day: 0.8 packs/day for 60.0 years (45.0 ttl pk-yrs)    Types: Cigarettes    Start date: 27    Quit date: 2008    Years since quitting: 17.3    Passive exposure: Past   Smokeless tobacco: Never  Substance and Sexual Activity   Alcohol use: Yes    Alcohol/week: 2.0 standard drinks of alcohol    Types: 2 Standard drinks or equivalent per week    Comment: 14/week   Drug use: No   Sexual activity: Not on file  Other Topics Concern   Not on file  Social History Narrative   Not on file   Social Drivers of Health   Financial Resource Strain: Low Risk  (01/13/2023)   Received from [facility]   Overall Financial Resource Strain (CARDIA)    Difficulty of Paying Living Expenses: Not hard at all  Food Insecurity: No Food  Insecurity (01/13/2023)   Received from [facility]   Hunger Vital Sign    Worried About Running Out of Food in the Last Year: Never true    Ran Out of Food in the Last Year: Never true  Transportation Needs: No Transportation Needs (01/13/2023)  Received from [facility] - Transportation    Lack of Transportation (Medical): No    Lack of Transportation (Non-Medical): No  Physical Activity: Not on file  Stress: Not on file  Social Connections: Not on file    Review of Systems  Genitourinary:  Positive for flank pain (minimal soreness to left flank in the area of PCN).  Denies any N/V, chest pain, shortness of breath, fevers/chills. All other ROS negative.  Vital Signs: BP 132/76   Pulse 67   Temp 98 F (36.7 C) (Oral)   Resp 17   Ht 5\' 6"  (1.676 m)   Wt 149 lb 7.6 oz (67.8 kg)   SpO2 95%   BMI 24.13 kg/m    Physical Exam Vitals reviewed.  Constitutional:      Appearance: Normal appearance.  HENT:     Head: Normocephalic and atraumatic.     Mouth/Throat:     Mouth: Mucous membranes are moist.     Pharynx: Oropharynx is clear.  Cardiovascular:     Rate and Rhythm: Normal rate and regular rhythm.     Heart sounds: Normal heart sounds.  Pulmonary:     Effort: Pulmonary effort is normal.     Breath sounds: Normal breath sounds.  Abdominal:     General: Abdomen is flat.     Palpations: Abdomen is soft.     Tenderness: There is no abdominal tenderness. There is left CVA tenderness (minimal).  Musculoskeletal:        General: Normal range of motion.     Cervical back: Normal range of motion.  Skin:    General: Skin is warm and dry.  Neurological:     General: No focal deficit present.     Mental Status: He is alert and oriented to person, place, and time. Mental status is at baseline.  Psychiatric:        Mood and Affect: Mood normal.        Behavior: Behavior normal.        Judgment: Judgment normal.     Imaging: IR NEPHROSTOMY PLACEMENT  LEFT Result Date: 06/10/2023 INDICATION: History of bladder and prostate carcinoma with progressive bilateral hydronephrosis. Dr. Cherylene Corrente was able to be in place a right-sided double-J ureteral stent but the left ureteral orifice could not be identified. Therefore, patient presents today for placement of a left-sided percutaneous nephrostomy tube. EXAM: IR NEPHROSTOMY PLACEMENT LEFT COMPARISON:  None Available. MEDICATIONS: 1 g Rocephin ; The antibiotic was administered in an appropriate time frame prior to skin puncture. ANESTHESIA/SEDATION: Fentanyl  100 mcg IV; Versed  1 mg IV administered by the radiology nurse Moderate Sedation Time:  10 minutes The patient's vital signs and level of consciousness were continuously monitored during the procedure by the interventional radiology nurse under my direct supervision. CONTRAST:  8mL OMNIPAQUE  IOHEXOL  300 MG/ML SOLN - administered into the collecting system(s) FLUOROSCOPY: Radiation exposure index: 7.8 mGy reference air kerma COMPLICATIONS: None immediate. TECHNIQUE: The procedure, risks, benefits, and alternatives were explained to the patient. Questions regarding the procedure were encouraged and answered. The patient understands and consents to the procedure. The left flank was prepped with chlorhexidine  in a sterile fashion, and a sterile drape was applied covering the operative field. A sterile gown and sterile gloves were used for the procedure. Local anesthesia was provided with 1% Lidocaine . The left flank was interrogated with ultrasound and the left kidney identified. The kidney is hydronephrotic. A suitable access site on the skin overlying the lower pole,  posterior calix was identified. After local mg anesthesia was achieved, a small skin nick was made with an 11 blade scalpel. A 21 gauge Accustick needle was then advanced under direct sonographic guidance into the lower pole of the left kidney. A 0.018 inch wire was advanced under fluoroscopic guidance into  the left renal collecting system. The Accustick sheath was then advanced over the wire and a 0.018 system exchanged for a 0.035 system. Gentle hand injection of contrast material confirms placement of the sheath within the renal collecting system. There is moderate to severe hydronephrosis. The tract from the scan into the renal collecting system was then dilated serially to 10-French. A 10-French Cook all-purpose drain was then placed and positioned under fluoroscopic guidance. The locking loop is well formed within the left renal pelvis. The catheter was secured to the skin with 2-0 Prolene and a sterile bandage was placed. Catheter was left to gravity bag drainage. IMPRESSION: Successful placement of a left 10 French percutaneous nephrostomy tube. After discussion with Dr. Cherylene Corrente, we will bring the patient back to interventional Radiology in 1-2 weeks for attempted internalization and placement of a double-J ureteral stent. Electronically Signed   By: Fernando Hoyer M.D.   On: 06/10/2023 13:17   DG OR UROLOGY CYSTO IMAGE ([facility] ONLY) Result Date: 05/26/2023 There is no interpretation for this exam.  This order is for images obtained during a surgical procedure.  Please See "Surgeries" Tab for more information regarding the procedure.    Labs:  CBC: Recent Labs    12/30/22 1057 01/20/23 1041 05/15/23 1120 05/25/23 1316 06/10/23 1033 06/16/23 1258 06/17/23 0903  WBC 7.6  --  9.0  --  11.0*  --  5.5  HGB 7.2*   < > 9.4* 9.1* 10.1* 9.4* 10.1*  HCT 23.1*   < > 30.6* 30.0* 33.1* 30.3* 31.9*  PLT 309  --  281  --  224  --  192   < > = values in this interval not displayed.    COAGS: Recent Labs    12/20/22 0352 06/10/23 1033  INR 1.2 1.1    BMP: Recent Labs    12/24/22 0431 12/30/22 1057 02/19/23 1324 05/15/23 1120 06/03/23 1326 06/10/23 1033 06/16/23 1348  NA 134* 135   < > 131* 138 137 140  K 4.5 4.7   < > 5.0 4.8 4.8 5.1  CL 100 106   < > 105 107* 112* 109*  CO2 27 21*    < > 18* 18* 19* 19*  GLUCOSE 99 102*   < > 102* 97 94 88  BUN 72* 52*   < > 72* 59* 57* 41*  CALCIUM  8.3* 8.4*   < > 8.7* 8.5* 8.9 9.0  CREATININE 3.41* 2.81*   < > 5.12* 3.15* 3.58* 2.77*  GFRNONAA 18* 22*  --  11*  --  17*  --    < > = values in this interval not displayed.    LIVER FUNCTION TESTS: Recent Labs    12/16/22 1513 12/24/22 0431 12/30/22 1057 05/15/23 1120  BILITOT 0.7 0.7 0.5 0.7  AST 9* 23 21 14*  ALT 12 23 17 11   ALKPHOS 70 45 51 67  PROT 8.7* 6.7 7.2 8.8*  ALBUMIN 3.2* 2.7* 3.1* 3.5    TUMOR MARKERS: No results for input(s): "AFPTM", "CEA", "CA199", "CHROMGRNA" in the last 8760 hours.  Assessment and Plan:  Left hydronephrosis s/p left percutaneous nephrostomy: [name] is a 78 y.o. male with a  history of bilateral hydronephrosis s/p right ureteral stent placement and left percutaneous nephrostomy placement who presents to [facility] Interventional Radiology department for an image-guided conversion of left nephrostomy tube to internal nephroureteral stent with Dr. Baldemar Lev on 06/17/23. Procedure to be performed under moderate sedation.  Risks and benefits of PCN conversion to internal stent was discussed with the patient including, but not limited to, infection, bleeding, significant bleeding causing loss or decrease in renal function or damage to adjacent structures.   All of the patient's questions were answered, patient is agreeable to proceed.  Consent signed and in chart.   Thank you for this interesting consult. I greatly enjoyed meeting [name] and look forward to participating in their care. A copy of this report was sent to the requesting provider on this date.  Electronically Signed: Traivon Morrical M Tashawn Greff, PA-C 06/17/2023, 9:43 AM   I spent a total of 40 Minutes in face to face clinical consultation, greater than 50% of which was counseling/coordinating care for conversion of left nephrostomy tube to internal nephroureteral stent.

## 2023-06-22 ENCOUNTER — Telehealth: Payer: Self-pay | Admitting: *Deleted

## 2023-06-22 ENCOUNTER — Other Ambulatory Visit

## 2023-06-22 ENCOUNTER — Telehealth: Payer: Self-pay | Admitting: Urology

## 2023-06-22 ENCOUNTER — Encounter: Payer: Self-pay | Admitting: Urology

## 2023-06-22 ENCOUNTER — Other Ambulatory Visit: Payer: Self-pay | Admitting: *Deleted

## 2023-06-22 DIAGNOSIS — R3915 Urgency of urination: Secondary | ICD-10-CM

## 2023-06-22 LAB — URINALYSIS, COMPLETE

## 2023-06-22 NOTE — Telephone Encounter (Signed)
 Talked with patient after he sent a my chart message to Dr. Cherylene Corrente . Patient states he has been cathing every three hours as needed for pain control. He states the last time he cath was 1:00pm today  here when he giver us  a sample. He states he is not in any pain at this time. Its been about three and a half hours. He states he has had a couple of dime sides clots .I advised if he starts having pain if its after hours he can go to the er. He states he will cath if he needs to . He will call or send us  a message tomorrow morning to let us  know how he is doing.

## 2023-06-22 NOTE — Telephone Encounter (Signed)
 Pt came in today to give a urine sample fro UTI but wanted to let Dr. Cherylene Corrente know that he went to the urgent care fro his hand today and they put him on Doxycycline  and Prednisone. But he will start those after today.

## 2023-06-23 ENCOUNTER — Other Ambulatory Visit: Payer: Self-pay | Admitting: *Deleted

## 2023-06-23 ENCOUNTER — Encounter: Payer: Self-pay | Admitting: Urology

## 2023-06-23 LAB — URINALYSIS, COMPLETE: Specific Gravity, UA: 1.025 (ref 1.005–1.030)

## 2023-06-23 LAB — MICROSCOPIC EXAMINATION: RBC, Urine: >30 /HPF — AB (ref 0–2)

## 2023-06-23 MED ORDER — AMOXICILLIN 875 MG PO TABS: 875.0000 mg | ORAL_TABLET | Freq: Two times a day (BID) | ORAL | 0 refills | Status: DC

## 2023-06-23 NOTE — Telephone Encounter (Signed)
 Notified patient as instructed, patient pleased. Patient states the blood have stopped as of last night.

## 2023-06-26 ENCOUNTER — Encounter: Payer: Self-pay | Admitting: *Deleted

## 2023-06-26 LAB — CULTURE, URINE COMPREHENSIVE

## 2023-07-06 ENCOUNTER — Other Ambulatory Visit: Payer: Self-pay

## 2023-07-06 DIAGNOSIS — D631 Anemia in chronic kidney disease: Secondary | ICD-10-CM

## 2023-07-06 DIAGNOSIS — R768 Other specified abnormal immunological findings in serum: Secondary | ICD-10-CM

## 2023-07-07 ENCOUNTER — Inpatient Hospital Stay

## 2023-07-07 ENCOUNTER — Inpatient Hospital Stay: Attending: Radiation Oncology

## 2023-07-07 VITALS — BP 133/61

## 2023-07-07 DIAGNOSIS — D631 Anemia in chronic kidney disease: Secondary | ICD-10-CM

## 2023-07-07 DIAGNOSIS — R768 Other specified abnormal immunological findings in serum: Secondary | ICD-10-CM

## 2023-07-07 DIAGNOSIS — N184 Chronic kidney disease, stage 4 (severe): Secondary | ICD-10-CM | POA: Insufficient documentation

## 2023-07-07 LAB — HEMOGLOBIN AND HEMATOCRIT (CANCER CENTER ONLY)
HCT: 32.8 % — ABNORMAL LOW (ref 39.0–52.0)
Hemoglobin: 10.1 g/dL — ABNORMAL LOW (ref 13.0–17.0)

## 2023-07-07 MED ORDER — EPOETIN ALFA-EPBX 40000 UNIT/ML IJ SOLN
40000.0000 [IU] | INTRAMUSCULAR | Status: DC
  Administered 2023-07-07: 40000 [IU] via SUBCUTANEOUS
  Filled 2023-07-07: qty 1

## 2023-07-28 ENCOUNTER — Telehealth: Payer: Self-pay

## 2023-07-28 ENCOUNTER — Inpatient Hospital Stay

## 2023-07-28 ENCOUNTER — Inpatient Hospital Stay: Attending: Radiation Oncology

## 2023-07-28 DIAGNOSIS — R768 Other specified abnormal immunological findings in serum: Secondary | ICD-10-CM

## 2023-07-28 DIAGNOSIS — D631 Anemia in chronic kidney disease: Secondary | ICD-10-CM | POA: Insufficient documentation

## 2023-07-28 DIAGNOSIS — N184 Chronic kidney disease, stage 4 (severe): Secondary | ICD-10-CM | POA: Insufficient documentation

## 2023-07-28 LAB — CMP (CANCER CENTER ONLY)
ALT: 22 U/L (ref 0–44)
AST: 27 U/L (ref 15–41)
Albumin: 4 g/dL (ref 3.5–5.0)
Alkaline Phosphatase: 57 U/L (ref 38–126)
Anion gap: 6 (ref 5–15)
BUN: 59 mg/dL — ABNORMAL HIGH (ref 8–23)
CO2: 20 mmol/L — ABNORMAL LOW (ref 22–32)
Calcium: 8.7 mg/dL — ABNORMAL LOW (ref 8.9–10.3)
Chloride: 111 mmol/L (ref 98–111)
Creatinine: 3.51 mg/dL — ABNORMAL HIGH (ref 0.61–1.24)
GFR, Estimated: 17 mL/min — ABNORMAL LOW (ref >60)
Glucose, Bld: 89 mg/dL (ref 70–99)
Potassium: 5.2 mmol/L — ABNORMAL HIGH (ref 3.5–5.1)
Sodium: 137 mmol/L (ref 135–145)
Total Bilirubin: 0.7 mg/dL (ref 0.0–1.2)
Total Protein: 8 g/dL (ref 6.5–8.1)

## 2023-07-28 LAB — CBC WITH DIFFERENTIAL (CANCER CENTER ONLY)
Abs Immature Granulocytes: 0.01 10*3/uL (ref 0.00–0.07)
Basophils Absolute: 0.1 10*3/uL (ref 0.0–0.1)
Basophils Relative: 1 %
Eosinophils Absolute: 0.3 10*3/uL (ref 0.0–0.5)
Eosinophils Relative: 5 %
HCT: 35.5 % — ABNORMAL LOW (ref 39.0–52.0)
Hemoglobin: 10.8 g/dL — ABNORMAL LOW (ref 13.0–17.0)
Immature Granulocytes: 0 %
Lymphocytes Relative: 18 %
Lymphs Abs: 1.2 10*3/uL (ref 0.7–4.0)
MCH: 31.6 pg (ref 26.0–34.0)
MCHC: 30.4 g/dL (ref 30.0–36.0)
MCV: 103.8 fL — ABNORMAL HIGH (ref 80.0–100.0)
Monocytes Absolute: 0.5 10*3/uL (ref 0.1–1.0)
Monocytes Relative: 8 %
Neutro Abs: 4.5 10*3/uL (ref 1.7–7.7)
Neutrophils Relative %: 68 %
Platelet Count: 188 10*3/uL (ref 150–400)
RBC: 3.42 MIL/uL — ABNORMAL LOW (ref 4.22–5.81)
RDW: 14 % (ref 11.5–15.5)
WBC Count: 6.7 10*3/uL (ref 4.0–10.5)
nRBC: 0 % (ref 0.0–0.2)

## 2023-07-28 LAB — IRON AND TIBC
Iron: 91 ug/dL (ref 45–182)
Saturation Ratios: 28 % (ref 17.9–39.5)
TIBC: 330 ug/dL (ref 250–450)
UIBC: 239 ug/dL

## 2023-07-28 LAB — FERRITIN: Ferritin: 31 ng/mL (ref 24–336)

## 2023-07-28 NOTE — Telephone Encounter (Signed)
 Per Dr. Randy Buttery "can you ask him if he would like to get iv iron? his ferritin is low".

## 2023-07-28 NOTE — Progress Notes (Signed)
 Patient Hgb is 10.8. Patient does not need injection.

## 2023-07-29 ENCOUNTER — Telehealth: Payer: Self-pay | Admitting: *Deleted

## 2023-07-29 LAB — KAPPA/LAMBDA LIGHT CHAINS
Kappa free light chain: 24.8 mg/L — ABNORMAL HIGH (ref 3.3–19.4)
Kappa, lambda light chain ratio: 0.05 — ABNORMAL LOW (ref 0.26–1.65)
Lambda free light chains: 545.6 mg/L — ABNORMAL HIGH (ref 5.7–26.3)

## 2023-07-29 NOTE — Telephone Encounter (Signed)
 Patient called for [name] that has called him. He states that she can send me my chart if she wants to.  I spoke to [place] she will send my chart

## 2023-07-29 NOTE — Telephone Encounter (Addendum)
 Per my chart message received earlier today 07/29/23 at 1:08PM If Dr Randy Buttery thinks I need to get the IV, I am willing to have the Iron IV if there are no contraindications with regard to my kidney disease ?  I'm saying that because while I'm not sure  which of my doctors told me a couple of months ago (I think Dr Lamount Pimple, who is   my kidney DR) , to stop taking the 325 mg Iron Sulfate pills I had  been  taking at the suggestion of Dr. Marisa Sickles, my GP.     What do I need to do, and how many times do I need to do it ? Sorry I missed your message yesterday.   Arnetha Langton.    Outbound call; spoke to patient.  Confirmed there are no contraindications to receiving IV iron in the presence of kidney disease. Answered patient questions surrounding IV iron infusions.  Patient would like to come in for IV iron for 2 sessions (feraheme); informed depending on what the insurance approves will determine IV iron type.  Patient agreed to IV iron yet expressed concerns with possible scheduling conflicts.  Reassured patient if scheduling changes are needed we would be more than glad to accommodate.  Informed scheduling will be in touch shortly to coordinate; reminded to also check my chart for scheduled upcoming visits as well. Patient verbalized understanding.

## 2023-08-06 ENCOUNTER — Encounter: Payer: Self-pay | Admitting: Oncology

## 2023-08-07 ENCOUNTER — Other Ambulatory Visit: Payer: Self-pay | Admitting: Oncology

## 2023-08-11 ENCOUNTER — Ambulatory Visit

## 2023-08-12 ENCOUNTER — Inpatient Hospital Stay

## 2023-08-13 ENCOUNTER — Inpatient Hospital Stay

## 2023-08-13 VITALS — BP 142/69 | HR 55 | Temp 97.5°F | Resp 19

## 2023-08-13 DIAGNOSIS — D631 Anemia in chronic kidney disease: Secondary | ICD-10-CM

## 2023-08-13 DIAGNOSIS — N184 Chronic kidney disease, stage 4 (severe): Secondary | ICD-10-CM | POA: Diagnosis not present

## 2023-08-13 MED ORDER — SODIUM CHLORIDE 0.9 % IV SOLN
INTRAVENOUS | Status: DC
  Filled 2023-08-13: qty 250

## 2023-08-13 MED ORDER — SODIUM CHLORIDE 0.9 % IV SOLN
1000.0000 mg | Freq: Once | INTRAVENOUS | Status: AC
  Administered 2023-08-13: 1000 mg via INTRAVENOUS
  Filled 2023-08-13: qty 10

## 2023-08-14 ENCOUNTER — Other Ambulatory Visit

## 2023-08-18 ENCOUNTER — Inpatient Hospital Stay

## 2023-08-18 ENCOUNTER — Inpatient Hospital Stay: Attending: Radiation Oncology

## 2023-08-18 ENCOUNTER — Ambulatory Visit

## 2023-08-18 VITALS — BP 149/64

## 2023-08-18 DIAGNOSIS — D631 Anemia in chronic kidney disease: Secondary | ICD-10-CM | POA: Insufficient documentation

## 2023-08-18 DIAGNOSIS — N184 Chronic kidney disease, stage 4 (severe): Secondary | ICD-10-CM | POA: Diagnosis present

## 2023-08-18 DIAGNOSIS — R768 Other specified abnormal immunological findings in serum: Secondary | ICD-10-CM

## 2023-08-18 LAB — HEMOGLOBIN AND HEMATOCRIT (CANCER CENTER ONLY)
HCT: 32.6 % — ABNORMAL LOW (ref 39.0–52.0)
Hemoglobin: 10.1 g/dL — ABNORMAL LOW (ref 13.0–17.0)

## 2023-08-18 MED ORDER — EPOETIN ALFA-EPBX 40000 UNIT/ML IJ SOLN
40000.0000 [IU] | INTRAMUSCULAR | Status: DC
  Administered 2023-08-18: 40000 [IU] via SUBCUTANEOUS
  Filled 2023-08-18: qty 1

## 2023-08-26 ENCOUNTER — Encounter: Payer: Self-pay | Admitting: Oncology

## 2023-09-02 ENCOUNTER — Ambulatory Visit
Admission: RE | Admit: 2023-09-02 | Discharge: 2023-09-02 | Disposition: A | Discharge status: Home / Self Care | Source: Ambulatory Visit | Attending: Urology | Admitting: Urology

## 2023-09-02 ENCOUNTER — Ambulatory Visit: Admitting: Urology

## 2023-09-02 ENCOUNTER — Encounter: Payer: Self-pay | Admitting: Oncology

## 2023-09-02 VITALS — BP 119/60 | HR 59 | Ht 66.0 in | Wt 152.0 lb

## 2023-09-02 DIAGNOSIS — Z8546 Personal history of malignant neoplasm of prostate: Secondary | ICD-10-CM

## 2023-09-02 DIAGNOSIS — N1330 Unspecified hydronephrosis: Secondary | ICD-10-CM

## 2023-09-02 DIAGNOSIS — Z8551 Personal history of malignant neoplasm of bladder: Secondary | ICD-10-CM

## 2023-09-02 LAB — URINALYSIS, COMPLETE
Bilirubin, UA: NEGATIVE
Glucose, UA: NEGATIVE
Ketones, UA: NEGATIVE
Nitrite, UA: NEGATIVE
Specific Gravity, UA: 1.01 (ref 1.005–1.030)
Urobilinogen, Ur: 0.2 mg/dL (ref 0.2–1.0)
pH, UA: 6 (ref 5.0–7.5)

## 2023-09-02 LAB — MICROSCOPIC EXAMINATION: RBC, Urine: >30 /HPF — AB (ref 0–2)

## 2023-09-02 NOTE — Progress Notes (Signed)
 09/02/2023 3:00 PM   [name] [birth year] [id number]  Referring provider: Rudolpho Norleen BIRCH, MD [address] [facility] [address] [facility] [place],  [place] [address]  Chief Complaint  Patient presents with   Follow-up    Urological history: 1. Recurrent Ta urothelial carcinoma bladder TURBT July 2011; low-grade Ta TURBT December 2014; low-grade Ta; postresection mitomycin TURBT 07/2015; low-grade Ta; intravesical gemcitabine  x6 weeks TURBT 10/2020;Ta low-grade urothelial carcinoma with small focus suggestive of high-grade features; repeat intravesical gemcitabine  6 completed 01/07/2021 Bx w/ fulguration 07/17/2021; Ta low grade    2.  Intermediate risk adenocarcinoma prostate Treated IMRT+ ADT completed June 2020    3.  Radiation cystitis hyperbaric oxygen therapy  4.  Bilateral hydronephrosis AKI with creatinine 4.53 01/2023 RUS with bilateral hydronephrosis and incomplete bladder emptying Initially treated with Foley catheter drainage F/U ultrasound with persistent mild to moderate hydronephrosis Right retrograde pyelogram 05/26/26 with moderate hydronephrosis/hydroureter to the distal ureter with narrowing of the distal ureter.  Right ureteral stent placed.  The left ureteral orifice could not be identified and he subsequently underwent percutaneous nephrostomy placement April 2025 with subsequent internalization to a ureteral stent    HPI: [name] is a 78 y.o. male presents for 26-month follow-up.  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 4v CABG on 04/13/2002. b.) LHC 02/03/2013 --> LVEF 50%; severe 3v CAD with patent LIMA-LAD and SVG-OM1 grafts; SVG-PDA and SVG-DG1 occluded with collaterals to PDA and DG1 from LAD.   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   a.) LIMA-LAD, SVG-OM1, SVG-D1, SVG-PDA   Smoldering multiple myeloma    Tubular adenoma of colon 02/03/2007    Surgical History: Past Surgical History:  Procedure Laterality Date   CARDIAC CATHETERIZATION Left 04/13/2002   Procedure: CARDIAC CATHETERIZATION; Location: [facility]; Surgeon; Marsa Dooms, MD   CARDIAC CATHETERIZATION Left 02/03/2013   Procedure: CARDIAC CATHETERIZATION; Location: [facility]; Surgeon: Marsa Dooms, MD   CATARACT EXTRACTION W/ INTRAOCULAR LENS IMPLANT Bilateral 2021   COLONOSCOPY W/ POLYPECTOMY     COLONOSCOPY WITH PROPOFOL  N/A 04/09/2015   Procedure: COLONOSCOPY WITH PROPOFOL ;  Surgeon:  Deward CINDERELLA Piedmont, MD;  Location: [facility] ENDOSCOPY;  Service: Gastroenterology;  Laterality: N/A;   CORONARY ANGIOPLASTY     CORONARY ARTERY BYPASS GRAFT N/A 04/13/2002   Procedure: 4v CABG (LIMA-LAD, SVG-OM1, SVG-D1, SVG-PDA); Location: [facility]; Surgeon: Marget, MD    CYSTOSCOPY N/A 07/17/2021   Procedure: PHYLLIS;  Surgeon: Twylla Glendia BROCKS, MD;  Location: [facility] ORS;  Service: Urology;  Laterality: N/A;   CYSTOSCOPY N/A 06/24/2022   Procedure: DIAGNOSTIC CYSTOSCOPY;  Surgeon: Twylla Glendia BROCKS, MD;  Location: [facility] ORS;  Service: Urology;  Laterality: N/A;   CYSTOSCOPY W/ RETROGRADES Bilateral 05/26/2023   Procedure: CYSTOSCOPY, WITH RETROGRADE PYELOGRAM;  Surgeon: Twylla Glendia BROCKS, MD;  Location: [facility] ORS;  Service: Urology;  Laterality: Bilateral;  right stent placement, bladder biopsy   CYSTOSCOPY WITH BIOPSY N/A 07/17/2021   Procedure: CYSTOSCOPY WITH BIOPSY;  Surgeon: Twylla Glendia BROCKS, MD;  Location: [facility] ORS;  Service: Urology;  Laterality: N/A;   CYSTOSCOPY WITH BIOPSY N/A 06/24/2022   Procedure: CYSTOSCOPY WITH BLADDER BIOPSY;  Surgeon: Twylla Glendia BROCKS, MD;  Location: [facility] ORS;  Service: Urology;  Laterality: N/A;   CYSTOSCOPY WITH FULGERATION N/A 07/17/2021   Procedure: CYSTOSCOPY WITH FULGERATION;  Surgeon: Twylla Glendia BROCKS, MD;  Location: [facility] ORS;  Service: Urology;  Laterality: N/A;   CYSTOSCOPY WITH FULGERATION N/A 06/24/2022   Procedure: CYSTOSCOPY WITH FULGERATION;  Surgeon: Twylla Glendia BROCKS, MD;  Location: [facility] ORS;  Service: Urology;  Laterality: N/A;   IR BONE MARROW BIOPSY & ASPIRATION  12/17/2022   IR NEPHROSTOMY PLACEMENT LEFT  06/10/2023   IR RADIOLOGIST EVAL & MGMT  06/10/2023   IR URETERAL STENT PLACEMENT EXISTING ACCESS LEFT  06/17/2023   TONSILLECTOMY     TRANSURETHRAL RESECTION OF BLADDER TUMOR N/A 11/13/2020   Procedure: TRANSURETHRAL RESECTION OF BLADDER TUMOR (TURBT);  Surgeon: Twylla Glendia BROCKS, MD;  Location: [facility] ORS;  Service: Urology;  Laterality: N/A;   TRANSURETHRAL RESECTION OF BLADDER TUMOR N/A 06/24/2022   Procedure: TRANSURETHRAL RESECTION OF BLADDER TUMOR (TURBT);  Surgeon: Twylla Glendia BROCKS, MD;  Location: [facility] ORS;  Service: Urology;  Laterality: N/A;   TRANSURETHRAL RESECTION OF BLADDER TUMOR WITH GYRUS (TURBT-GYRUS)   06/24/2022   TRANSURETHRAL RESECTION OF PROSTATE      Home Medications:  Allergies as of 09/02/2023       Reactions   Cidex [glutaral] Anaphylaxis   Other Anaphylaxis   - Ortho-phthalaldehyde  - trospion cause AMS        Medication List        Accurate as of September 02, 2023  3:00 PM. If you have any questions, ask your nurse or doctor.          STOP taking these medications    amoxicillin  875 MG tablet Commonly known as: AMOXIL  Stopped by: Glendia BROCKS Twylla       TAKE these medications    acetaminophen  500 MG tablet Commonly known as: TYLENOL  Take 500 mg by mouth every 6 (six) hours as needed for moderate pain (pain score 4-6).   aspirin  81 MG tablet Take 81 mg by mouth daily.   atorvastatin  10 MG tablet Commonly known as: LIPITOR Take 10 mg by mouth at bedtime.   cetirizine 10 MG tablet Commonly known as: ZYRTEC Take 10 mg by mouth daily as needed for allergies.   Coenzyme Q10 100 MG capsule Take 100 mg by mouth daily.   feeding supplement Liqd Take 237 mLs by mouth 3 (three) times daily between meals. What changed: when to take this  finasteride  5 MG tablet Commonly known as: PROSCAR  Take 5 mg by mouth daily with lunch.   Fish Oil 1000 MG Caps Take 1,000 mg by mouth daily.   fluticasone  50 MCG/ACT nasal spray Commonly known as: FLONASE  Place 1 spray into both nostrils daily as needed for allergies.   GLUCOSAMINE-CHONDROITIN PO Take 1 tablet by mouth 2 (two) times daily.   Magnesium 200 MG Tabs Take 200 mg by mouth 2 (two) times daily.   Mega Probiotic Caps Take 1 capsule by mouth in the morning and at bedtime.   metroNIDAZOLE 0.75 % gel Commonly known as: METROGEL Apply 1 application topically 2 (two) times daily.   multivitamin tablet Take 1 tablet by mouth daily.   nitroGLYCERIN  0.4 MG SL tablet Commonly known as: NITROSTAT  Place 0.4 mg under the tongue every 5 (five) minutes as needed for chest pain.   oxybutynin  5 MG  tablet Commonly known as: DITROPAN  TAKE 1 TABLET BY MOUTH 3 TIMES A DAY AS NEEDED FOR URINARY FREQUENCY AND BLADDER SPASMS   silodosin  8 MG Caps capsule Commonly known as: RAPAFLO  TAKE 1 CAPSULE BY MOUTH DAILY WITH BREAKFAST   traMADol  HCl 25 MG Tabs Take 25 mg by mouth every 12 (twelve) hours.   Vitamin A 2400 MCG (8000 UT) Caps Take 8,000 Units by mouth daily.   vitamin C 250 MG tablet Commonly known as: ASCORBIC ACID Take 250 mg by mouth 2 (two) times daily.   zinc gluconate 50 MG tablet Take 50 mg by mouth daily.        Allergies:  Allergies  Allergen Reactions   Cidex [Glutaral] Anaphylaxis   Other Anaphylaxis    - Ortho-phthalaldehyde  - trospion cause AMS    Family History: Family History  Problem Relation Age of Onset   Prostate cancer Father        Metastatic   Cancer Father     Social History:  reports that he quit smoking about 17 years ago. His smoking use included cigarettes. He started smoking about 77 years ago. He has a 45 pack-year smoking history. He has been exposed to tobacco smoke. He has never used smokeless tobacco. He reports current alcohol use of about 2.0 standard drinks of alcohol per week. He reports that he does not use drugs.   Physical Exam: BP 119/60   Pulse (!) 59   Ht 5' 6 (1.676 m)   Wt 152 lb (68.9 kg)   BMI 24.53 kg/m   Constitutional:  Alert and oriented, No acute distress. HEENT: [place] AT Respiratory: Normal respiratory effort, no increased work of breathing. Psychiatric: Normal mood and affect.  Laboratory Data:  Urinalysis Dipstick 1+ protein/3+ blood/2+ leukocytes Microscopy 11-30 WBCs/>30 RBC    Assessment & Plan:    1. 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  If no encrustation noted will plan on cystoscopy with stent removal, bilateral retrograde pyelograms and stent replacement if persistent obstruction ~2-3  months  2.  History urothelial carcinoma bladder Due for surveillance cystoscopy ~3 months and can perform at time of stent removal/exchange  3.  Prostate cancer Due for PSA August 2025  4.  Urinary incontinence Feels he is benefiting from Kegel exercises.  We discussed pelvic floor physical therapy   Glendia JAYSON Barba, MD  [facility] [address], [address] [place], [place] [address] [phone]

## 2023-09-02 NOTE — Patient Instructions (Signed)
 Pelvic Floor Physical  therapy

## 2023-09-05 ENCOUNTER — Ambulatory Visit: Payer: Self-pay | Admitting: Urology

## 2023-09-06 ENCOUNTER — Encounter: Payer: Self-pay | Admitting: Urology

## 2023-09-08 ENCOUNTER — Inpatient Hospital Stay

## 2023-09-08 DIAGNOSIS — D631 Anemia in chronic kidney disease: Secondary | ICD-10-CM

## 2023-09-08 DIAGNOSIS — N184 Chronic kidney disease, stage 4 (severe): Secondary | ICD-10-CM | POA: Diagnosis not present

## 2023-09-08 DIAGNOSIS — R768 Other specified abnormal immunological findings in serum: Secondary | ICD-10-CM

## 2023-09-08 LAB — HEMOGLOBIN AND HEMATOCRIT (CANCER CENTER ONLY)
HCT: 36.4 % — ABNORMAL LOW (ref 39.0–52.0)
Hemoglobin: 11.5 g/dL — ABNORMAL LOW (ref 13.0–17.0)

## 2023-09-08 NOTE — Progress Notes (Signed)
 Hgb 11.5 today, no inj given

## 2023-09-28 ENCOUNTER — Encounter: Payer: Self-pay | Admitting: Urology

## 2023-09-29 ENCOUNTER — Inpatient Hospital Stay

## 2023-09-29 ENCOUNTER — Inpatient Hospital Stay: Attending: Radiation Oncology

## 2023-09-29 DIAGNOSIS — R768 Other specified abnormal immunological findings in serum: Secondary | ICD-10-CM

## 2023-09-29 DIAGNOSIS — N184 Chronic kidney disease, stage 4 (severe): Secondary | ICD-10-CM | POA: Insufficient documentation

## 2023-09-29 DIAGNOSIS — D631 Anemia in chronic kidney disease: Secondary | ICD-10-CM | POA: Diagnosis present

## 2023-09-29 LAB — HEMOGLOBIN AND HEMATOCRIT (CANCER CENTER ONLY)
HCT: 34.7 % — ABNORMAL LOW (ref 39.0–52.0)
Hemoglobin: 11.2 g/dL — ABNORMAL LOW (ref 13.0–17.0)

## 2023-09-29 NOTE — Progress Notes (Signed)
 Hgb 11.2 today, no inj given.

## 2023-10-20 ENCOUNTER — Inpatient Hospital Stay: Attending: Radiation Oncology

## 2023-10-20 ENCOUNTER — Inpatient Hospital Stay

## 2023-10-20 VITALS — BP 149/74

## 2023-10-20 DIAGNOSIS — D631 Anemia in chronic kidney disease: Secondary | ICD-10-CM | POA: Insufficient documentation

## 2023-10-20 DIAGNOSIS — R768 Other specified abnormal immunological findings in serum: Secondary | ICD-10-CM

## 2023-10-20 DIAGNOSIS — N184 Chronic kidney disease, stage 4 (severe): Secondary | ICD-10-CM | POA: Insufficient documentation

## 2023-10-20 LAB — HEMOGLOBIN AND HEMATOCRIT (CANCER CENTER ONLY)
HCT: 29.5 % — ABNORMAL LOW (ref 39.0–52.0)
Hemoglobin: 9.6 g/dL — ABNORMAL LOW (ref 13.0–17.0)

## 2023-10-20 MED ORDER — EPOETIN ALFA-EPBX 40000 UNIT/ML IJ SOLN
40000.0000 [IU] | INTRAMUSCULAR | Status: DC
  Administered 2023-10-20: 40000 [IU] via SUBCUTANEOUS
  Filled 2023-10-20: qty 1

## 2023-10-27 ENCOUNTER — Other Ambulatory Visit

## 2023-10-27 ENCOUNTER — Other Ambulatory Visit: Payer: Self-pay

## 2023-10-27 DIAGNOSIS — Z8546 Personal history of malignant neoplasm of prostate: Secondary | ICD-10-CM

## 2023-10-28 ENCOUNTER — Ambulatory Visit: Payer: Self-pay | Admitting: Urology

## 2023-10-28 LAB — BASIC METABOLIC PANEL WITH GFR
BUN/Creatinine Ratio: 13 (ref 10–24)
BUN: 38 mg/dL — ABNORMAL HIGH (ref 8–27)
CO2: 16 mmol/L — ABNORMAL LOW (ref 20–29)
Calcium: 9 mg/dL (ref 8.6–10.2)
Chloride: 109 mmol/L — ABNORMAL HIGH (ref 96–106)
Creatinine, Ser: 2.87 mg/dL — ABNORMAL HIGH (ref 0.76–1.27)
Glucose: 90 mg/dL (ref 70–99)
Potassium: 5.4 mmol/L — ABNORMAL HIGH (ref 3.5–5.2)
Sodium: 140 mmol/L (ref 134–144)
eGFR: 22 mL/min/1.73 — ABNORMAL LOW (ref >59)

## 2023-10-28 LAB — PSA: Prostate Specific Ag, Serum: <0.1 ng/mL (ref 0.0–4.0)

## 2023-10-30 ENCOUNTER — Other Ambulatory Visit: Payer: Self-pay | Admitting: Urology

## 2023-10-30 DIAGNOSIS — Z8551 Personal history of malignant neoplasm of bladder: Secondary | ICD-10-CM

## 2023-10-30 DIAGNOSIS — N1330 Unspecified hydronephrosis: Secondary | ICD-10-CM

## 2023-11-02 ENCOUNTER — Telehealth: Payer: Self-pay

## 2023-11-02 ENCOUNTER — Other Ambulatory Visit: Payer: Self-pay

## 2023-11-02 DIAGNOSIS — Z8551 Personal history of malignant neoplasm of bladder: Secondary | ICD-10-CM

## 2023-11-02 DIAGNOSIS — N135 Crossing vessel and stricture of ureter without hydronephrosis: Secondary | ICD-10-CM

## 2023-11-02 NOTE — Progress Notes (Signed)
  Phone Number: [phone] for Surgical Coordinator Fax Number: [phone]  REQUEST FOR SURGICAL CLEARANCE      Date: Date: 11/02/23  Faxed to: Dr. Ammon, MD  Surgeon: Dr. Glendia Barba, MD     Date of Surgery: 11/19/2023  Operation: Diagnostic Cystoscopy with Bilateral Retrograde Pyelograms, Possible Bladder Biopsy, Possible Transurethral Resection of Bladder Tumor, Possible Bilateral Ureteral Stent Removal versus Exchange   Anesthesia Type: General   Diagnosis: History of Bladder Cancer, Bilateral Ureteral Obstruction  Patient Requires:   Cardiac / Vascular Clearance : Yes  Reason: Would like for patient to hold ASA for 5 days prior to surgery.     Risk Assessment:    Low   []       Moderate   []     High   []           This patient is optimized for surgery  YES []       NO   []    I recommend further assessment/workup prior to surgery. YES []      NO  []   Appointment scheduled for: _______________________   Further recommendations: ____________________________________     Physician Signature:__________________________________   Printed Name: ________________________________________   Date: _________________

## 2023-11-02 NOTE — Telephone Encounter (Signed)
 Per Dr. Twylla, Patient is to be scheduled for Diagnostic Cystoscopy with Bilateral Retrograde Pyelograms, Possible Bladder Biopsy, Possible Transurethral Resection of Bladder Tumor, Possible Bilateral Ureteral Stent Removal versus Exchange   [name] was contacted and possible surgical dates were discussed, Thursday October 2nd, 2025 was agreed upon for surgery.   Patient was instructed that Dr. Twylla will require them to provide a pre-op UA & CX prior to surgery. This was ordered and scheduled drop off appointment was made for 11/11/2023.    Patient was directed to call [phone] between 1-3pm the day before surgery to find out surgical arrival time.  Instructions were given not to eat or drink from midnight on the night before surgery and have a driver for the day of surgery. On the surgery day patient was instructed to enter through the [facility] entrance of [facility] report the Same Day Surgery desk.   Pre-Admit Testing will be in contact via phone to set up an interview with the anesthesia team to review your history and medications prior to surgery.   Reminder of this information was sent via MyChart to the patient.

## 2023-11-02 NOTE — Progress Notes (Signed)
 Surgical Physician Order Form [facility]  Dr. Glendia Barba, MD  * Scheduling expectation : October 2025  *Length of Case: 30 minutes  *Clearance needed: Per anesthesia  *Anticoagulation Instructions: N/A  *Aspirin  Instructions: Hold Aspirin   *Post-op visit Date/Instructions: TBD  *Diagnosis: History bladder cancer; bilateral ureteral obstruction  *Procedure: Cystoscopy; possible bladder biopsy/TURBT; bilateral ureteral stent removal; bilateral retrograde pyelogram; possible bilateral ureteral stent exchange   Additional orders: N/A  -Admit type: OUTpatient  -Anesthesia: General  -VTE Prophylaxis Standing Order SCD's       Other:   -Standing Lab Orders Per Anesthesia    Lab other: UA&Urine Culture  -Standing Test orders EKG/Chest x-ray per Anesthesia       Test other:   - Medications: Will await preop urine culture results  -Other orders:  N/A

## 2023-11-02 NOTE — Progress Notes (Signed)
   [facility] Surgical Posting Form  Surgery Date: Date: 11/19/2023  Surgeon: Dr. Glendia Barba, MD  Inpt ( No  )   Outpt (Yes)   Obs ( No  )   Diagnosis: N13.5 Bilateral Ureteral Obstruction, Z85.51 History of Bladder Cancer  -CPT: 52000, 47795, 52234, 52310, 47667, [id number]  Surgery: Diagnostic Cystoscopy with Bilateral Retrograde Pyelograms, Possible Bladder Biopsy, Possible Transurethral Resection of Bladder Tumor, Possible Bilateral Ureteral Stent Removal versus Exchange   Stop Anticoagulations: Yes  Cardiac/Medical/Pulmonary Clearance needed: Yes  Clearance needed from Dr: Ammon  Clearance request sent on: Date: 11/02/23  *Orders entered into EPIC  Date: 11/02/23   *Case booked in EPIC  Date: 11/02/23  *Notified pt of Surgery: Date: 11/02/23  PRE-OP UA & CX: Yes, will obtain in clinic on 11/11/2023  *Placed into Prior Authorization Work [place] Date: 11/02/23  Assistant/laser/rep:No

## 2023-11-10 ENCOUNTER — Telehealth: Payer: Self-pay

## 2023-11-10 NOTE — Telephone Encounter (Signed)
 Received Surgical Clearance from Dr. Ammon. Patient is to hold Aspirin  for 5 days prior to surgery. Pt. Advised of this information. Last dosage on 11/13/2023.

## 2023-11-11 ENCOUNTER — Other Ambulatory Visit

## 2023-11-11 DIAGNOSIS — Z8551 Personal history of malignant neoplasm of bladder: Secondary | ICD-10-CM

## 2023-11-11 DIAGNOSIS — N135 Crossing vessel and stricture of ureter without hydronephrosis: Secondary | ICD-10-CM

## 2023-11-11 LAB — MICROSCOPIC EXAMINATION

## 2023-11-11 NOTE — Progress Notes (Signed)
 Established Patient Visit   Chief Complaint: Chief Complaint  Patient presents with  . Follow-up    4 month    Date of Service: 11/11/2023 Date of Birth: [birth year] PCP: Jake Norleen Lenis, MD  History of Present Illness: [name] is a 78 y.o.male patient who returns for   1.  CABG x4 04/13/2002  2.  Patent LIMA to LAD, SVG to OM1, occluded SVG to D1, PDA 02/03/2013  3.  Essential hypertension  4.  Hyperlipidemia  5.  Bladder cancer  6.  Prostate cancer status post XRT  7.  CKD stage IV  The patient returns today for a follow-up, reports doing better.  He denies exertional chest pain or shortness of breath. He denies palpitations or heart racing. He reports intermittent peripheral edema. He denies presyncope or syncope.  Patient is active, walks at home.    He was diagnosed with chronic kidney disease stage IV, followed by Dr. Douglas, with recent improvement.  ETT Myoview 09/22/2018 revealed LVEF of 45% with moderate inferior wall scar without ischemia.  This appears similar to prior ETT [facility] 04/10/2016.  The patient has essential hypertension, blood pressure in normal range, on metoprolol  tartrate which is tolerated well without apparent side effects.  The patient follows a low-sodium, no added salt diet.  The patient has mixed hyperlipidemia, LDL cholesterol 33 on 09/19/2022, on atorvastatin , which is well tolerated without apparent side effects, followed by his primary care provider.   Past Medical and Surgical History  Past Medical History Past Medical History:  Diagnosis Date  . Acquired cyst of kidney   . Allergic state    Cidex Disinfectant  . Bladder cancer (CMS/HHS-HCC)   . Cataract cortical, senile    [facility] is monitoring  . Coronary artery disease   . ED (erectile dysfunction)   . Encounter for blood transfusion    2004 during bypass operation  . Glaucoma (increased eye pressure)   . H/O cardiac catheterization   . Heart disease   . High cholesterol    . History of blood transfusion   . Hyperplastic colon polyp 01/03/2010  . Hypertension   . Rosacea   . Tubular adenoma of colon 02/03/2007   01/01/2004    Past Surgical History He has a past surgical history that includes TURBT; Coronary artery bypass graft (04/13/2002); Cardiac catheterization (02/03/2013); Colonoscopy (01/03/2010); Colonoscopy (04/09/2015); Transurethral Resection For Postoperative Bladder Neck Contracture; Prostate surgery; extraction cataract extracapsular w/insertion intraocular prosthesis (Left, 01/25/2019); extraction cataract extracapsular w/insertion intraocular prosthesis (Right, 02/15/2019); Colonoscopy (07/02/2021); and KIDNEY STENT  (Bilateral, 06/2023).   Medications and Allergies  Current Medications  Current Outpatient Medications  Medication Sig Dispense Refill  . aspirin  81 MG EC tablet Take 81 mg by mouth daily.    . atorvastatin  (LIPITOR) 10 MG tablet TAKE 1 TABLET DAILY 90 tablet 3  . CALCIUM -MAGNESIUM-ZINC ORAL Take by mouth once daily    . cholecalciferol (CHOLECALCIFEROL) 1000 unit tablet Take 1 tablet (1,000 Units total) by mouth once daily    . co-enzyme Q-10, ubiquinone, 30 mg capsule Take 100 mg by mouth once daily    . coenzyme Q10-vitamin E 100-5 mg-unit Cap Take 100 mg by mouth once daily    . fluticasone  propionate (FLONASE ) 50 mcg/actuation nasal spray Place 1 spray into one nostril as needed    . folic acid  (FOLVITE ) 1 MG tablet Take 1 mg by mouth    . FOLIC ACID /MULTIVITS-MIN (SPECTRAVITE ADULT ORAL) Take by mouth daily.    SABRA  FUROsemide  (LASIX ) 40 MG tablet Take 40 mg by mouth 2 (two) times daily    . GLUC HCL/GLUC SU/AC-D-GLUCOS (GLUCOSAMINE COMPLEX ORAL) Take 1,200 mg by mouth 2 (two) times daily.    SABRA krill-om-3-dha-epa-phospho-ast 1,000-170-50-80 mg Cap Take by mouth    . Lactobacillus combo no.23 (MEGA PROBIOTIC) 14 billion cell Cap Take 1 capsule by mouth once daily    . magnesium 200 mg Take 200 mg by mouth    . metoprolol   TARTrate (LOPRESSOR ) 25 MG tablet Take 1 tablet (25 mg total) by mouth 2 (two) times daily 180 tablet 2  . metroNIDAZOLE (METROGEL) 0.75 % gel Apply topically daily.    . multivitamin tablet Take 1 tablet by mouth once daily    . naproxen sodium (ALEVE, ANAPROX) 220 MG tablet Take 220 mg by mouth continuously as needed.    . nitroGLYcerin  (NITROSTAT ) 0.4 MG SL tablet PLACE 1 TAB UNDER THE TONGUE EVERY 5 MINUTES AS NEEDED FOR CHEST PAIN MAY TAKE UP TO 3 DOSES. 25 tablet 11  . omega-3 acid ethyl esters (LOVAZA) 1 gram capsule Take 1 g by mouth once daily    . omega-3 fatty acids/fish oil (FISH OIL) 340-1,000 mg capsule Take 1,000 mg by mouth once daily    . silodosin  (RAPAFLO ) 8 mg capsule Take 8 mg by mouth daily with breakfast    . VITAMIN A ORAL Take 10,500 Int'l Units by mouth daily.    SABRA VITAMIN B COMPLEX ORAL Take by mouth daily.    SABRA zinc gluconate 50 mg tablet Take 50 mg by mouth once daily    . finasteride  (PROSCAR ) 5 mg tablet Take 5 mg by mouth daily with lunch. (Patient not taking: Reported on 11/11/2023)    . sildenafiL (VIAGRA) 100 MG tablet Take 1 tablet (100 mg total) by mouth once daily as needed for Erectile Dysfunction for up to 40 days 10 tablet 3   No current facility-administered medications for this visit.    Allergies: Glutaraldehyde and Ortho-phthalaldehyde  Social and Family History  Social History  reports that he quit smoking about 17 years ago. His smoking use included cigarettes, pipe, and cigars. He started smoking about 63 years ago. He has a 46.6 pack-year smoking history. He has been exposed to tobacco smoke. He has never used smokeless tobacco. He reports current alcohol use of about 4.0 - 14.0 standard drinks of alcohol per week. He reports that he does not use drugs.  Family History Family History  Problem Relation Name Age of Onset  . Heart disease Father 52        CABG  . Prostate cancer Father 29        Father died at age 75 due to prostate cancer  the spread to his bones (per patient)  . Macular degeneration Father 23   . Coronary Artery Disease (Blocked arteries around heart) Father 2015   . High blood pressure (Hypertension) Father 88   . Cancer Mother 71   . Prostate cancer Mother 17        NOT TRUE  . Colon cancer Maternal Grandmother Not Sure        Grandmother  . Asthma Other         family hx  . Macular degeneration Brother         Brother has wet macular degeneration for which he receives regularly scheduled shots in his eyes (per patient)   . High blood pressure (Hypertension) Sister Shade Helling   . High  blood pressure (Hypertension) Brother Mark   . High blood pressure (Hypertension) Brother Richard   . Glaucoma Neg Hx      Review of Systems   Review of Systems: The patient denies chest pain, shortness of breath, orthopnea, paroxysmal nocturnal dyspnea, pedal edema, palpitations, heart racing, presyncope, syncope, with urinary symptoms. Review of 8 Systems is negative except as described above.  Physical Examination   Vitals: BP 126/78   Pulse 70   Ht 167.6 cm (5' 6)   Wt 74.4 kg (164 lb)   SpO2 98%   BMI 26.47 kg/m  Ht:167.6 cm (5' 6) Wt:74.4 kg (164 lb) ADJ:Anib surface area is 1.86 meters squared. Body mass index is 26.47 kg/m.  General: Alert and oriented. Well-appearing. No acute distress. HEENT: Pupils equally reactive to light and accomodation    Neck: Supple, no JVD Lungs: Normal effort of breathing; clear to auscultation bilaterally; no wheezes, rales, rhonchi Heart: Regular rate and rhythm. No murmur, rub, or gallop Abdomen: nondistended, with normal bowel sounds Extremities: no cyanosis, clubbing, or edema Peripheral Pulses: 2+ radial bilaterally Skin: Warm, dry, no diaphoresis  Assessment   78 y.o. male with  1. Coronary artery disease involving native heart without angina pectoris, unspecified vessel or lesion type   2. History of cardiac catheterization   3. Hx of CABG   4.  Primary hypertension   5. Hyperlipidemia, mixed    78 year old gentleman with known coronary disease, status post CABG, without exertional chest pain.  ETT sestamibi study 04/11/2016 revealed mildly reduced left ventricular function, with moderate inferior scar without ischemia, which was unchanged from previous study.  The patient has essential hypertension, blood pressure in normal range off BP medications.  The patient has hyperlipidemia, with excellent control of LDL cholesterol on atorvastatin .   Plan   1.  Continue current medications 2.  Counseled patient about low-sodium diet 3.  DASH diet printed instructions given to the patient 4.  Counseled patient about low-cholesterol diet 5.  Continue atorvastatin  for hyperlipidemia management 6.  Low-fat and cholesterol diet printed instructions given to the patient 7.  Return to clinic for follow-up in 4 months    No orders of the defined types were placed in this encounter.   Return in about 4 months (around 03/12/2024).   MARSA DOOMS, MD PhD [facility]

## 2023-11-12 ENCOUNTER — Encounter
Admission: RE | Admit: 2023-11-12 | Discharge: 2023-11-12 | Disposition: A | Discharge status: Home / Self Care | Source: Ambulatory Visit | Attending: Urology | Admitting: Urology

## 2023-11-12 ENCOUNTER — Other Ambulatory Visit: Payer: Self-pay

## 2023-11-12 VITALS — Wt 164.0 lb

## 2023-11-12 DIAGNOSIS — Z01810 Encounter for preprocedural cardiovascular examination: Secondary | ICD-10-CM

## 2023-11-12 DIAGNOSIS — Z8551 Personal history of malignant neoplasm of bladder: Secondary | ICD-10-CM

## 2023-11-12 DIAGNOSIS — I2510 Atherosclerotic heart disease of native coronary artery without angina pectoris: Secondary | ICD-10-CM

## 2023-11-12 DIAGNOSIS — I10 Essential (primary) hypertension: Secondary | ICD-10-CM

## 2023-11-12 DIAGNOSIS — Z01812 Encounter for preprocedural laboratory examination: Secondary | ICD-10-CM

## 2023-11-12 DIAGNOSIS — D631 Anemia in chronic kidney disease: Secondary | ICD-10-CM

## 2023-11-12 LAB — MICROSCOPIC EXAMINATION: RBC, Urine: >30 /HPF — AB (ref 0–2)

## 2023-11-12 LAB — URINALYSIS, COMPLETE
Bilirubin, UA: NEGATIVE
Glucose, UA: NEGATIVE
Ketones, UA: NEGATIVE
Nitrite, UA: NEGATIVE
Specific Gravity, UA: 1.01 (ref 1.005–1.030)
Urobilinogen, Ur: 0.2 mg/dL (ref 0.2–1.0)
pH, UA: 6 (ref 5.0–7.5)

## 2023-11-12 NOTE — Patient Instructions (Addendum)
 Your procedure is scheduled on:11-19-23 Thursday Report to the Registration Desk on the 1st floor of the [facility].Then proceed to the 2nd floor Surgery Desk To find out your arrival time, please call [phone] between 1PM - 3PM on:11-18-23 Wednesday If your arrival time is 6:00 am, do not arrive before that time as the [facility] entrance doors do not open until 6:00 am.  REMEMBER: Instructions that are not followed completely may result in serious medical risk, up to and including death; or upon the discretion of your surgeon and anesthesiologist your surgery may need to be rescheduled.  Do not eat food OR drink liquids after midnight the night before surgery.  No gum chewing or hard candies.  One week prior to surgery:Stop NOW (11-12-23) Stop Anti-inflammatories (NSAIDS) such as Advil, Aleve, Ibuprofen, Motrin, Naproxen, Naprosyn and Aspirin  based products such as Excedrin, Goody's Powder, BC Powder. Stop ANY OVER THE COUNTER supplements until after surgery (Vitamin C, CoQ10, Glucosamine-Chondroitin, Magnesium, Multivitamin, Fish Oil, Probiotic, Vitamin A, Zinc)  You may however, continue to take Tylenol  if needed for pain up until the day of surgery.  Stop your 81 mg Aspirin  5 days prior to surgery-Last dose will be on 11-13-23 Friday  Continue taking all of your other prescription medications up until the day of surgery.  ON THE DAY OF SURGERY ONLY TAKE THESE MEDICATIONS WITH SIPS OF WATER : -metoprolol  tartrate (LOPRESSOR )  -silodosin  (RAPAFLO )   No Alcohol for 24 hours before or after surgery.  No Smoking including e-cigarettes for 24 hours before surgery.  No chewable tobacco products for at least 6 hours before surgery.  No nicotine patches on the day of surgery.  Do not use any recreational drugs for at least a week (preferably 2 weeks) before your surgery.  Please be advised that the combination of cocaine and anesthesia may have negative outcomes, up to and  including death. If you test positive for cocaine, your surgery will be cancelled.  On the morning of surgery brush your teeth with toothpaste and water , you may rinse your mouth with mouthwash if you wish. Do not swallow any toothpaste or mouthwash.  Do not wear jewelry, make-up, hairpins, clips or nail polish.  For welded (permanent) jewelry: bracelets, anklets, waist bands, etc.  Please have this removed prior to surgery.  If it is not removed, there is a chance that hospital personnel will need to cut it off on the day of surgery.  Do not wear lotions, powders, or perfumes.   Do not shave body hair from the neck down 48 hours before surgery.  Contact lenses, hearing aids and dentures may not be worn into surgery.  Do not bring valuables to the hospital. [facility] is not responsible for any missing/lost belongings or valuables.   Notify your doctor if there is any change in your medical condition (cold, fever, infection).  Wear comfortable clothing (specific to your surgery type) to the hospital.  After surgery, you can help prevent lung complications by doing breathing exercises.  Take deep breaths and cough every 1-2 hours. Your doctor may order a device called an Incentive Spirometer to help you take deep breaths. When coughing or sneezing, hold a pillow firmly against your incision with both hands. This is called "splinting." Doing this helps protect your incision. It also decreases belly discomfort.  If you are being admitted to the hospital overnight, leave your suitcase in the car. After surgery it may be brought to your room.  In case of increased  patient census, it may be necessary for you, the patient, to continue your postoperative care in the Same Day Surgery department.  If you are being discharged the day of surgery, you will not be allowed to drive home. You will need a responsible individual to drive you home and stay with you for 24 hours after surgery.   If you  are taking public transportation, you will need to have a responsible individual with you.  Please call the Pre-admissions Testing Dept. at [phone] if you have any questions about these instructions.  Surgery Visitation Policy:  Patients having surgery or a procedure may have two visitors.  Children under the age of 62 must have an adult with them who is not the patient.   Merchandiser, retail to address health-related social needs:  [email or web address]

## 2023-11-13 ENCOUNTER — Encounter: Payer: Self-pay | Admitting: Urgent Care

## 2023-11-13 ENCOUNTER — Encounter
Admission: RE | Admit: 2023-11-13 | Discharge: 2023-11-13 | Disposition: A | Discharge status: Home / Self Care | Source: Ambulatory Visit | Attending: Urology | Admitting: Urology

## 2023-11-13 DIAGNOSIS — Z01812 Encounter for preprocedural laboratory examination: Secondary | ICD-10-CM

## 2023-11-13 DIAGNOSIS — I452 Bifascicular block: Secondary | ICD-10-CM | POA: Insufficient documentation

## 2023-11-13 DIAGNOSIS — I129 Hypertensive chronic kidney disease with stage 1 through stage 4 chronic kidney disease, or unspecified chronic kidney disease: Secondary | ICD-10-CM | POA: Diagnosis not present

## 2023-11-13 DIAGNOSIS — I10 Essential (primary) hypertension: Secondary | ICD-10-CM

## 2023-11-13 DIAGNOSIS — I2510 Atherosclerotic heart disease of native coronary artery without angina pectoris: Secondary | ICD-10-CM | POA: Insufficient documentation

## 2023-11-13 DIAGNOSIS — Z01810 Encounter for preprocedural cardiovascular examination: Secondary | ICD-10-CM

## 2023-11-13 DIAGNOSIS — D631 Anemia in chronic kidney disease: Secondary | ICD-10-CM | POA: Diagnosis not present

## 2023-11-13 DIAGNOSIS — N189 Chronic kidney disease, unspecified: Secondary | ICD-10-CM | POA: Insufficient documentation

## 2023-11-13 DIAGNOSIS — Z01818 Encounter for other preprocedural examination: Secondary | ICD-10-CM | POA: Insufficient documentation

## 2023-11-13 LAB — BASIC METABOLIC PANEL WITH GFR
Anion gap: 7 (ref 5–15)
BUN: 48 mg/dL — ABNORMAL HIGH (ref 8–23)
CO2: 24 mmol/L (ref 22–32)
Calcium: 8.9 mg/dL (ref 8.9–10.3)
Chloride: 110 mmol/L (ref 98–111)
Creatinine, Ser: 3.07 mg/dL — ABNORMAL HIGH (ref 0.61–1.24)
GFR, Estimated: 20 mL/min — ABNORMAL LOW (ref >60)
Glucose, Bld: 95 mg/dL (ref 70–99)
Potassium: 4.9 mmol/L (ref 3.5–5.1)
Sodium: 141 mmol/L (ref 135–145)

## 2023-11-13 LAB — CBC
HCT: 34.9 % — ABNORMAL LOW (ref 39.0–52.0)
Hemoglobin: 11.3 g/dL — ABNORMAL LOW (ref 13.0–17.0)
MCH: 34.6 pg — ABNORMAL HIGH (ref 26.0–34.0)
MCHC: 32.4 g/dL (ref 30.0–36.0)
MCV: 106.7 fL — ABNORMAL HIGH (ref 80.0–100.0)
Platelets: 167 K/uL (ref 150–400)
RBC: 3.27 MIL/uL — ABNORMAL LOW (ref 4.22–5.81)
RDW: 14 % (ref 11.5–15.5)
WBC: 7.3 K/uL (ref 4.0–10.5)
nRBC: 0 % (ref 0.0–0.2)

## 2023-11-15 ENCOUNTER — Encounter: Payer: Self-pay | Admitting: Urology

## 2023-11-16 ENCOUNTER — Ambulatory Visit: Admitting: Oncology

## 2023-11-16 ENCOUNTER — Inpatient Hospital Stay: Admission: RE | Admit: 2023-11-16 | Source: Ambulatory Visit

## 2023-11-16 ENCOUNTER — Other Ambulatory Visit

## 2023-11-16 ENCOUNTER — Encounter: Payer: Self-pay | Admitting: Urology

## 2023-11-16 ENCOUNTER — Other Ambulatory Visit: Payer: Self-pay | Admitting: *Deleted

## 2023-11-16 MED ORDER — FINASTERIDE 5 MG PO TABS: 5.0000 mg | ORAL_TABLET | Freq: Every day | ORAL | 0 refills | Status: AC

## 2023-11-17 ENCOUNTER — Encounter: Payer: Self-pay | Admitting: Nurse Practitioner

## 2023-11-17 ENCOUNTER — Inpatient Hospital Stay

## 2023-11-17 ENCOUNTER — Encounter: Payer: Self-pay | Admitting: Urology

## 2023-11-17 ENCOUNTER — Inpatient Hospital Stay: Admitting: Nurse Practitioner

## 2023-11-17 VITALS — BP 136/63 | HR 50 | Temp 97.4°F | Resp 16 | Ht 65.0 in | Wt 163.6 lb

## 2023-11-17 DIAGNOSIS — D472 Monoclonal gammopathy: Secondary | ICD-10-CM

## 2023-11-17 DIAGNOSIS — D631 Anemia in chronic kidney disease: Secondary | ICD-10-CM

## 2023-11-17 DIAGNOSIS — N184 Chronic kidney disease, stage 4 (severe): Secondary | ICD-10-CM

## 2023-11-17 DIAGNOSIS — R7689 Other specified abnormal immunological findings in serum: Secondary | ICD-10-CM

## 2023-11-17 LAB — CBC WITH DIFFERENTIAL (CANCER CENTER ONLY)
Abs Immature Granulocytes: 0.02 K/uL (ref 0.00–0.07)
Basophils Absolute: 0.1 K/uL (ref 0.0–0.1)
Basophils Relative: 1 %
Eosinophils Absolute: 0.5 K/uL (ref 0.0–0.5)
Eosinophils Relative: 7 %
HCT: 35.4 % — ABNORMAL LOW (ref 39.0–52.0)
Hemoglobin: 11.3 g/dL — ABNORMAL LOW (ref 13.0–17.0)
Immature Granulocytes: 0 %
Lymphocytes Relative: 14 %
Lymphs Abs: 1 K/uL (ref 0.7–4.0)
MCH: 34.5 pg — ABNORMAL HIGH (ref 26.0–34.0)
MCHC: 31.9 g/dL (ref 30.0–36.0)
MCV: 107.9 fL — ABNORMAL HIGH (ref 80.0–100.0)
Monocytes Absolute: 0.6 K/uL (ref 0.1–1.0)
Monocytes Relative: 8 %
Neutro Abs: 5 K/uL (ref 1.7–7.7)
Neutrophils Relative %: 70 %
Platelet Count: 153 K/uL (ref 150–400)
RBC: 3.28 MIL/uL — ABNORMAL LOW (ref 4.22–5.81)
RDW: 13.7 % (ref 11.5–15.5)
WBC Count: 7.2 K/uL (ref 4.0–10.5)
nRBC: 0 % (ref 0.0–0.2)

## 2023-11-17 LAB — CULTURE, URINE COMPREHENSIVE

## 2023-11-17 LAB — CMP (CANCER CENTER ONLY)
ALT: 17 U/L (ref 0–44)
AST: 20 U/L (ref 15–41)
Albumin: 4 g/dL (ref 3.5–5.0)
Alkaline Phosphatase: 59 U/L (ref 38–126)
Anion gap: 7 (ref 5–15)
BUN: 49 mg/dL — ABNORMAL HIGH (ref 8–23)
CO2: 19 mmol/L — ABNORMAL LOW (ref 22–32)
Calcium: 8.8 mg/dL — ABNORMAL LOW (ref 8.9–10.3)
Chloride: 112 mmol/L — ABNORMAL HIGH (ref 98–111)
Creatinine: 3.14 mg/dL — ABNORMAL HIGH (ref 0.61–1.24)
GFR, Estimated: 19 mL/min — ABNORMAL LOW (ref >60)
Glucose, Bld: 92 mg/dL (ref 70–99)
Potassium: 4.4 mmol/L (ref 3.5–5.1)
Sodium: 138 mmol/L (ref 135–145)
Total Bilirubin: 1.1 mg/dL (ref 0.0–1.2)
Total Protein: 7.4 g/dL (ref 6.5–8.1)

## 2023-11-17 LAB — IRON AND TIBC
Iron: 77 ug/dL (ref 45–182)
Saturation Ratios: 29 % (ref 17.9–39.5)
TIBC: 266 ug/dL (ref 250–450)
UIBC: 189 ug/dL

## 2023-11-17 LAB — FERRITIN: Ferritin: 93 ng/mL (ref 24–336)

## 2023-11-17 NOTE — Progress Notes (Signed)
 Hematology/Oncology Consult Note [facility]  Telephone:([phone] [phone]  Patient Care Team: Rudolpho Norleen BIRCH, MD as PCP - General (Internal Medicine) Lenn Aran, MD as Radiation Oncologist (Radiation Oncology) Melanee Annah BROCKS, MD as Consulting Physician (Oncology)   Name of the patient: [name]  [id number]  05/19/1945   Date of visit: 11/17/23  Diagnosis- 1.  Smoldering multiple myeloma 2.  History of superficial bladder cancer 3. Anemia of chronic kidney disease  Chief complaint/ Reason for visit-routine follow-up of anemia  Heme/Onc history: Patient is a 78 year old male with history of superficial bladder cancer was last seen by me in November 2022. At that time he completed 6 weekly cycles of intravesical gemcitabine  chemotherapy and was subsequently followed up by urology. He has been having concerns of radiation cystitis for which she has been undergoing hyperbaric oxygen therapy. He followed up with Dr. Douglas from nephrology for CKD when he was found to have a creatinine of 2.3. At that time he was found to have a creatinine of 2.9 with an H&H of 10.2/31.2. Possible M protein noted on SPEP and labs also showed elevated lambda light chain of 403 with a free light chain ratio of 0.11    Patient was hospitalized on 12/16/2022 after he was found to have acute kidney injury with a creatinine of 9.  Given the concern for multiple myeloma patient underwent PET scan as an inpatient which does not show any evidence of lytic lesions.He underwent ultrasound renal which showed bilateral hydronephrosis left greater than right which was chronic as compared to February 2024 and therefore urology did not feel that any nephrostomy tube or stent was warranted.  Patient was given IV fluids and Foley catheter was placed with significant improvement in his renal functions.   Bone marrow biopsy on10/30/2024 showed lambda restricted plasma cell neoplasm  involving 15% of the marrow.  Karyotype was normal.  Myeloma FISH panel showed gain/duplication of 1 q. overall clinical picture was thought to be anemia secondary to chronic kidney disease but the cause of chronic kidney disease was not attributed to myeloma but rather secondary to underlying bladder issues and potential obstructive uropathy.  Patient also went for second opinion to [facility] and this was agreed upon as well.  He is presently on EPO for his anemia    Interval history- [name] is a 78 y.o. male who returns to clinic for follow up. He plans to undergo cystoscopy and TURBT with stent placement with Dr. Twylla. Feels at baseline and denies complaints. Is exercising and becoming more active. Back on treadmill. Denies any neurologic complaints. Denies recent fevers or illnesses. Denies any easy bleeding or bruising. No melena or hematochezia. No pica or restless leg. Reports good appetite and denies weight loss. Denies chest pain. Denies any nausea, vomiting, constipation, or diarrhea. Denies urinary complaints. Patient offers no further specific complaints today.  ECOG PS- 2 Pain scale- 0  Review of systems- Review of Systems  Constitutional:  Positive for malaise/fatigue. Negative for chills, fever and weight loss.  HENT:  Negative for congestion, ear discharge and nosebleeds.   Eyes:  Negative for blurred vision.  Respiratory:  Negative for cough, hemoptysis, sputum production, shortness of breath and wheezing.   Cardiovascular:  Negative for chest pain, palpitations, orthopnea and claudication.  Gastrointestinal:  Negative for abdominal pain, blood in stool, constipation, diarrhea, heartburn, melena, nausea and vomiting.  Genitourinary:  Negative for dysuria, flank pain, frequency, hematuria and urgency.  Musculoskeletal:  Negative  for back pain, joint pain and myalgias.  Skin:  Negative for rash.  Neurological:  Negative for dizziness, tingling, focal weakness, seizures,  weakness and headaches.  Endo/Heme/Allergies:  Does not bruise/bleed easily.  Psychiatric/Behavioral:  Negative for depression and suicidal ideas. The patient does not have insomnia.     Allergies  Allergen Reactions   Cidex [Glutaral] Anaphylaxis   Other Anaphylaxis    - Ortho-phthalaldehyde  - trospion (Sanctura ) cause AMS   Past Medical History:  Diagnosis Date   Anemia in stage 4 chronic kidney disease (HCC)    Aneurysm of infrarenal abdominal aorta    a.) CTA AP 07/28/2019: measured 3.0 cm   Aneurysm of left common iliac artery    a.) CTA AP 07/28/2019: measured 1.6 cm   Aneurysm of right common carotid artery    a.) CTA AP 07/28/2019: measured 2.1 cm   Anginal pain    Aortic atherosclerosis    Benign prostatic hyperplasia    Bifascicular block (RBBB + LAFB)    Bilateral renal cysts    Bladder cancer (HCC)    Chronic kidney disease, stage IV (severe) (HCC)    Coronary artery disease    a.) LHC 04/12/2002 --> LVEF 42%; severe 3v CAD. 4v CABG on 04/13/2002. b.) LHC 02/03/2013 --> LVEF 50%; severe 3v CAD with patent LIMA-LAD and SVG-OM1 grafts; SVG-PDA and SVG-DG1 occluded with collaterals to PDA and DG1 from LAD.   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   a.) LIMA-LAD, SVG-OM1, SVG-D1, SVG-PDA   Smoldering multiple myeloma    Tubular adenoma of colon 02/03/2007   Past Surgical History:  Procedure Laterality Date   CARDIAC CATHETERIZATION Left 04/13/2002   Procedure: CARDIAC CATHETERIZATION; Location: [facility]; Surgeon; Marsa Dooms, MD   CARDIAC CATHETERIZATION Left 02/03/2013   Procedure: CARDIAC CATHETERIZATION; Location: [facility]; Surgeon: Marsa Dooms, MD    CATARACT EXTRACTION W/ INTRAOCULAR LENS IMPLANT Bilateral 2021   COLONOSCOPY W/ POLYPECTOMY     COLONOSCOPY WITH PROPOFOL  N/A 04/09/2015   Procedure: COLONOSCOPY WITH PROPOFOL ;  Surgeon: Deward CINDERELLA Piedmont, MD;  Location: [facility] ENDOSCOPY;  Service: Gastroenterology;  Laterality: N/A;   CORONARY ANGIOPLASTY     CORONARY ARTERY BYPASS GRAFT N/A 04/13/2002   Procedure: 4v CABG (LIMA-LAD, SVG-OM1, SVG-D1, SVG-PDA); Location: [facility]; Surgeon: Marget, MD   CYSTOSCOPY N/A 07/17/2021   Procedure: PHYLLIS;  Surgeon: Twylla Glendia BROCKS, MD;  Location: [facility] ORS;  Service: Urology;  Laterality: N/A;   CYSTOSCOPY N/A 06/24/2022   Procedure: DIAGNOSTIC CYSTOSCOPY;  Surgeon: Twylla Glendia BROCKS, MD;  Location: [facility] ORS;  Service: Urology;  Laterality: N/A;   CYSTOSCOPY W/ RETROGRADES Bilateral 05/26/2023   Procedure: CYSTOSCOPY, WITH RETROGRADE PYELOGRAM;  Surgeon: Twylla Glendia BROCKS, MD;  Location: [facility] ORS;  Service: Urology;  Laterality: Bilateral;  right stent placement, bladder biopsy   CYSTOSCOPY WITH BIOPSY N/A 07/17/2021   Procedure: CYSTOSCOPY WITH BIOPSY;  Surgeon: Twylla Glendia BROCKS, MD;  Location: [facility] ORS;  Service: Urology;  Laterality: N/A;   CYSTOSCOPY WITH BIOPSY N/A 06/24/2022   Procedure: CYSTOSCOPY WITH BLADDER BIOPSY;  Surgeon: Twylla Glendia BROCKS, MD;  Location: [facility];  Service: Urology;  Laterality: N/A;   CYSTOSCOPY WITH FULGERATION N/A  07/17/2021   Procedure: CYSTOSCOPY WITH FULGERATION;  Surgeon: Twylla Glendia BROCKS, MD;  Location: [facility];  Service: Urology;  Laterality: N/A;   CYSTOSCOPY WITH FULGERATION N/A 06/24/2022   Procedure: CYSTOSCOPY WITH FULGERATION;  Surgeon: Twylla Glendia BROCKS, MD;  Location: [facility];  Service: Urology;  Laterality: N/A;   IR BONE MARROW BIOPSY & ASPIRATION  12/17/2022   IR NEPHROSTOMY PLACEMENT LEFT  06/10/2023   IR RADIOLOGIST EVAL & MGMT  06/10/2023   IR URETERAL STENT PLACEMENT EXISTING ACCESS LEFT  06/17/2023   TONSILLECTOMY     TRANSURETHRAL RESECTION OF BLADDER TUMOR  N/A 11/13/2020   Procedure: TRANSURETHRAL RESECTION OF BLADDER TUMOR (TURBT);  Surgeon: Twylla Glendia BROCKS, MD;  Location: [facility];  Service: Urology;  Laterality: N/A;   TRANSURETHRAL RESECTION OF BLADDER TUMOR N/A 06/24/2022   Procedure: TRANSURETHRAL RESECTION OF BLADDER TUMOR (TURBT);  Surgeon: Twylla Glendia BROCKS, MD;  Location: [facility] OR[facility];  Service: Urology;  Laterality: N/A;   TRANSURETHRAL RESECTION OF BLADDER TUMOR WITH GYRUS (TURBT-GYRUS)  06/24/2022   TRANSURETHRAL RESECTION OF PROSTATE     Social History   Socioeconomic History   Marital status: Married    Spouse name: Avelina   Number of children: Not on file   Years of education: Not on file   Highest education level: Not on file  Occupational History   Not on file  Tobacco Use   Smoking status: Former    Current packs/day: 0.00    Average packs/day: 0.8 packs/day for 60.0 years (45.0 ttl pk-yrs)    Types: Cigarettes    Start date: 76    Quit date: 2008    Years since quitting: 17.7    Passive exposure: Past   Smokeless tobacco: Never  Substance and Sexual Activity   Alcohol use: Yes    Alcohol/week: 2.0 standard drinks of alcohol    Types: 2 Standard drinks or equivalent per week    Comment: 1 glass wine every night   Drug use: No   Sexual activity: Not on file  Other Topics Concern   Not on file  Social History Narrative   Not on file   Social Drivers of Health   Financial Resource Strain: Low Risk  (10/12/2023)   Received from [facility]   Overall Financial Resource Strain (CARDIA)    Difficulty of Paying Living Expenses: Not hard at all  Food Insecurity: No Food Insecurity (10/12/2023)   Received from [facility]   Hunger Vital Sign    Within the past 12 months, you worried that your food would run out before you got the money to buy more.: Never true    Within the past 12 months, the food you bought just didn't last and you didn't have money to get more.: Never true   Transportation Needs: No Transportation Needs (10/12/2023)   Received from [facility] - Transportation    In the past 12 months, has lack of transportation kept you from medical appointments or from getting medications?: No    Lack of Transportation (Non-Medical): No  Physical Activity: Not on file  Stress: Not on file  Social Connections: Not on file  Intimate Partner Violence: Not At Risk (12/16/2022)   Humiliation, Afraid, Rape, and Kick questionnaire    Fear of Current or Ex-Partner: No    Emotionally Abused: No    Physically Abused: No    Sexually Abused: No   Family History  Problem Relation Age of Onset  Prostate cancer Father        Metastatic   Cancer Father     Current Outpatient Medications:    acetaminophen  (TYLENOL ) 500 MG tablet, Take 500 mg by mouth every 6 (six) hours as needed for moderate pain (pain score 4-6)., Disp: , Rfl:    ascorbic acid (VITAMIN C) 500 MG tablet, Take 500 mg by mouth daily., Disp: , Rfl:    aspirin  81 MG tablet, Take 81 mg by mouth daily., Disp: , Rfl:    atorvastatin  (LIPITOR) 10 MG tablet, Take 10 mg by mouth at bedtime., Disp: , Rfl:    cetirizine (ZYRTEC) 10 MG tablet, Take 10 mg by mouth daily as needed for allergies., Disp: , Rfl:    Coenzyme Q10 100 MG capsule, Take 100 mg by mouth daily. , Disp: , Rfl:    feeding supplement (ENSURE ENLIVE / ENSURE PLUS) LIQD, Take 237 mLs by mouth 3 (three) times daily between meals. (Patient taking differently: Take 118 mLs by mouth daily.), Disp: , Rfl:    finasteride  (PROSCAR ) 5 MG tablet, Take 1 tablet (5 mg total) by mouth daily., Disp: 90 tablet, Rfl: 0   fluticasone  (FLONASE ) 50 MCG/ACT nasal spray, Place 1 spray into both nostrils daily as needed for allergies., Disp: , Rfl:    GLUCOSAMINE-CHONDROITIN PO, Take 1 tablet by mouth 2 (two) times daily., Disp: , Rfl:    Magnesium 200 MG TABS, Take 200 mg by mouth 2 (two) times daily., Disp: , Rfl:    metoprolol   tartrate (LOPRESSOR ) 25 MG tablet, Take 25 mg by mouth 2 (two) times daily., Disp: , Rfl:    metroNIDAZOLE (METROGEL) 1 % gel, Apply 1 Application topically 2 (two) times daily., Disp: , Rfl:    Multiple Vitamin (MULTIVITAMIN) tablet, Take 1 tablet by mouth daily., Disp: , Rfl:    nitroGLYCERIN  (NITROSTAT ) 0.4 MG SL tablet, Place 0.4 mg under the tongue every 5 (five) minutes as needed for chest pain., Disp: , Rfl:    Omega-3 Fatty Acids (FISH OIL) 1000 MG CAPS, Take 1,000 mg by mouth daily., Disp: , Rfl:    Probiotic Product (MEGA PROBIOTIC) CAPS, Take 1 capsule by mouth daily., Disp: , Rfl:    silodosin  (RAPAFLO ) 8 MG CAPS capsule, TAKE 1 CAPSULE BY MOUTH DAILY WITH BREAKFAST, Disp: 30 capsule, Rfl: 11   Vitamin A 2400 MCG (8000 UT) CAPS, Take 8,000 Units by mouth daily., Disp: , Rfl:    zinc gluconate 50 MG tablet, Take 50 mg by mouth daily., Disp: , Rfl:   Physical exam:  Vitals:   11/17/23 1328  BP: 136/63  Pulse: (!) 50  Resp: 16  Temp: (!) 97.4 F (36.3 C)  TempSrc: Tympanic  SpO2: 98%  Weight: 163 lb 9.6 oz (74.2 kg)  Height: 5' 5 (1.651 m)    Physical Exam Cardiovascular:     Rate and Rhythm: Normal rate and regular rhythm.     Heart sounds: Normal heart sounds.  Pulmonary:     Effort: Pulmonary effort is normal.     Breath sounds: Normal breath sounds.  Musculoskeletal:     Right lower leg: No edema.     Left lower leg: No edema.  Skin:    General: Skin is warm and dry.  Neurological:     Mental Status: He is alert and oriented to person, place, and time.        Latest Ref Rng & Units 11/13/2023    2:21 PM  CMP  Glucose 70 -  99 mg/dL 95   BUN 8 - 23 mg/dL 48   Creatinine 9.38 - 1.24 mg/dL 6.92   Sodium 864 - 854 mmol/L 141   Potassium 3.5 - 5.1 mmol/L 4.9   Chloride 98 - 111 mmol/L 110   CO2 22 - 32 mmol/L 24   Calcium  8.9 - 10.3 mg/dL 8.9       Latest Ref Rng & Units 11/13/2023    2:21 PM  CBC  WBC 4.0 - 10.5 K/uL 7.3   Hemoglobin 13.0 - 17.0 g/dL  88.6   Hematocrit 60.9 - 52.0 % 34.9   Platelets 150 - 400 K/uL 167    Iron/TIBC/Ferritin/ %Sat    Component Value Date/Time   IRON 77 11/17/2023 1253   TIBC 266 11/17/2023 1253   FERRITIN 93 11/17/2023 1253   IRONPCTSAT 29 11/17/2023 1253   No results found.    Assessment and plan- Patient is a 78 y.o. male who is here for follow-up of following issues:  Anemia of chronic kidney disease: Last received retacrit  10/20/23. Received monoferric  08/13/23. Goal is to maintain hemoglobin 10-11. Recommend optimizing iron stores in setting of CKD. Goal iron sat > 20% and ferritin > 100. Ferritin and iron stores are pending at time of visit. Hmg 11.3 which is improved. He does not require retacrit  today. Plan to reevaluate every 3 weeks with labs. Plan to repeat iron stores and ferritin every 3 months. We will see him back in 6 months.  Smoldering multiple myeloma: Bone marrow biopsy in the recent past showed 15% clonal plasma cells. Myeloma labs from today are pending.  Although his free light chain ratio was abnormal at 0.09, he had a negative PET scan and still considering this smoldering myeloma. He also had second opinion at [facility] which supported smoldering myeloma vs active myeloma. He is being followed by urology for worsening renal function with obstructive uropathy and has plans to undergo cystoscopy soon.  Disposition:  Every 3 weeks- lab (H&H), +/- retacrit  3 mo- lab 6 mo- lab Week later- see Dr Melanee    Visit Diagnosis 1. Anemia in stage 4 chronic kidney disease (HCC)   2. Smoldering multiple myeloma    Tinnie Dawn, DNP, AGNP-C, [facility] [phone] (clinic) 11/17/2023

## 2023-11-17 NOTE — Progress Notes (Signed)
 Perioperative / Anesthesia Services  Pre-Admission Testing Clinical Review / Pre-Operative Anesthesia Consult  Date: 11/17/23  PATIENT DEMOGRAPHICS: Name: [name] DOB: 01/21/1946 MRN:   [id number]  Note: Available PAT nursing documentation and vital signs have been reviewed. Clinical nursing staff has updated patient's PMH/PSHx, current medication list, and drug allergies/intolerances to ensure complete and comprehensive history available to assist care teams in MDM as it pertains to the aforementioned surgical procedure and anticipated anesthetic course. Extensive review of available clinical information personally performed. [place] PMH and PSHx updated with any diagnoses/procedures that  may have been inadvertently omitted during his intake with the pre-admission testing department's nursing staff.  PLANNED SURGICAL PROCEDURE(S):   Case: [id number] Date/Time: 11/19/23 0914   Procedures:      CYSTOSCOPY - Diagnostic     CYSTOSCOPY, WITH BIOPSY - Bladder     TURBT (TRANSURETHRAL RESECTION OF BLADDER TUMOR)     CYSTOSCOPY, WITH RETROGRADE PYELOGRAM (Bilateral)     REMOVAL, STENT, URETER, CYSTOSCOPIC (Bilateral)     CYSTOSCOPY, FLEXIBLE, WITH STENT REPLACEMENT (Bilateral)   Anesthesia type: General   Diagnosis:      History of bladder cancer [Z85.51]     Bilateral ureteral obstruction [N13.5]   Pre-op diagnosis: History of Bladder Cancer, Bilateral Ureteral Obstruction   Location: [facility] OR ROOM 10 / [facility] ORS FOR ANESTHESIA GROUP   Surgeons: Twylla Glendia BROCKS, MD        CLINICAL DISCUSSION: [name] is a 78 y.o. male who is submitted for pre-surgical anesthesia review and clearance prior to him undergoing the above procedure. Patient is a Former Smoker (45 pack years; quit 02/2006). Pertinent PMH includes: CAD (s/p CABG), infrarenal AAA, BILATERAL common iliac artery aneurysms, bifascicular block (RBBB + LAFB), angina, aortic atherosclerosis, HTN, HLD, CKD-IV,  smoldering multiple myeloma, bladder tumor, BPH, prostate cancer, ED, anemia, prostate cancer.   Patient is followed by cardiology Andrea, MD). He was last seen in the cardiology clinic on 11/11/2023; notes reviewed. At the time of his clinic visit, patient doing well overall from a cardiovascular perspective.  Patient reporting intermittent episodes of peripheral edema.  Patient denied any chest pain, shortness of breath, PND, orthopnea, palpitations, weakness, fatigue, vertiginous symptoms, or presyncope/syncope. Patient with a past medical history significant for cardiovascular diagnoses. Documented physical exam was grossly benign, providing no evidence of acute exacerbation and/or decompensation of the patient's known cardiovascular conditions.  Patient underwent diagnostic left heart catheterization on 04/13/2002 revealed an LVEF of 42%.  Study demonstrated multivessel CAD; 100% proximal RCA, 50% distal RCA, 80% LM, 50% proximal LCx, 80% OM1, 90% mid LAD, 50% D1, and 90% D2.  Given degree and complexity of patient's disease, recommendations were for consultation with CVTS for CABG procedure.   Patient underwent a four-vessel CABG on 04/13/2002.  LIMA-LAD, SVG-OM1, SVG-D1, and SVG-PDA bypass grafts were placed.   Repeat diagnostic left heart catheterization was performed on 02/03/2013 revealing a normal LVEF of 50%.  Multivessel CAD once again demonstrated; 80% stenosis mid LM, 80% stenosis of the proximal LAD, 80% stenosis of OM1, and 100% stenosis proximal RCA.  Patient with patent LIMA-LAD and SVG-OM1 grafts.  SVG-PDA and SVG-DG 1 grafts were occluded with collaterals to PDA and DG1 from the LAD.   Myocardial perfusion imaging study performed on 09/22/2018 revealed an LVEF of 45%.  There was inferior wall hypokinesis noted.  Left ventricular cavity size was enlarged.  There was evidence of a moderate inferior scar without evidence for ischemia.  Blood pressure well controlled  at 126/78 mmHg  on prescribed beta-blocker (metoprolol  to tartrate) monotherapy.  He is on an alpha-blocker (sildosin) for his prostate, which likely has some degree of effect on his blood pressures. Patient is on atorvastatin  + omega-3 fatty acid for his HLD diagnosis and ASCVD prevention.  Patient has a supply of short acting nitrates (NTG) to use on a as needed basis for recurrent angina/anginal equivalent symptoms; denied recent use.  Patient is not diabetic.  He does not have an OSAH diagnosis.  Patient active per baseline.  Patient is able to complete all of his  ADL/IADLs without cardiovascular limitation.  Per the [facility]ASI, patient is able to achieve at least 4 METS of physical activity without experiencing any significant degree of angina/anginal equivalent symptoms.  No changes were made to his medication regimen.  Patient scheduled to follow-up with outpatient cardiology in 4 months or sooner if needed.   [name] is scheduled for an elective CYSTOSCOPY WITH BIOPSY; TURBT (TRANSURETHRAL RESECTION OF BLADDER TUMOR); CYSTOSCOPY, WITH RETROGRADE PYELOGRAM (Bilateral); REMOVAL, STENT, URETER, CYSTOSCOPIC (Bilateral); CYSTOSCOPY, FLEXIBLE, WITH STENT REPLACEMENT (Bilateral) on 11/19/2023 with Dr. Glendia JAYSON Barba, MD. Given patient's past medical history significant for cardiovascular diagnoses, presurgical cardiac clearance was sought by the PAT team. Per cardiology, this patient is optimized for surgery and may proceed with the planned procedural course with a LOW risk of significant perioperative cardiovascular complications.  In review of the patient's chart, it is noted that he is on daily oral antithrombotic therapy. He has been instructed on recommendations for holding his low dose ASA for 5 days prior to his procedure with plans to restart as soon as postoperative bleeding risk felt to be minimized by his primary attending surgeon. The patient has been instructed that his last dose should be on  11/13/2023.  Patient denies previous perioperative complications with anesthesia in the past. In review his EMR, it is noted that patient underwent a general anesthetic course here at [facility] (ASA III) in 05/2023 without documented complications.   MOST RECENT VITAL SIGNS:    11/12/2023    2:00 PM 10/20/2023    1:29 PM 09/02/2023    1:26 PM  Vitals with BMI  Height   5' 6  Weight 164 lbs  152 lbs  BMI   24.55  Systolic  149 119  Diastolic  74 60  Pulse   59   PROVIDERS/SPECIALISTS: NOTE: Primary physician provider listed below. Patient may have been seen by APP or partner within same practice.   PROVIDER ROLE / SPECIALTY LAST [name] Glendia JAYSON, MD Urology (Surgeon) 09/02/2023  Rudolpho Norleen BIRCH, MD Primary Care Provider 10/15/2023  Ammon Blunt, MD Cardiology 11/11/2023   Douglas Rule, MD Nephrology 09/24/2023  Melanee Piggs, MD Medical Oncology 05/15/2023   ALLERGIES: Allergies  Allergen Reactions   Cidex [Glutaral] Anaphylaxis   Other Anaphylaxis    - Ortho-phthalaldehyde  - trospion (Sanctura ) cause AMS    CURRENT HOME MEDICATIONS: No current facility-administered medications for this encounter.    acetaminophen  (TYLENOL ) 500 MG tablet   ascorbic acid (VITAMIN C) 500 MG tablet   aspirin  81 MG tablet   atorvastatin  (LIPITOR) 10 MG tablet   cetirizine (ZYRTEC) 10 MG tablet   Coenzyme Q10 100 MG capsule   feeding supplement (ENSURE ENLIVE / ENSURE PLUS) LIQD   fluticasone  (FLONASE ) 50 MCG/ACT nasal spray   GLUCOSAMINE-CHONDROITIN PO   Magnesium 200 MG TABS   metoprolol  tartrate (LOPRESSOR ) 25 MG tablet  metroNIDAZOLE (METROGEL) 1 % gel   Multiple Vitamin (MULTIVITAMIN) tablet   nitroGLYCERIN  (NITROSTAT ) 0.4 MG SL tablet   Omega-3 Fatty Acids (FISH OIL) 1000 MG CAPS   Probiotic Product (MEGA PROBIOTIC) CAPS   silodosin  (RAPAFLO ) 8 MG CAPS capsule   Vitamin A 2400 MCG (8000 UT) CAPS   zinc gluconate 50 MG tablet    finasteride  (PROSCAR ) 5 MG tablet   HISTORY: Past Medical History:  Diagnosis Date   Anemia    Anemia in stage 4 chronic kidney disease (HCC)    Aneurysm of infrarenal abdominal aorta    a.) CTA AP 07/28/2019: measured 3.0 cm   Aneurysm of left common iliac artery    a.) CTA AP 07/28/2019: measured 1.6 cm   Aneurysm of right common carotid artery    a.) CTA AP 07/28/2019: measured 2.1 cm   Anginal pain    Aortic atherosclerosis    Benign prostatic hyperplasia    Bifascicular block (RBBB + LAFB)    Bilateral renal cysts    Bladder cancer (HCC)    Chronic kidney disease, stage IV (severe) (HCC)    Coronary artery disease    a.) LHC 04/12/2002 --> LVEF 42%; severe 3v CAD. 4v CABG on 04/13/2002. b.) LHC 02/03/2013 --> LVEF 50%; severe 3v CAD with patent LIMA-LAD and SVG-OM1 grafts; SVG-PDA and SVG-DG1 occluded with collaterals to PDA and DG1 from LAD.   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   a.) LIMA-LAD, SVG-OM1, SVG-D1, SVG-PDA   Smoldering multiple myeloma    Tubular adenoma of colon 02/03/2007   Past Surgical History:  Procedure Laterality Date   CARDIAC CATHETERIZATION Left 04/13/2002   Procedure: CARDIAC CATHETERIZATION; Location: [facility]; Surgeon; Marsa Dooms, MD   CARDIAC CATHETERIZATION Left 02/03/2013   Procedure: CARDIAC CATHETERIZATION; Location: [facility]; Surgeon: Marsa Dooms, MD   CATARACT EXTRACTION W/ INTRAOCULAR LENS IMPLANT Bilateral 2021   COLONOSCOPY W/ POLYPECTOMY     COLONOSCOPY WITH PROPOFOL  N/A 04/09/2015   Procedure: COLONOSCOPY WITH PROPOFOL ;  Surgeon: Deward CINDERELLA Piedmont, MD;  Location[facility] END[facility]COPY;  Service: Gastroenterology;  Laterality: N/A;    CORONARY ANGIOPLASTY     CORONARY ARTERY BYPASS GRAFT N/A 04/13/2002   Procedure: 4v CABG (LIMA-LAD, SVG-OM1, SVG-D1, SVG-PDA); Location: [facility]; Surgeon: Marget, MD   CYSTOSCOPY N/A 07/17/2021   Procedure: PHYLLIS;  Surgeon: Twylla Glendia BROCKS, MD;  Location: [facility] ORS;  Service: Urology;  Laterality: N/A;   CYSTOSCOPY N/A 06/24/2022   Procedure: DIAGNOSTIC CYSTOSCOPY;  Surgeon: Twylla Glendia BROCKS, MD;  Location: [facility] ORS;  Service: Urology;  Laterality: N/A;   CYSTOSCOPY W/ RETROGRADES Bilateral 05/26/2023   Procedure: CYSTOSCOPY, WITH RETROGRADE PYELOGRAM;  Surgeon: Twylla Glendia BROCKS, MD;  Location: [facility] ORS;  Service: Urology;  Laterality: Bilateral;  right stent placement, bladder biopsy   CYSTOSCOPY WITH BIOPSY N/A 07/17/2021   Procedure: CYSTOSCOPY WITH BIOPSY;  Surgeon: Twylla Glendia BROCKS, MD;  Location: [facility] ORS;  Service: Urology;  Laterality: N/A;   CYSTOSCOPY WITH BIOPSY N/A 06/24/2022   Procedure: CYSTOSCOPY WITH BLADDER BIOPSY;  Surgeon: Twylla Glendia BROCKS, MD;  Location: [facility] ORS;  Service: Urology;  Laterality: N/A;   CYSTOSCOPY WITH FULGERATION N/A 07/17/2021   Procedure:  CYSTOSCOPY WITH FULGERATION;  Surgeon: Twylla Glendia BROCKS, MD;  Location: [facility] ORS;  Service: Urology;  Laterality: N/A;   CYSTOSCOPY WITH FULGERATION N/A 06/24/2022   Procedure: CYSTOSCOPY WITH FULGERATION;  Surgeon: Twylla Glendia BROCKS, MD;  Location: [facility] ORS;  Service: Urology;  Laterality: N/A;   IR BONE MARROW BIOPSY & ASPIRATION  12/17/2022   IR NEPHROSTOMY PLACEMENT LEFT  06/10/2023   IR RADIOLOGIST EVAL & MGMT  06/10/2023   IR URETERAL STENT PLACEMENT EXISTING ACCESS LEFT  06/17/2023   TONSILLECTOMY     TRANSURETHRAL RESECTION OF BLADDER TUMOR N/A 11/13/2020   Procedure: TRANSURETHRAL RESECTION OF BLADDER TUMOR (TURBT);  Surgeon: Twylla Glendia BROCKS, MD;  Location: [facility] ORS;  Service: Urology;  Laterality: N/A;   TRANSURETHRAL RESECTION OF BLADDER TUMOR N/A 06/24/2022   Procedure: TRANSURETHRAL RESECTION OF BLADDER TUMOR  (TURBT);  Surgeon: Twylla Glendia BROCKS, MD;  Location: [facility] ORS;  Service: Urology;  Laterality: N/A;   TRANSURETHRAL RESECTION OF BLADDER TUMOR WITH GYRUS (TURBT-GYRUS)  06/24/2022   TRANSURETHRAL RESECTION OF PROSTATE     Family History  Problem Relation Age of Onset   Prostate cancer Father        Metastatic   Cancer Father    Social History   Tobacco Use   Smoking status: Former    Current packs/day: 0.00    Average packs/day: 0.8 packs/day for 60.0 years (45.0 ttl pk-yrs)    Types: Cigarettes    Start date: 59    Quit date: 2008    Years since quitting: 17.7    Passive exposure: Past   Smokeless tobacco: Never  Substance Use Topics   Alcohol use: Yes    Alcohol/week: 2.0 standard drinks of alcohol    Types: 2 Standard drinks or equivalent per week    Comment: 1 glass wine every night   LABS:  Hospital Outpatient Visit on 11/13/2023  Component Date Value Ref Range Status   Sodium 11/13/2023 141  135 - 145 mmol/L Final   Potassium 11/13/2023 4.9  3.5 - 5.1 mmol/L Final   Chloride 11/13/2023 110  98 - 111 mmol/L Final   CO2 11/13/2023 24  22 - 32 mmol/L Final   Glucose, Bld 11/13/2023 95  70 - 99 mg/dL Final   Glucose reference range applies only to samples taken after fasting for at least 8 hours.   BUN 11/13/2023 48 (H)  8 - 23 mg/dL Final   Creatinine, Ser 11/13/2023 3.07 (H)  0.61 - 1.24 mg/dL Final   Calcium  11/13/2023 8.9  8.9 - 10.3 mg/dL Final   GFR, Estimated 11/13/2023 20 (L)  >60 mL/min Final   Comment: (NOTE) Calculated using the CKD-EPI Creatinine Equation (2021)    Anion gap 11/13/2023 7  5 - 15 Final   Performed at [facility], [address] [place], [place] [address]   WBC 11/13/2023 7.3  4.0 - 10.5 K/uL Final   RBC 11/13/2023 3.27 (L)  4.22 - 5.81 MIL/uL Final   Hemoglobin 11/13/2023 11.3 (L)  13.0 - 17.0 g/dL Final   HCT 90/73/7974 34.9 (L)  39.0 - 52.0 % Final   MCV 11/13/2023 106.7 (H)  80.0 - 100.0 fL Final   MCH 11/13/2023 34.6  (H)  26.0 - 34.0 pg Final   MCHC 11/13/2023 32.4  30.0 - 36.0 g/dL Final   RDW 90/73/7974 14.0  11.5 - 15.5 % Final   Platelets 11/13/2023 167  150 - 400 K/uL Final   nRBC 11/13/2023 0.0  0.0 - 0.2 %  Final   Performed at [facility], [address] [place], [place] [address]  Appointment on 11/11/2023  Component Date Value Ref Range Status   Specific Gravity, UA 11/11/2023 1.010  1.005 - 1.030 Final   pH, UA 11/11/2023 6.0  5.0 - 7.5 Final   Color, UA 11/11/2023 Yellow  Yellow Final   Appearance Ur 11/11/2023 Clear  Clear Final   Leukocytes,UA 11/11/2023 1+ (A)  Negative Final   Protein,UA 11/11/2023 1+ (A)  Negative/Trace Final   Glucose, UA 11/11/2023 Negative  Negative Final   Ketones, UA 11/11/2023 Negative  Negative Final   RBC, UA 11/11/2023 2+ (A)  Negative Final   Bilirubin, UA 11/11/2023 Negative  Negative Final   Urobilinogen, Ur 11/11/2023 0.2  0.2 - 1.0 mg/dL Final   Nitrite, UA 90/75/7974 Negative  Negative Final   Microscopic Examination 11/11/2023 See below:   Final   Microscopic was indicated and was performed.   Urine Culture, Comprehensive 11/11/2023 Preliminary report   Preliminary   Organism ID, Bacteria 11/11/2023 Comment   Preliminary   No growth after 18-24 hours.   WBC, UA 11/11/2023 11-30 (A)  0 - 5 /hpf Final   RBC, Urine 11/11/2023 >30 (A)  0 - 2 /hpf Final   Epithelial Cells (non renal) 11/11/2023 0-10  0 - 10 /hpf Final   Bacteria, UA 11/11/2023 Few  None seen/Few Final  Appointment on 10/27/2023  Component Date Value Ref Range Status   Prostate Specific Ag, Serum 10/27/2023 <0.1  0.0 - 4.0 ng/mL Final   Comment: Roche ECLIA methodology. According to the American Urological Association, Serum PSA should decrease and remain at undetectable levels after radical prostatectomy. The AUA defines biochemical recurrence as an initial PSA value 0.2 ng/mL or greater followed by a subsequent confirmatory PSA value 0.2 ng/mL or greater. Values  obtained with different assay methods or kits cannot be used interchangeably. Results cannot be interpreted as absolute evidence of the presence or absence of malignant disease.    Glucose 10/27/2023 90  70 - 99 mg/dL Final   BUN 90/90/7974 38 (H)  8 - 27 mg/dL Final   Creatinine, Ser 10/27/2023 2.87 (H)  0.76 - 1.27 mg/dL Final   eGFR 90/90/7974 22 (L)  >59 mL/min/1.73 Final   BUN/Creatinine Ratio 10/27/2023 13  10 - 24 Final   Sodium 10/27/2023 140  134 - 144 mmol/L Final   Potassium 10/27/2023 5.4 (H)  3.5 - 5.2 mmol/L Final   Chloride 10/27/2023 109 (H)  96 - 106 mmol/L Final   CO2 10/27/2023 16 (L)  20 - 29 mmol/L Final   Calcium  10/27/2023 9.0  8.6 - 10.2 mg/dL Final  Appointment on 90/97/7974  Component Date Value Ref Range Status   Hemoglobin 10/20/2023 9.6 (L)  13.0 - 17.0 g/dL Final   HCT 90/97/7974 29.5 (L)  39.0 - 52.0 % Final   Performed at [facility], [address] [place], [place] [address]    ECG: Date: 11/13/2023  Time ECG obtained: 1422 PM Rate: 54 bpm Rhythm: Sinus bradycardia; RBBB + LAFB Axis (leads I and aVF): normal Intervals: PR 188 ms. QRS 140 ms. QTc 451 ms. ST segment and T wave changes: Lateral T wave inversions in leads V5-V6.  Inferior T wave inversions in leads III and aVF. Evidence of a possible, age undetermined, prior infarct:  Yes; inferior Comparison: Similar to previous tracing obtained on 12/16/2022.  Lateral TWIs are new; no anginal/= symptoms - concern with potential lead misplacement.  IMAGING / PROCEDURES: ULTRASOUND RENAL COMPLETE performed on 04/27/2023 Bilateral grade 2 hydronephrosis. Worsening hydronephrosis compared with prior examination Bilateral simple renal cysts. Debris within the bladder with mild bladder wall thickening. Could correlate chronic cystitis possible chronic inflammatory infectious process.   NM PET IMAGE INITIAL (PI) WHOLE BODY  performed on 12/17/2022 No focal hypermetabolic or lytic bone lesions  identified.   MYOCARDIAL PERFUSION IMAGING STUDY (LEXISCAN) performed on 09/22/2018 LVEF 45% Inferior wall hypokinesis Left ventricular cavity size enlarged No artifacts noted Moderate inferior scar without evidence of ischemia   LEFT HEART CATHETERIZATION AND CORONARY ANGIOGRAPHY performed on 02/03/2013 LVEF 50% Right dominant coronary circulation CAD 80% stenosis of the mid LM 80% stenosis of the proximal LAD 80% stenosis OM1 100% stenosis proximal RCA Distal RCA supplied by moderate collaterals from the distal LAD and proximal RCA Previously placed bypass grafts LIMA-LAD patent SVG-OM1 patent SVG-PDA occluded SVG-DG1 occluded Collaterals from PDA and DG1 from LAD   CORONARY ARTERY BYPASS GRAFTING performed on  04/13/2002 Four-vessel revascularization (CABG) procedure LIMA-LAD SVG-OM1 SVG-D1 SVG-PDA   LEFT HEART CATHETERIZATION AND CORONARY ANGIOGRAPHY performed on 04/13/2002 LVEF 42% Multivessel CAD 100% stenosis proximal RCA 50% stenosis x 2 distal RCA 80% stenosis LM 50% stenosis proximal LCx 80% and 50% stenoses OM1 90 and 50% stenosis mid LAD 50% stenosis D1 100% stenosis D2 Recommendations: consult CVTS for consideration of revascularization (CABG) procedure   IMPRESSION AND PLAN: [name] has been referred for pre-anesthesia review and clearance prior to him undergoing the planned anesthetic and procedural courses. Available labs, pertinent testing, and imaging results were personally reviewed by me in preparation for upcoming operative/procedural course. [facility] medical record has been updated following extensive record review and patient interview with PAT staff.   This patient has been appropriately cleared by cardiology with an overall low risk of patient experiencing significant perioperative cardiovascular complications. Based on clinical review performed today (11/17/23), barring any significant acute changes in the patient's overall  condition, it is anticipated that he will be able to proceed with the planned surgical intervention. Any acute changes in clinical condition may necessitate his procedure being postponed and/or cancelled. Patient will meet with anesthesia team (MD and/or CRNA) on the day of his procedure for preoperative evaluation/assessment. Questions regarding anesthetic course will be fielded at that time.   Pre-surgical instructions were reviewed with the patient during his PAT appointment, and questions were fielded to satisfaction by PAT clinical staff. He has been instructed on which medications that he will need to hold prior to surgery, as well as the ones that have been deemed safe/appropriate to take on the day of his procedure. As part of the general education provided by PAT, patient made aware both verbally and in writing, that he would need to abstain from the use of any illegal substances during his perioperative course. He was advised that failure to follow the provided instructions could necessitate case cancellation or result in serious perioperative complications up to and including death. Patient encouraged to contact PAT and/or his surgeon's office to discuss any questions or concerns that may arise prior to surgery; verbalized understanding.   Dorise Pereyra, MSN, APRN, FNP-C, CEN [facility]  Perioperative Services Nurse Practitioner Phone: [phone] Fax: [phone] 11/17/23 12:32 PM  NOTE: This note has been prepared using Dragon dictation software. Despite my best ability to proofread, there is always the potential that unintentional transcriptional errors may still occur from this process.

## 2023-11-17 NOTE — Progress Notes (Signed)
 Hgb 11.3 today, no inj given

## 2023-11-18 LAB — KAPPA/LAMBDA LIGHT CHAINS
Kappa free light chain: 22.8 mg/L — ABNORMAL HIGH (ref 3.3–19.4)
Kappa, lambda light chain ratio: 0.06 — ABNORMAL LOW (ref 0.26–1.65)
Lambda free light chains: 387.8 mg/L — ABNORMAL HIGH (ref 5.7–26.3)

## 2023-11-19 ENCOUNTER — Ambulatory Visit

## 2023-11-19 ENCOUNTER — Ambulatory Visit
Admission: RE | Admit: 2023-11-19 | Discharge: 2023-11-19 | Disposition: A | Discharge status: Home / Self Care | Attending: Urology | Admitting: Urology

## 2023-11-19 ENCOUNTER — Encounter
Admission: RE | Disposition: A | Discharge status: Home / Self Care | Payer: Self-pay | Source: Home / Self Care | Attending: Urology

## 2023-11-19 ENCOUNTER — Other Ambulatory Visit: Payer: Self-pay

## 2023-11-19 ENCOUNTER — Encounter: Payer: Self-pay | Admitting: Urology

## 2023-11-19 ENCOUNTER — Ambulatory Visit: Payer: Self-pay | Admitting: Urgent Care

## 2023-11-19 DIAGNOSIS — Z951 Presence of aortocoronary bypass graft: Secondary | ICD-10-CM | POA: Diagnosis not present

## 2023-11-19 DIAGNOSIS — Z8679 Personal history of other diseases of the circulatory system: Secondary | ICD-10-CM | POA: Insufficient documentation

## 2023-11-19 DIAGNOSIS — E785 Hyperlipidemia, unspecified: Secondary | ICD-10-CM | POA: Diagnosis not present

## 2023-11-19 DIAGNOSIS — Z87891 Personal history of nicotine dependence: Secondary | ICD-10-CM | POA: Diagnosis not present

## 2023-11-19 DIAGNOSIS — Z7982 Long term (current) use of aspirin: Secondary | ICD-10-CM | POA: Insufficient documentation

## 2023-11-19 DIAGNOSIS — Y842 Radiological procedure and radiotherapy as the cause of abnormal reaction of the patient, or of later complication, without mention of misadventure at the time of the procedure: Secondary | ICD-10-CM | POA: Insufficient documentation

## 2023-11-19 DIAGNOSIS — D472 Monoclonal gammopathy: Secondary | ICD-10-CM | POA: Insufficient documentation

## 2023-11-19 DIAGNOSIS — N401 Enlarged prostate with lower urinary tract symptoms: Secondary | ICD-10-CM | POA: Insufficient documentation

## 2023-11-19 DIAGNOSIS — N184 Chronic kidney disease, stage 4 (severe): Secondary | ICD-10-CM | POA: Insufficient documentation

## 2023-11-19 DIAGNOSIS — I452 Bifascicular block: Secondary | ICD-10-CM | POA: Diagnosis not present

## 2023-11-19 DIAGNOSIS — N135 Crossing vessel and stricture of ureter without hydronephrosis: Secondary | ICD-10-CM

## 2023-11-19 DIAGNOSIS — Z7902 Long term (current) use of antithrombotics/antiplatelets: Secondary | ICD-10-CM | POA: Insufficient documentation

## 2023-11-19 DIAGNOSIS — Z79899 Other long term (current) drug therapy: Secondary | ICD-10-CM | POA: Diagnosis not present

## 2023-11-19 DIAGNOSIS — Z7722 Contact with and (suspected) exposure to environmental tobacco smoke (acute) (chronic): Secondary | ICD-10-CM | POA: Insufficient documentation

## 2023-11-19 DIAGNOSIS — I2510 Atherosclerotic heart disease of native coronary artery without angina pectoris: Secondary | ICD-10-CM | POA: Insufficient documentation

## 2023-11-19 DIAGNOSIS — I1310 Hypertensive heart and chronic kidney disease without heart failure, with stage 1 through stage 4 chronic kidney disease, or unspecified chronic kidney disease: Secondary | ICD-10-CM | POA: Diagnosis not present

## 2023-11-19 DIAGNOSIS — Z923 Personal history of irradiation: Secondary | ICD-10-CM | POA: Diagnosis not present

## 2023-11-19 DIAGNOSIS — D631 Anemia in chronic kidney disease: Secondary | ICD-10-CM | POA: Diagnosis not present

## 2023-11-19 DIAGNOSIS — Z8551 Personal history of malignant neoplasm of bladder: Secondary | ICD-10-CM | POA: Diagnosis not present

## 2023-11-19 DIAGNOSIS — N3040 Irradiation cystitis without hematuria: Secondary | ICD-10-CM | POA: Insufficient documentation

## 2023-11-19 DIAGNOSIS — N131 Hydronephrosis with ureteral stricture, not elsewhere classified: Secondary | ICD-10-CM | POA: Diagnosis not present

## 2023-11-19 HISTORY — PX: CYSTOSCOPY W/ RETROGRADES: SHX1426

## 2023-11-19 HISTORY — PX: CYSTOSCOPY W/ URETERAL STENT PLACEMENT: SHX1429

## 2023-11-19 LAB — MULTIPLE MYELOMA PANEL, SERUM
Albumin SerPl Elph-Mcnc: 4.1 g/dL (ref 2.9–4.4)
Albumin/Glob SerPl: 1.4 (ref 0.7–1.7)
Alpha 1: 0.2 g/dL (ref 0.0–0.4)
Alpha2 Glob SerPl Elph-Mcnc: 0.6 g/dL (ref 0.4–1.0)
B-Globulin SerPl Elph-Mcnc: 0.6 g/dL — ABNORMAL LOW (ref 0.7–1.3)
Gamma Glob SerPl Elph-Mcnc: 1.7 g/dL (ref 0.4–1.8)
Globulin, Total: 3.1 g/dL (ref 2.2–3.9)
IgA: 39 mg/dL — ABNORMAL LOW (ref 61–437)
IgG (Immunoglobin G), Serum: 2013 mg/dL — ABNORMAL HIGH (ref 603–1613)
IgM (Immunoglobulin M), Srm: 41 mg/dL (ref 15–143)
M Protein SerPl Elph-Mcnc: 1.4 g/dL — ABNORMAL HIGH
Total Protein ELP: 7.2 g/dL (ref 6.0–8.5)

## 2023-11-19 SURGERY — CYSTOSCOPY, WITH RETROGRADE PYELOGRAM
Anesthesia: General | Laterality: Bilateral

## 2023-11-19 MED ORDER — ONDANSETRON HCL 4 MG/2ML IJ SOLN
INTRAMUSCULAR | Status: DC | PRN
  Administered 2023-11-19: 4 mg via INTRAVENOUS

## 2023-11-19 MED ORDER — LACTATED RINGERS IV SOLN: INTRAVENOUS | Status: DC

## 2023-11-19 MED ORDER — EPHEDRINE SULFATE-NACL 50-0.9 MG/10ML-% IV SOSY
PREFILLED_SYRINGE | INTRAVENOUS | Status: DC | PRN
  Administered 2023-11-19 (×3): 5 mg via INTRAVENOUS

## 2023-11-19 MED ORDER — CEFAZOLIN SODIUM-DEXTROSE 2-4 GM/100ML-% IV SOLN
2.0000 g | INTRAVENOUS | Status: AC
  Administered 2023-11-19: 2 g via INTRAVENOUS

## 2023-11-19 MED ORDER — CHLORHEXIDINE GLUCONATE 0.12 % MT SOLN
15.0000 mL | Freq: Once | OROMUCOSAL | Status: AC
  Administered 2023-11-19: 15 mL via OROMUCOSAL

## 2023-11-19 MED ORDER — DEXAMETHASONE SODIUM PHOSPHATE 10 MG/ML IJ SOLN
INTRAMUSCULAR | Status: DC | PRN
  Administered 2023-11-19: 5 mg via INTRAVENOUS

## 2023-11-19 MED ORDER — IOHEXOL 180 MG/ML  SOLN
INTRAMUSCULAR | Status: DC | PRN
  Administered 2023-11-19 (×4): 10 mL

## 2023-11-19 MED ORDER — CEFAZOLIN SODIUM-DEXTROSE 2-4 GM/100ML-% IV SOLN
INTRAVENOUS | Status: AC
  Filled 2023-11-19: qty 100

## 2023-11-19 MED ORDER — ORAL CARE MOUTH RINSE: 15.0000 mL | Freq: Once | OROMUCOSAL | Status: AC

## 2023-11-19 MED ORDER — LIDOCAINE HCL (CARDIAC) PF 100 MG/5ML IV SOSY
PREFILLED_SYRINGE | INTRAVENOUS | Status: DC | PRN
  Administered 2023-11-19: 70 mg via INTRAVENOUS

## 2023-11-19 MED ORDER — SODIUM CHLORIDE 0.9 % IR SOLN
Status: DC | PRN
  Administered 2023-11-19: 3000 mL

## 2023-11-19 MED ORDER — FENTANYL CITRATE (PF) 100 MCG/2ML IJ SOLN: 25.0000 ug | INTRAMUSCULAR | Status: DC | PRN

## 2023-11-19 MED ORDER — ONDANSETRON HCL 4 MG/2ML IJ SOLN: 4.0000 mg | Freq: Once | INTRAMUSCULAR | Status: DC | PRN

## 2023-11-19 MED ORDER — ACETAMINOPHEN 10 MG/ML IV SOLN: 1000.0000 mg | Freq: Once | INTRAVENOUS | Status: DC | PRN

## 2023-11-19 MED ORDER — CHLORHEXIDINE GLUCONATE 0.12 % MT SOLN
OROMUCOSAL | Status: AC
Start: 2023-11-19 — End: 2023-11-19
  Filled 2023-11-19: qty 15

## 2023-11-19 MED ORDER — OXYCODONE HCL 5 MG PO TABS: 5.0000 mg | ORAL_TABLET | Freq: Once | ORAL | Status: DC | PRN

## 2023-11-19 MED ORDER — FENTANYL CITRATE (PF) 100 MCG/2ML IJ SOLN
INTRAMUSCULAR | Status: AC
  Filled 2023-11-19: qty 2

## 2023-11-19 MED ORDER — ACETAMINOPHEN 10 MG/ML IV SOLN
INTRAVENOUS | Status: AC
Start: 2023-11-19 — End: 2023-11-19
  Filled 2023-11-19: qty 100

## 2023-11-19 MED ORDER — OXYCODONE HCL 5 MG/5ML PO SOLN: 5.0000 mg | Freq: Once | ORAL | Status: DC | PRN

## 2023-11-19 MED ORDER — FENTANYL CITRATE (PF) 100 MCG/2ML IJ SOLN
INTRAMUSCULAR | Status: DC | PRN
  Administered 2023-11-19 (×2): 25 ug via INTRAVENOUS
  Administered 2023-11-19: 50 ug via INTRAVENOUS

## 2023-11-19 MED ORDER — PROPOFOL 10 MG/ML IV BOLUS
INTRAVENOUS | Status: AC
Start: 2023-11-19 — End: 2023-11-19
  Filled 2023-11-19: qty 20

## 2023-11-19 MED ORDER — ACETAMINOPHEN 10 MG/ML IV SOLN
INTRAVENOUS | Status: DC | PRN
  Administered 2023-11-19: 1000 mg via INTRAVENOUS

## 2023-11-19 MED ORDER — PROPOFOL 10 MG/ML IV BOLUS
INTRAVENOUS | Status: DC | PRN
  Administered 2023-11-19: 140 mg via INTRAVENOUS

## 2023-11-19 SURGICAL SUPPLY — 29 items
BAG DRAIN SIEMENS DORNER NS (MISCELLANEOUS) ×2 IMPLANT
BAG URINE DRAIN 2000ML AR STRL (UROLOGICAL SUPPLIES) ×2 IMPLANT
BRUSH SCRUB EZ 1% IODOPHOR (MISCELLANEOUS) ×2 IMPLANT
BRUSH SCRUB EZ 4% CHG (MISCELLANEOUS) ×2 IMPLANT
CATH FOL 2WAY LX 18X30 (CATHETERS) ×2 IMPLANT
CATH URETL OPEN END 6X70 (CATHETERS) ×2 IMPLANT
DRAPE UTILITY 15X26 TOWEL STRL (DRAPES) ×2 IMPLANT
DRSG TELFA 3X4 N-ADH STERILE (GAUZE/BANDAGES/DRESSINGS) ×2 IMPLANT
ELECTRODE REM PT RTRN 9FT ADLT (ELECTROSURGICAL) ×2 IMPLANT
GLOVE BIOGEL PI IND STRL 7.5 (GLOVE) ×2 IMPLANT
GOWN STRL REUS W/ TWL LRG LVL3 (GOWN DISPOSABLE) ×4 IMPLANT
GOWN STRL REUS W/ TWL XL LVL3 (GOWN DISPOSABLE) ×2 IMPLANT
GOWN STRL REUS W/TWL XL LVL4 (GOWN DISPOSABLE) ×2 IMPLANT
GUIDEWIRE STR DUAL SENSOR (WIRE) ×2 IMPLANT
KIT TURNOVER CYSTO (KITS) ×2 IMPLANT
NDL SAFETY ECLIP 18X1.5 (MISCELLANEOUS) ×2 IMPLANT
PACK CYSTO AR (MISCELLANEOUS) ×2 IMPLANT
SET CYSTO W/LG BORE CLAMP LF (SET/KITS/TRAYS/PACK) ×2 IMPLANT
SET IRRIG Y TYPE TUR BLADDER L (SET/KITS/TRAYS/PACK) ×2 IMPLANT
SOL .9 NS 3000ML IRR UROMATIC (IV SOLUTION) ×4 IMPLANT
SOLN STERILE WATER 1000 ML (IV SOLUTION) ×2 IMPLANT
SOLN STERILE WATER BTL 1000 ML (IV SOLUTION) ×2 IMPLANT
STENT URO INLAY 6FRX22CM (STENTS) ×1 IMPLANT
STENT URO INLAY 6FRX24CM (STENTS) ×1 IMPLANT
SURGILUBE 2OZ TUBE FLIPTOP (MISCELLANEOUS) ×2 IMPLANT
SYR 10ML LL (SYRINGE) ×2 IMPLANT
SYRINGE TOOMEY IRRIG 70ML (MISCELLANEOUS) ×2 IMPLANT
WATER STERILE IRR 3000ML UROMA (IV SOLUTION) ×2 IMPLANT
WATER STERILE IRR 500ML POUR (IV SOLUTION) ×2 IMPLANT

## 2023-11-19 NOTE — Op Note (Signed)
   Preoperative diagnosis:  Personal history bladder cancer Bilateral ureteral obstruction  Postoperative diagnosis:  Same  Procedure: Cystoscopy with bilateral retrograde pyelograms Bilateral ureteral stent exchange  Surgeon: Glendia JAYSON Barba, MD  Anesthesia: General  Complications: None  Intraoperative findings:  Cystoscopy: Urethra normal in caliber without stricture.  Nonocclusive prostate.  No solid or papillary tumors identified.  Mild erythema bladder base most likely secondary to indwelling stent Right retrograde pyelogram: Moderate right hydronephrosis with hydroureter to the distal ureter Left retrograde pyelogram: Mild-moderate right hydronephrosis  EBL: Minimal  Specimens: None  Indication: [name] is a 78 y.o. patient with a history of recurrent urothelial carcinoma the bladder, prostate cancer status post radiation therapy and bilateral ureteral strictures.  He presents today for surveillance cystoscopy with bilateral retrograde pyelogram and ureteral stent exchange.  After reviewing the management options for treatment, he elected to proceed with the above surgical procedure(s). We have discussed the potential benefits and risks of the procedure, side effects of the proposed treatment, the likelihood of the patient achieving the goals of the procedure, and any potential problems that might occur during the procedure or recuperation. Informed consent has been obtained.  Description of procedure:  The patient was taken to the operating room and general anesthesia was induced.  The patient was placed in the dorsal lithotomy position, prepped and draped in the usual sterile fashion, and preoperative antibiotics were administered. A preoperative time-out was performed.   A 21 French cystoscope with 30 degree lens was lubricated, placed per urethra and advanced proximally to the bladder under direct vision with findings as described above.  The right ureteral stent  was grasped with endoscopic forceps and brought out through the urethral meatus.  A 0.038 Sensor wire was placed through the stent and advanced to the renal pelvis under fluoroscopic guidance.  A 77F open-ended ureteral catheter was then advanced over the guidewire to the renal pelvis.  Right retrograde pyelogram was performed with findings as described above.  The guidewire was replaced and the ureteral catheter was removed.  A 77F/22 cm Bard Optima Inlay stent was advanced over the guidewire fluoroscopic guidance and positioned within the renal pelvis.  Guidewires removed and a good stent curl was noted in the renal pelvis as well as bladder.  An identical procedure was performed on the contralateral side with findings as described above.  A 77F/24 cm Bard Optima Inlay stent was placed in a similar fashion with good positioning noted under fluoroscopy and direct vision.  After anesthetic reversal patient was transported to the PACU in stable condition.  Plan: 34-month follow-up office visit will be scheduled   Glendia JAYSON Barba, M.D.

## 2023-11-19 NOTE — Transfer of Care (Signed)
 Immediate Anesthesia Transfer of Care Note  Patient: [name]  Procedure(s) Performed: CYSTOSCOPY, WITH RETROGRADE PYELOGRAM (Bilateral) CYSTOSCOPY, FLEXIBLE, WITH STENT REPLACEMENT (Bilateral)  Patient Location: PACU  Anesthesia Type:General  Level of Consciousness: awake, alert , and oriented  Airway & Oxygen Therapy: Patient Spontanous Breathing  Post-op Assessment: Report given to RN and Post -op Vital signs reviewed and stable  Post vital signs: stable  Last Vitals:  Vitals Value Taken Time  BP 143/83 11/19/23 12:27  Temp    Pulse 69 11/19/23 12:29  Resp    SpO2 100 % 11/19/23 12:29  Vitals shown include unfiled device data.  Last Pain:  Vitals:   11/19/23 0947  PainSc: 0-No pain         Complications: No notable events documented.

## 2023-11-19 NOTE — Anesthesia Preprocedure Evaluation (Addendum)
 Anesthesia Evaluation  Patient identified by MRN, date of birth, ID band Patient awake    Reviewed: Allergy & Precautions, H&P , NPO status , Patient's Chart, lab work & pertinent test results  History of Anesthesia Complications Negative for: history of anesthetic complications  Airway Mallampati: III   Neck ROM: Full    Dental  (+) Missing, Chipped   Pulmonary former smoker (quit 2008)   Pulmonary exam normal breath sounds clear to auscultation       Cardiovascular hypertension, + CAD (s/p CABG 2004)  CABG: 2004.Normal cardiovascular exam(-) dysrhythmias  Rhythm:Regular Rate:Normal  ECG 11/13/23:  Sinus bradycardia Right bundle branch block Left anterior fascicular block Bifascicular block Possible Inferior infarct (cited on or before 16-Dec-2022) T wave abnormality, consider lateral ischemia  Myocardial perfusion 09/21/20:  1. LVEF 45% 2. Inferior wall hypokinesis 3. Left ventricular cavity size enlarged 4. No artifacts noted 5. Moderate inferior scar without evidence of ischemia    Neuro/Psych negative neurological ROS     GI/Hepatic negative GI ROS,,,  Endo/Other  negative endocrine ROS    Renal/GU Renal disease (stage IV CKD)   Bladder CA; prostate CA    Musculoskeletal   Abdominal   Peds  Hematology  (+) Blood dyscrasia, anemia   Anesthesia Other Findings Reviewed and agree with Dorise Boor pre-anesthesia clinical review note.    Cardiology note 11/11/23:  78 y.o. male with  1. Coronary artery disease involving native heart without angina pectoris, unspecified vessel or lesion type  2. History of cardiac catheterization  3. Hx of CABG  4. Primary hypertension  5. Hyperlipidemia, mixed   78 year old gentleman with known coronary disease, status post CABG, without exertional chest pain. ETT sestamibi study 04/11/2016 revealed mildly reduced left ventricular function, with moderate inferior scar  without ischemia, which was unchanged from previous study. The patient has essential hypertension, blood pressure in normal range off BP medications. The patient has hyperlipidemia, with excellent control of LDL cholesterol on atorvastatin .  Plan   1. Continue current medications 2. Counseled patient about low-sodium diet 3. DASH diet printed instructions given to the patient 4. Counseled patient about low-cholesterol diet 5. Continue atorvastatin  for hyperlipidemia management 6. Low-fat and cholesterol diet printed instructions given to the patient 7. Return to clinic for follow-up in 4 months   No orders of the defined types were placed in this encounter.  Return in about 4 months (around 03/12/2024).    Reproductive/Obstetrics                              Anesthesia Physical Anesthesia Plan  ASA: 3  Anesthesia Plan: General   Post-op Pain Management:    Induction: Intravenous  PONV Risk Score and Plan: 2 and Ondansetron , Dexamethasone  and Treatment may vary due to age or medical condition  Airway Management Planned: Oral ETT  Additional Equipment:   Intra-op Plan:   Post-operative Plan: Extubation in OR  Informed Consent: I have reviewed the patients History and Physical, chart, labs and discussed the procedure including the risks, benefits and alternatives for the proposed anesthesia with the patient or authorized representative who has indicated his/her understanding and acceptance.     Dental advisory given  Plan Discussed with: CRNA  Anesthesia Plan Comments: (Patient consented for risks of anesthesia including but not limited to:  - adverse reactions to medications - damage to eyes, teeth, lips or other oral mucosa - nerve damage due to positioning  - sore  throat or hoarseness - damage to heart, brain, nerves, lungs, other parts of body or loss of life  Informed patient about role of CRNA in peri- and intra-operative care.  Patient  voiced understanding.)         Anesthesia Quick Evaluation

## 2023-11-19 NOTE — Anesthesia Postprocedure Evaluation (Signed)
 Anesthesia Post Note  Patient: [name]  Procedure(s) Performed: CYSTOSCOPY, WITH RETROGRADE PYELOGRAM (Bilateral) CYSTOSCOPY, FLEXIBLE, WITH STENT REPLACEMENT (Bilateral)  Patient location during evaluation: PACU Anesthesia Type: General Level of consciousness: awake and alert, oriented and patient cooperative Pain management: pain level controlled Vital Signs Assessment: post-procedure vital signs reviewed and stable Respiratory status: spontaneous breathing, nonlabored ventilation and respiratory function stable Cardiovascular status: blood pressure returned to baseline and stable Postop Assessment: adequate PO intake Anesthetic complications: no   No notable events documented.   Last Vitals:  Vitals:   11/19/23 1227 11/19/23 1245  BP: (!) 143/83 (!) 151/70  Pulse: 69 63  Resp: 14 18  Temp: 36.5 C 36.6 C  SpO2: 100% 98%    Last Pain:  Vitals:   11/19/23 1245  PainSc: 0-No pain                 Alfonso Ruths

## 2023-11-19 NOTE — Discharge Instructions (Signed)
 DISCHARGE INSTRUCTIONS FOR KIDNEY STONE/URETERAL STENT   MEDICATIONS:  1. Resume all your other meds from home.  2.  AZO (over-the-counter) can help with the burning/stinging when you urinate.  ACTIVITY:  1. May resume regular activities in 24 hours. 2. No driving while on narcotic pain medications  3. Drink plenty of water   4. Continue to walk at home - you can still get blood clots when you are at home, so keep active, but don't over do it.  5. May return to work/school tomorrow or when you feel ready    SIGNS/SYMPTOMS TO CALL:  Common postoperative symptoms include urinary frequency, urgency, bladder spasm and blood in the urine  Please call us  if you have a fever greater than 101.5, uncontrolled nausea/vomiting, uncontrolled pain, dizziness, unable to urinate, excessively bloody urine, chest pain, shortness of breath, leg swelling, leg pain, or any other concerns or questions.   You can reach us  at [phone].   FOLLOW-UP:  1.  You will be contacted for a follow-up appointment

## 2023-11-19 NOTE — H&P (Signed)
 Urology H&P   Assessment/Recommendations:  1.  Recurrent urothelial carcinoma bladder Surveillance cystoscopy with possible bladder biopsy/TURBT  2.  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 Recurrent Ta urothelial carcinoma bladder TURBT July 2011; low-grade Ta TURBT December 2014; low-grade Ta; postresection mitomycin TURBT 07/2015; low-grade Ta; intravesical gemcitabine  x6 weeks TURBT 10/2020;Ta low-grade urothelial carcinoma with small focus suggestive of high-grade features; repeat intravesical gemcitabine  6 completed 01/07/2021 Bx w/ fulguration 07/17/2021; Ta low grade    2.  Intermediate risk adenocarcinoma prostate Treated IMRT+ ADT completed June 2020    3.  Radiation cystitis hyperbaric oxygen therapy   4.  Bilateral hydronephrosis AKI with creatinine 4.53 01/2023 RUS with bilateral hydronephrosis and incomplete bladder emptying Initially treated with Foley catheter drainage F/U ultrasound with persistent mild to moderate hydronephrosis Right retrograde pyelogram 05/26/26 with moderate hydronephrosis/hydroureter to the distal ureter with narrowing of the distal ureter.  Right ureteral stent placed.  The left ureteral orifice could not be identified and he subsequently underwent percutaneous nephrostomy placement April 2025 with subsequent internalization to a ureteral stent  History of Present Illness: [name] is a 78 y.o. with the above urologic history who presents for cystoscopy under anesthesia with possible bladder biopsy/TURBT and bilateral retrograde pyelogram with possible ureteral stent exchange  Past Medical History:  Diagnosis Date   Anemia in stage 4 chronic kidney disease (HCC)    Aneurysm of infrarenal abdominal aorta    a.) CTA AP 07/28/2019: measured 3.0 cm   Aneurysm of left common iliac artery     a.) CTA AP 07/28/2019: measured 1.6 cm   Aneurysm of right common carotid artery    a.) CTA AP 07/28/2019: measured 2.1 cm   Anginal pain    Aortic atherosclerosis    Benign prostatic hyperplasia    Bifascicular block (RBBB + LAFB)    Bilateral renal cysts    Bladder cancer (HCC)    Chronic kidney disease, stage IV (severe) (HCC)    Coronary artery disease    a.) LHC 04/12/2002 --> LVEF 42%; severe 3v CAD. 4v CABG on 04/13/2002. b.) LHC 02/03/2013 --> LVEF 50%; severe 3v CAD with patent LIMA-LAD and SVG-OM1 grafts; SVG-PDA and SVG-DG1 occluded with collaterals to PDA and DG1 from LAD.   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   a.) LIMA-LAD, SVG-OM1, SVG-D1, SVG-PDA   Smoldering multiple myeloma    Tubular adenoma of colon 02/03/2007    Past Surgical History:  Procedure Laterality Date   CARDIAC CATHETERIZATION Left 04/13/2002   Procedure: CARDIAC CATHETERIZATION; Location: [facility]; Surgeon; Marsa Dooms, MD   CARDIAC CATHETERIZATION Left 02/03/2013   Procedure: CARDIAC CATHETERIZATION; Location: [facility]; Surgeon: Marsa Dooms, MD   CATARACT EXTRACTION W/ INTRAOCULAR LENS IMPLANT Bilateral 2021   COLONOSCOPY W/ POLYPECTOMY     COLONOSCOPY WITH PROPOFOL  N/A 04/09/2015   Procedure: COLONOSCOPY WITH PROPOFOL ;  Surgeon: Deward CINDERELLA Piedmont, MD;  Location: [facility];  Service: Gastroenterology;  Laterality: N/A;   CORONARY ANGIOPLASTY     CORONARY ARTERY BYPASS GRAFT N/A 04/13/2002   Procedure: 4v CABG (LIMA-LAD, SVG-OM1, SVG-D1,  SVG-PDA); Location: [facility]; Surgeon: Marget, MD   CYSTOSCOPY N/A 07/17/2021   Procedure: PHYLLIS;  Surgeon: Twylla Glendia BROCKS, MD;  Location: [facility];   Service: Urology;  Laterality: N/A;   CYSTOSCOPY N/A 06/24/2022   Procedure: DIAGNOSTIC CYSTOSCOPY;  Surgeon: Twylla Glendia BROCKS, MD;  Location: [facility];  Service: Urology;  Laterality: N/A;   CYSTOSCOPY W/ RETROGRADES Bilateral 05/26/2023   Procedure: CYSTOSCOPY, WITH RETROGRADE PYELOGRAM;  Surgeon: Twylla Glendia BROCKS, MD;  Location: [facility];  Service: Urology;  Laterality: Bilateral;  right stent placement, bladder biopsy   CYSTOSCOPY WITH BIOPSY N/A 07/17/2021   Procedure: CYSTOSCOPY WITH BIOPSY;  Surgeon: Twylla Glendia BROCKS, MD;  Location: [facility];  Service: Urology;  Laterality: N/A;   CYSTOSCOPY WITH BIOPSY N/A 06/24/2022   Procedure: CYSTOSCOPY WITH BLADDER BIOPSY;  Surgeon: Twylla Glendia BROCKS, MD;  Location: [facility];  Service: Urology;  Laterality: N/A;   CYSTOSCOPY WITH FULGERATION N/A 07/17/2021   Procedure: CYSTOSCOPY WITH FULGERATION;  Surgeon: Twylla Glendia BROCKS, MD;  Location: [facility];  Service: Urology;  Laterality: N/A;   CYSTOSCOPY WITH FULGERATION N/A 06/24/2022   Procedure: CYSTOSCOPY WITH FULGERATION;  Surgeon: Twylla Glendia BROCKS, MD;  Location: [facility];  Service: Urology;  Laterality: N/A;   IR BONE MARROW BIOPSY & ASPIRATION  12/17/2022   IR NEPHROSTOMY PLACEMENT LEFT  06/10/2023   IR RADIOLOGIST EVAL & MGMT  06/10/2023   IR URETERAL STENT PLACEMENT EXISTING ACCESS LEFT  06/17/2023   TONSILLECTOMY     TRANSURETHRAL RESECTION OF BLADDER TUMOR N/A 11/13/2020   Procedure: TRANSURETHRAL RESECTION OF BLADDER TUMOR (TURBT);  Surgeon: Twylla Glendia BROCKS, MD;  Location: [facility];  Service: Urology;  Laterality: N/A;   TRANSURETHRAL RESECTION OF BLADDER TUMOR N/A 06/24/2022   Procedure: TRANSURETHRAL RESECTION OF BLADDER TUMOR (TURBT);  Surgeon: Twylla Glendia BROCKS, MD;  Location: [facility];  Service: Urology;  Laterality: N/A;   TRANSURETHRAL RESECTION OF BLADDER TUMOR WITH GYRUS (TURBT-GYRUS)  06/24/2022   TRANSURETHRAL RESECTION OF PROSTATE      Home Medications:  Current Meds  Medication  Sig   acetaminophen  (TYLENOL ) 500 MG tablet Take 500 mg by mouth every 6 (six) hours as needed for moderate pain (pain score 4-6).   ascorbic acid (VITAMIN C) 500 MG tablet Take 500 mg by mouth daily.   aspirin  81 MG tablet Take 81 mg by mouth daily.   atorvastatin  (LIPITOR) 10 MG tablet Take 10 mg by mouth at bedtime.   cetirizine (ZYRTEC) 10 MG tablet Take 10 mg by mouth daily as needed for allergies.   Coenzyme Q10 100 MG capsule Take 100 mg by mouth daily.    feeding supplement (ENSURE ENLIVE / ENSURE PLUS) LIQD Take 237 mLs by mouth 3 (three) times daily between meals.   finasteride  (PROSCAR ) 5 MG tablet Take 1 tablet (5 mg total) by mouth daily.   fluticasone  (FLONASE ) 50 MCG/ACT nasal spray Place 1 spray into both nostrils daily as needed for allergies.   GLUCOSAMINE-CHONDROITIN PO Take 1 tablet by mouth 2 (two) times daily.   Magnesium 200 MG TABS Take 200 mg by mouth 2 (two) times daily.   metoprolol  tartrate (LOPRESSOR ) 25 MG tablet Take 25 mg by mouth 2 (two) times daily.   metroNIDAZOLE (METROGEL) 1 % gel Apply 1 Application topically 2 (two) times daily.   Multiple Vitamin (MULTIVITAMIN) tablet Take 1 tablet by mouth daily.   nitroGLYCERIN  (NITROSTAT ) 0.4 MG SL tablet Place 0.4 mg under the tongue every 5 (five) minutes as needed for chest  pain.   Omega-3 Fatty Acids (FISH OIL) 1000 MG CAPS Take 1,000 mg by mouth daily.   Probiotic Product (MEGA PROBIOTIC) CAPS Take 1 capsule by mouth daily.   silodosin  (RAPAFLO ) 8 MG CAPS capsule TAKE 1 CAPSULE BY MOUTH DAILY WITH BREAKFAST   Vitamin A 2400 MCG (8000 UT) CAPS Take 8,000 Units by mouth daily.   zinc gluconate 50 MG tablet Take 50 mg by mouth daily.    Allergies:  Allergies  Allergen Reactions   Cidex [Glutaral] Anaphylaxis   Other Anaphylaxis    - Ortho-phthalaldehyde  - trospion ([place] ) cause AMS    Family History  Problem Relation Age of Onset   Prostate cancer Father        Metastatic   Cancer Father      Social History:  reports that he quit smoking about 17 years ago. His smoking use included cigarettes. He started smoking about 77 years ago. He has a 45 pack-year smoking history. He has been exposed to tobacco smoke. He has never used smokeless tobacco. He reports current alcohol use of about 2.0 standard drinks of alcohol per week. He reports that he does not use drugs.  ROS: A complete review of systems was performed.  All systems are negative except for pertinent findings as noted.  Physical Exam:  Vital signs in last 24 hours: Temp:  [97.1 F (36.2 C)] 97.1 F (36.2 C) (10/02 0947) Pulse Rate:  [55] 55 (10/02 0947) Resp:  [15] 15 (10/02 0947) BP: (163)/(79) 163/79 (10/02 0947) SpO2:  [96 %] 96 % (10/02 0947) Weight:  [73 kg] 73 kg (10/02 0947) Constitutional:  Alert and oriented, No acute distress HEENT: [place] AT, moist mucus membranes.  Trachea midline, no masses Respiratory: Normal respiratory effort Psychiatric: Normal mood and affect   Laboratory Data:  Recent Labs    11/17/23 1253  WBC 7.2  HGB 11.3*  HCT 35.4*   Recent Labs    11/17/23 1253  NA 138  K 4.4  CL 112*  CO2 19*  GLUCOSE 92  BUN 49*  CREATININE 3.14*  CALCIUM  8.8*   No results for input(s): LABPT, INR in the last 72 hours. No results for input(s): LABURIN in the last 72 hours. Results for orders placed or performed in visit on 11/11/23  CULTURE, URINE COMPREHENSIVE     Status: None   Collection Time: 11/11/23  1:46 PM   Specimen: Urine   UR  Result Value Ref Range Status   Urine Culture, Comprehensive Final report  Final   Organism ID, Bacteria Comment  Final    Comment: No growth in 36 - 48 hours.  Microscopic Examination     Status: Abnormal   Collection Time: 11/11/23  1:46 PM   Urine  Result Value Ref Range Status   WBC, UA 11-30 (A) 0 - 5 /hpf Final   RBC, Urine >30 (A) 0 - 2 /hpf Final   Epithelial Cells (non renal) 0-10 0 - 10 /hpf Final   Bacteria, UA Few None seen/Few  Final       11/19/2023, 11:05 AM  Glendia Barba,  MD

## 2023-11-19 NOTE — Interval H&P Note (Signed)
 History and Physical Interval Note:  11/19/2023 11:16 AM  [name]  has presented today for surgery, with the diagnosis of History of Bladder Cancer, Bilateral Ureteral Obstruction.  The various methods of treatment have been discussed with the patient and family. After consideration of risks, benefits and other options for treatment, the patient has consented to  Procedure(s) with comments: CYSTOSCOPY (N/A) - Diagnostic CYSTOSCOPY, WITH BIOPSY (N/A) - Bladder TURBT (TRANSURETHRAL RESECTION OF BLADDER TUMOR) (N/A) CYSTOSCOPY, WITH RETROGRADE PYELOGRAM (Bilateral) REMOVAL, STENT, URETER, CYSTOSCOPIC (Bilateral) CYSTOSCOPY, FLEXIBLE, WITH STENT REPLACEMENT (Bilateral) as a surgical intervention.  The patient's history has been reviewed, patient examined, no change in status, stable for surgery.  I have reviewed the patient's chart and labs.  Questions were answered to the patient's satisfaction.    CV:RRR Lungs:clear  Glendia JAYSON Barba

## 2023-11-20 ENCOUNTER — Encounter: Payer: Self-pay | Admitting: Urology

## 2023-12-02 ENCOUNTER — Encounter: Payer: Self-pay | Admitting: Oncology

## 2023-12-08 ENCOUNTER — Inpatient Hospital Stay: Attending: Radiation Oncology

## 2023-12-08 ENCOUNTER — Inpatient Hospital Stay

## 2023-12-08 DIAGNOSIS — D631 Anemia in chronic kidney disease: Secondary | ICD-10-CM | POA: Insufficient documentation

## 2023-12-08 DIAGNOSIS — D472 Monoclonal gammopathy: Secondary | ICD-10-CM

## 2023-12-08 DIAGNOSIS — N184 Chronic kidney disease, stage 4 (severe): Secondary | ICD-10-CM | POA: Diagnosis present

## 2023-12-08 LAB — HEMOGLOBIN AND HEMATOCRIT (CANCER CENTER ONLY)
HCT: 34.1 % — ABNORMAL LOW (ref 39.0–52.0)
Hemoglobin: 11.2 g/dL — ABNORMAL LOW (ref 13.0–17.0)

## 2023-12-08 NOTE — Progress Notes (Signed)
 Hgb 11.2 today, no injection given

## 2023-12-29 ENCOUNTER — Inpatient Hospital Stay: Attending: Radiation Oncology

## 2023-12-29 ENCOUNTER — Inpatient Hospital Stay

## 2023-12-29 DIAGNOSIS — D631 Anemia in chronic kidney disease: Secondary | ICD-10-CM

## 2023-12-29 DIAGNOSIS — D472 Monoclonal gammopathy: Secondary | ICD-10-CM

## 2023-12-29 LAB — HEMOGLOBIN AND HEMATOCRIT (CANCER CENTER ONLY)
HCT: 35.3 % — ABNORMAL LOW (ref 39.0–52.0)
Hemoglobin: 11.5 g/dL — ABNORMAL LOW (ref 13.0–17.0)

## 2023-12-29 NOTE — Progress Notes (Signed)
 HbG above a 11. No injection today.

## 2024-01-19 ENCOUNTER — Inpatient Hospital Stay: Attending: Radiation Oncology

## 2024-01-19 ENCOUNTER — Inpatient Hospital Stay

## 2024-01-19 DIAGNOSIS — D631 Anemia in chronic kidney disease: Secondary | ICD-10-CM | POA: Diagnosis present

## 2024-01-19 DIAGNOSIS — D472 Monoclonal gammopathy: Secondary | ICD-10-CM

## 2024-01-19 DIAGNOSIS — N184 Chronic kidney disease, stage 4 (severe): Secondary | ICD-10-CM | POA: Diagnosis present

## 2024-01-19 LAB — CBC WITH DIFFERENTIAL (CANCER CENTER ONLY)
Abs Immature Granulocytes: 0.04 K/uL (ref 0.00–0.07)
Basophils Absolute: 0.1 K/uL (ref 0.0–0.1)
Basophils Relative: 1 %
Eosinophils Absolute: 0.5 K/uL (ref 0.0–0.5)
Eosinophils Relative: 6 %
HCT: 34.5 % — ABNORMAL LOW (ref 39.0–52.0)
Hemoglobin: 11.4 g/dL — ABNORMAL LOW (ref 13.0–17.0)
Immature Granulocytes: 0 %
Lymphocytes Relative: 12 %
Lymphs Abs: 1.1 K/uL (ref 0.7–4.0)
MCH: 35 pg — ABNORMAL HIGH (ref 26.0–34.0)
MCHC: 33 g/dL (ref 30.0–36.0)
MCV: 105.8 fL — ABNORMAL HIGH (ref 80.0–100.0)
Monocytes Absolute: 0.7 K/uL (ref 0.1–1.0)
Monocytes Relative: 8 %
Neutro Abs: 6.9 K/uL (ref 1.7–7.7)
Neutrophils Relative %: 73 %
Platelet Count: 156 K/uL (ref 150–400)
RBC: 3.26 MIL/uL — ABNORMAL LOW (ref 4.22–5.81)
RDW: 13.2 % (ref 11.5–15.5)
WBC Count: 9.3 K/uL (ref 4.0–10.5)
nRBC: 0 % (ref 0.0–0.2)

## 2024-01-19 LAB — IRON AND TIBC
Iron: 102 ug/dL (ref 45–182)
Saturation Ratios: 35 % (ref 17.9–39.5)
TIBC: 293 ug/dL (ref 250–450)
UIBC: 191 ug/dL

## 2024-01-19 LAB — CMP (CANCER CENTER ONLY)
ALT: 20 U/L (ref 0–44)
AST: 28 U/L (ref 15–41)
Albumin: 4.6 g/dL (ref 3.5–5.0)
Alkaline Phosphatase: 78 U/L (ref 38–126)
Anion gap: 10 (ref 5–15)
BUN: 48 mg/dL — ABNORMAL HIGH (ref 8–23)
CO2: 20 mmol/L — ABNORMAL LOW (ref 22–32)
Calcium: 9.3 mg/dL (ref 8.9–10.3)
Chloride: 111 mmol/L (ref 98–111)
Creatinine: 3 mg/dL — ABNORMAL HIGH (ref 0.61–1.24)
GFR, Estimated: 20 mL/min — ABNORMAL LOW (ref >60)
Glucose, Bld: 96 mg/dL (ref 70–99)
Potassium: 4.9 mmol/L (ref 3.5–5.1)
Sodium: 141 mmol/L (ref 135–145)
Total Bilirubin: 0.5 mg/dL (ref 0.0–1.2)
Total Protein: 8.2 g/dL — ABNORMAL HIGH (ref 6.5–8.1)

## 2024-01-19 LAB — FERRITIN: Ferritin: 202 ng/mL (ref 24–336)

## 2024-01-19 NOTE — Progress Notes (Signed)
 Hgb is 11.4 today; per parameters hold aranesp if hgb is between 10-11

## 2024-01-20 LAB — PROTEIN ELECTROPHORESIS, SERUM
A/G Ratio: 1.2 (ref 0.7–1.7)
Albumin ELP: 4.3 g/dL (ref 2.9–4.4)
Alpha-1-Globulin: 0.1 g/dL (ref 0.0–0.4)
Alpha-2-Globulin: 0.6 g/dL (ref 0.4–1.0)
Beta Globulin: 0.7 g/dL (ref 0.7–1.3)
Gamma Globulin: 2 g/dL — ABNORMAL HIGH (ref 0.4–1.8)
Globulin, Total: 3.5 g/dL (ref 2.2–3.9)
M-Spike, %: 1.4 g/dL — ABNORMAL HIGH
Total Protein ELP: 7.8 g/dL (ref 6.0–8.5)

## 2024-01-20 LAB — KAPPA/LAMBDA LIGHT CHAINS
Kappa free light chain: 24.3 mg/L — ABNORMAL HIGH (ref 3.3–19.4)
Kappa, lambda light chain ratio: 0.07 — ABNORMAL LOW (ref 0.26–1.65)
Lambda free light chains: 338.9 mg/L — ABNORMAL HIGH (ref 5.7–26.3)

## 2024-01-26 LAB — MULTIPLE MYELOMA PANEL, SERUM
Albumin SerPl Elph-Mcnc: 4.3 g/dL (ref 2.9–4.4)
Albumin/Glob SerPl: 1.3 (ref 0.7–1.7)
Alpha 1: 0.1 g/dL (ref 0.0–0.4)
Alpha2 Glob SerPl Elph-Mcnc: 0.6 g/dL (ref 0.4–1.0)
B-Globulin SerPl Elph-Mcnc: 0.7 g/dL (ref 0.7–1.3)
Gamma Glob SerPl Elph-Mcnc: 1.9 g/dL — ABNORMAL HIGH (ref 0.4–1.8)
Globulin, Total: 3.4 g/dL (ref 2.2–3.9)
IgA: 44 mg/dL — ABNORMAL LOW (ref 61–437)
IgG (Immunoglobin G), Serum: 2299 mg/dL — ABNORMAL HIGH (ref 603–1613)
IgM (Immunoglobulin M), Srm: 42 mg/dL (ref 15–143)
M Protein SerPl Elph-Mcnc: 1.5 g/dL — ABNORMAL HIGH
Total Protein ELP: 7.7 g/dL (ref 6.0–8.5)

## 2024-02-08 ENCOUNTER — Other Ambulatory Visit: Payer: Self-pay | Admitting: *Deleted

## 2024-02-08 DIAGNOSIS — D472 Monoclonal gammopathy: Secondary | ICD-10-CM

## 2024-02-08 DIAGNOSIS — N184 Chronic kidney disease, stage 4 (severe): Secondary | ICD-10-CM

## 2024-02-09 ENCOUNTER — Inpatient Hospital Stay

## 2024-02-09 DIAGNOSIS — D631 Anemia in chronic kidney disease: Secondary | ICD-10-CM

## 2024-02-09 DIAGNOSIS — D472 Monoclonal gammopathy: Secondary | ICD-10-CM

## 2024-02-09 DIAGNOSIS — N184 Chronic kidney disease, stage 4 (severe): Secondary | ICD-10-CM | POA: Diagnosis not present

## 2024-02-09 LAB — CBC WITH DIFFERENTIAL/PLATELET
Abs Immature Granulocytes: 0.04 K/uL (ref 0.00–0.07)
Basophils Absolute: 0.1 K/uL (ref 0.0–0.1)
Basophils Relative: 1 %
Eosinophils Absolute: 0.6 K/uL — ABNORMAL HIGH (ref 0.0–0.5)
Eosinophils Relative: 7 %
HCT: 34 % — ABNORMAL LOW (ref 39.0–52.0)
Hemoglobin: 11.3 g/dL — ABNORMAL LOW (ref 13.0–17.0)
Immature Granulocytes: 1 %
Lymphocytes Relative: 13 %
Lymphs Abs: 1.1 K/uL (ref 0.7–4.0)
MCH: 35.2 pg — ABNORMAL HIGH (ref 26.0–34.0)
MCHC: 33.2 g/dL (ref 30.0–36.0)
MCV: 105.9 fL — ABNORMAL HIGH (ref 80.0–100.0)
Monocytes Absolute: 0.8 K/uL (ref 0.1–1.0)
Monocytes Relative: 9 %
Neutro Abs: 6.1 K/uL (ref 1.7–7.7)
Neutrophils Relative %: 69 %
Platelets: 182 K/uL (ref 150–400)
RBC: 3.21 MIL/uL — ABNORMAL LOW (ref 4.22–5.81)
RDW: 12.9 % (ref 11.5–15.5)
WBC: 8.7 K/uL (ref 4.0–10.5)
nRBC: 0 % (ref 0.0–0.2)

## 2024-02-09 LAB — CMP (CANCER CENTER ONLY)
ALT: 22 U/L (ref 0–44)
AST: 31 U/L (ref 15–41)
Albumin: 4.5 g/dL (ref 3.5–5.0)
Alkaline Phosphatase: 78 U/L (ref 38–126)
Anion gap: 12 (ref 5–15)
BUN: 46 mg/dL — ABNORMAL HIGH (ref 8–23)
CO2: 19 mmol/L — ABNORMAL LOW (ref 22–32)
Calcium: 9 mg/dL (ref 8.9–10.3)
Chloride: 108 mmol/L (ref 98–111)
Creatinine: 2.93 mg/dL — ABNORMAL HIGH (ref 0.61–1.24)
GFR, Estimated: 21 mL/min — ABNORMAL LOW
Glucose, Bld: 104 mg/dL — ABNORMAL HIGH (ref 70–99)
Potassium: 4.9 mmol/L (ref 3.5–5.1)
Sodium: 139 mmol/L (ref 135–145)
Total Bilirubin: 0.5 mg/dL (ref 0.0–1.2)
Total Protein: 7.8 g/dL (ref 6.5–8.1)

## 2024-02-09 LAB — IRON AND TIBC
Iron: 99 ug/dL (ref 45–182)
Saturation Ratios: 36 % (ref 17.9–39.5)
TIBC: 274 ug/dL (ref 250–450)
UIBC: 176 ug/dL

## 2024-02-09 LAB — FERRITIN: Ferritin: 182 ng/mL (ref 24–336)

## 2024-02-09 NOTE — Progress Notes (Signed)
 Hgb is 11.3 today; per parameters hold aranesp if hgb is between 10-11

## 2024-02-10 LAB — PROTEIN ELECTROPHORESIS, SERUM
A/G Ratio: 1.1 (ref 0.7–1.7)
Albumin ELP: 4 g/dL (ref 2.9–4.4)
Alpha-1-Globulin: 0.2 g/dL (ref 0.0–0.4)
Alpha-2-Globulin: 0.7 g/dL (ref 0.4–1.0)
Beta Globulin: 0.8 g/dL (ref 0.7–1.3)
Gamma Globulin: 1.8 g/dL (ref 0.4–1.8)
Globulin, Total: 3.5 g/dL (ref 2.2–3.9)
M-Spike, %: 1.2 g/dL — ABNORMAL HIGH
Total Protein ELP: 7.5 g/dL (ref 6.0–8.5)

## 2024-02-10 LAB — KAPPA/LAMBDA LIGHT CHAINS
Kappa free light chain: 24.2 mg/L — ABNORMAL HIGH (ref 3.3–19.4)
Kappa, lambda light chain ratio: 0.08 — ABNORMAL LOW (ref 0.26–1.65)
Lambda free light chains: 297.3 mg/L — ABNORMAL HIGH (ref 5.7–26.3)

## 2024-02-19 ENCOUNTER — Ambulatory Visit: Admitting: Urology

## 2024-02-23 ENCOUNTER — Encounter: Payer: Self-pay | Admitting: Urology

## 2024-02-23 ENCOUNTER — Ambulatory Visit: Admitting: Urology

## 2024-02-23 VITALS — BP 143/72 | HR 75 | Ht 67.0 in | Wt 172.0 lb

## 2024-02-23 DIAGNOSIS — Z8546 Personal history of malignant neoplasm of prostate: Secondary | ICD-10-CM

## 2024-02-23 DIAGNOSIS — N1330 Unspecified hydronephrosis: Secondary | ICD-10-CM | POA: Diagnosis not present

## 2024-02-23 DIAGNOSIS — N3942 Incontinence without sensory awareness: Secondary | ICD-10-CM

## 2024-02-23 DIAGNOSIS — Z8551 Personal history of malignant neoplasm of bladder: Secondary | ICD-10-CM

## 2024-02-23 NOTE — Progress Notes (Signed)
 "  02/23/2024 5:51 PM   [name] 02-Jul-1945 [id number]  Referring provider: Rudolpho Norleen BIRCH, MD [address] [facility] [address] [facility] [place],  [place] [address]  Chief Complaint  Patient presents with   Other   Urological history: 1. Recurrent Ta urothelial carcinoma bladder TURBT July 2011; low-grade Ta TURBT December 2014; low-grade Ta; postresection mitomycin TURBT 07/2015; low-grade Ta; intravesical gemcitabine  x6 weeks TURBT 10/2020;Ta low-grade urothelial carcinoma with small focus suggestive of high-grade features; repeat intravesical gemcitabine  6 completed 01/07/2021 Bx w/ fulguration 07/17/2021; Ta low grade  Bx wl fulguration 05/26/2023; no tumor, inflamed/edematous urothelial mucosa with prominent plasma cell infiltrate   2.  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  Radiation cystitis hyperbaric oxygen therapy  4.  Bilateral hydronephrosis with AKI Found to have bilateral distal ureteral strictures most likely secondary to prior radiation Right ureteral stent placed 05/26/23; left UO could not be identified and initially underwent placement left PCN and subsequently converted to indwelling ureteral stent  5.  Urinary incontinence Most likely sphincteric incontinence secondary to prior radiation and noncompliant bladder  HPI: [name] is a 79 y.o. male presents for 70-month follow-up.  No significant changes since last visit Still with incontinence and has been doing Kegel exercises Renal function stable with GFR ~46   PMH: Past Medical History:  Diagnosis Date   Anemia in stage 4 chronic kidney disease (HCC)    Aneurysm of infrarenal abdominal aorta    a.) CTA AP 07/28/2019: measured 3.0 cm   Aneurysm of left common iliac artery    a.) CTA AP 07/28/2019: measured 1.6 cm   Aneurysm of right common carotid artery    a.) CTA AP 07/28/2019: measured 2.1 cm   Anginal pain    Aortic atherosclerosis     Benign prostatic hyperplasia    Bifascicular block (RBBB + LAFB)    Bilateral renal cysts    Bladder cancer (HCC)    Chronic kidney disease, stage IV (severe) (HCC)    Coronary artery disease    a.) LHC 04/12/2002 --> LVEF 42%; severe 3v CAD. 4v CABG on 04/13/2002. b.) LHC 02/03/2013 --> LVEF 50%; severe 3v CAD with patent LIMA-LAD and SVG-OM1 grafts; SVG-PDA and SVG-DG1 occluded with collaterals to PDA and DG1 from LAD.   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   a.) LIMA-LAD, SVG-OM1, SVG-D1, SVG-PDA   Smoldering multiple myeloma    Tubular adenoma of colon 02/03/2007    Surgical History: Past Surgical History:  Procedure Laterality Date   CARDIAC CATHETERIZATION Left 04/13/2002   Procedure: CARDIAC CATHETERIZATION; Location: [facility]; Surgeon; Marsa Dooms, MD   CARDIAC CATHETERIZATION Left 02/03/2013   Procedure: CARDIAC CATHETERIZATION; Location: [facility]; Surgeon: Marsa Dooms, MD   CATARACT EXTRACTION W/ INTRAOCULAR LENS IMPLANT Bilateral 2021   COLONOSCOPY W/ POLYPECTOMY     COLONOSCOPY WITH PROPOFOL  N/A 04/09/2015   Procedure: COLONOSCOPY WITH PROPOFOL ;  Surgeon: Deward CINDERELLA Piedmont, MD;  Location: [facility] ENDOSCOPY;  Service: Gastroenterology;  Laterality: N/A;   CORONARY ANGIOPLASTY     CORONARY ARTERY BYPASS GRAFT N/A 04/13/2002   Procedure: 4v CABG (LIMA-LAD, SVG-OM1, SVG-D1, SVG-PDA); Location:  [facility]; Surgeon: Marget, MD   CYSTOSCOPY N/A 07/17/2021   Procedure: PHYLLIS;  Surgeon: Twylla Glendia BROCKS, MD;  Location: [facility] ORS;  Service: Urology;  Laterality: N/A;   CYSTOSCOPY N/A 06/24/2022   Procedure: DIAGNOSTIC CYSTOSCOPY;  Surgeon: Twylla Glendia BROCKS, MD;  Location: [facility] ORS;   Service: Urology;  Laterality: N/A;   CYSTOSCOPY W/ RETROGRADES Bilateral 05/26/2023   Procedure: CYSTOSCOPY, WITH RETROGRADE PYELOGRAM;  Surgeon: Twylla Glendia BROCKS, MD;  Location: [facility] ORS;  Service: Urology;  Laterality: Bilateral;  right stent placement, bladder biopsy   CYSTOSCOPY W/ RETROGRADES Bilateral 11/19/2023   Procedure: CYSTOSCOPY, WITH RETROGRADE PYELOGRAM;  Surgeon: Twylla Glendia BROCKS, MD;  Location: [facility] OR[facility];  Service: Urology;  Laterality: Bilateral;   CYSTOSCOPY W/ URETERAL STENT PLACEMENT Bilateral 11/19/2023   Procedure: CYSTOSCOPY, FLEXIBLE, WITH STENT REPLACEMENT;  Surgeon: Twylla Glendia BROCKS, MD;  Location: [facility];  Service: Urology;  Laterality: Bilateral;   CYSTOSCOPY WITH BIOPSY N/A 07/17/2021   Procedure: CYSTOSCOPY WITH BIOPSY;  Surgeon: Twylla Glendia BROCKS, MD;  Location: [facility];  Service: Urology;  Laterality: N/A;   CYSTOSCOPY WITH BIOPSY N/A 06/24/2022   Procedure: CYSTOSCOPY WITH BLADDER BIOPSY;  Surgeon: Twylla Glendia BROCKS, MD;  Location: [facility];  Service: Urology;  Laterality: N/A;   CYSTOSCOPY WITH FULGERATION N/A 07/17/2021   Procedure: CYSTOSCOPY WITH FULGERATION;  Surgeon: Twylla Glendia BROCKS, MD;  Location: [facility];  Service: Urology;  Laterality: N/A;   CYSTOSCOPY WITH FULGERATION N/A 06/24/2022   Procedure: CYSTOSCOPY WITH FULGERATION;  Surgeon: Twylla Glendia BROCKS, MD;  Location: [facility];  Service: Urology;  Laterality: N/A;   IR BONE MARROW BIOPSY & ASPIRATION  12/17/2022   IR NEPHROSTOMY PLACEMENT LEFT  06/10/2023   IR RADIOLOGIST EVAL & MGMT  06/10/2023   IR URETERAL STENT PLACEMENT EXISTING ACCESS LEFT  06/17/2023   TONSILLECTOMY     TRANSURETHRAL RESECTION OF BLADDER TUMOR N/A 11/13/2020   Procedure: TRANSURETHRAL RESECTION OF BLADDER TUMOR (TURBT);  Surgeon: Twylla Glendia BROCKS, MD;  Location: [facility];  Service: Urology;  Laterality: N/A;   TRANSURETHRAL RESECTION OF BLADDER TUMOR N/A 06/24/2022   Procedure: TRANSURETHRAL RESECTION OF BLADDER TUMOR (TURBT);   Surgeon: Twylla Glendia BROCKS, MD;  Location: [facility];  Service: Urology;  Laterality: N/A;   TRANSURETHRAL RESECTION OF BLADDER TUMOR WITH GYRUS (TURBT-GYRUS)  06/24/2022   TRANSURETHRAL RESECTION OF PROSTATE      Home Medications:  Allergies as of 02/23/2024       Reactions   Cidex [glutaral] Anaphylaxis   Other Anaphylaxis   - Ortho-phthalaldehyde  - trospion (Sanctura ) cause AMS        Medication List        Accurate as of February 23, 2024  5:51 PM. If you have any questions, ask your nurse or doctor.          acetaminophen  500 MG tablet Commonly known as: TYLENOL  Take 500 mg by mouth every 6 (six) hours as needed for moderate pain (pain score 4-6).   ascorbic acid 500 MG tablet Commonly known as: VITAMIN C Take 500 mg by mouth daily.   aspirin  81 MG tablet Take 81 mg by mouth daily.   atorvastatin  10 MG tablet Commonly known as: LIPITOR Take 10 mg by mouth at bedtime.   cetirizine 10 MG tablet Commonly known as: ZYRTEC Take 10 mg by mouth daily as needed for allergies.   Coenzyme Q10 100 MG capsule Take 100 mg by mouth daily.   feeding supplement Liqd Take 237 mLs by mouth 3 (  three) times daily between meals.   finasteride  5 MG tablet Commonly known as: PROSCAR  Take 1 tablet (5 mg total) by mouth daily.   Fish Oil 1000 MG Caps Take 1,000 mg by mouth daily.   fluticasone  50 MCG/ACT nasal spray Commonly known as: FLONASE  Place 1 spray into both nostrils daily as needed for allergies.   GLUCOSAMINE-CHONDROITIN PO Take 1 tablet by mouth 2 (two) times daily.   Magnesium 200 MG Tabs Take 200 mg by mouth 2 (two) times daily.   Mega Probiotic Caps Take 1 capsule by mouth daily.   metoprolol  tartrate 25 MG tablet Commonly known as: LOPRESSOR  Take 25 mg by mouth 2 (two) times daily.   metroNIDAZOLE 1 % gel Commonly known as: METROGEL Apply 1 Application topically 2 (two) times daily.   multivitamin tablet Take 1 tablet by mouth daily.    nitroGLYCERIN  0.4 MG SL tablet Commonly known as: NITROSTAT  Place 0.4 mg under the tongue every 5 (five) minutes as needed for chest pain.   silodosin  8 MG Caps capsule Commonly known as: RAPAFLO  TAKE 1 CAPSULE BY MOUTH DAILY WITH BREAKFAST   Vitamin A 2400 MCG (8000 UT) Caps Take 8,000 Units by mouth daily.   zinc gluconate 50 MG tablet Take 50 mg by mouth daily.        Allergies: Allergies[1]  Family History: Family History  Problem Relation Age of Onset   Prostate cancer Father        Metastatic   Cancer Father     Social History:  reports that he quit smoking about 18 years ago. His smoking use included cigarettes. He started smoking about 78 years ago. He has a 45 pack-year smoking history. He has been exposed to tobacco smoke. He has never used smokeless tobacco. He reports current alcohol use of about 2.0 standard drinks of alcohol per week. He reports that he does not use drugs.   Physical Exam: BP (!) 143/72   Pulse 75   Ht 5' 7 (1.702 m)   Wt 172 lb (78 kg)   BMI 26.94 kg/m   Constitutional:  Alert, No acute distress. HEENT: [place] AT Respiratory: Normal respiratory effort, no increased work of breathing. Psychiatric: Normal mood and affect.   Assessment & Plan:    1.  Bilateral hydronephrosis Schedule cystoscopy with bilateral retrograde pyelograms and stent exchange versus removal in early April 2026  2.  History urothelial carcinoma bladder Surveillance cystoscopy at time of stent exchange/removal  3.  Urinary incontinence Referral for pelvic floor PT placed  4.  History prostate cancer PSA September 2025 was undetectable PSA check March 2026   Glendia JAYSON Barba, MD  [facility] [address], [address] [place], [place] [address] [phone]    [1]  Allergies Allergen Reactions   Cidex [Glutaral] Anaphylaxis   Other Anaphylaxis    - Ortho-phthalaldehyde  - trospion (Sanctura ) cause AMS   "

## 2024-03-01 ENCOUNTER — Inpatient Hospital Stay

## 2024-03-01 ENCOUNTER — Encounter: Payer: Self-pay | Admitting: Oncology

## 2024-03-01 ENCOUNTER — Inpatient Hospital Stay: Attending: Radiation Oncology

## 2024-03-01 DIAGNOSIS — D472 Monoclonal gammopathy: Secondary | ICD-10-CM

## 2024-03-01 DIAGNOSIS — N184 Chronic kidney disease, stage 4 (severe): Secondary | ICD-10-CM | POA: Diagnosis present

## 2024-03-01 DIAGNOSIS — D631 Anemia in chronic kidney disease: Secondary | ICD-10-CM | POA: Diagnosis present

## 2024-03-01 LAB — HEMOGLOBIN AND HEMATOCRIT (CANCER CENTER ONLY)
HCT: 36.2 % — ABNORMAL LOW (ref 39.0–52.0)
Hemoglobin: 11.5 g/dL — ABNORMAL LOW (ref 13.0–17.0)

## 2024-03-01 NOTE — Progress Notes (Signed)
 Hgb 11.5 today, no inj given

## 2024-03-04 ENCOUNTER — Encounter: Payer: Self-pay | Admitting: Urology

## 2024-03-04 ENCOUNTER — Encounter: Payer: Self-pay | Admitting: Oncology

## 2024-03-06 ENCOUNTER — Encounter: Payer: Self-pay | Admitting: Urology

## 2024-03-07 ENCOUNTER — Encounter: Payer: Self-pay | Admitting: Oncology

## 2024-03-07 ENCOUNTER — Other Ambulatory Visit: Payer: Self-pay | Admitting: *Deleted

## 2024-03-07 MED ORDER — ALFUZOSIN HCL ER 10 MG PO TB24: 10.0000 mg | ORAL_TABLET | Freq: Every day | ORAL | 3 refills | Status: AC

## 2024-03-07 NOTE — Telephone Encounter (Signed)
 This is for prescription only (Non-Medical)

## 2024-03-22 ENCOUNTER — Inpatient Hospital Stay

## 2024-03-22 ENCOUNTER — Inpatient Hospital Stay: Attending: Radiation Oncology

## 2024-03-22 DIAGNOSIS — D472 Monoclonal gammopathy: Secondary | ICD-10-CM

## 2024-03-22 DIAGNOSIS — D631 Anemia in chronic kidney disease: Secondary | ICD-10-CM

## 2024-03-22 LAB — HEMOGLOBIN AND HEMATOCRIT (CANCER CENTER ONLY)
HCT: 34.6 % — ABNORMAL LOW (ref 39.0–52.0)
Hemoglobin: 11 g/dL — ABNORMAL LOW (ref 13.0–17.0)

## 2024-04-12 ENCOUNTER — Ambulatory Visit

## 2024-04-12 ENCOUNTER — Other Ambulatory Visit

## 2024-04-13 ENCOUNTER — Inpatient Hospital Stay

## 2024-05-03 ENCOUNTER — Ambulatory Visit

## 2024-05-03 ENCOUNTER — Other Ambulatory Visit

## 2024-05-10 ENCOUNTER — Ambulatory Visit: Admitting: Oncology

## 2024-05-16 ENCOUNTER — Other Ambulatory Visit
